# Patient Record
Sex: Female | Born: 1970 | Race: White | Hispanic: No | Marital: Married | Smoking: Never smoker
Health system: Southern US, Community
[De-identification: ages and names within clinical notes are randomized; demographics above are authoritative.]

## PROBLEM LIST (undated history)

## (undated) DIAGNOSIS — I340 Nonrheumatic mitral (valve) insufficiency: Secondary | ICD-10-CM

## (undated) DIAGNOSIS — K5900 Constipation, unspecified: Secondary | ICD-10-CM

## (undated) DIAGNOSIS — M7989 Other specified soft tissue disorders: Secondary | ICD-10-CM

## (undated) DIAGNOSIS — K219 Gastro-esophageal reflux disease without esophagitis: Secondary | ICD-10-CM

## (undated) DIAGNOSIS — R0602 Shortness of breath: Secondary | ICD-10-CM

## (undated) DIAGNOSIS — R0989 Other specified symptoms and signs involving the circulatory and respiratory systems: Secondary | ICD-10-CM

## (undated) DIAGNOSIS — C801 Malignant (primary) neoplasm, unspecified: Secondary | ICD-10-CM

## (undated) DIAGNOSIS — R0982 Postnasal drip: Secondary | ICD-10-CM

## (undated) DIAGNOSIS — I219 Acute myocardial infarction, unspecified: Secondary | ICD-10-CM

## (undated) DIAGNOSIS — G3781 Myelin oligodendrocyte glycoprotein antibody disease: Secondary | ICD-10-CM

## (undated) DIAGNOSIS — N979 Female infertility, unspecified: Secondary | ICD-10-CM

## (undated) DIAGNOSIS — H9190 Unspecified hearing loss, unspecified ear: Secondary | ICD-10-CM

## (undated) DIAGNOSIS — R609 Edema, unspecified: Secondary | ICD-10-CM

## (undated) DIAGNOSIS — I890 Lymphedema, not elsewhere classified: Secondary | ICD-10-CM

## (undated) DIAGNOSIS — H469 Unspecified optic neuritis: Secondary | ICD-10-CM

## (undated) DIAGNOSIS — M2550 Pain in unspecified joint: Secondary | ICD-10-CM

## (undated) DIAGNOSIS — E063 Autoimmune thyroiditis: Secondary | ICD-10-CM

## (undated) DIAGNOSIS — G378 Other specified demyelinating diseases of central nervous system: Secondary | ICD-10-CM

## (undated) DIAGNOSIS — Z8719 Personal history of other diseases of the digestive system: Secondary | ICD-10-CM

## (undated) DIAGNOSIS — F32A Depression, unspecified: Secondary | ICD-10-CM

## (undated) DIAGNOSIS — R12 Heartburn: Secondary | ICD-10-CM

## (undated) DIAGNOSIS — E039 Hypothyroidism, unspecified: Secondary | ICD-10-CM

## (undated) DIAGNOSIS — T7840XA Allergy, unspecified, initial encounter: Secondary | ICD-10-CM

## (undated) DIAGNOSIS — Z923 Personal history of irradiation: Secondary | ICD-10-CM

## (undated) DIAGNOSIS — Z973 Presence of spectacles and contact lenses: Secondary | ICD-10-CM

## (undated) DIAGNOSIS — Z9889 Other specified postprocedural states: Secondary | ICD-10-CM

## (undated) DIAGNOSIS — E538 Deficiency of other specified B group vitamins: Secondary | ICD-10-CM

## (undated) DIAGNOSIS — M549 Dorsalgia, unspecified: Secondary | ICD-10-CM

## (undated) DIAGNOSIS — R112 Nausea with vomiting, unspecified: Secondary | ICD-10-CM

## (undated) DIAGNOSIS — R1310 Dysphagia, unspecified: Secondary | ICD-10-CM

## (undated) DIAGNOSIS — C50919 Malignant neoplasm of unspecified site of unspecified female breast: Secondary | ICD-10-CM

## (undated) HISTORY — DX: Other specified postprocedural states: Z98.890

## (undated) HISTORY — PX: BREAST LUMPECTOMY: SHX2

## (undated) HISTORY — DX: Heartburn: R12

## (undated) HISTORY — PX: ABDOMINAL HYSTERECTOMY: SHX81

## (undated) HISTORY — DX: Deficiency of other specified B group vitamins: E53.8

## (undated) HISTORY — DX: Dysphagia, unspecified: R13.10

## (undated) HISTORY — DX: Autoimmune thyroiditis: E06.3

## (undated) HISTORY — DX: Depression, unspecified: F32.A

## (undated) HISTORY — DX: Nonrheumatic mitral (valve) insufficiency: I34.0

## (undated) HISTORY — DX: Shortness of breath: R06.02

## (undated) HISTORY — DX: Unspecified hearing loss, unspecified ear: H91.90

## (undated) HISTORY — DX: Other specified soft tissue disorders: M79.89

## (undated) HISTORY — DX: Pain in unspecified joint: M25.50

## (undated) HISTORY — PX: WISDOM TOOTH EXTRACTION: SHX21

## (undated) HISTORY — DX: Gastro-esophageal reflux disease without esophagitis: K21.9

## (undated) HISTORY — DX: Dorsalgia, unspecified: M54.9

## (undated) HISTORY — DX: Postnasal drip: R09.82

## (undated) HISTORY — DX: Other specified symptoms and signs involving the circulatory and respiratory systems: R09.89

## (undated) HISTORY — DX: Nausea with vomiting, unspecified: R11.2

## (undated) HISTORY — PX: OTHER SURGICAL HISTORY: SHX169

## (undated) HISTORY — DX: Constipation, unspecified: K59.00

## (undated) HISTORY — DX: Female infertility, unspecified: N97.9

## (undated) HISTORY — PX: BREAST SURGERY: SHX581

## (undated) HISTORY — DX: Allergy, unspecified, initial encounter: T78.40XA

## (undated) HISTORY — DX: Acute myocardial infarction, unspecified: I21.9

---

## 2012-02-13 HISTORY — PX: TUBAL LIGATION: SHX77

## 2017-03-17 ENCOUNTER — Other Ambulatory Visit: Payer: Self-pay | Admitting: Internal Medicine

## 2017-03-17 DIAGNOSIS — Z1231 Encounter for screening mammogram for malignant neoplasm of breast: Secondary | ICD-10-CM

## 2017-04-22 ENCOUNTER — Ambulatory Visit
Admission: RE | Admit: 2017-04-22 | Discharge: 2017-04-22 | Disposition: A | Discharge status: Home / Self Care | Payer: PRIVATE HEALTH INSURANCE | Source: Ambulatory Visit | Attending: Internal Medicine | Admitting: Internal Medicine

## 2017-04-22 DIAGNOSIS — Z1231 Encounter for screening mammogram for malignant neoplasm of breast: Secondary | ICD-10-CM

## 2017-04-23 ENCOUNTER — Other Ambulatory Visit: Payer: Self-pay | Admitting: Internal Medicine

## 2017-04-23 DIAGNOSIS — R928 Other abnormal and inconclusive findings on diagnostic imaging of breast: Secondary | ICD-10-CM

## 2017-04-28 ENCOUNTER — Ambulatory Visit
Admission: RE | Admit: 2017-04-28 | Discharge: 2017-04-28 | Disposition: A | Discharge status: Home / Self Care | Payer: PRIVATE HEALTH INSURANCE | Source: Ambulatory Visit | Attending: Internal Medicine | Admitting: Internal Medicine

## 2017-04-28 ENCOUNTER — Ambulatory Visit: Payer: PRIVATE HEALTH INSURANCE

## 2017-04-28 DIAGNOSIS — R928 Other abnormal and inconclusive findings on diagnostic imaging of breast: Secondary | ICD-10-CM

## 2017-06-02 ENCOUNTER — Other Ambulatory Visit: Payer: Self-pay | Admitting: Internal Medicine

## 2017-06-02 ENCOUNTER — Other Ambulatory Visit (HOSPITAL_COMMUNITY)
Admission: RE | Admit: 2017-06-02 | Discharge: 2017-06-02 | Disposition: A | Discharge status: Home / Self Care | Payer: PRIVATE HEALTH INSURANCE | Source: Ambulatory Visit | Attending: Internal Medicine | Admitting: Internal Medicine

## 2017-06-02 DIAGNOSIS — Z124 Encounter for screening for malignant neoplasm of cervix: Secondary | ICD-10-CM | POA: Diagnosis present

## 2017-06-04 LAB — CYTOLOGY - PAP
Chlamydia: NEGATIVE
DIAGNOSIS: NEGATIVE
Neisseria Gonorrhea: NEGATIVE

## 2018-04-15 ENCOUNTER — Other Ambulatory Visit: Payer: Self-pay | Admitting: Internal Medicine

## 2018-04-15 DIAGNOSIS — Z1231 Encounter for screening mammogram for malignant neoplasm of breast: Secondary | ICD-10-CM

## 2018-05-13 ENCOUNTER — Ambulatory Visit
Admission: RE | Admit: 2018-05-13 | Discharge: 2018-05-13 | Disposition: A | Discharge status: Home / Self Care | Payer: PRIVATE HEALTH INSURANCE | Source: Ambulatory Visit | Attending: Internal Medicine | Admitting: Internal Medicine

## 2018-05-13 DIAGNOSIS — Z1231 Encounter for screening mammogram for malignant neoplasm of breast: Secondary | ICD-10-CM

## 2018-05-13 IMAGING — MG DIGITAL SCREENING BILATERAL MAMMOGRAM WITH TOMO AND CAD
8 series · 9 of 24 positions shown · non-contrast
Comparison: Previous exam(s).

CLINICAL DATA: Screening.

EXAM:
DIGITAL SCREENING BILATERAL MAMMOGRAM WITH TOMO AND CAD

[R CC synth-2D]
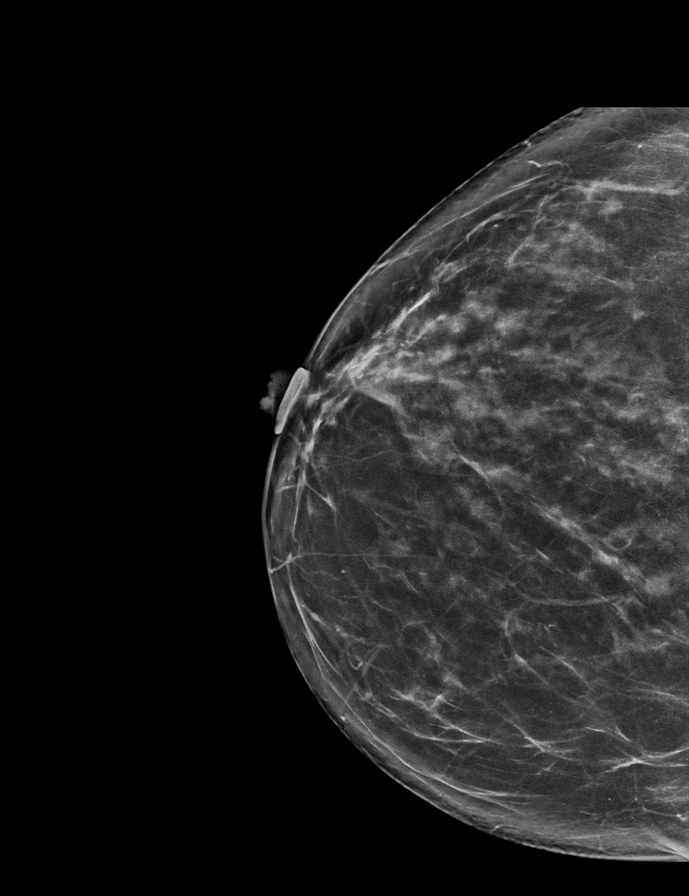

[R MLO synth-2D]
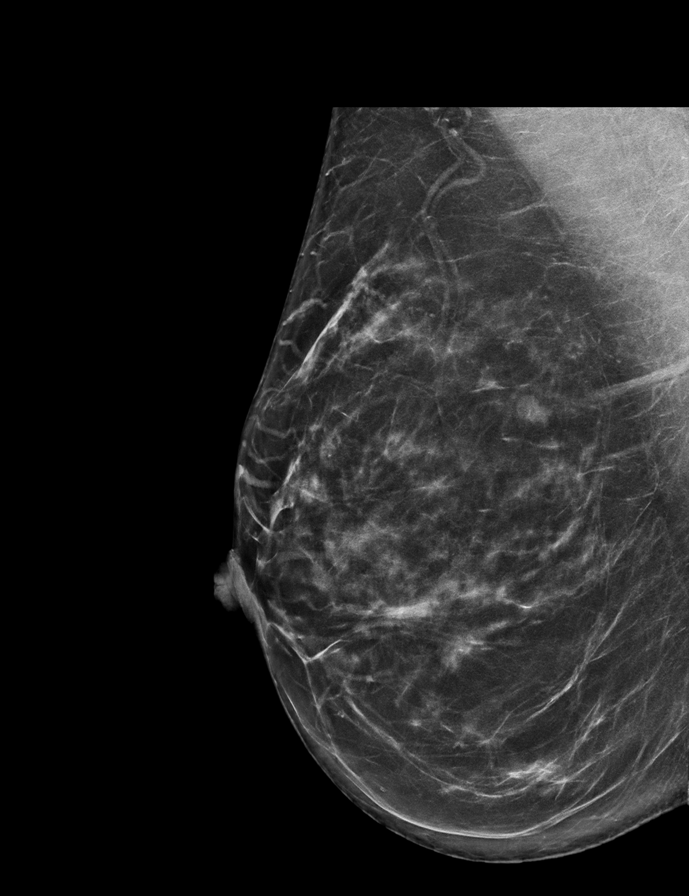

[L MLO synth-2D]
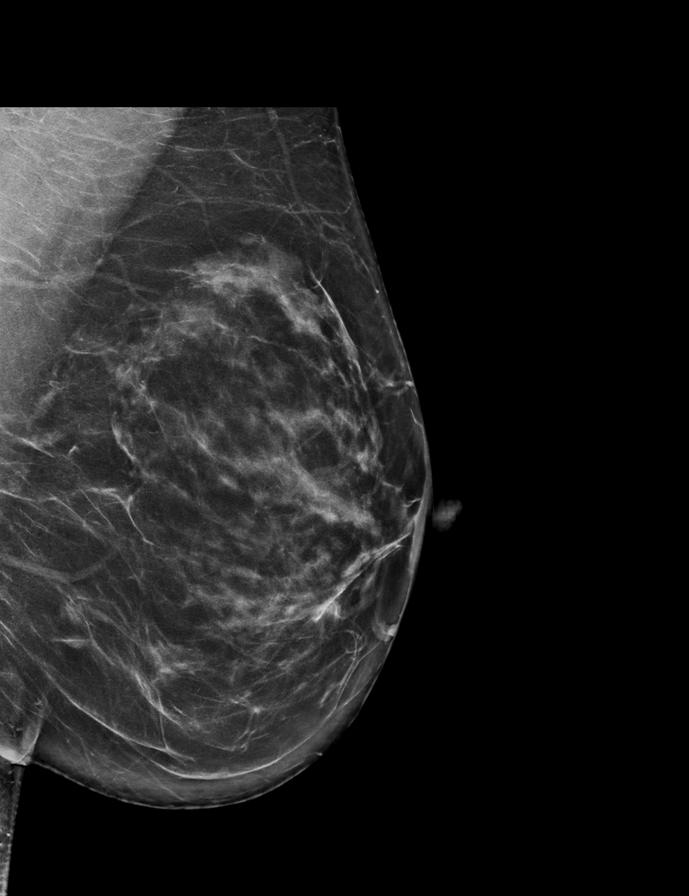

[L CC synth-2D]
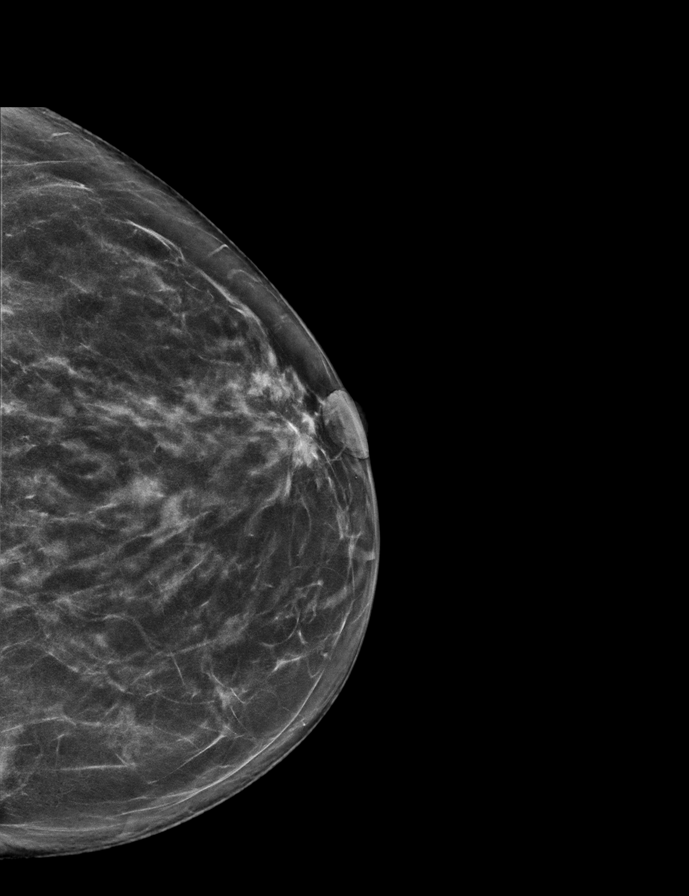

[L CC tomo · 2 of 66 frames shown]
[frame 22/66]
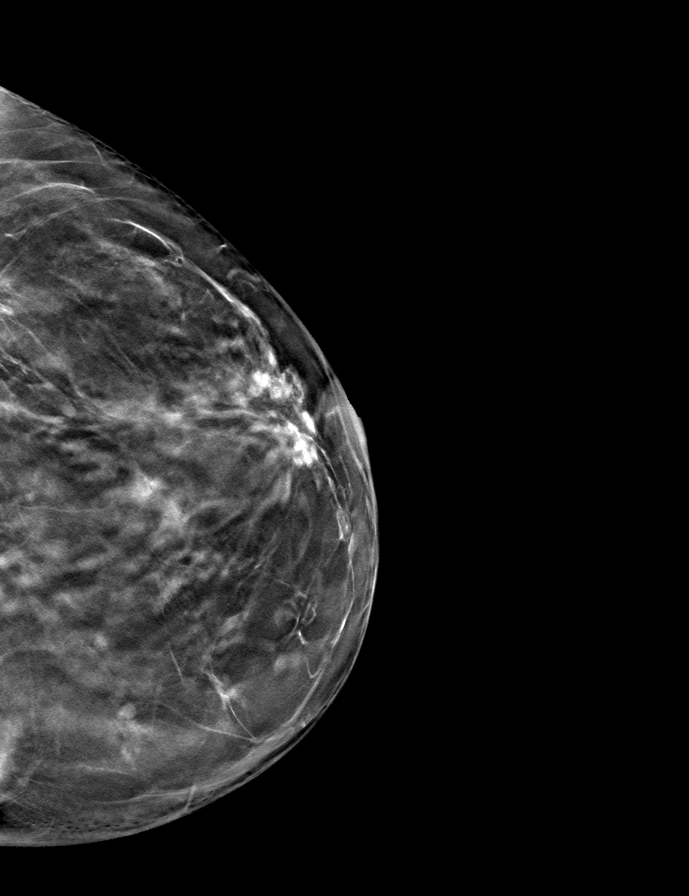
[frame 33/66]
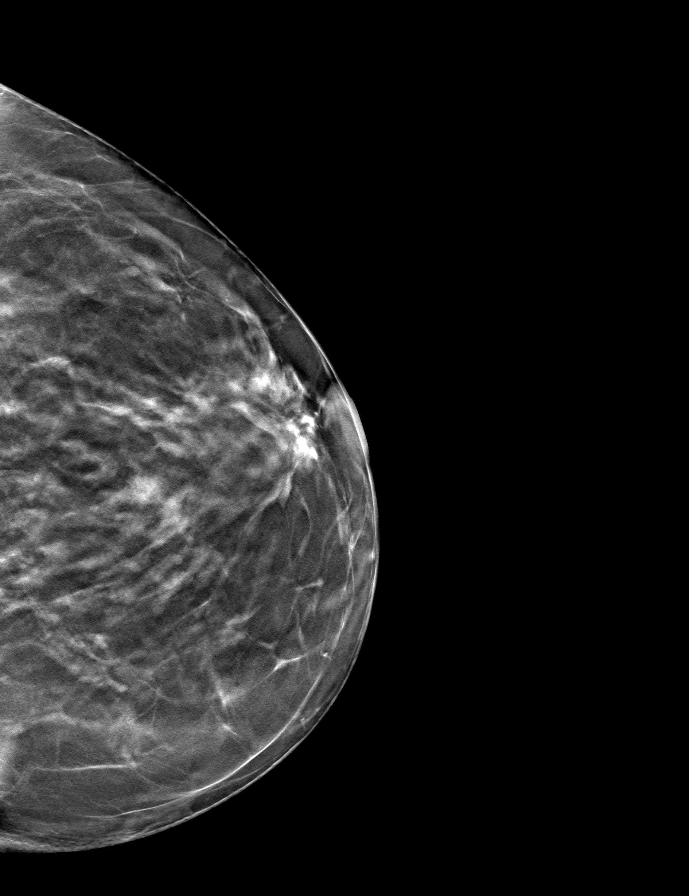

[L MLO tomo · tomo slice 39/76.0]
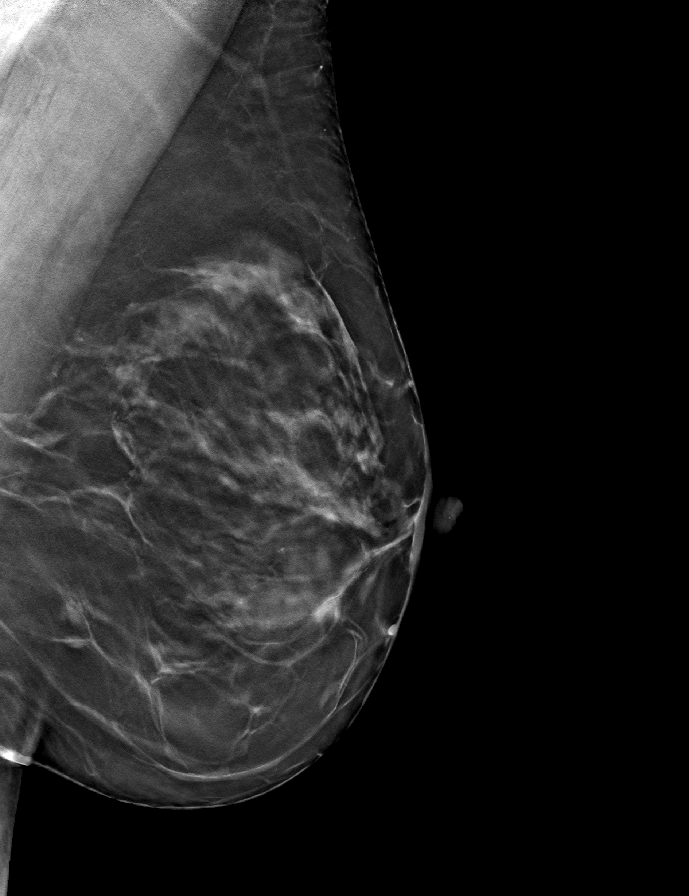

[R CC tomo · tomo slice 35/70.0]
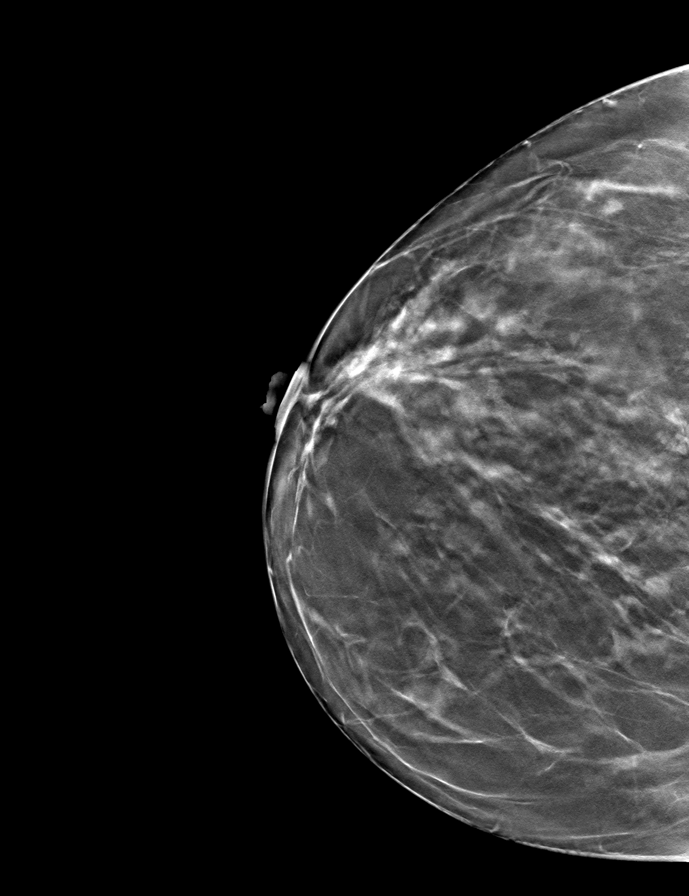

[R MLO tomo · tomo slice 39/78.0]
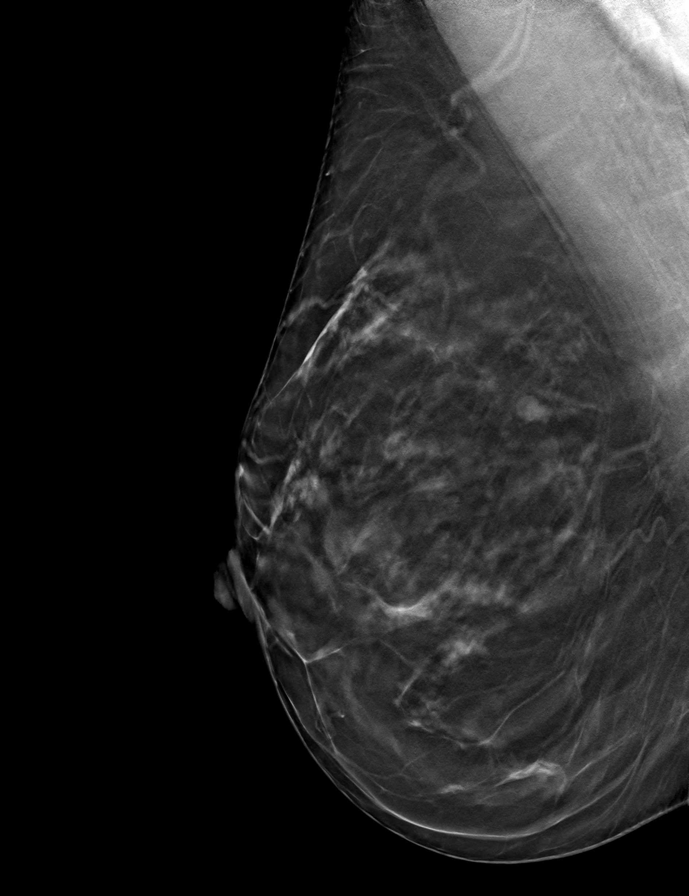

[9 of 24 positions shown; findings below may reference images not displayed]

ACR Breast Density Category b: There are scattered areas of
fibroglandular density.
FINDINGS: There are no findings suspicious for malignancy. Images were
processed with CAD.
IMPRESSION: No mammographic evidence of malignancy. A result letter of this
screening mammogram will be mailed directly to the patient.

RECOMMENDATION:
Screening mammogram in one year. (Code:[TQ])

BI-RADS CATEGORY  1: Negative.

## 2019-05-17 ENCOUNTER — Other Ambulatory Visit: Payer: Self-pay | Admitting: Internal Medicine

## 2019-05-17 DIAGNOSIS — Z1231 Encounter for screening mammogram for malignant neoplasm of breast: Secondary | ICD-10-CM

## 2019-06-13 ENCOUNTER — Other Ambulatory Visit: Payer: Self-pay | Admitting: Internal Medicine

## 2019-06-13 ENCOUNTER — Other Ambulatory Visit (HOSPITAL_COMMUNITY)
Admission: RE | Admit: 2019-06-13 | Discharge: 2019-06-13 | Disposition: A | Discharge status: Home / Self Care | Payer: PRIVATE HEALTH INSURANCE | Source: Ambulatory Visit | Attending: Internal Medicine | Admitting: Internal Medicine

## 2019-06-13 DIAGNOSIS — Z124 Encounter for screening for malignant neoplasm of cervix: Secondary | ICD-10-CM | POA: Diagnosis present

## 2019-06-13 DIAGNOSIS — Z113 Encounter for screening for infections with a predominantly sexual mode of transmission: Secondary | ICD-10-CM | POA: Diagnosis present

## 2019-06-15 LAB — MOLECULAR ANCILLARY ONLY
Chlamydia: NEGATIVE
Comment: NEGATIVE
Comment: NORMAL
Neisseria Gonorrhea: NEGATIVE

## 2019-06-16 LAB — CYTOLOGY - PAP
Adequacy: ABSENT
Comment: NEGATIVE
Diagnosis: NEGATIVE
High risk HPV: NEGATIVE

## 2019-06-17 ENCOUNTER — Ambulatory Visit
Admission: RE | Admit: 2019-06-17 | Discharge: 2019-06-17 | Disposition: A | Discharge status: Home / Self Care | Payer: PRIVATE HEALTH INSURANCE | Source: Ambulatory Visit | Attending: Internal Medicine | Admitting: Internal Medicine

## 2019-06-17 ENCOUNTER — Other Ambulatory Visit: Payer: Self-pay

## 2019-06-17 DIAGNOSIS — Z1231 Encounter for screening mammogram for malignant neoplasm of breast: Secondary | ICD-10-CM

## 2019-06-17 IMAGING — MG DIGITAL SCREENING BILAT W/ TOMO W/ CAD
8 series · 8 of 24 positions shown · non-contrast
Comparison: Previous exam(s).

CLINICAL DATA: Screening.

EXAM:
DIGITAL SCREENING BILATERAL MAMMOGRAM WITH TOMO AND CAD

[R CC synth-2D]
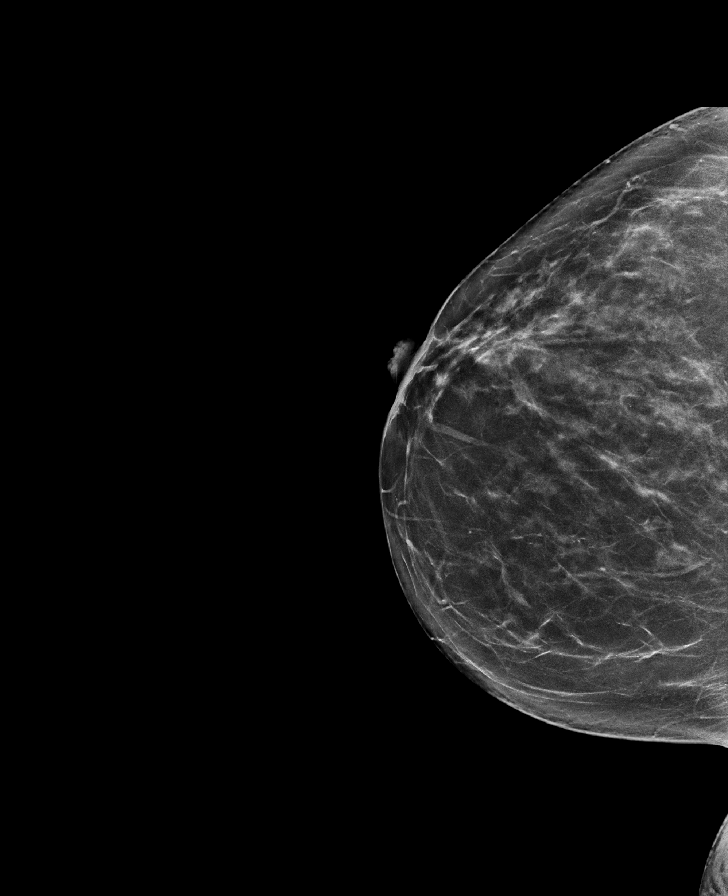

[L CC synth-2D]
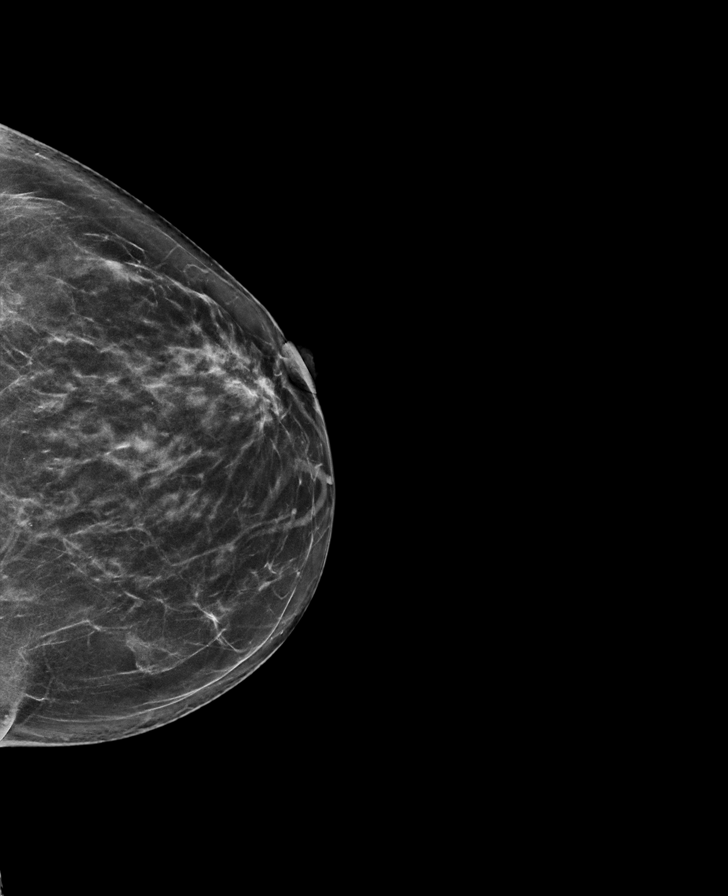

[L MLO synth-2D]
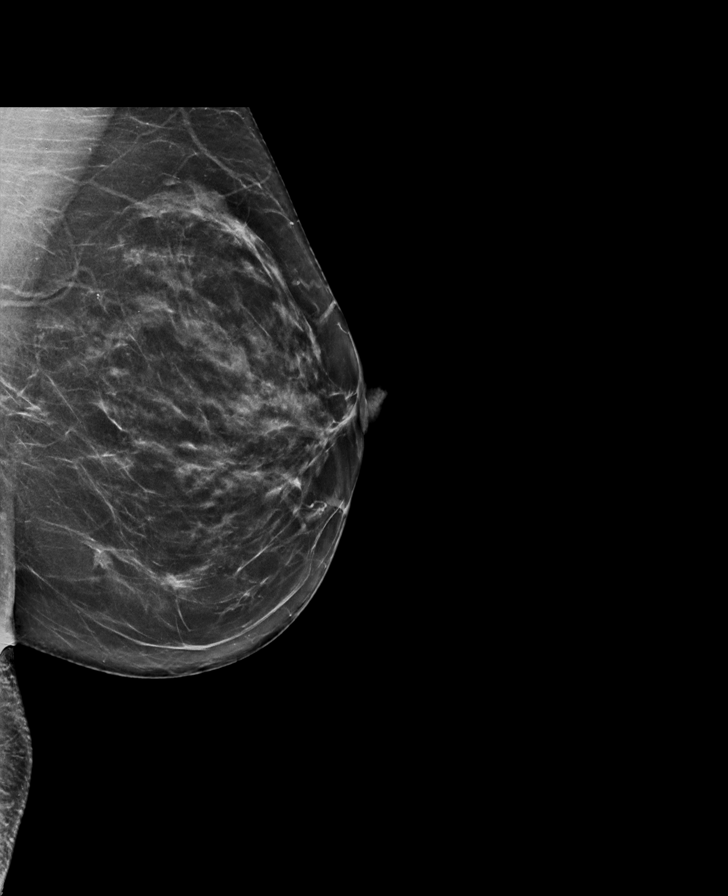

[R MLO synth-2D]
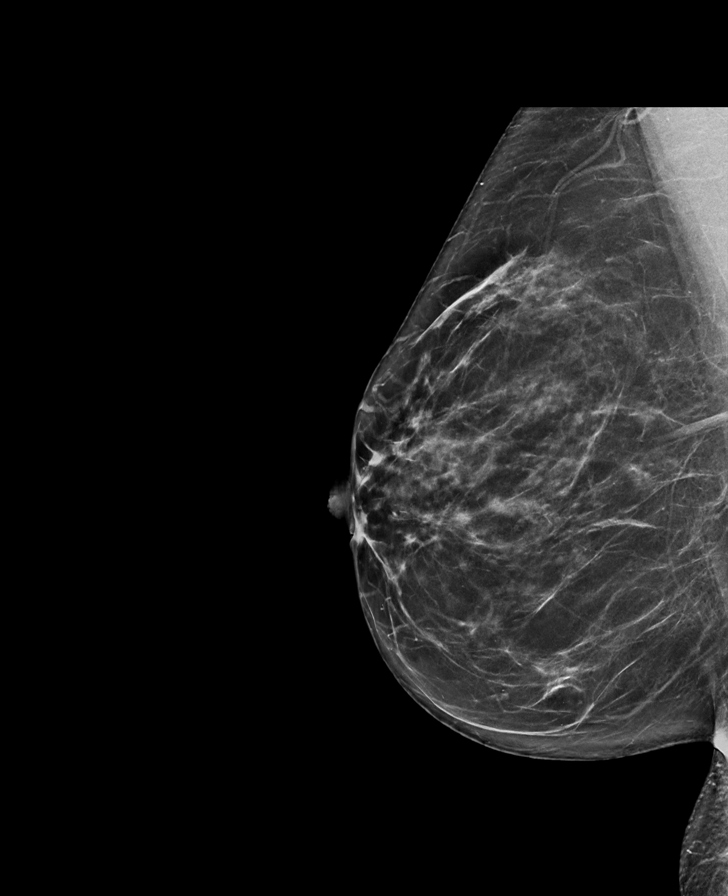

[L MLO tomo · tomo slice 36/71.0]
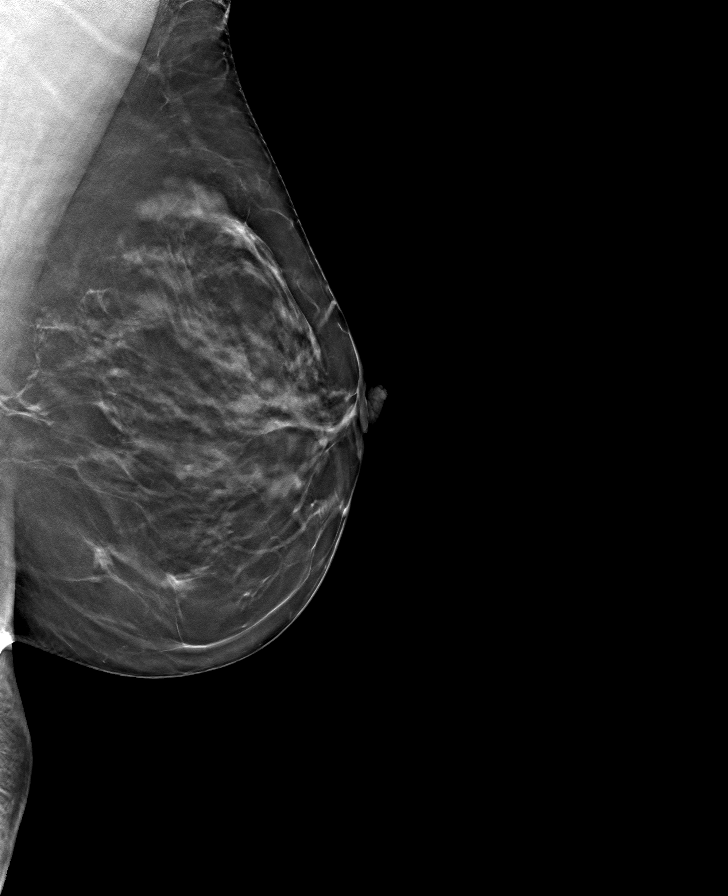

[R CC tomo · tomo slice 36/71.0]
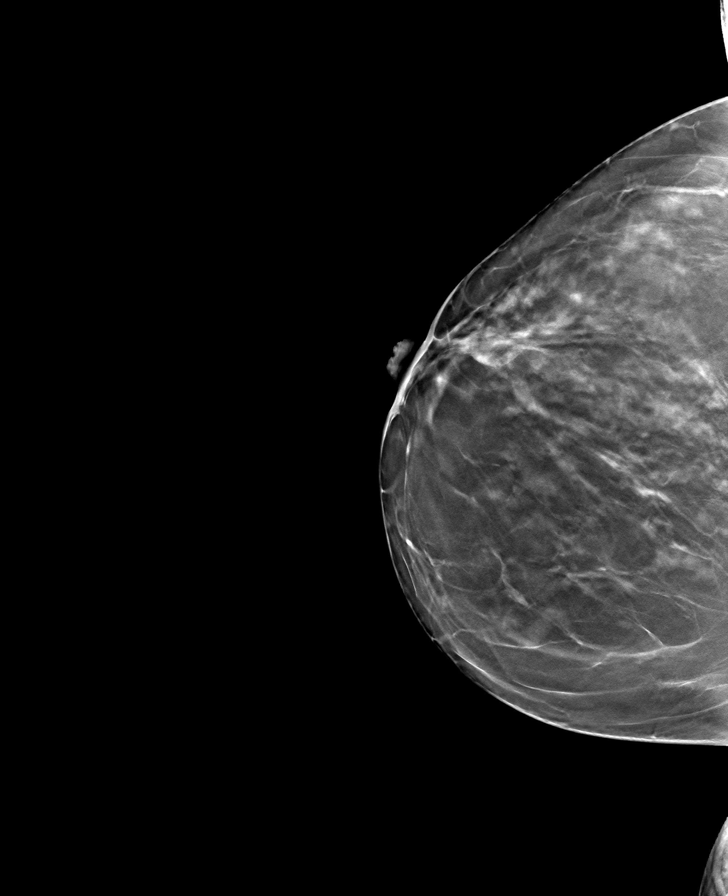

[R MLO tomo · tomo slice 39/76.0]
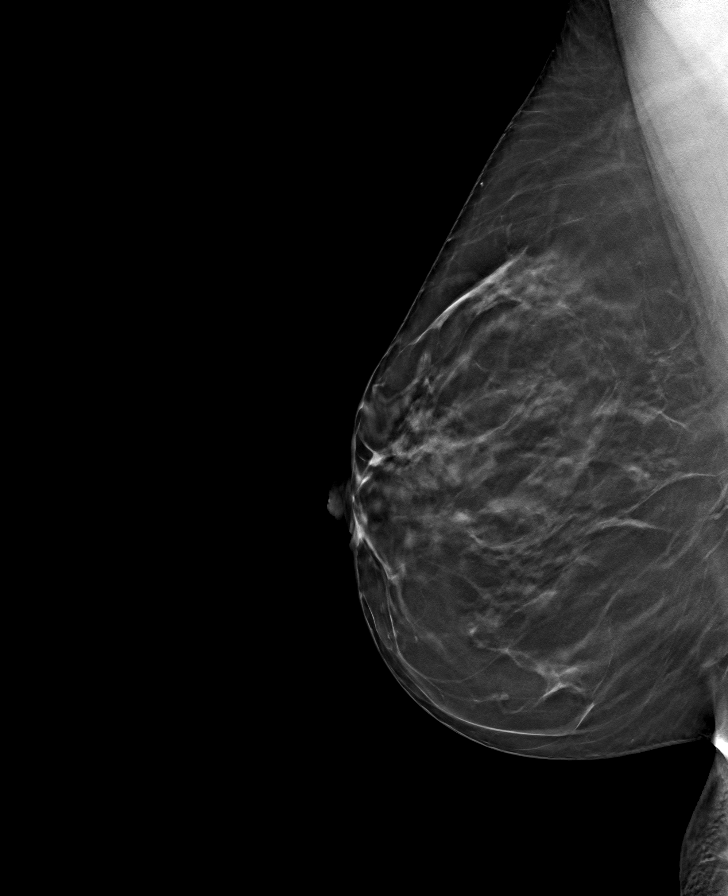

[L CC tomo · tomo slice 35/68.0]
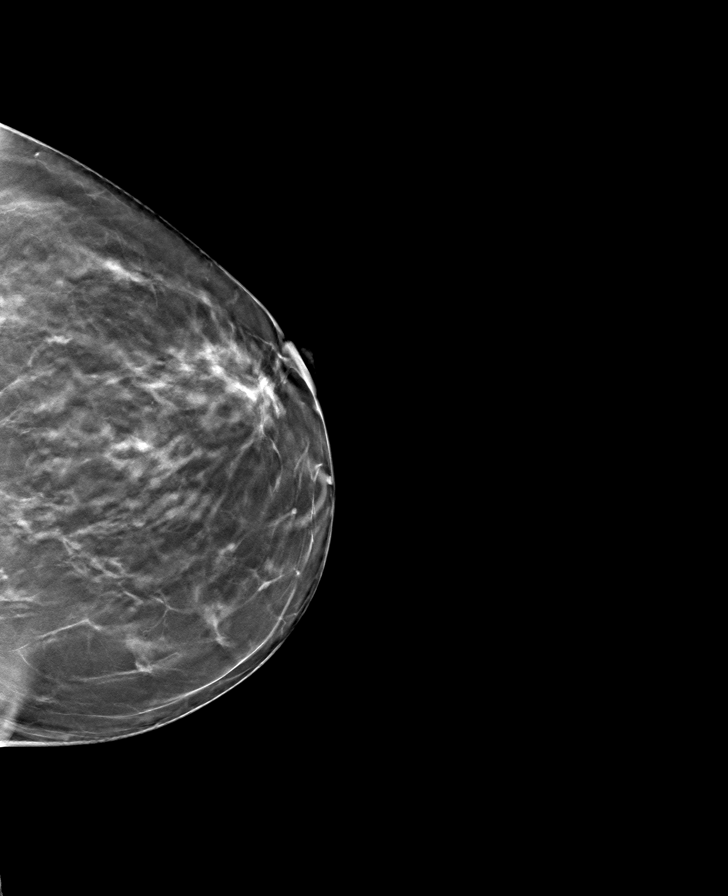

[8 of 24 positions shown; findings below may reference images not displayed]

ACR Breast Density Category b: There are scattered areas of
fibroglandular density.
FINDINGS: There are no findings suspicious for malignancy. Images were
processed with CAD.
IMPRESSION: No mammographic evidence of malignancy. A result letter of this
screening mammogram will be mailed directly to the patient.

RECOMMENDATION:
Screening mammogram in one year. (Code:[TQ])

BI-RADS CATEGORY  1: Negative.

## 2019-09-22 ENCOUNTER — Ambulatory Visit: Payer: PRIVATE HEALTH INSURANCE | Admitting: Registered"

## 2019-11-13 DIAGNOSIS — U071 COVID-19: Secondary | ICD-10-CM

## 2019-11-13 HISTORY — DX: COVID-19: U07.1

## 2020-04-14 DIAGNOSIS — Z923 Personal history of irradiation: Secondary | ICD-10-CM

## 2020-04-14 HISTORY — DX: Personal history of irradiation: Z92.3

## 2020-04-24 ENCOUNTER — Other Ambulatory Visit: Payer: Self-pay

## 2020-04-24 ENCOUNTER — Inpatient Hospital Stay (HOSPITAL_COMMUNITY): Payer: Commercial Managed Care - PPO

## 2020-04-24 ENCOUNTER — Inpatient Hospital Stay (HOSPITAL_COMMUNITY)
Admission: AD | Admit: 2020-04-24 | Discharge: 2020-04-29 | DRG: 123 | Disposition: A | Discharge status: Home / Self Care | Payer: Commercial Managed Care - PPO | Source: Ambulatory Visit | Attending: Internal Medicine | Admitting: Internal Medicine

## 2020-04-24 ENCOUNTER — Encounter (HOSPITAL_COMMUNITY): Payer: Self-pay | Admitting: Internal Medicine

## 2020-04-24 DIAGNOSIS — Z820 Family history of epilepsy and other diseases of the nervous system: Secondary | ICD-10-CM | POA: Diagnosis not present

## 2020-04-24 DIAGNOSIS — G35 Multiple sclerosis: Secondary | ICD-10-CM | POA: Diagnosis not present

## 2020-04-24 DIAGNOSIS — H469 Unspecified optic neuritis: Secondary | ICD-10-CM | POA: Diagnosis present

## 2020-04-24 DIAGNOSIS — Z8616 Personal history of COVID-19: Secondary | ICD-10-CM

## 2020-04-24 DIAGNOSIS — Z79899 Other long term (current) drug therapy: Secondary | ICD-10-CM

## 2020-04-24 DIAGNOSIS — Z79890 Hormone replacement therapy (postmenopausal): Secondary | ICD-10-CM | POA: Diagnosis not present

## 2020-04-24 DIAGNOSIS — R739 Hyperglycemia, unspecified: Secondary | ICD-10-CM | POA: Diagnosis present

## 2020-04-24 DIAGNOSIS — Z20822 Contact with and (suspected) exposure to covid-19: Secondary | ICD-10-CM | POA: Diagnosis present

## 2020-04-24 DIAGNOSIS — E039 Hypothyroidism, unspecified: Secondary | ICD-10-CM | POA: Diagnosis not present

## 2020-04-24 DIAGNOSIS — T380X5A Adverse effect of glucocorticoids and synthetic analogues, initial encounter: Secondary | ICD-10-CM | POA: Diagnosis present

## 2020-04-24 DIAGNOSIS — E063 Autoimmune thyroiditis: Secondary | ICD-10-CM | POA: Diagnosis present

## 2020-04-24 HISTORY — DX: Hypothyroidism, unspecified: E03.9

## 2020-04-24 LAB — HCG, SERUM, QUALITATIVE: Preg, Serum: NEGATIVE

## 2020-04-24 LAB — HIV ANTIBODY (ROUTINE TESTING W REFLEX): HIV Screen 4th Generation wRfx: NONREACTIVE

## 2020-04-24 LAB — SARS CORONAVIRUS 2 (TAT 6-24 HRS): SARS Coronavirus 2: NEGATIVE

## 2020-04-24 IMAGING — MR MR THORACIC SPINE WO/W CM
1 series · 6 of 11 positions shown · IV contrast (gadavist)
Comparison: None.

CLINICAL DATA: Right eye pain.  Concern for demyelinating disease.

EXAM:
MRI CERVICAL AND THORACIC SPINE WITHOUT AND WITH CONTRAST
TECHNIQUE: Multiplanar and multiecho pulse sequences of the cervical spine, to
include the craniocervical junction and cervicothoracic junction,
and the thoracic spine, were obtained without and with intravenous
contrast.
CONTRAST:  7.5mL GADAVIST GADOBUTROL 1 MMOL/ML IV SOLN

[Series 27: T1 · sagittal · 3.3mm · 0.56mm/px · 6 of 11 slices shown]
[im 1/11]
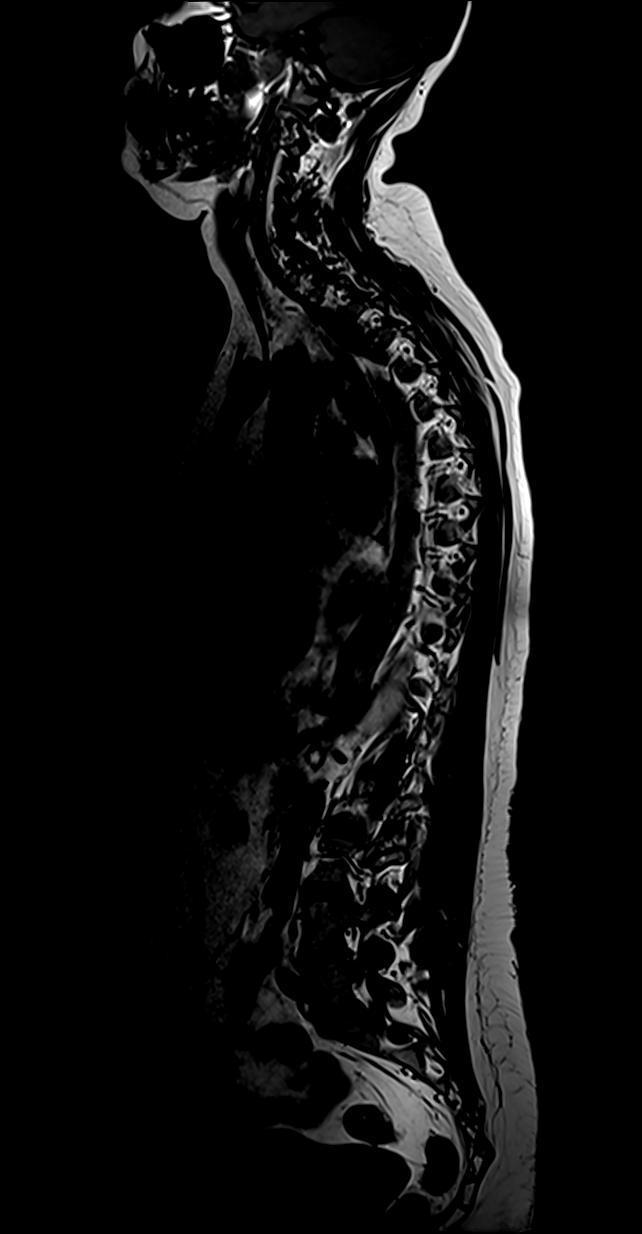
[im 2/11]
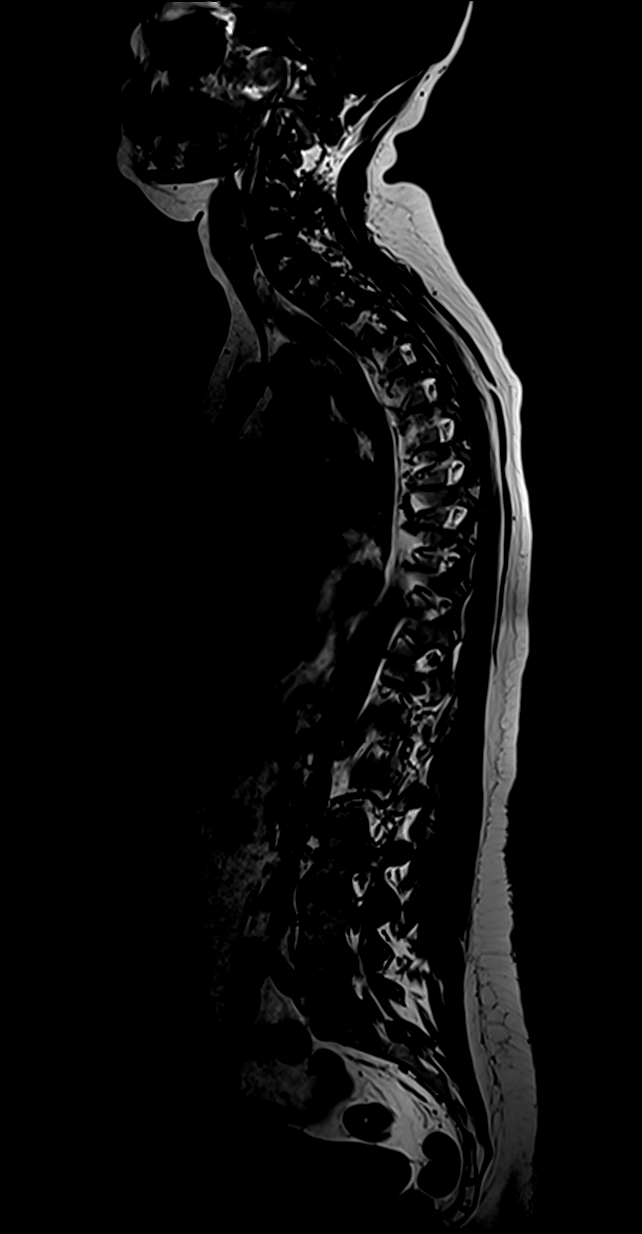
[im 3/11]
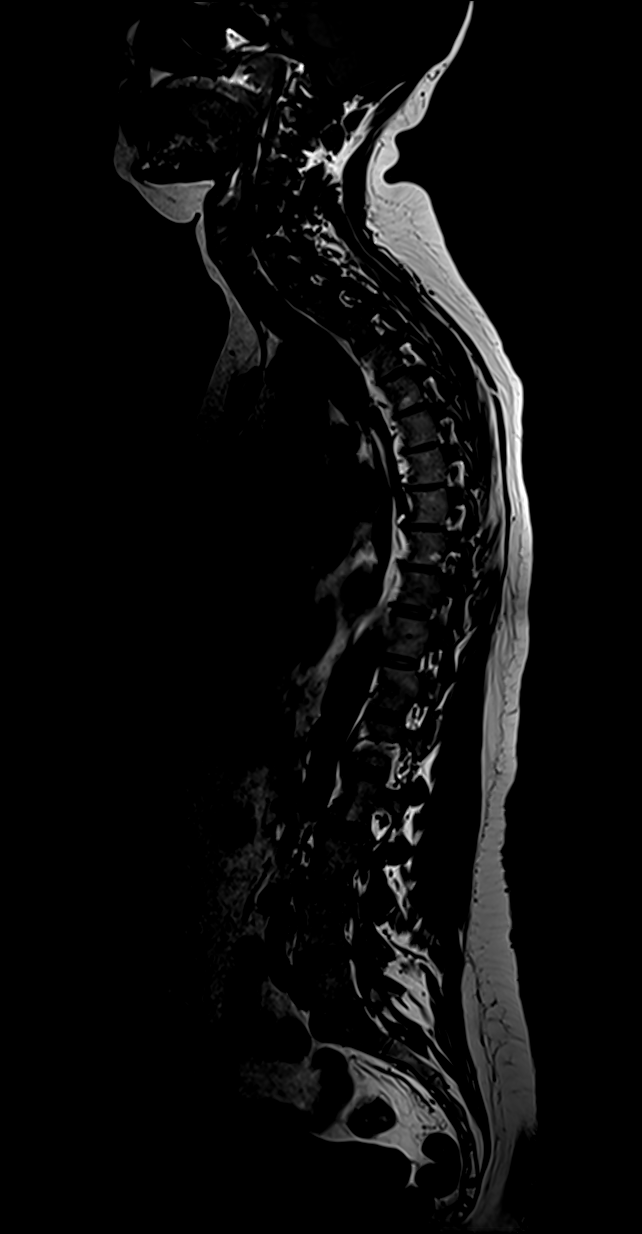
[im 4/11]
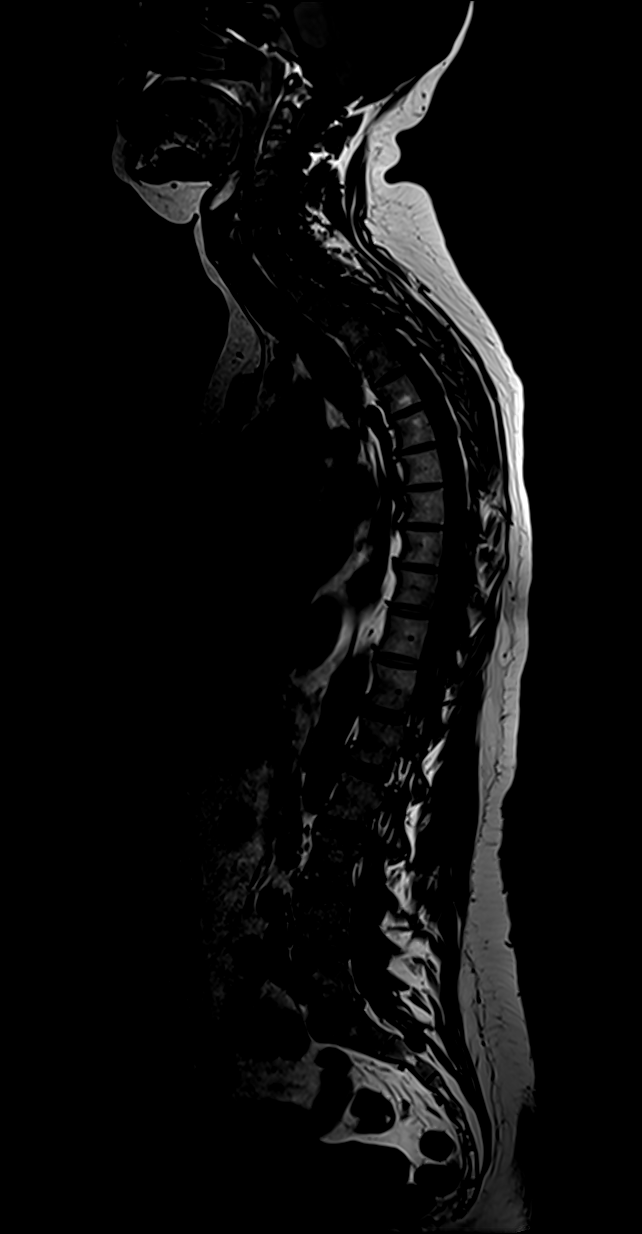
[im 6/11]
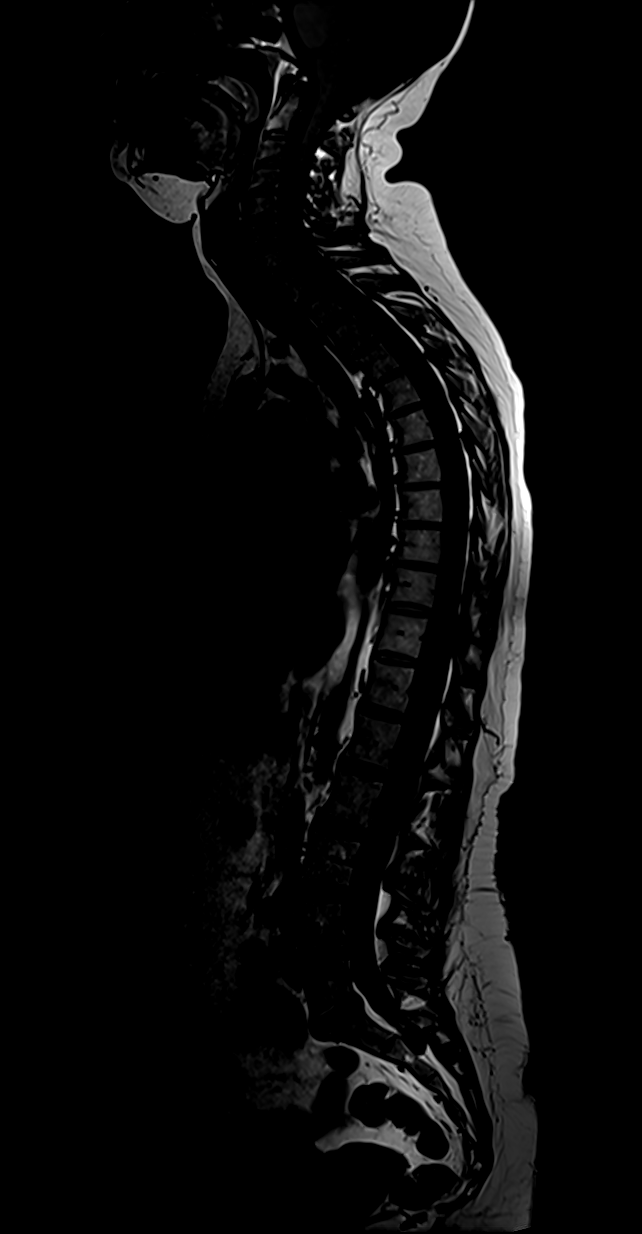
[im 10/11]
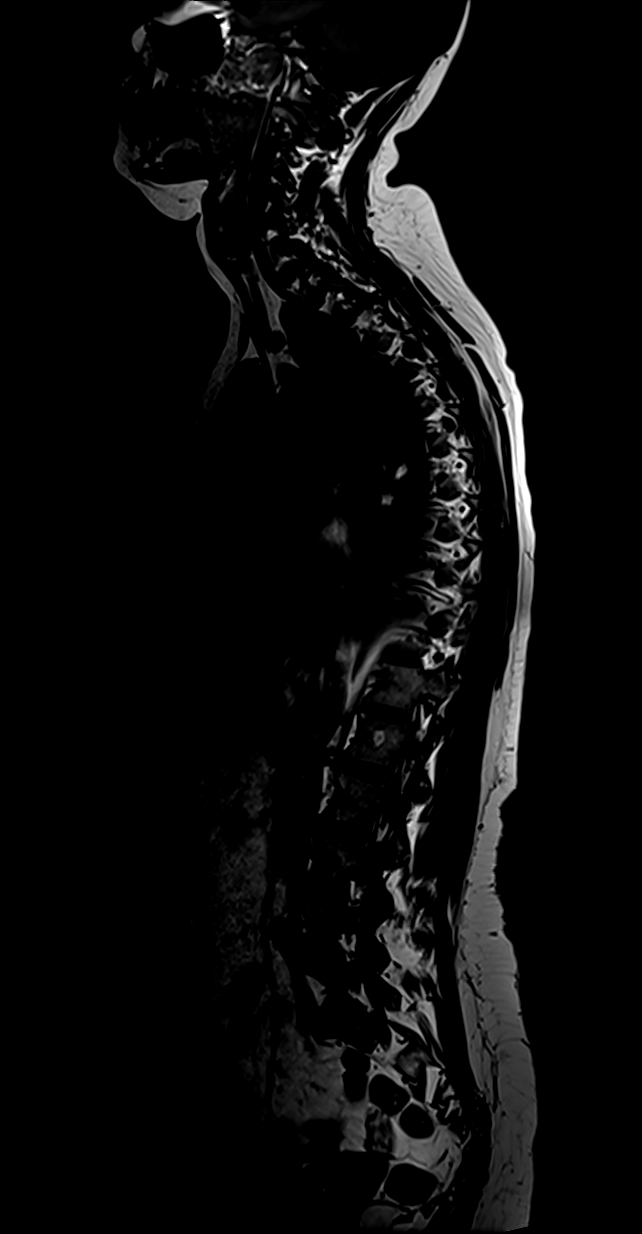

[6 of 11 positions shown; findings below may reference images not displayed]

FINDINGS: MRI CERVICAL SPINE FINDINGS

Alignment: Physiologic.

Vertebrae: No fracture, evidence of discitis, or bone lesion.

Cord: Normal signal and morphology. No abnormal contrast
enhancement.

Posterior Fossa, vertebral arteries, paraspinal tissues: Negative.

Disc levels:

C1-2: Unremarkable.

C2-3: Normal disc space and facet joints. There is no spinal canal
stenosis. No neural foraminal stenosis.

C3-4: Normal disc space and facet joints. There is no spinal canal
stenosis. No neural foraminal stenosis.

C4-5: Small disc bulge. There is no spinal canal stenosis. No neural
foraminal stenosis.

C5-6: Right asymmetric small disc bulge with right facet
hypertrophy. There is no spinal canal stenosis. Moderate right
neural foraminal stenosis.

C6-7: Small central disc protrusion. There is no spinal canal
stenosis. No neural foraminal stenosis.

C7-T1: Normal disc space and facet joints. There is no spinal canal
stenosis. No neural foraminal stenosis.

MRI THORACIC SPINE FINDINGS

Alignment:  Physiologic.

Vertebrae: No fracture, evidence of discitis, or bone lesion.

Cord: Normal signal and morphology. No abnormal contrast
enhancement.

Paraspinal and other soft tissues: Negative.

Disc levels:

No spinal canal stenosis.
IMPRESSION: 1. No evidence of demyelinating disease of the cervical or thoracic
spine.
2. Cervical degenerative disc disease without spinal canal stenosis.
3. Moderate right C5-6 neural foraminal stenosis.

## 2020-04-24 IMAGING — MR MR HEAD WO/W CM
18 of 22 series · 36 of 48 positions shown · IV contrast (gadavist)
Comparison: None.

CLINICAL DATA: Right eye pain and vision loss. Family history of
multiple sclerosis.

EXAM:
MRI HEAD AND ORBITS WITHOUT AND WITH CONTRAST
TECHNIQUE: Multiplanar, multiecho pulse sequences of the brain and surrounding
structures were obtained without and with intravenous contrast.
Multiplanar, multiecho pulse sequences of the orbits and surrounding
structures were obtained including fat saturation techniques, before
and after intravenous contrast administration.
CONTRAST:  7.5mL GADAVIST GADOBUTROL 1 MMOL/ML IV SOLN

[Series 5: DWI · axial · 3.0mm · 0.88mm/px · z∈[-23,+130]mm · 7 of 104 slices shown (1 of 4)]
[im 1/104]
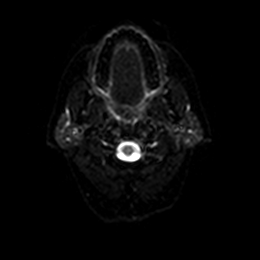
[im 18/104]
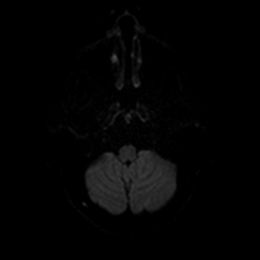
[im 35/104]
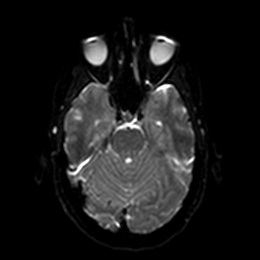
[im 52/104]
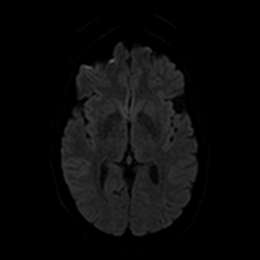
[im 69/104]
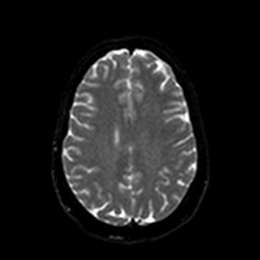
[im 86/104]
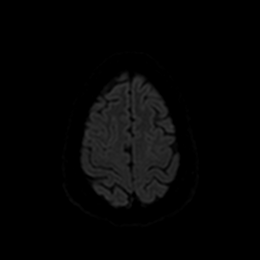
[im 104/104]
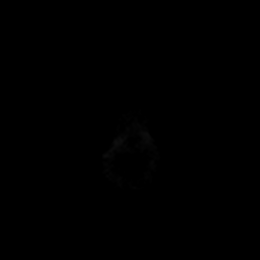

[Series 6: DWI · axial · 3.0mm · 0.88mm/px · z∈[-23,+130]mm · 3 of 51 slices shown (2 of 4)]
[im 1/51]
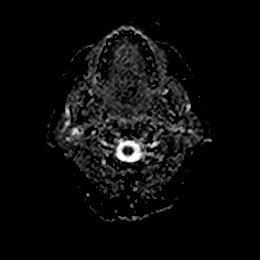
[im 26/51]
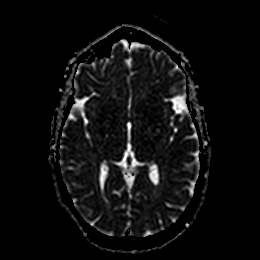
[im 51/51]
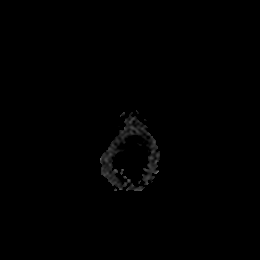

[Series 7: DWI · coronal · 4.0mm · 0.88mm/px · 4 of 72 slices shown (3 of 4)]
[im 1/72]
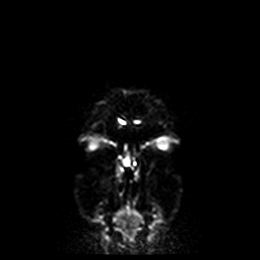
[im 24/72]
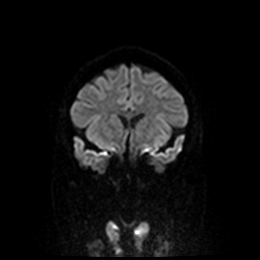
[im 48/72]
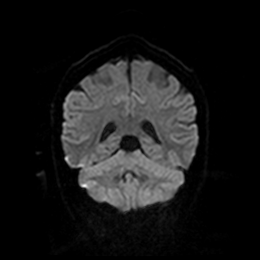
[im 72/72]
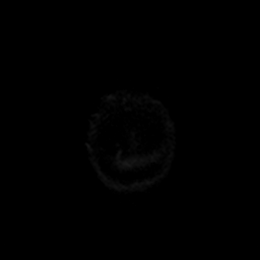

[Series 8: DWI · coronal · 4.0mm · 0.88mm/px · 2 of 36 slices shown (4 of 4)]
[im 1/36]
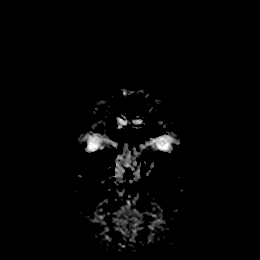
[im 36/36]
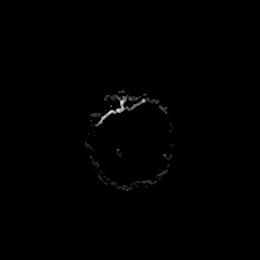

[Series 9: T1 · sagittal · 5.0mm · 0.75mm/px · 1 of 25 slices shown (1 of 3)]
[im 1/25]
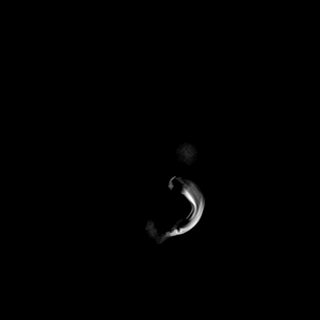

[Series 10: T2 · axial · 5.0mm · 0.72mm/px · 1 of 25 slices shown]
[im 1/25]
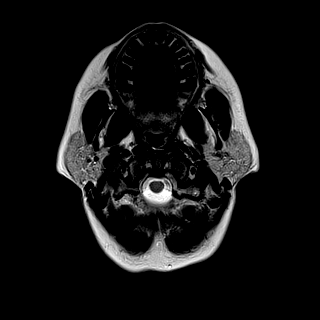

[Series 11: FLAIR · axial · 5.0mm · 0.45mm/px · 1 of 25 slices shown]
[im 1/25]
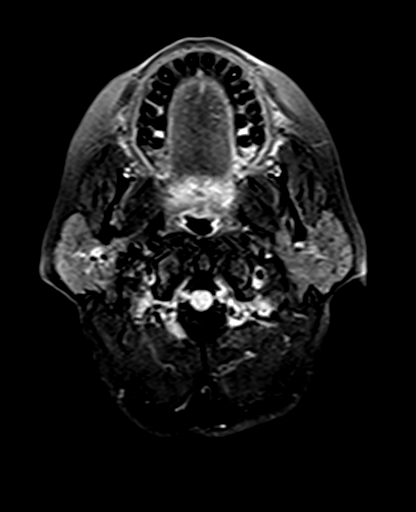

[Series 18: T1 · axial · non-contrast · 3.0mm · 0.37mm/px · 1 of 25 slices shown (2 of 3)]
[im 1/25]
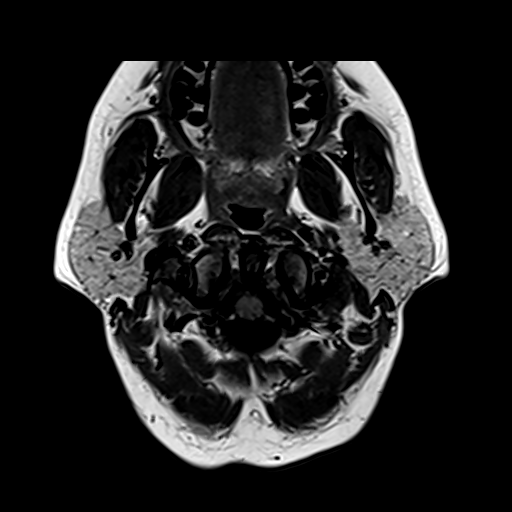

[Series 19: T2 fat-sat · axial · 3.0mm · 0.54mm/px · 1 of 25 slices shown (1 of 4)]
[im 1/25]
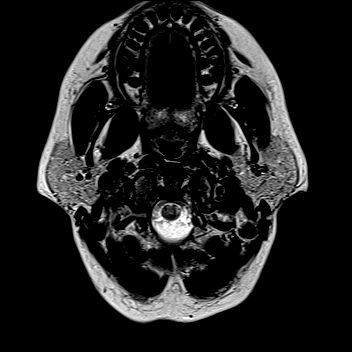

[Series 21: T2 fat-sat · axial · 3.0mm · 0.54mm/px · 1 of 25 slices shown (2 of 4)]
[im 1/25]
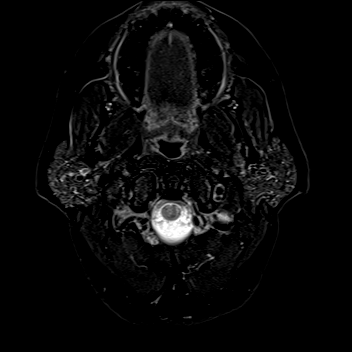

[Series 22: T2 fat-sat · coronal · 3.0mm · 0.54mm/px · 2 of 29 slices shown (3 of 4)]
[im 1/29]
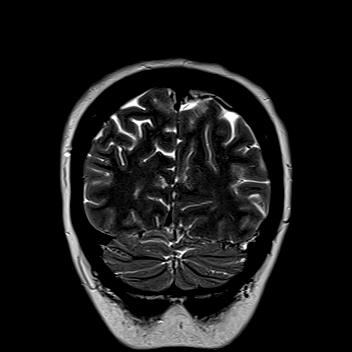
[im 29/29]
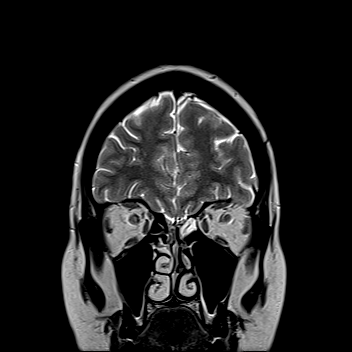

[Series 24: T2 fat-sat · coronal · 3.0mm · 0.54mm/px · 2 of 29 slices shown (4 of 4)]
[im 1/29]
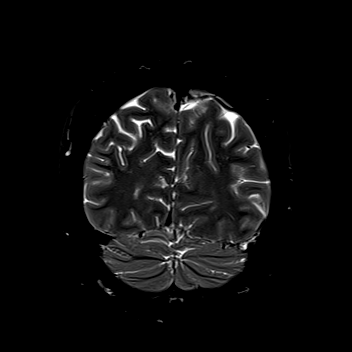
[im 29/29]
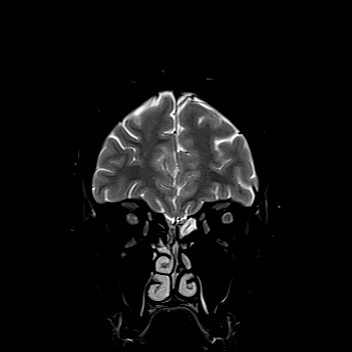

[Series 25: T1 · coronal · 3.0mm · 0.37mm/px · 2 of 29 slices shown (3 of 3)]
[im 1/29]
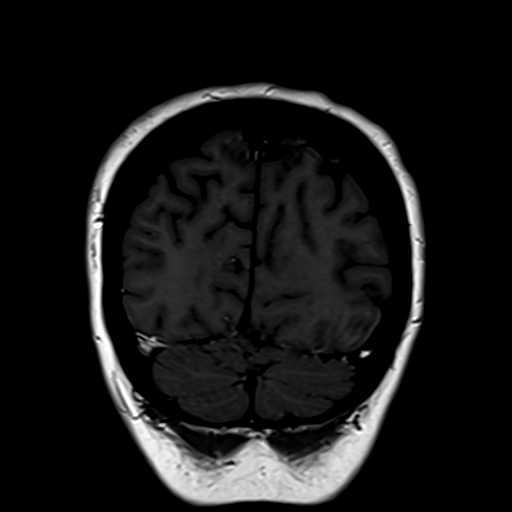
[im 29/29]
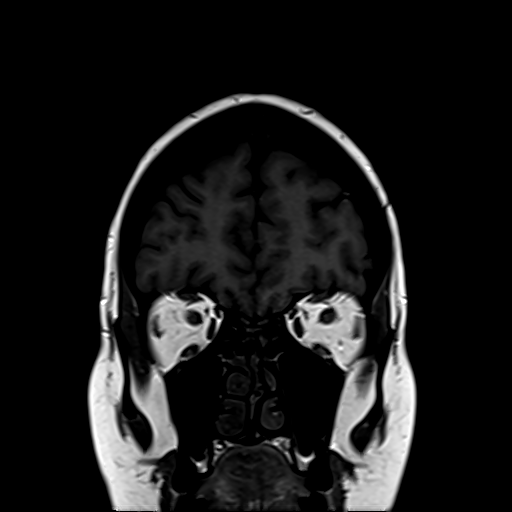

[Series 26: T1 fat-sat post-contrast · axial · 3.0mm · 0.37mm/px · 1 of 25 slices shown (1 of 2)]
[im 1/25]
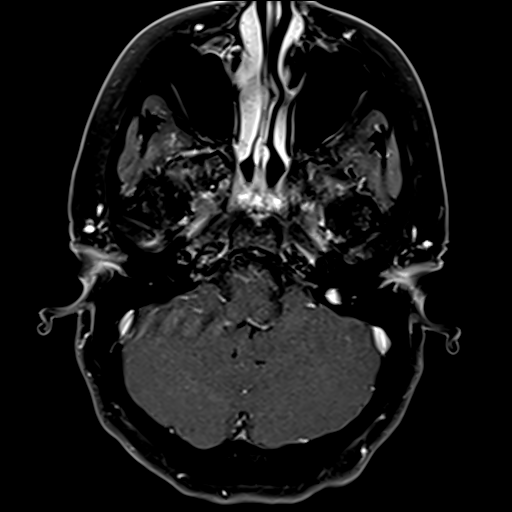

[Series 27: T1 fat-sat post-contrast · coronal · 3.0mm · 0.37mm/px · 2 of 29 slices shown (2 of 2)]
[im 1/29]
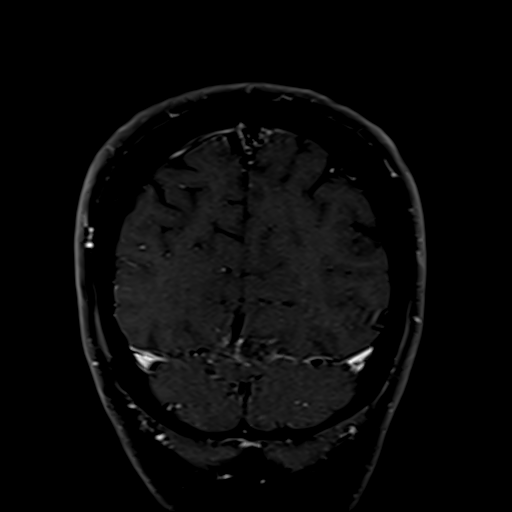
[im 29/29]
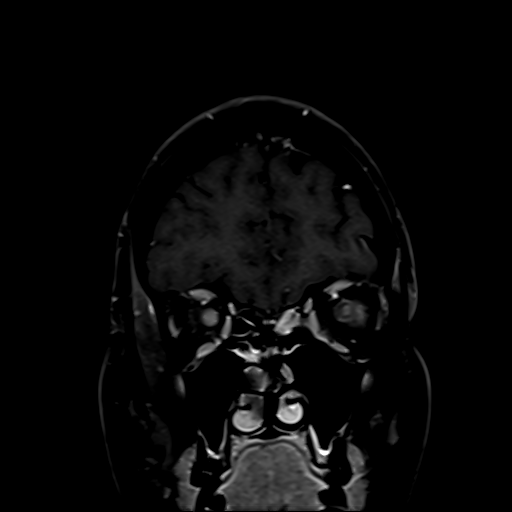

[Series 28: T2 post-contrast · coronal · 5.0mm · 0.72mm/px · 2 of 30 slices shown]
[im 1/30]
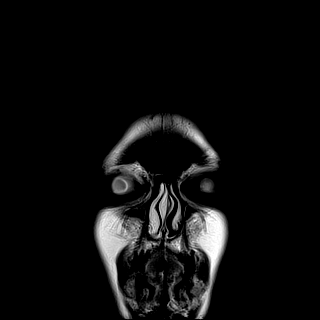
[im 30/30]
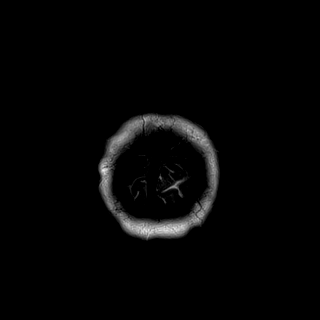

[Series 30: T1 post-contrast · coronal · 5.0mm · 0.34mm/px · 2 of 30 slices shown (1 of 2)]
[im 1/30]
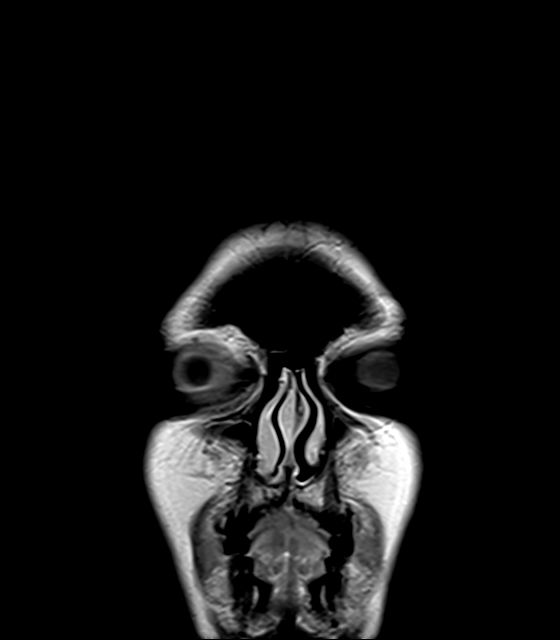
[im 30/30]
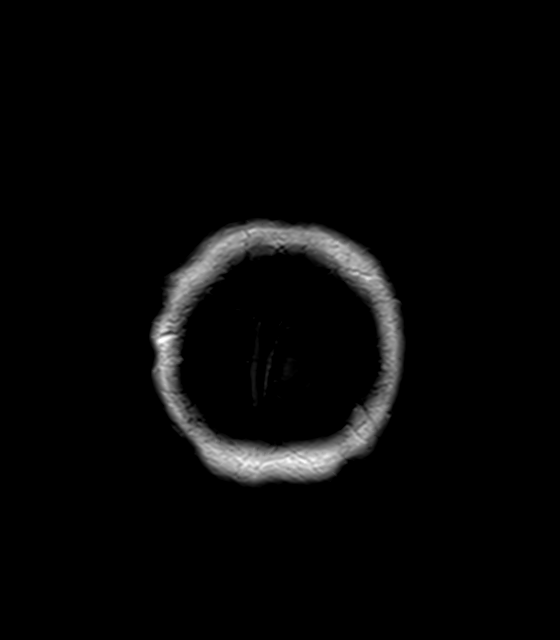

[Series 31: T1 post-contrast · sagittal · 5.0mm · 0.75mm/px · 1 of 25 slices shown (2 of 2)]
[im 1/25]
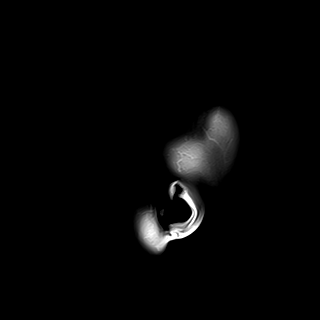

[36 of 48 positions shown; findings below may reference images not displayed]

FINDINGS: MRI HEAD FINDINGS

Brain: No acute infarct, mass effect or extra-axial collection. No
acute or chronic hemorrhage. Normal white matter signal, parenchymal
volume and CSF spaces. The midline structures are normal. Prominent
perivascular space of the left lentiform nucleus.

Vascular: Major flow voids are preserved.

Skull and upper cervical spine: Normal calvarium and skull base.
Visualized upper cervical spine and soft tissues are normal.

MRI ORBITS FINDINGS

Orbits: There is abnormal contrast enhancement of the right optic
nerve. No other orbital abnormality. The abnormal enhancement
involves only the intraorbital segment of the nerve.

Visualized sinuses: Minimal left ethmoid mucosal thickening.

Soft tissues: Negative
IMPRESSION: 1. Abnormal contrast enhancement of the intraorbital right optic
nerve, consistent with optic neuritis.
2. Normal MRI of the brain.  No evidence of demyelinating disease.

## 2020-04-24 IMAGING — MR MR THORACIC SPINE WO/W CM
6 of 10 series · 26 of 48 positions shown · IV contrast (gadavist)
Comparison: None.

CLINICAL DATA: Right eye pain.  Concern for demyelinating disease.

EXAM:
MRI CERVICAL AND THORACIC SPINE WITHOUT AND WITH CONTRAST
TECHNIQUE: Multiplanar and multiecho pulse sequences of the cervical spine, to
include the craniocervical junction and cervicothoracic junction,
and the thoracic spine, were obtained without and with intravenous
contrast.
CONTRAST:  7.5mL GADAVIST GADOBUTROL 1 MMOL/ML IV SOLN

[Series 9: T1 · sagittal · 5.0mm · 0.75mm/px · 6 of 25 slices shown (1 of 4)]
[im 1/25]
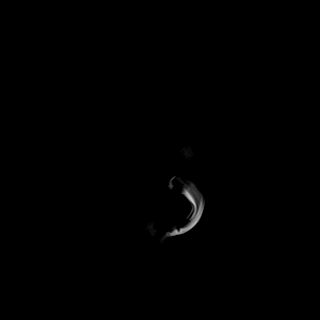
[im 5/25]
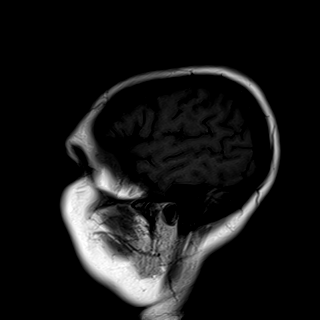
[im 10/25]
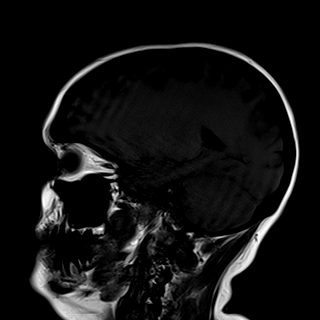
[im 15/25]
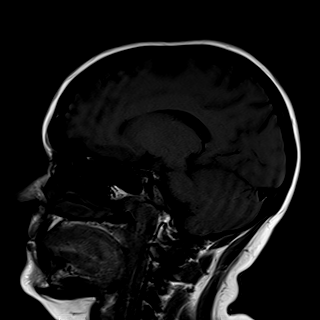
[im 20/25]
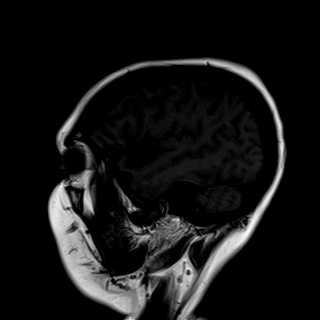
[im 25/25]
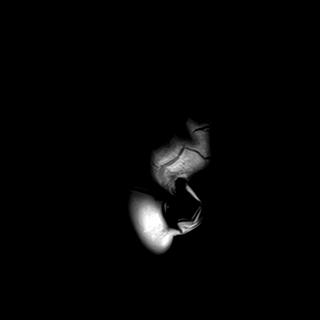

[Series 10: T1 · sagittal · 3.0mm · 0.56mm/px · 2 of 11 slices shown (2 of 4)]
[im 1/11]
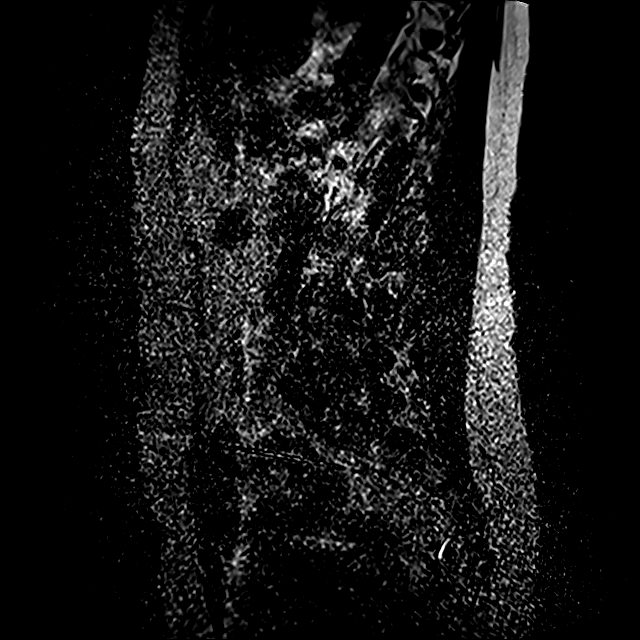
[im 11/11]
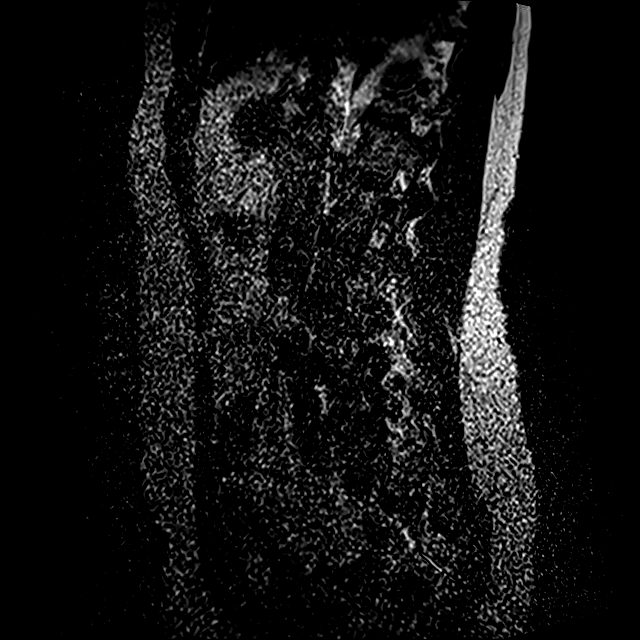

[Series 11: T2 · sagittal · 3.0mm · 0.75mm/px · 3 of 15 slices shown (1 of 2)]
[im 1/15]
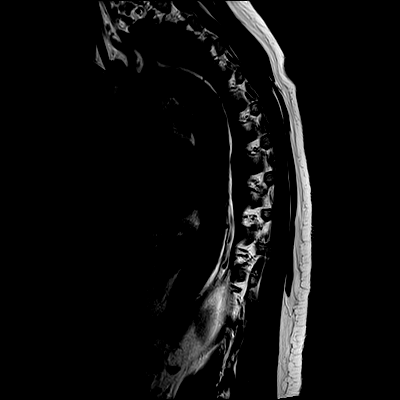
[im 8/15]
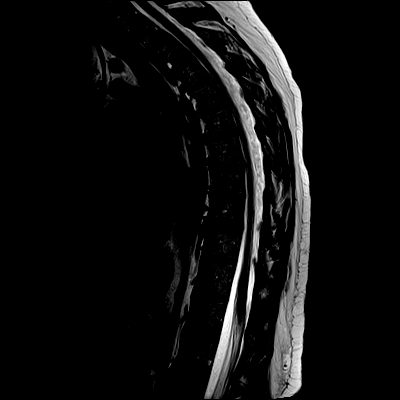
[im 15/15]
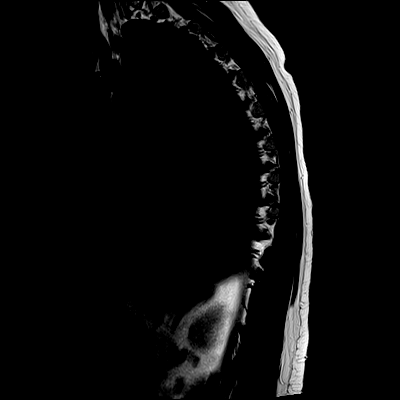

[Series 12: T1 · sagittal · 3.0mm · 0.75mm/px · 3 of 15 slices shown (3 of 4)]
[im 1/15]
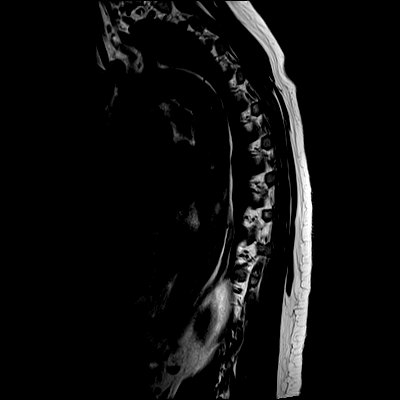
[im 8/15]
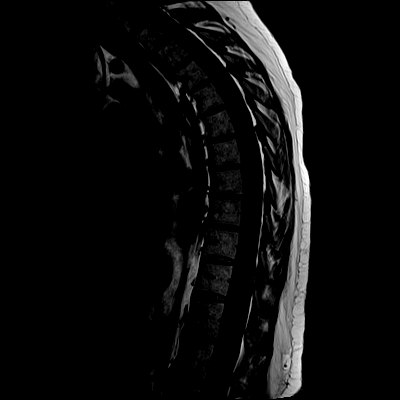
[im 15/15]
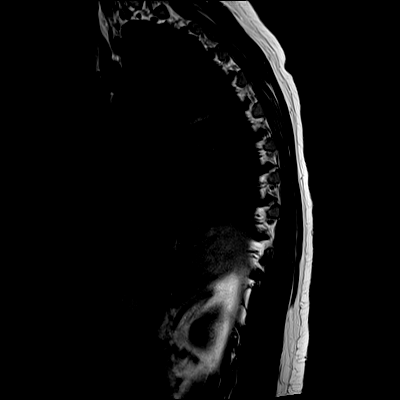

[Series 14: T2 · axial · 5.0mm · 0.59mm/px · z∈[-359,-192]mm · 7 of 36 slices shown (2 of 2)]
[im 1/36]
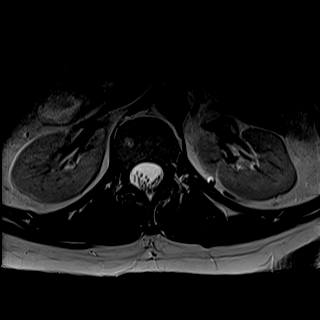
[im 6/36]
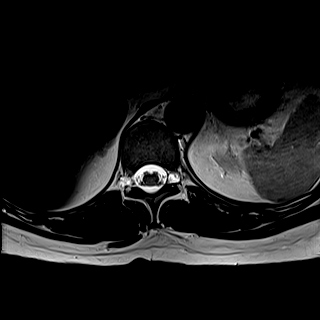
[im 12/36]
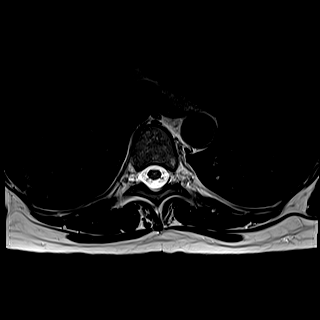
[im 18/36]
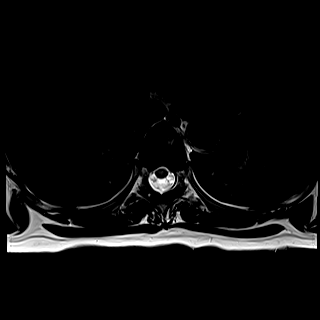
[im 24/36]
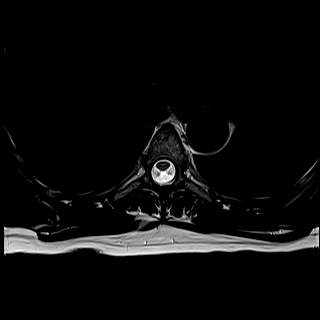
[im 30/36]
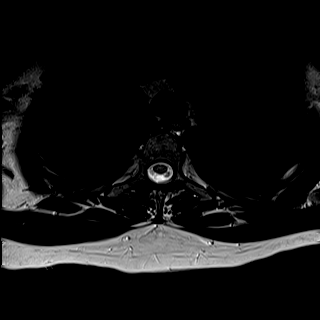
[im 36/36]
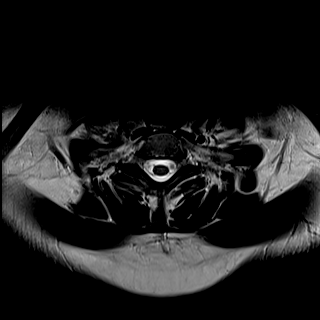

[Series 16: T1 · axial · non-contrast · 5.0mm · 0.31mm/px · z∈[-359,-230]mm · 5 of 36 slices shown (4 of 4)]
[im 1/36]
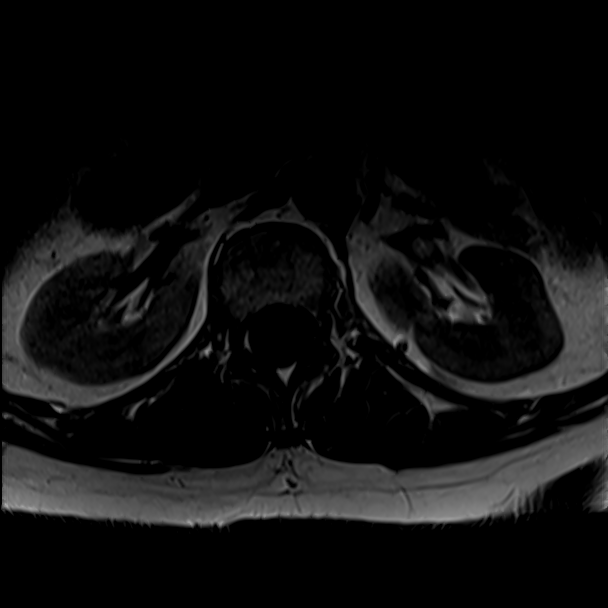
[im 6/36]
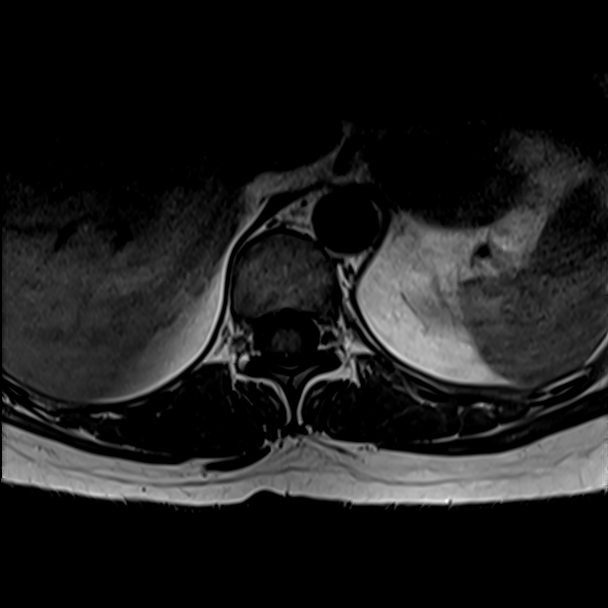
[im 12/36]
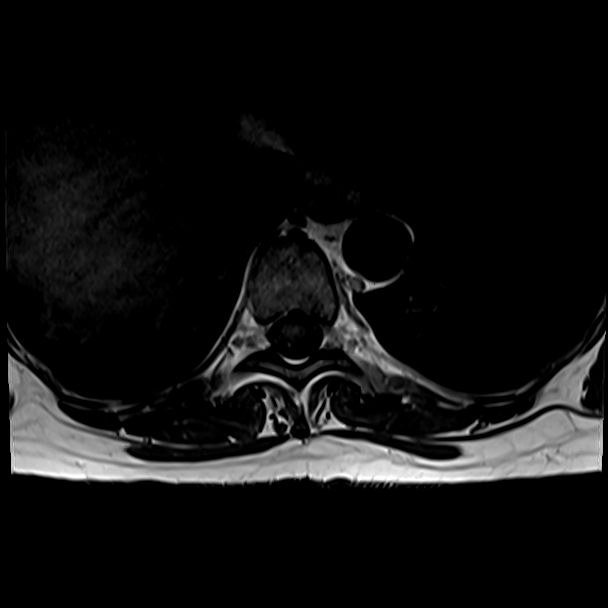
[im 18/36]
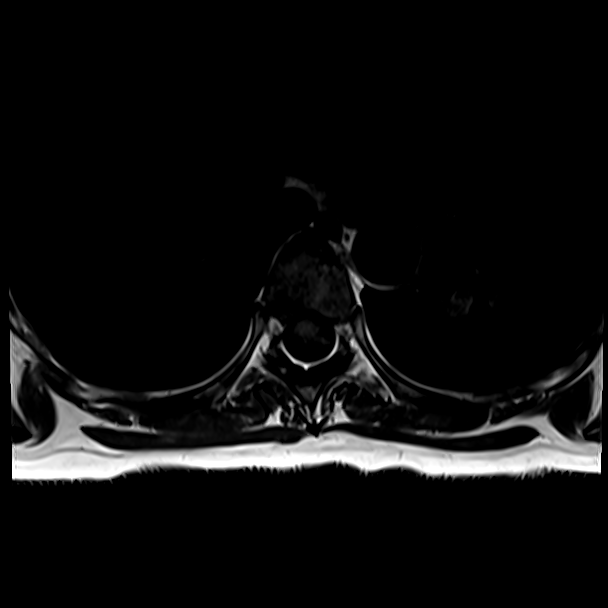
[im 24/36]
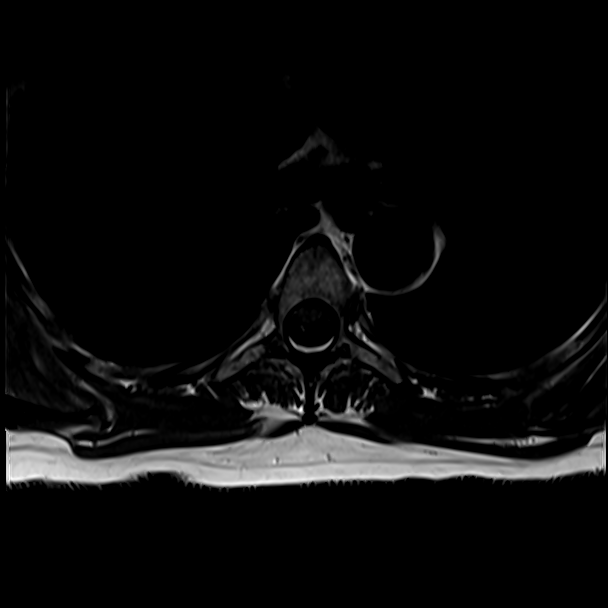

[26 of 48 positions shown; findings below may reference images not displayed]

FINDINGS: MRI CERVICAL SPINE FINDINGS

Alignment: Physiologic.

Vertebrae: No fracture, evidence of discitis, or bone lesion.

Cord: Normal signal and morphology. No abnormal contrast
enhancement.

Posterior Fossa, vertebral arteries, paraspinal tissues: Negative.

Disc levels:

C1-2: Unremarkable.

C2-3: Normal disc space and facet joints. There is no spinal canal
stenosis. No neural foraminal stenosis.

C3-4: Normal disc space and facet joints. There is no spinal canal
stenosis. No neural foraminal stenosis.

C4-5: Small disc bulge. There is no spinal canal stenosis. No neural
foraminal stenosis.

C5-6: Right asymmetric small disc bulge with right facet
hypertrophy. There is no spinal canal stenosis. Moderate right
neural foraminal stenosis.

C6-7: Small central disc protrusion. There is no spinal canal
stenosis. No neural foraminal stenosis.

C7-T1: Normal disc space and facet joints. There is no spinal canal
stenosis. No neural foraminal stenosis.

MRI THORACIC SPINE FINDINGS

Alignment:  Physiologic.

Vertebrae: No fracture, evidence of discitis, or bone lesion.

Cord: Normal signal and morphology. No abnormal contrast
enhancement.

Paraspinal and other soft tissues: Negative.

Disc levels:

No spinal canal stenosis.
IMPRESSION: 1. No evidence of demyelinating disease of the cervical or thoracic
spine.
2. Cervical degenerative disc disease without spinal canal stenosis.
3. Moderate right C5-6 neural foraminal stenosis.

## 2020-04-24 IMAGING — MR MR CERVICAL SPINE WO/W CM
6 of 9 series · 25 of 48 positions shown · IV contrast (gadavist)
Comparison: None.

CLINICAL DATA: Right eye pain.  Concern for demyelinating disease.

EXAM:
MRI CERVICAL AND THORACIC SPINE WITHOUT AND WITH CONTRAST
TECHNIQUE: Multiplanar and multiecho pulse sequences of the cervical spine, to
include the craniocervical junction and cervicothoracic junction,
and the thoracic spine, were obtained without and with intravenous
contrast.
CONTRAST:  7.5mL GADAVIST GADOBUTROL 1 MMOL/ML IV SOLN

[Series 27: T1 · sagittal · 3.3mm · 0.56mm/px · 2 of 11 slices shown (1 of 3)]
[im 1/11]
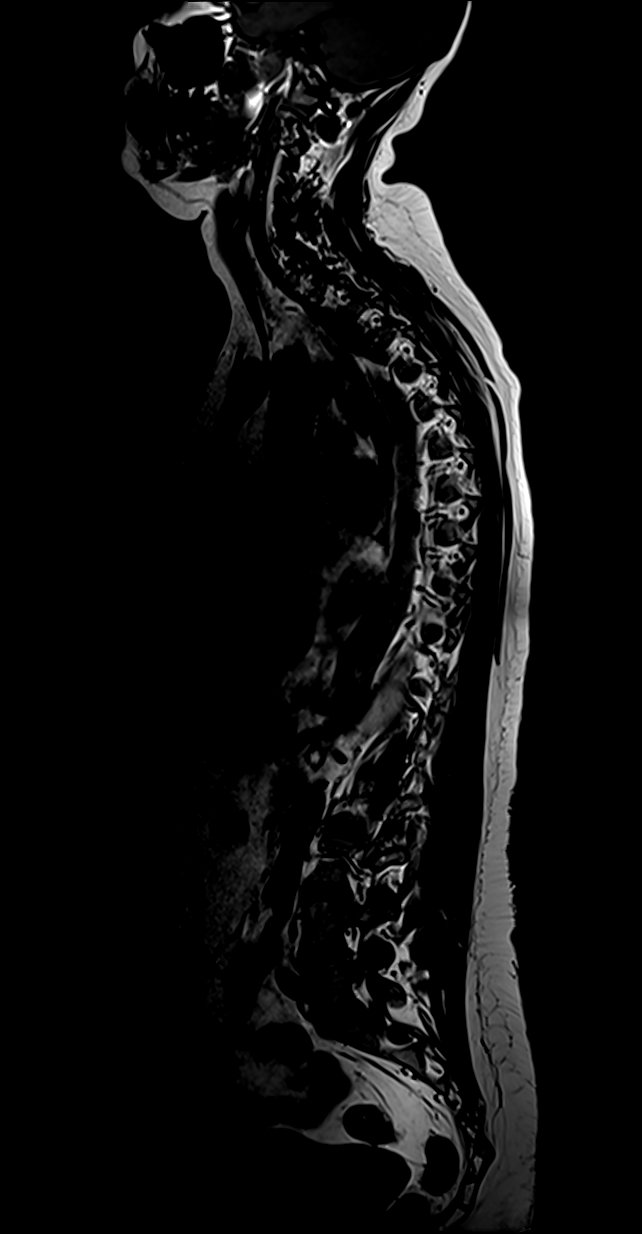
[im 11/11]
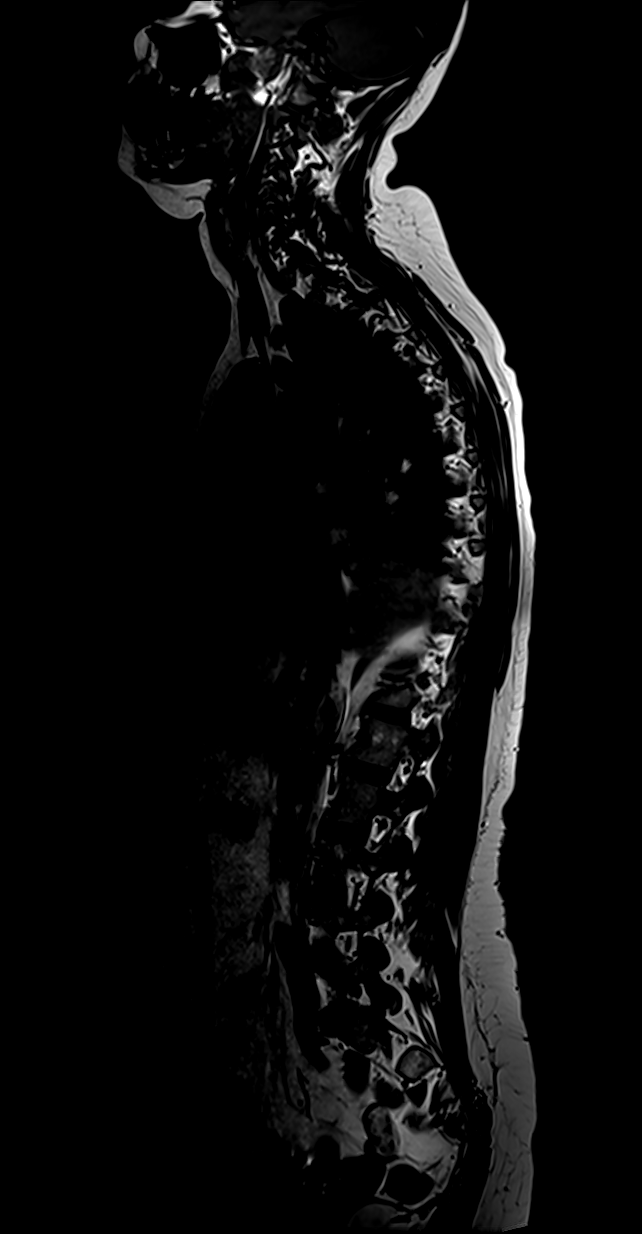

[Series 28: T2 · sagittal · 3.0mm · 0.62mm/px · 3 of 15 slices shown (1 of 2)]
[im 1/15]
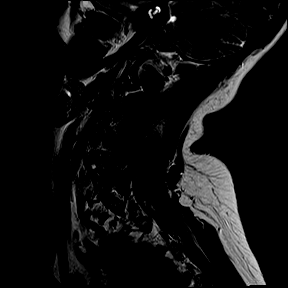
[im 8/15]
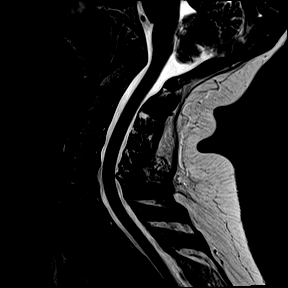
[im 15/15]
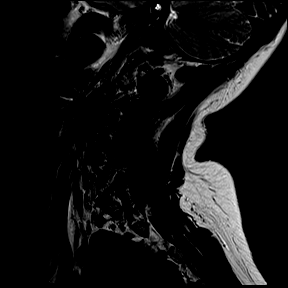

[Series 29: T1 · sagittal · 3.0mm · 0.62mm/px · 3 of 15 slices shown (2 of 3)]
[im 1/15]
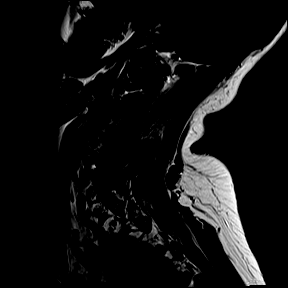
[im 8/15]
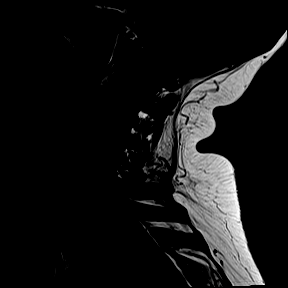
[im 15/15]
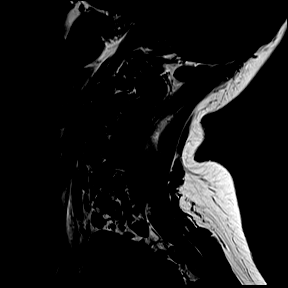

[Series 30: STIR · sagittal · 3.0mm · 0.70mm/px · 1 of 15 slices shown]
[im 1/15]
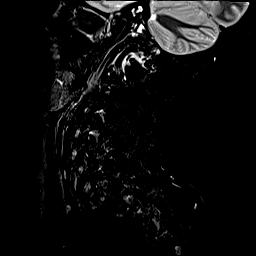

[Series 31: T2 · axial · 3.0mm · 0.66mm/px · z∈[-144,-49]mm · 8 of 30 slices shown (2 of 2)]
[im 1/30]
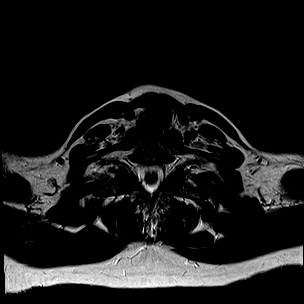
[im 5/30]
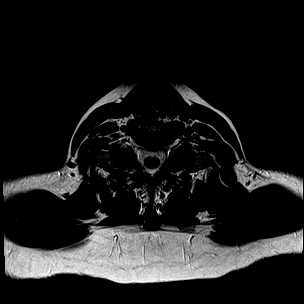
[im 9/30]
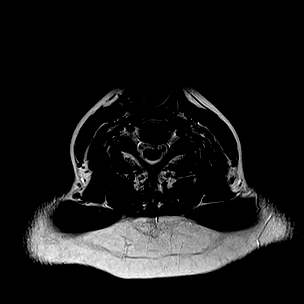
[im 13/30]
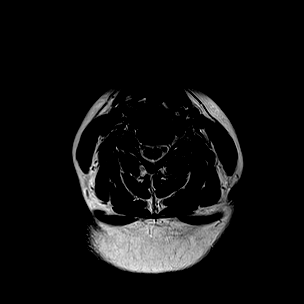
[im 17/30]
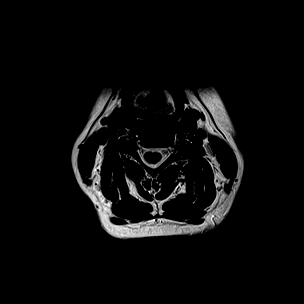
[im 21/30]
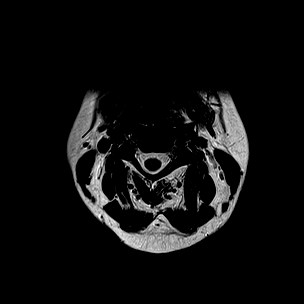
[im 25/30]
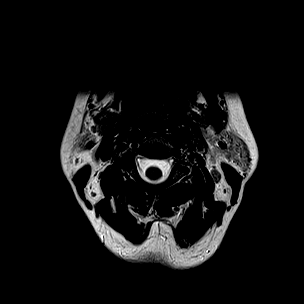
[im 30/30]
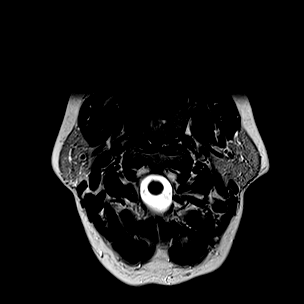

[Series 33: T1 · axial · 3.0mm · 0.35mm/px · z∈[-143,-48]mm · 8 of 30 slices shown (3 of 3)]
[im 1/30]
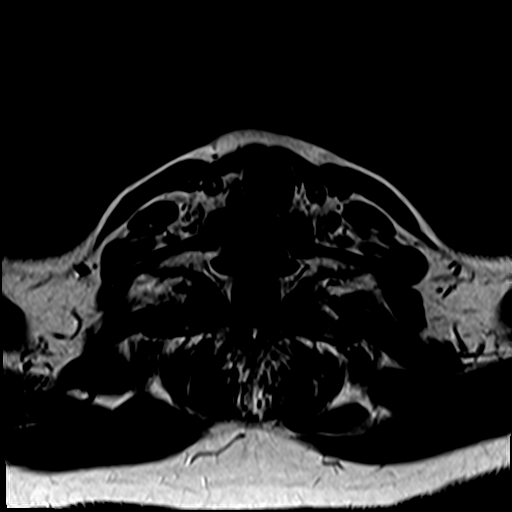
[im 5/30]
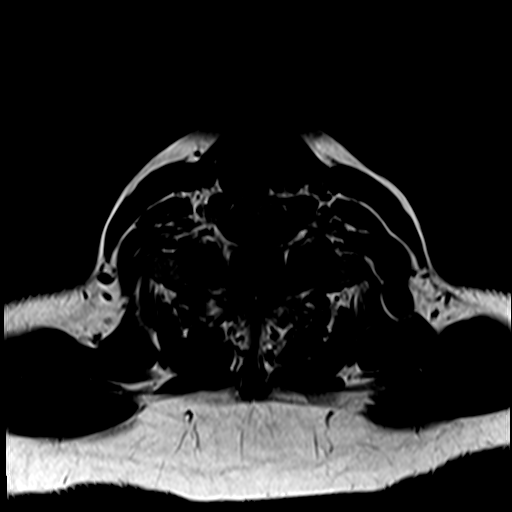
[im 9/30]
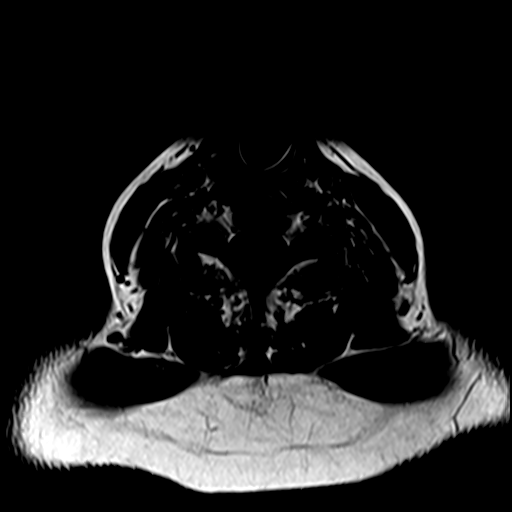
[im 13/30]
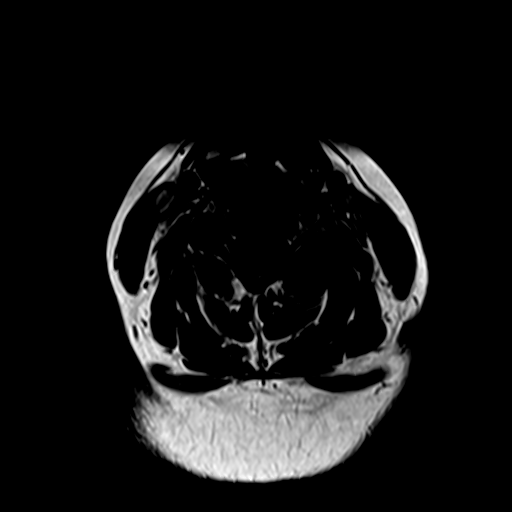
[im 17/30]
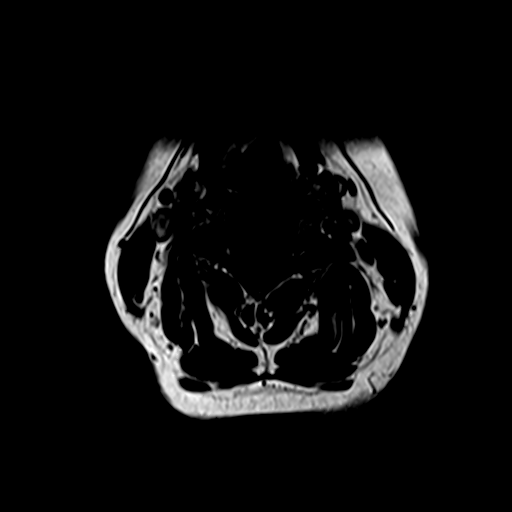
[im 21/30]
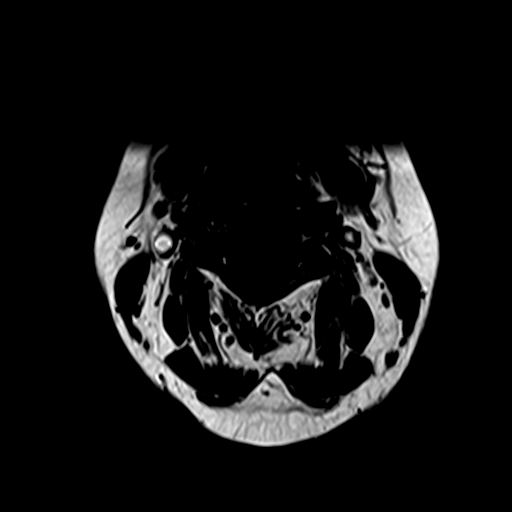
[im 25/30]
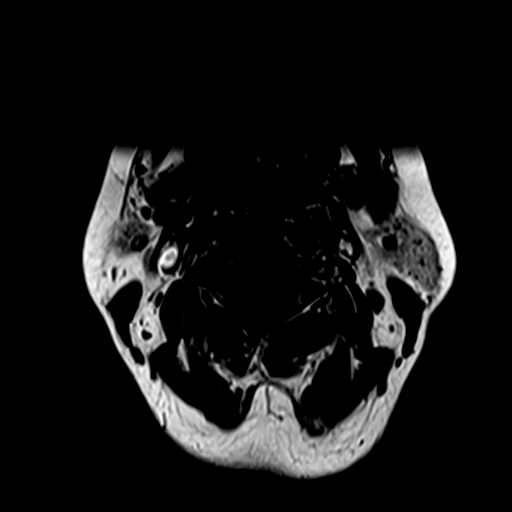
[im 30/30]
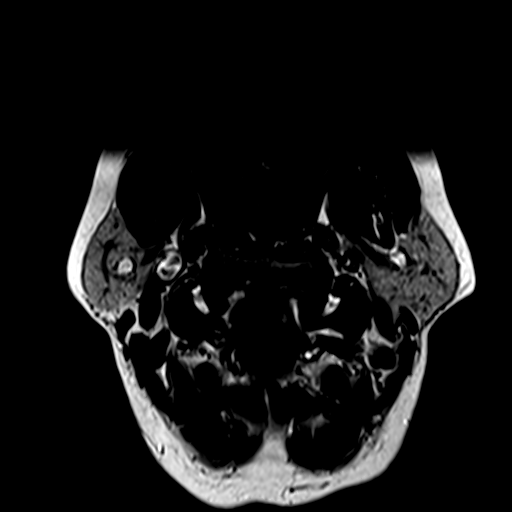

[25 of 48 positions shown; findings below may reference images not displayed]

FINDINGS: MRI CERVICAL SPINE FINDINGS

Alignment: Physiologic.

Vertebrae: No fracture, evidence of discitis, or bone lesion.

Cord: Normal signal and morphology. No abnormal contrast
enhancement.

Posterior Fossa, vertebral arteries, paraspinal tissues: Negative.

Disc levels:

C1-2: Unremarkable.

C2-3: Normal disc space and facet joints. There is no spinal canal
stenosis. No neural foraminal stenosis.

C3-4: Normal disc space and facet joints. There is no spinal canal
stenosis. No neural foraminal stenosis.

C4-5: Small disc bulge. There is no spinal canal stenosis. No neural
foraminal stenosis.

C5-6: Right asymmetric small disc bulge with right facet
hypertrophy. There is no spinal canal stenosis. Moderate right
neural foraminal stenosis.

C6-7: Small central disc protrusion. There is no spinal canal
stenosis. No neural foraminal stenosis.

C7-T1: Normal disc space and facet joints. There is no spinal canal
stenosis. No neural foraminal stenosis.

MRI THORACIC SPINE FINDINGS

Alignment:  Physiologic.

Vertebrae: No fracture, evidence of discitis, or bone lesion.

Cord: Normal signal and morphology. No abnormal contrast
enhancement.

Paraspinal and other soft tissues: Negative.

Disc levels:

No spinal canal stenosis.
IMPRESSION: 1. No evidence of demyelinating disease of the cervical or thoracic
spine.
2. Cervical degenerative disc disease without spinal canal stenosis.
3. Moderate right C5-6 neural foraminal stenosis.

## 2020-04-24 IMAGING — MR MR ORBITS WO/W CM
18 of 22 series · 36 of 48 positions shown · IV contrast (gadavist)
Comparison: None.

CLINICAL DATA: Right eye pain and vision loss. Family history of
multiple sclerosis.

EXAM:
MRI HEAD AND ORBITS WITHOUT AND WITH CONTRAST
TECHNIQUE: Multiplanar, multiecho pulse sequences of the brain and surrounding
structures were obtained without and with intravenous contrast.
Multiplanar, multiecho pulse sequences of the orbits and surrounding
structures were obtained including fat saturation techniques, before
and after intravenous contrast administration.
CONTRAST:  7.5mL GADAVIST GADOBUTROL 1 MMOL/ML IV SOLN

[Series 5: DWI · axial · 3.0mm · 0.88mm/px · z∈[-23,+130]mm · 7 of 104 slices shown (1 of 4)]
[im 1/104]
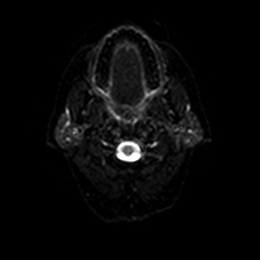
[im 18/104]
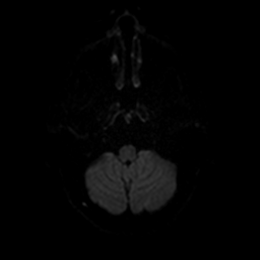
[im 35/104]
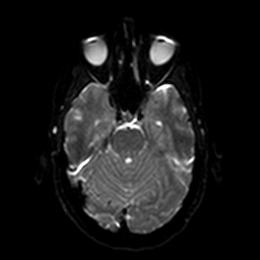
[im 52/104]
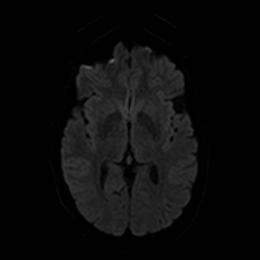
[im 69/104]
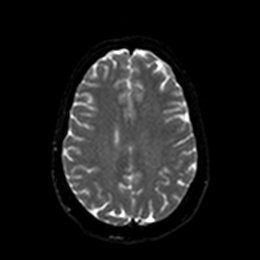
[im 86/104]
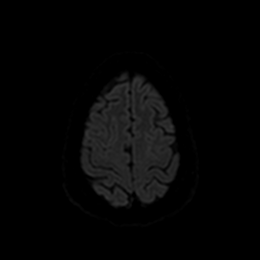
[im 104/104]
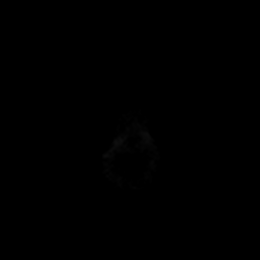

[Series 6: DWI · axial · 3.0mm · 0.88mm/px · z∈[-23,+130]mm · 3 of 51 slices shown (2 of 4)]
[im 1/51]
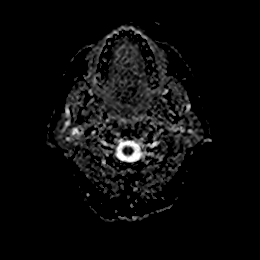
[im 26/51]
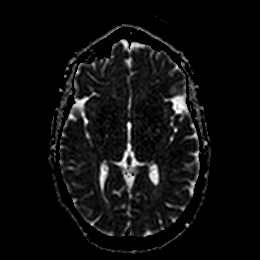
[im 51/51]
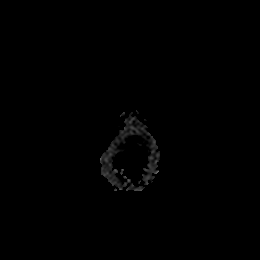

[Series 7: DWI · coronal · 4.0mm · 0.88mm/px · 4 of 72 slices shown (3 of 4)]
[im 1/72]
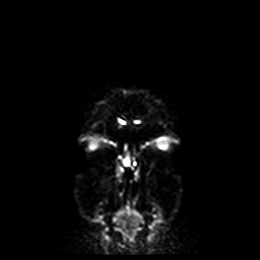
[im 24/72]
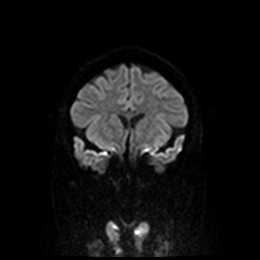
[im 48/72]
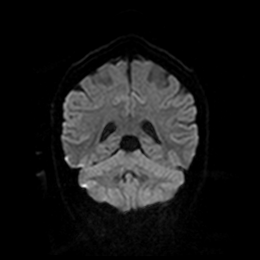
[im 72/72]
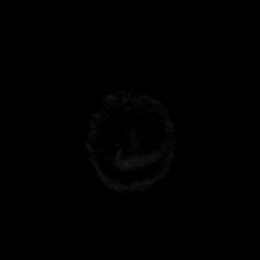

[Series 8: DWI · coronal · 4.0mm · 0.88mm/px · 2 of 36 slices shown (4 of 4)]
[im 1/36]
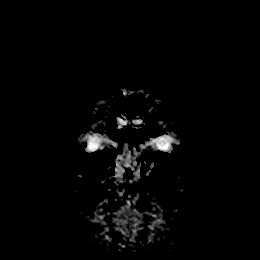
[im 36/36]
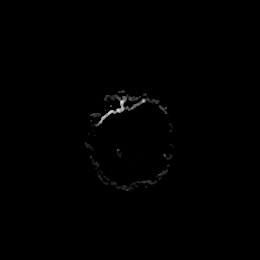

[Series 9: T1 · sagittal · 5.0mm · 0.75mm/px · 1 of 25 slices shown (1 of 3)]
[im 1/25]
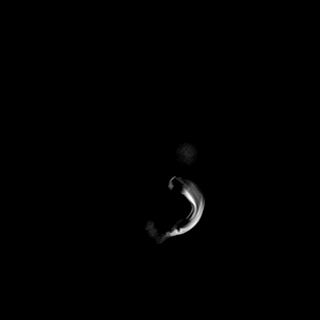

[Series 10: T2 · axial · 5.0mm · 0.72mm/px · 1 of 25 slices shown]
[im 1/25]
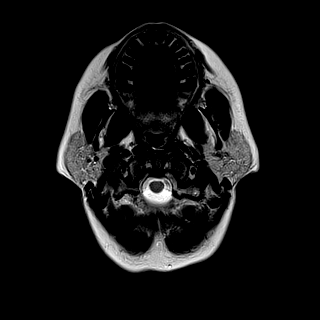

[Series 11: FLAIR · axial · 5.0mm · 0.45mm/px · 1 of 25 slices shown]
[im 1/25]
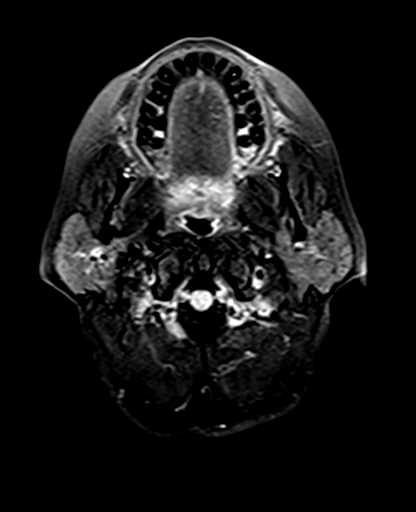

[Series 18: T1 · axial · non-contrast · 3.0mm · 0.37mm/px · 1 of 25 slices shown (2 of 3)]
[im 1/25]
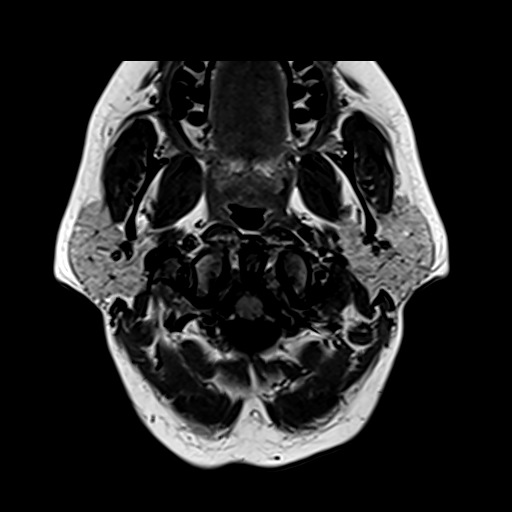

[Series 19: T2 fat-sat · axial · 3.0mm · 0.54mm/px · 1 of 25 slices shown (1 of 4)]
[im 1/25]
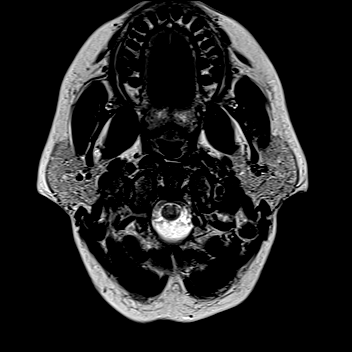

[Series 21: T2 fat-sat · axial · 3.0mm · 0.54mm/px · 1 of 25 slices shown (2 of 4)]
[im 1/25]
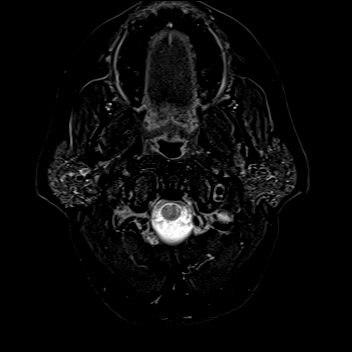

[Series 22: T2 fat-sat · coronal · 3.0mm · 0.54mm/px · 2 of 29 slices shown (3 of 4)]
[im 1/29]
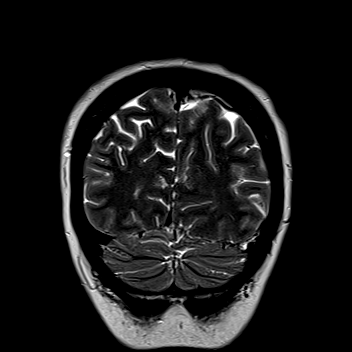
[im 29/29]
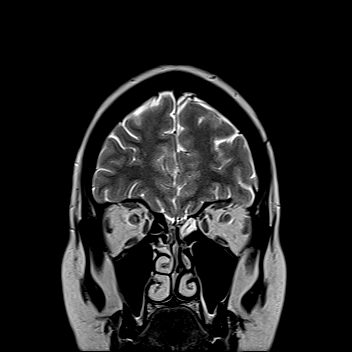

[Series 24: T2 fat-sat · coronal · 3.0mm · 0.54mm/px · 2 of 29 slices shown (4 of 4)]
[im 1/29]
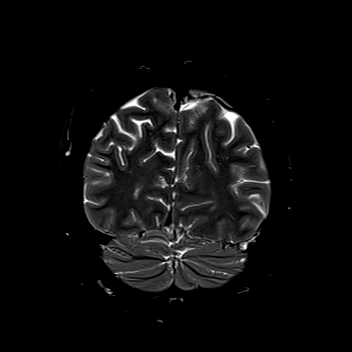
[im 29/29]
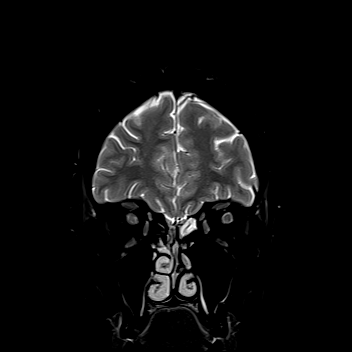

[Series 25: T1 · coronal · 3.0mm · 0.37mm/px · 2 of 29 slices shown (3 of 3)]
[im 1/29]
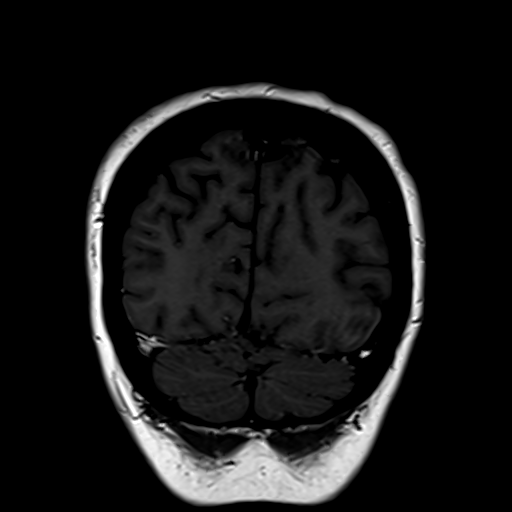
[im 29/29]
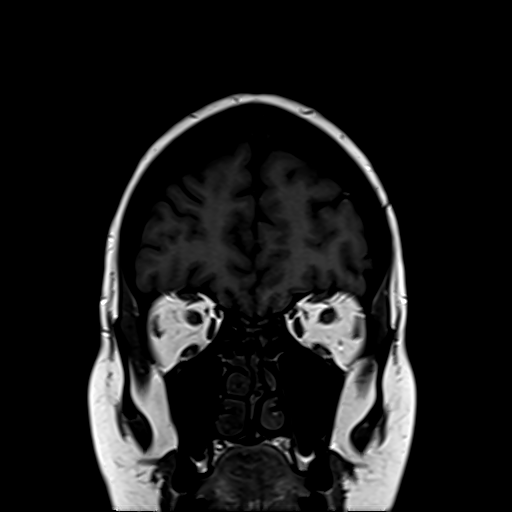

[Series 26: T1 fat-sat post-contrast · axial · 3.0mm · 0.37mm/px · 1 of 25 slices shown (1 of 2)]
[im 1/25]
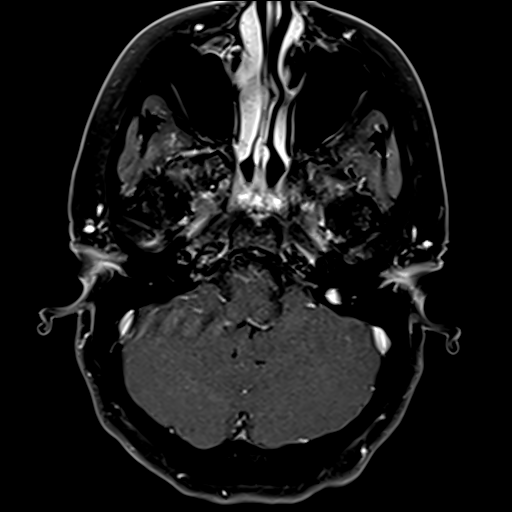

[Series 27: T1 fat-sat post-contrast · coronal · 3.0mm · 0.37mm/px · 2 of 29 slices shown (2 of 2)]
[im 1/29]
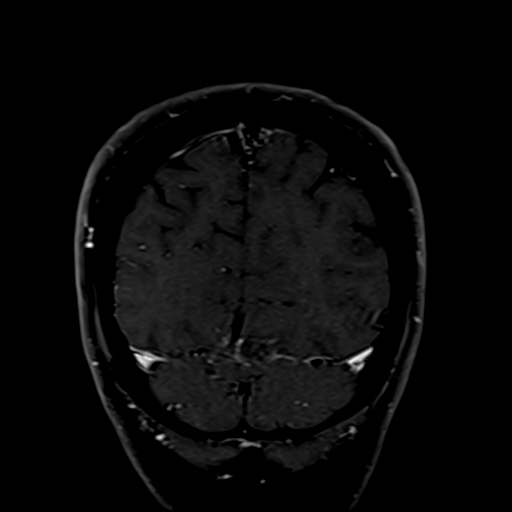
[im 29/29]
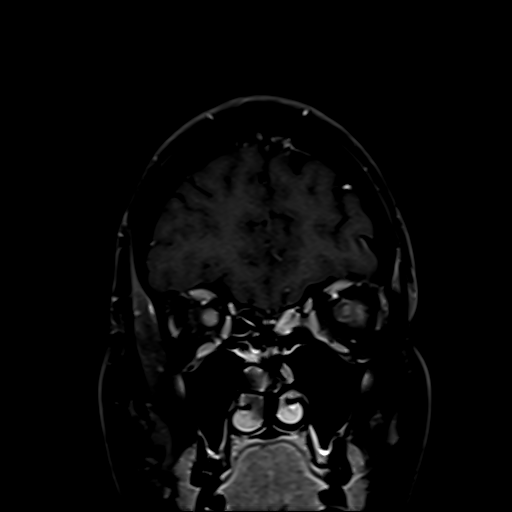

[Series 28: T2 post-contrast · coronal · 5.0mm · 0.72mm/px · 2 of 30 slices shown]
[im 1/30]
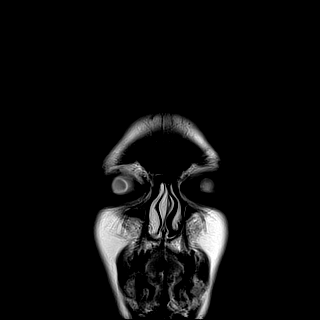
[im 30/30]
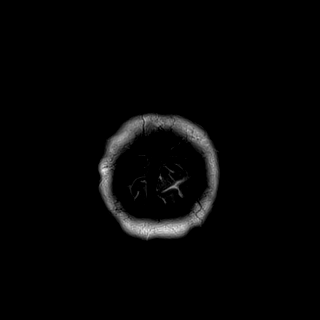

[Series 30: T1 post-contrast · coronal · 5.0mm · 0.34mm/px · 2 of 30 slices shown (1 of 2)]
[im 1/30]
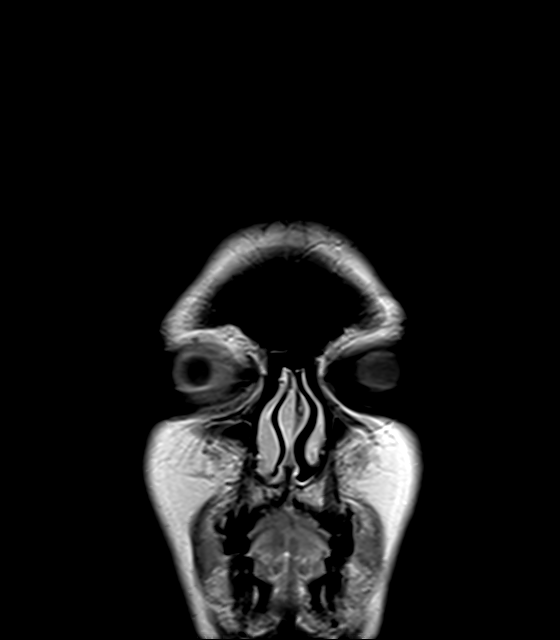
[im 30/30]
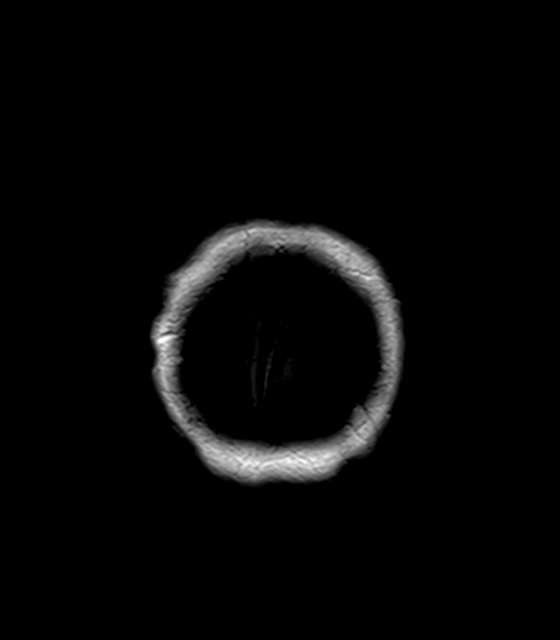

[Series 31: T1 post-contrast · sagittal · 5.0mm · 0.75mm/px · 1 of 25 slices shown (2 of 2)]
[im 1/25]
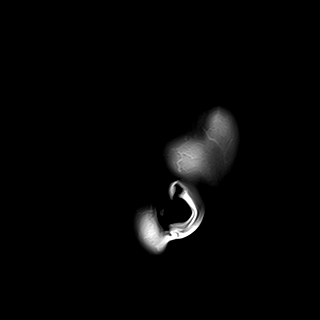

[36 of 48 positions shown; findings below may reference images not displayed]

FINDINGS: MRI HEAD FINDINGS

Brain: No acute infarct, mass effect or extra-axial collection. No
acute or chronic hemorrhage. Normal white matter signal, parenchymal
volume and CSF spaces. The midline structures are normal. Prominent
perivascular space of the left lentiform nucleus.

Vascular: Major flow voids are preserved.

Skull and upper cervical spine: Normal calvarium and skull base.
Visualized upper cervical spine and soft tissues are normal.

MRI ORBITS FINDINGS

Orbits: There is abnormal contrast enhancement of the right optic
nerve. No other orbital abnormality. The abnormal enhancement
involves only the intraorbital segment of the nerve.

Visualized sinuses: Minimal left ethmoid mucosal thickening.

Soft tissues: Negative
IMPRESSION: 1. Abnormal contrast enhancement of the intraorbital right optic
nerve, consistent with optic neuritis.
2. Normal MRI of the brain.  No evidence of demyelinating disease.

## 2020-04-24 IMAGING — CR DG CHEST 2V
2 series · 2 of 2 positions shown · non-contrast
Comparison: None.

CLINICAL DATA: Chest pain multiple sclerosis

EXAM:
CHEST - 2 VIEW

[chest pa]
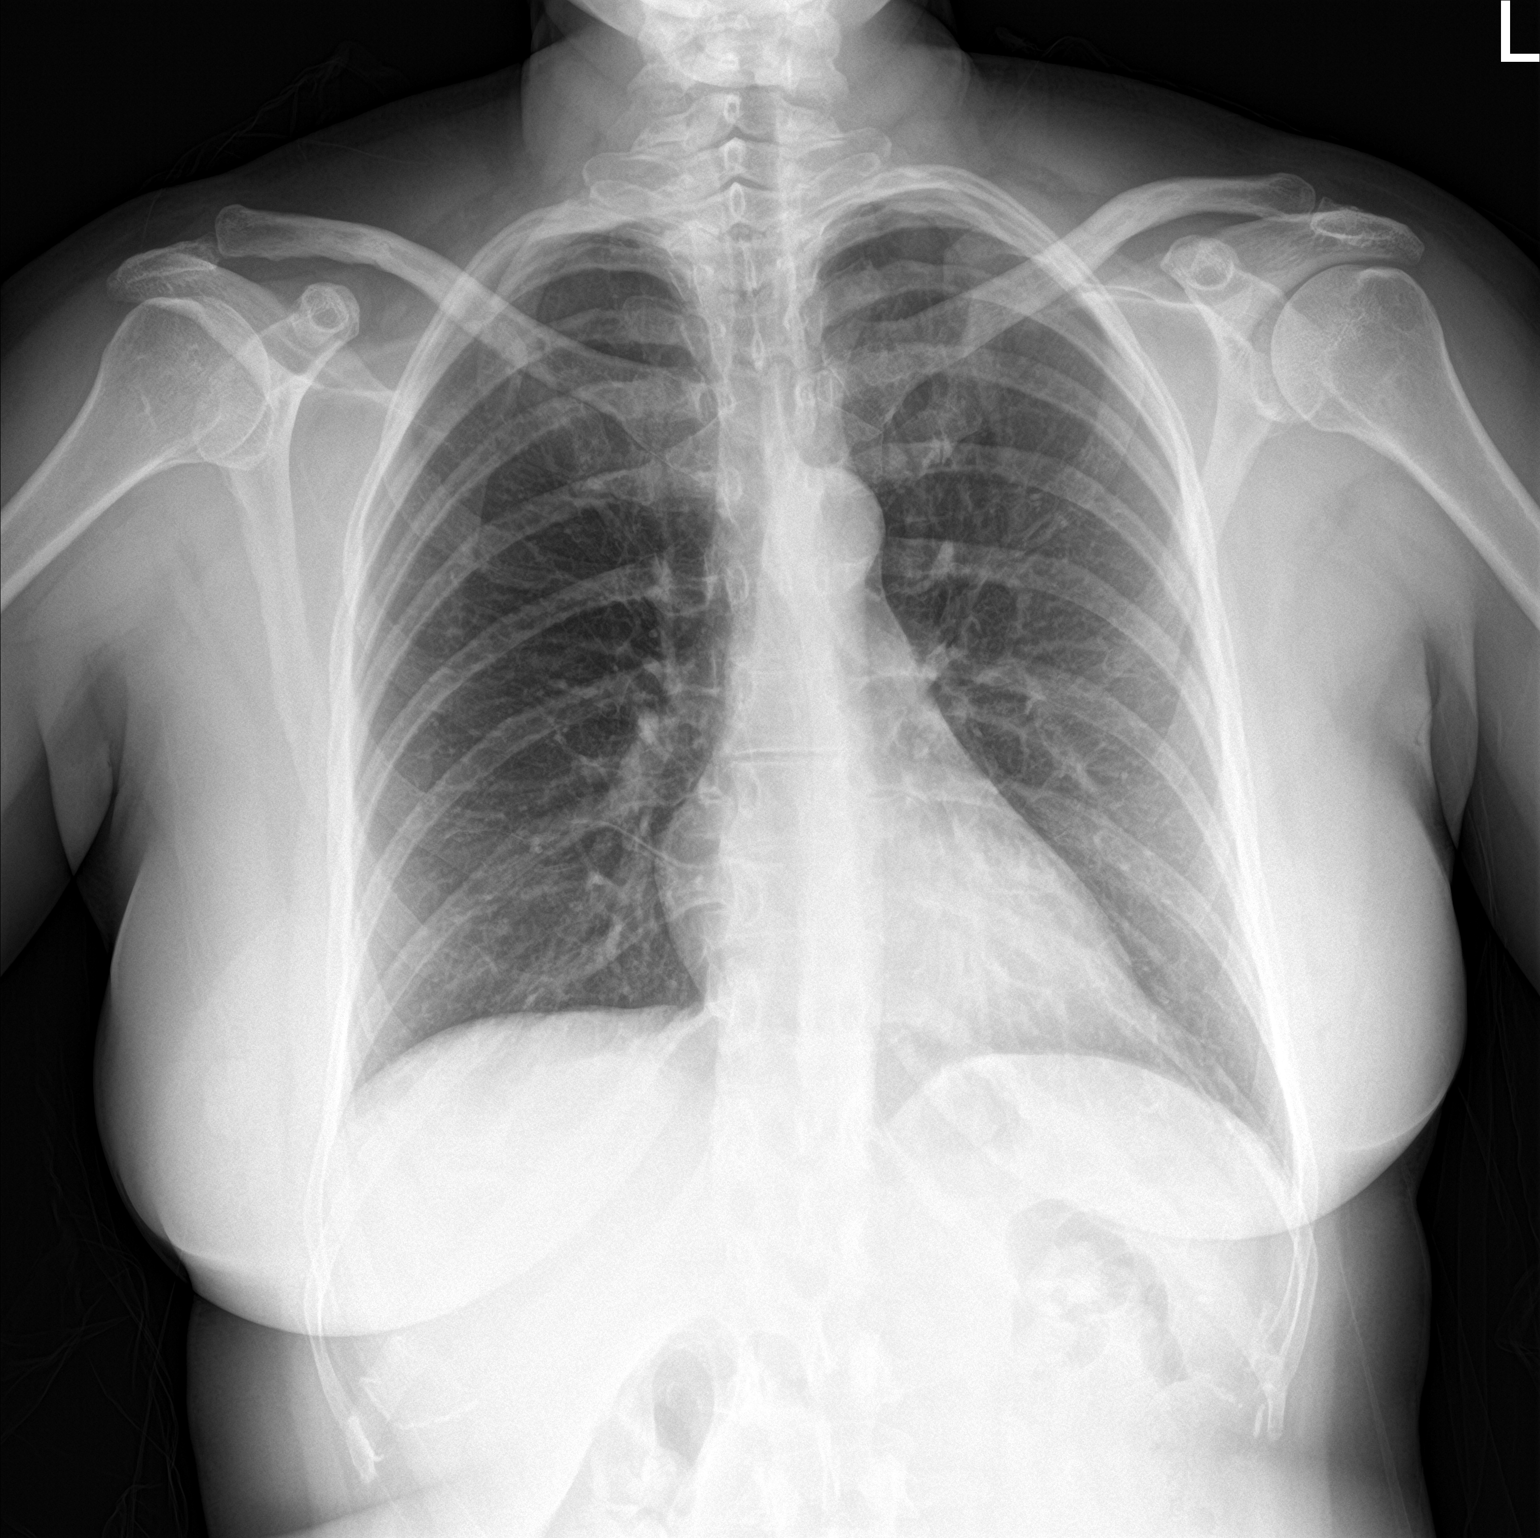

[chest lat]
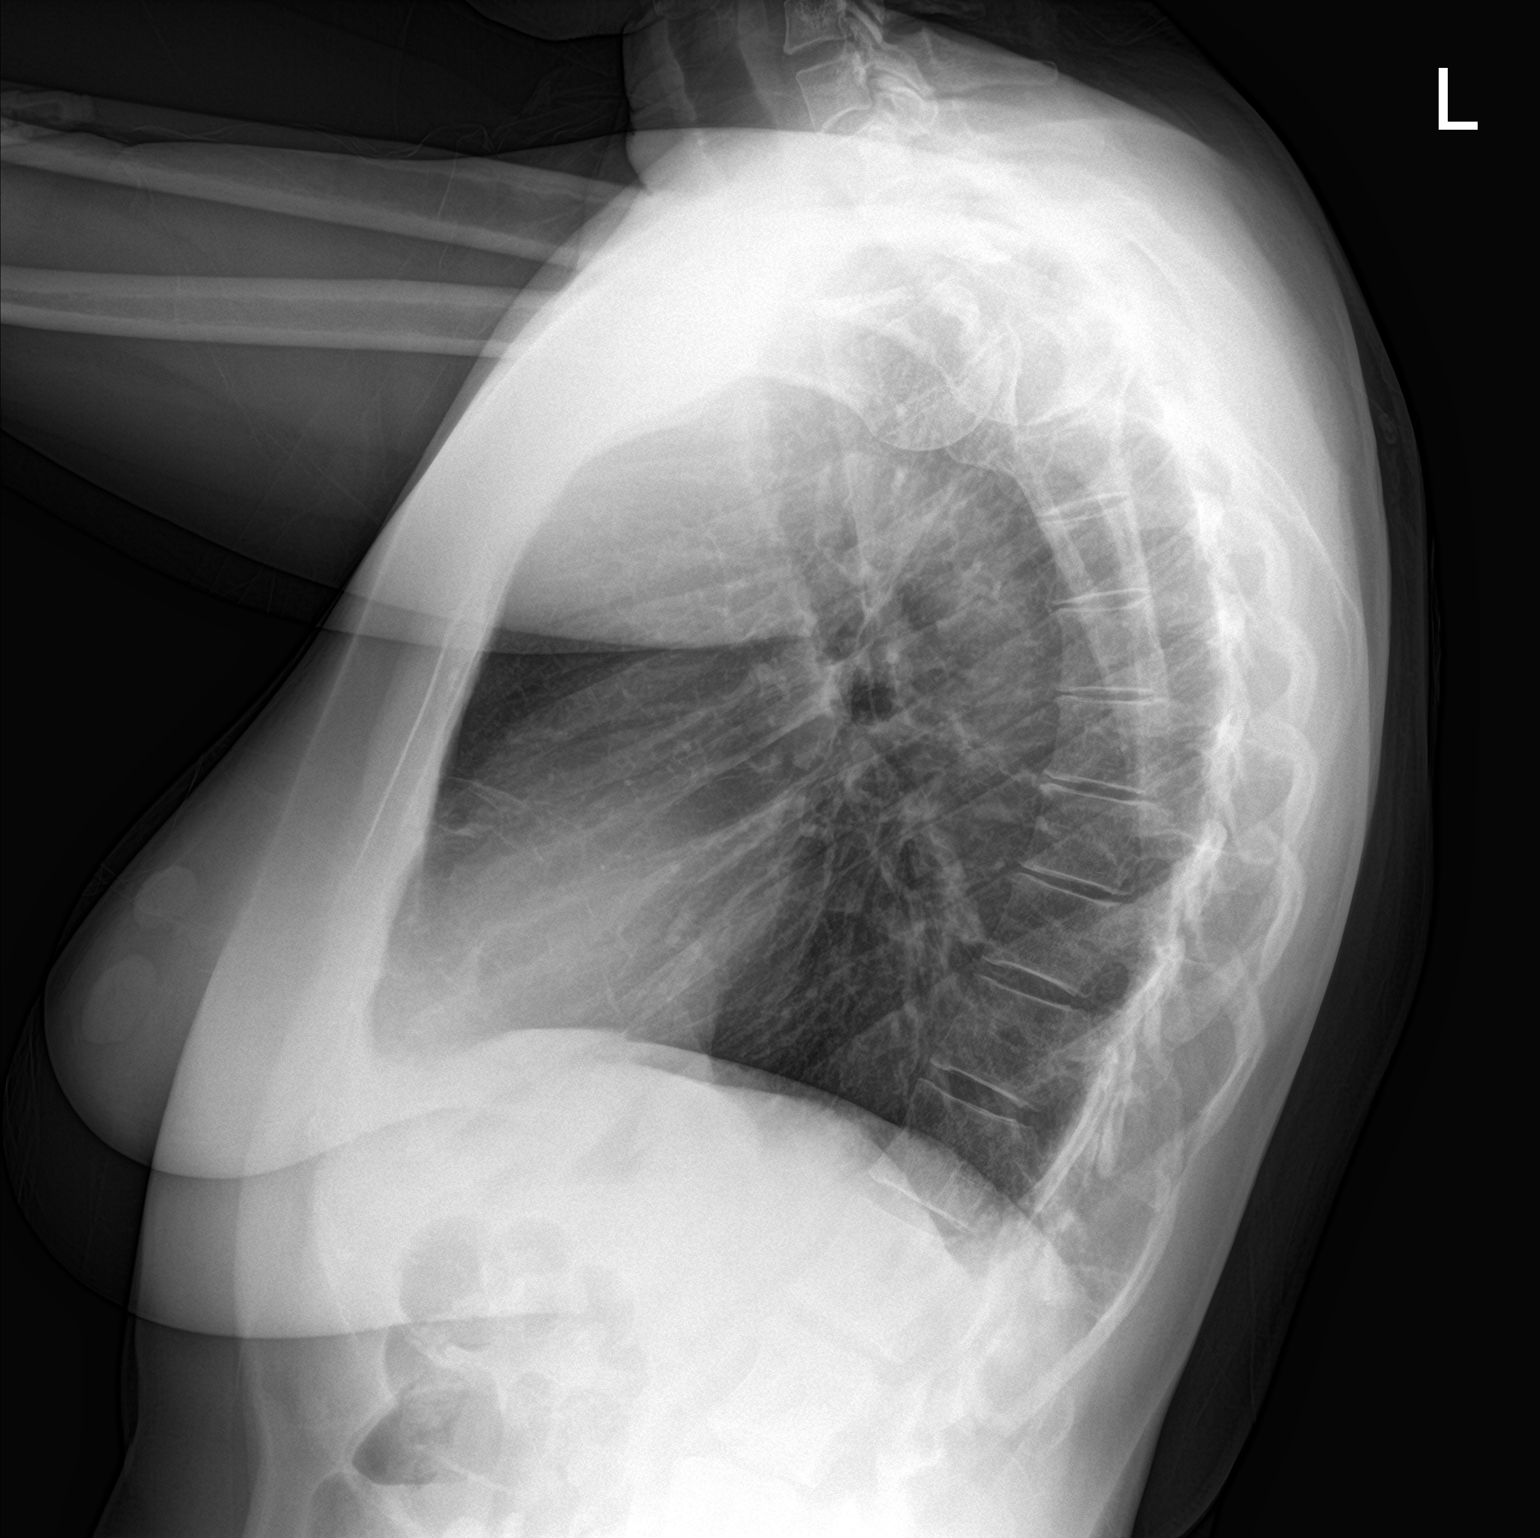

[2 of 2 positions shown; findings below may reference images not displayed]

FINDINGS: The heart size and mediastinal contours are within normal limits.
Both lungs are clear. The visualized skeletal structures are
unremarkable.
IMPRESSION: No active cardiopulmonary disease.

## 2020-04-24 MED ORDER — ASCORBIC ACID 500 MG PO TABS
250.0000 mg | ORAL_TABLET | Freq: Every day | ORAL | Status: DC
  Administered 2020-04-25 – 2020-04-29 (×5): 250 mg via ORAL
  Filled 2020-04-24 (×5): qty 1

## 2020-04-24 MED ORDER — VITAMIN D 25 MCG (1000 UNIT) PO TABS
1000.0000 [IU] | ORAL_TABLET | Freq: Every day | ORAL | Status: DC
  Administered 2020-04-25 – 2020-04-29 (×5): 1000 [IU] via ORAL
  Filled 2020-04-24 (×5): qty 1

## 2020-04-24 MED ORDER — INSULIN ASPART 100 UNIT/ML ~~LOC~~ SOLN
0.0000 [IU] | Freq: Three times a day (TID) | SUBCUTANEOUS | Status: DC
  Administered 2020-04-25 – 2020-04-26 (×2): 3 [IU] via SUBCUTANEOUS
  Administered 2020-04-26 (×2): 2 [IU] via SUBCUTANEOUS
  Administered 2020-04-27: 3 [IU] via SUBCUTANEOUS
  Administered 2020-04-27: 2 [IU] via SUBCUTANEOUS

## 2020-04-24 MED ORDER — GADOBUTROL 1 MMOL/ML IV SOLN
7.5000 mL | Freq: Once | INTRAVENOUS | Status: AC | PRN
  Administered 2020-04-24: 7.5 mL via INTRAVENOUS

## 2020-04-24 MED ORDER — ACETAMINOPHEN 650 MG RE SUPP: 650.0000 mg | Freq: Four times a day (QID) | RECTAL | Status: DC | PRN

## 2020-04-24 MED ORDER — LEVOTHYROXINE SODIUM 75 MCG PO TABS
75.0000 ug | ORAL_TABLET | Freq: Every day | ORAL | Status: DC
  Administered 2020-04-26 – 2020-04-29 (×4): 75 ug via ORAL
  Filled 2020-04-24 (×5): qty 1

## 2020-04-24 MED ORDER — TURMERIC 500 MG PO CAPS: ORAL_CAPSULE | Freq: Every day | ORAL | Status: DC

## 2020-04-24 MED ORDER — SODIUM CHLORIDE 0.9 % IV SOLN
1000.0000 mg | Freq: Every day | INTRAVENOUS | Status: AC
  Administered 2020-04-25 – 2020-04-29 (×5): 1000 mg via INTRAVENOUS
  Filled 2020-04-24 (×5): qty 8

## 2020-04-24 MED ORDER — ZINC 40 MG PO TABS: ORAL_TABLET | Freq: Every day | ORAL | Status: DC

## 2020-04-24 MED ORDER — CULTRELLE KIDS IMMUNE DEFENSE PO CHEW: CHEWABLE_TABLET | Freq: Every day | ORAL | Status: DC

## 2020-04-24 MED ORDER — SACCHAROMYCES BOULARDII 250 MG PO CAPS
250.0000 mg | ORAL_CAPSULE | Freq: Two times a day (BID) | ORAL | Status: DC
  Administered 2020-04-25 – 2020-04-29 (×9): 250 mg via ORAL
  Filled 2020-04-24 (×9): qty 1

## 2020-04-24 MED ORDER — PANTOPRAZOLE SODIUM 40 MG PO TBEC
40.0000 mg | DELAYED_RELEASE_TABLET | Freq: Two times a day (BID) | ORAL | Status: AC
  Administered 2020-04-24 – 2020-04-29 (×10): 40 mg via ORAL
  Filled 2020-04-24 (×10): qty 1

## 2020-04-24 MED ORDER — ENOXAPARIN SODIUM 40 MG/0.4ML ~~LOC~~ SOLN
40.0000 mg | SUBCUTANEOUS | Status: DC
  Filled 2020-04-24 (×2): qty 0.4

## 2020-04-24 MED ORDER — ACETAMINOPHEN 325 MG PO TABS: 650.0000 mg | ORAL_TABLET | Freq: Four times a day (QID) | ORAL | Status: DC | PRN

## 2020-04-24 MED ORDER — ZINC SULFATE 220 (50 ZN) MG PO CAPS
220.0000 mg | ORAL_CAPSULE | Freq: Every day | ORAL | Status: DC
  Administered 2020-04-25 – 2020-04-29 (×5): 220 mg via ORAL
  Filled 2020-04-24 (×5): qty 1

## 2020-04-24 NOTE — Consult Note (Signed)
Neurology Consultation Reason for Consult: c/f optic neuritis  Requesting Physician: Wynetta Fines  CC: Right eye painful vision loss   History is obtained from: Patient and chart review   HPI: Valerie [name] is a 50 y.o. female with a past medical history significant for Hashimoto's thyroiditis presenting with right eye painful vision loss.  She reports that approximately December 18/19 she began to develop an upper respiratory infection with cough and stuffy nose that lasted for approximately 2 weeks.  As this resolved, just before [place] Year[place] she noted some pain in her right eye as well as blurry vision in that eye and some sensitivity to light.  She describes the vision as being poorly/incompletely erased.  Initially the pain was manageable with Motrin.  On initial evaluation she was told she just had some dry eye.  Due to the severity of her difficulty seeing she sought a second opinion and was told to present to the hospital for further evaluation for concern for optic neuritis by ophthalmology.  Her review of systems is notably negative including Uhthoff's or Lhermitte's phenomena, no prior episodes of significant numbness/weakness, speech or vision issues.  She additionally denies any signs or symptoms of infection on an exhaustive questioning, including any potential exposure to tuberculosis.  Regarding cancer screening, she reports she is due for her colonoscopy this year and prior mammograms have been negative to date although she has a routine mammogram due soon as well.  She had a COVID-19 infection in August 2021, is not vaccinated and is not interested in getting vaccinated  ROS: A 14 point ROS was performed and is negative except as noted in the HPI.   Past Medical History:  Diagnosis Date  . Hypothyroidism    Past Surgical History:  Procedure Laterality Date  . TUBAL LIGATION     Current Outpatient Medications  Medication Instructions  . cholecalciferol (VITAMIN D3) 1,000  Units, Oral, Daily  . levothyroxine (SYNTHROID) 75 mcg, Oral, Daily  . Multiple Vitamins-Minerals (ZINC PO) 1 tablet, Oral, Daily  . Probiotic Product (CULTRELLE KIDS IMMUNE DEFENSE PO) 1 tablet, Oral, Daily  . TURMERIC PO 1 tablet, Oral, Daily  . vitamin C (ASCORBIC ACID) 250 mg, Oral, Daily   Family History  Problem Relation Age of Onset  . Breast cancer Neg Hx    -Paternal grandmother had multiple sclerosis and presented with optic neuritis but was also having frequent falls at that time Maternal grandmother with glaucoma and macular degeneration Mother with glaucoma, optical migraines Aunt with retinal detachment, type 2 diabetes, obesity Father with colon adenoma  Social History:  reports that she has never smoked. She has never used smokeless tobacco. She reports that she does not drink alcohol and does not use drugs.   Exam: Current vital signs: BP 118/82 (BP Location: Left Arm)   Pulse 93   Temp 98.3 F (36.8 C) (Oral)   Resp 17   Ht 5\' 6"  (1.676 m)   Wt 76.2 kg   SpO2 95%   BMI 27.12 kg/m  Vital signs in last 24 hours: Temp:  [98.3 F (36.8 C)] 98.3 F (36.8 C) (01/11 1744) Pulse Rate:  [93] 93 (01/11 1744) Resp:  [17] 17 (01/11 1744) BP: (118)/(82) 118/82 (01/11 1744) SpO2:  [95 %] 95 % (01/11 1744) Weight:  [76.2 kg] 76.2 kg (01/11 1744)   Physical Exam  Constitutional: Appears well-developed and well-nourished.  Psych: Affect appropriate to situation Eyes: No scleral injection HENT: No OP obstrucion MSK: no joint deformities.  Cardiovascular: Normal rate and regular rhythm.  Respiratory: Effort normal, non-labored breathing GI: Soft.  No distension. There is no tenderness.  Skin: WDI  Neuro: Mental Status: Patient is awake, alert, oriented to person, place, month, year, and situation. Patient is able to give a clear and coherent history. No signs of aphasia or neglect, she struggled slightly with serial sevens but self-corrects Cranial  Nerves: II: She has significantly decreased visual acuity in the right eye.  She has moderate right eye afferent pupillary defect III,IV, VI: EOMI without ptosis or diploplia, though there is pain with right eye movement.  V: Facial sensation is symmetric to temperature VII: Facial movement is symmetric.  VIII: hearing is intact to voice X: Uvula elevates symmetrically XI: Shoulder shrug is symmetric. XII: tongue is midline without atrophy or fasciculations.  Motor: Tone is normal. Bulk is normal. 5/5 strength was present in all four extremities.  Sensory: Sensation is symmetric to light touch and temperature in the arms and legs.  No sensory level on her back Deep Tendon Reflexes: 2+ and symmetric in the biceps, brachioradialis, patellae, Achilles.  No clonus.  No Hoffman's. Plantars: Toes are downgoing bilaterally.  Cerebellar: FNF and HKS are intact bilaterally    I have reviewed labs in epic and the results pertinent to this consultation are: Beta-HCG neg HIV neg SARSCOV2 neg  I have personally reviewed the images obtained: MRI brain w/ small white matter T2 hyperintensities mostly in the right frontal lobe but also one in the left frontal lobe, no pathological enhancment MRI orbits w/ 9 mm enhancement of the right optic nerve MRI cervical and thoracic spine w/o cord signal changes or pathological enhancement  CXR clear   Impression: This is a 50 year old woman with a past medical history significant for Hashimoto's thyroiditis presenting with right optic neuritis.  She does not meet McDonald criteria for a diagnosis of multiple sclerosis at this time given this is her first focal neurological symptom and her MRI does not show clear evidence of dissemination in space.  Also in the differential is NMO.  Infection seems relatively unlikely given lack of any infectious symptoms.  Lumbar puncture for oligoclonal bands could help clarify the diagnosis, the patient is fairly reluctant  to pursue LP at this time and would prefer to wait for serum test results for aquaporin-4 and Mog antibodies.  Additionally given her reports of dry mouth/dry and possible photosensitivity, will complete a basic rheumatological screening as well (Sjogren antibodies and ANA for SLE).  Side effects of steroids were discussed with the patient.  MRI brain and orbits were personally reviewed with the patient.  Patient was counseled on importance of COVID-19 vaccination and reports she remains firmly against vaccination at this time  Recommendations: - HIV, RPR - NMO and MOG serum testing - [place]  - ANA - Pulse dose steroids (1000 mg Solu-Medrol IV daily x5 days) -- note that if the patient tolerates the first few days well, option for discharge with completion of course via oral dosing is reasonable - While on pulse dose steroids, sliding scale insulin and Protonix 40 mg twice daily  Lesleigh Noe MD-PhD [facility] [phone]

## 2020-04-24 NOTE — H&P (Addendum)
History and Physical    Drinda Belgard KVQ:[id number] DOB: 1970-11-22 DOA: 04/24/2020  PCP: [place], [place] (Confirm with patient/family/NH records and if not entered, this has to be entered at [facility] point of entry) Patient coming from: [facility]  I have personally briefly reviewed patient's old medical records in [place]  Chief Complaint: Right eye can't see  HPI: Valerie [name] is a 50 y.o. female with medical history significant of hypothyroidism, family history of multiple sclerosis on maternal mother side, presented with new onset of right eye pain and vision loss.  Symptoms started 7 to 10 days ago, initially pt was having some URI symtpoms such as dry cough, stuffy nose, then she developed episodes of throbbing-like right eye pain associated with blurry vision episodic, resolved spontaneously.  The last 2 days, she has been experiencing more frequent such episodes, and new onset of oversensitive to light.  And the right-sided vision change has become permanent this morning, which she is described as " Images erased here and there and now there is nothing".  Headache however subsided.  She denied any feeling of nauseous vomiting numbness weakness any complaints or hearing changes.  No fever chills or neck pain.  Patient went to see ophthalmologist this morning on emergency basis and was recommended direct admission for MS work-up with MRI and IV steroid.  Review of Systems: As per HPI otherwise 14 point review of systems negative.    Past Medical History:  Diagnosis Date  . Hypothyroidism     Past Surgical History:  Procedure Laterality Date  . TUBAL LIGATION       reports that she has never smoked. She has never used smokeless tobacco. She reports that she does not drink alcohol and does not use drugs.  No Known Allergies  Family History  Problem Relation Age of Onset  . Breast cancer Neg Hx     Prior to Admission medications   Medication Sig Start  Date End Date Taking? Authorizing Provider  cholecalciferol (VITAMIN D3) 25 MCG (1000 UNIT) tablet Take 1,000 Units by mouth daily.   Yes [provider]  levothyroxine (SYNTHROID) 75 MCG tablet Take 75 mcg by mouth daily. 03/16/20  Yes [provider]  Multiple Vitamins-Minerals (ZINC PO) Take 1 tablet by mouth daily.   Yes [provider]  Probiotic Product (CULTRELLE KIDS IMMUNE DEFENSE PO) Take 1 tablet by mouth daily.   Yes [provider]  TURMERIC PO Take 1 tablet by mouth daily.   Yes [provider]  vitamin C (ASCORBIC ACID) 250 MG tablet Take 250 mg by mouth daily.   Yes [provider]    Physical Exam: Vitals:   04/24/20 1744  BP: 118/82  Pulse: 93  Resp: 17  Temp: 98.3 F (36.8 C)  TempSrc: Oral  SpO2: 95%  Weight: 76.2 kg  Height: 5\' 6"  (1.676 m)    Constitutional: NAD, calm, comfortable Vitals:   04/24/20 1744  BP: 118/82  Pulse: 93  Resp: 17  Temp: 98.3 F (36.8 C)  TempSrc: Oral  SpO2: 95%  Weight: 76.2 kg  Height: 5\' 6"  (1.676 m)   Eyes: Right pupil dilated and very sluggish to light ENMT: Mucous membranes are moist. Posterior pharynx clear of any exudate or lesions.Normal dentition.  Neck: normal, supple, no masses, no thyromegaly Respiratory: clear to auscultation bilaterally, no wheezing, no crackles. Normal respiratory effort. No accessory muscle use.  Cardiovascular: Regular rate and rhythm, no murmurs / rubs / gallops.  No extremity edema. 2+ pedal pulses. No carotid bruits.  Abdomen: no tenderness, no masses palpated. No hepatosplenomegaly. Bowel sounds positive.  Musculoskeletal: no clubbing / cyanosis. No joint deformity upper and lower extremities. Good ROM, no contractures. Normal muscle tone.  Skin: no rashes, lesions, ulcers. No induration Neurologic: CN 2-12 grossly intact. Sensation intact, DTR normal. Strength 5/5 in all 4.  Psychiatric: Normal judgment and insight. Alert and oriented x  3. Normal mood.     Labs on Admission: I have personally reviewed following labs and imaging studies  CBC: No results for input(s): WBC, NEUTROABS, HGB, HCT, MCV, PLT in the last 168 hours. Basic Metabolic Panel: No results for input(s): NA, K, CL, CO2, GLUCOSE, BUN, CREATININE, CALCIUM, MG, PHOS in the last 168 hours. GFR: CrCl cannot be calculated (No successful lab value found.). Liver Function Tests: No results for input(s): AST, ALT, ALKPHOS, BILITOT, PROT, ALBUMIN in the last 168 hours. No results for input(s): LIPASE, AMYLASE in the last 168 hours. No results for input(s): AMMONIA in the last 168 hours. Coagulation Profile: No results for input(s): INR, PROTIME in the last 168 hours. Cardiac Enzymes: No results for input(s): CKTOTAL, CKMB, CKMBINDEX, TROPONINI in the last 168 hours. BNP (last 3 results) No results for input(s): PROBNP in the last 8760 hours. HbA1C: No results for input(s): HGBA1C in the last 72 hours. CBG: No results for input(s): GLUCAP in the last 168 hours. Lipid Profile: No results for input(s): CHOL, HDL, LDLCALC, TRIG, CHOLHDL, LDLDIRECT in the last 72 hours. Thyroid Function Tests: No results for input(s): TSH, T4TOTAL, FREET4, T3FREE, THYROIDAB in the last 72 hours. Anemia Panel: No results for input(s): VITAMINB12, FOLATE, FERRITIN, TIBC, IRON, RETICCTPCT in the last 72 hours. Urine analysis: No results found for: COLORURINE, APPEARANCEUR, LABSPEC, PHURINE, GLUCOSEU, HGBUR, BILIRUBINUR, KETONESUR, PROTEINUR, UROBILINOGEN, NITRITE, LEUKOCYTESUR  Radiological Exams on Admission: No results found.  EKG: Ordered  Assessment/Plan Active Problems:   Multiple sclerosis (HCC)  (please populate well all problems here in Problem List. (For example, if patient is on BP meds at home and you resume or decide to hold them, it is a problem that needs to be her. Same for CAD, COPD, HLD and so on)  Right eye optic neuritis most likely secondary to new  onset of multiple sclerosis -Discussed with on-call neurologist, recommend MRI brain, orbital, cervical and thoracic spine with and without contrast further define the extensiveness. -Neurology to decide when to start steroids after MRI. Send UA, Chest xray and CBC, clinically low suspicion for active infection -Pregnancy test sent.  Hypothyroidism -Continue Synthroid  DVT prophylaxis: Lovenox Code Status: Full Family Communication: Husband at bedside Disposition Plan: 3 days inpatient IV steroid Consults called: Neurology Admission status: MedSurgery   Lequita Halt MD [facility] Pager [phone]  04/24/2020, 7:17 PM

## 2020-04-24 NOTE — Progress Notes (Addendum)
Patient arrived to unit. Vitals will be taken by NT and patient will be made comfortable until MD comes to her room.  Paging out to Dr. Roosevelt Locks for patient admission.

## 2020-04-25 DIAGNOSIS — H469 Unspecified optic neuritis: Principal | ICD-10-CM

## 2020-04-25 DIAGNOSIS — E039 Hypothyroidism, unspecified: Secondary | ICD-10-CM

## 2020-04-25 LAB — COMPREHENSIVE METABOLIC PANEL
ALT: 22 U/L (ref 0–44)
AST: 20 U/L (ref 15–41)
Albumin: 3.4 g/dL — ABNORMAL LOW (ref 3.5–5.0)
Alkaline Phosphatase: 36 U/L — ABNORMAL LOW (ref 38–126)
Anion gap: 8 (ref 5–15)
BUN: 14 mg/dL (ref 6–20)
CO2: 27 mmol/L (ref 22–32)
Calcium: 8.5 mg/dL — ABNORMAL LOW (ref 8.9–10.3)
Chloride: 102 mmol/L (ref 98–111)
Creatinine, Ser: 0.92 mg/dL (ref 0.44–1.00)
GFR, Estimated: >60 mL/min (ref >60)
Glucose, Bld: 110 mg/dL — ABNORMAL HIGH (ref 70–99)
Potassium: 3.7 mmol/L (ref 3.5–5.1)
Sodium: 137 mmol/L (ref 135–145)
Total Bilirubin: 0.5 mg/dL (ref 0.3–1.2)
Total Protein: 6.6 g/dL (ref 6.5–8.1)

## 2020-04-25 LAB — CBC
HCT: 41.2 % (ref 36.0–46.0)
Hemoglobin: 14 g/dL (ref 12.0–15.0)
MCH: 32.3 pg (ref 26.0–34.0)
MCHC: 34 g/dL (ref 30.0–36.0)
MCV: 94.9 fL (ref 80.0–100.0)
Platelets: 257 10*3/uL (ref 150–400)
RBC: 4.34 MIL/uL (ref 3.87–5.11)
RDW: 12.1 % (ref 11.5–15.5)
WBC: 7.3 10*3/uL (ref 4.0–10.5)
nRBC: 0 % (ref 0.0–0.2)

## 2020-04-25 LAB — URINALYSIS, ROUTINE W REFLEX MICROSCOPIC
Bilirubin Urine: NEGATIVE
Glucose, UA: NEGATIVE mg/dL
Hgb urine dipstick: NEGATIVE
Ketones, ur: 20 mg/dL — AB
Leukocytes,Ua: NEGATIVE
Nitrite: NEGATIVE
Protein, ur: NEGATIVE mg/dL
Specific Gravity, Urine: 1.029 (ref 1.005–1.030)
pH: 5 (ref 5.0–8.0)

## 2020-04-25 LAB — RPR: RPR Ser Ql: NONREACTIVE

## 2020-04-25 LAB — GLUCOSE, CAPILLARY
Glucose-Capillary: 116 mg/dL — ABNORMAL HIGH (ref 70–99)
Glucose-Capillary: 118 mg/dL — ABNORMAL HIGH (ref 70–99)
Glucose-Capillary: 166 mg/dL — ABNORMAL HIGH (ref 70–99)
Glucose-Capillary: 160 mg/dL — ABNORMAL HIGH (ref 70–99)

## 2020-04-25 LAB — HEMOGLOBIN A1C
Hgb A1c MFr Bld: 5.4 % (ref 4.8–5.6)
Mean Plasma Glucose: 108.28 mg/dL

## 2020-04-25 NOTE — Plan of Care (Signed)

## 2020-04-25 NOTE — Progress Notes (Signed)
   04/25/20 1345  Clinical Encounter Type  Visited With Patient  Visit Type Initial  Referral From Nurse  Consult/Referral To Chaplain   Chaplain responded to consult request. Chaplain provided AD education. Pt said she would look it over and fill it out. Chaplain explained that volunteers and notaries and limited in the hospital right now, so she might have to wait until she goes home. Pt was agreeable stating she had a friend who's a Patent examiner. Chaplain remains available.  This note was prepared by Chaplain Resident, Dante Gang, MDiv. Chaplain remains available as needed through the on-call pager: [phone].

## 2020-04-25 NOTE — Progress Notes (Signed)
PROGRESS NOTE    Valerie [name]  JOI:[id number] DOB: Jan 22, 1971 DOA: 04/24/2020 PCP: [place], [place]    Brief Narrative:  Valerie [name] was admitted to the hospital with a working diagnosis of acute right optic neuritis.  50 year old female with past medical history for hypothyroidism, who presents with 7 to 10 days of dry cough, nasal congestion and right eye pain.  Visual changes more predominantly for the last 2 days with light sensitivity, decreased vision. As an outpatient she was evaluated by ophthalmologist, she was referred emergently to the hospital, for further work-up for multiple sclerosis, intravenous steroids and brain MRI. On her initial physical examination blood pressure 118/82, heart rate 93, respiratory rate 17, temperature 98.3, oxygen saturation 95%, she had clear lungs auscultation, heart S1-S2 were present rhythmic, soft abdomen, no lower extremity edema.  Her right pupil was dilated and poorly responsive to light.  Orbit MRI with abnormal contrast enhancement of the intraorbital right optic nerve consistent with optic neuritis. Normal brain and spine (cervical and thoracic ) MRI.  Neurology was consulted with recommendations for pulse dose steroids 1000 mg daily for 5 days.  She preferred to wait on lumbar puncture until serum test resulted (aquaporin 4 and Mog antibodies).   Assessment & Plan:   Principal Problem:   Optic neuritis, right Active Problems:   Hypothyroid  1. Acute right optic neuritis. She reports improvement in eye pain, but continue to have decreased vision.   Continue with high dose steroids 1000 mg methylprednisolone IV daily for 5 days. Continue close neurologic monitoring. Patient does not have criteria for multiple sclerosis per Neurology evaluation.   Gi prophylaxis with pantoprazole.   2. Hypothyroid. Continue with levothyroxine.   3. Steroid induced hyperglycemia. ,mild elevation of serum glucose, fasting 110, and  capillary  116 and 118. Continue with sliding scale insulin therapy for glucose cover and monitoring, hold on basal or pre-meal insulin for now.    Patient continue to be at high risk for worsening neuritis   Status is: Inpatient  Remains inpatient appropriate because:IV treatments appropriate due to intensity of illness or inability to take PO   Dispo: The patient is from: Home              Anticipated d/c is to: Home              Anticipated d/c date is: > 3 days              Patient currently is not medically stable to d/c.   DVT prophylaxis: Enoxaparin   Code Status:   full  Family Communication:  No family at the bedside      Consultants:   Neurology    Subjective: Patient reports improvement in right pain, but continue to have decreased vision, no headache, no nausea or vomiting. No tremors.   Objective: Vitals:   04/24/20 2325 04/24/20 2327 04/25/20 0234 04/25/20 0605  BP: 119/75 114/76 110/71 110/71  Pulse: 94 87 75 72  Resp: 18 18 16 17   Temp: 98.2 F (36.8 C) 98.9 F (37.2 C) 97.6 F (36.4 C) 98.7 F (37.1 C)  TempSrc:  Oral Oral Oral  SpO2: 97% 97% 98% 100%  Weight:      Height:        Intake/Output Summary (Last 24 hours) at 04/25/2020 1409 Last data filed at 04/25/2020 0900 Gross per 24 hour  Intake 360 ml  Output 200 ml  Net 160 ml   [employer]  04/24/20 1744  Weight: 76.2 kg    Examination:   General: Not in pain or dyspnea,.  Neurology: Awake and alert, non focal  E ENT: no pallor, no icterus, oral mucosa moist Cardiovascular: No JVD. S1-S2 present, rhythmic, no gallops, rubs, or murmurs. No lower extremity edema. Pulmonary: vesicular breath sounds bilaterally Gastrointestinal. Abdomen soft and non tender Skin. No rashes Musculoskeletal: no joint deformities     Data Reviewed: I have personally reviewed following labs and imaging studies  CBC: Recent Labs  Lab 04/25/20 0009  WBC 7.3  HGB 14.0  HCT 41.2  MCV 94.9   PLT 628   Basic Metabolic Panel: Recent Labs  Lab 04/25/20 0009  NA 137  K 3.7  CL 102  CO2 27  GLUCOSE 110*  BUN 14  CREATININE 0.92  CALCIUM 8.5*   GFR: Estimated Creatinine Clearance: 76.3 mL/min (by C-G formula based on SCr of 0.92 mg/dL). Liver Function Tests: Recent Labs  Lab 04/25/20 0009  AST 20  ALT 22  ALKPHOS 36*  BILITOT 0.5  PROT 6.6  ALBUMIN 3.4*   No results for input(s): LIPASE, AMYLASE in the last 168 hours. No results for input(s): AMMONIA in the last 168 hours. Coagulation Profile: No results for input(s): INR, PROTIME in the last 168 hours. Cardiac Enzymes: No results for input(s): CKTOTAL, CKMB, CKMBINDEX, TROPONINI in the last 168 hours. BNP (last 3 results) No results for input(s): PROBNP in the last 8760 hours. HbA1C: Recent Labs    04/25/20 0009  HGBA1C 5.4   CBG: Recent Labs  Lab 04/25/20 0839 04/25/20 1238  GLUCAP 116* 118*   Lipid Profile: No results for input(s): CHOL, HDL, LDLCALC, TRIG, CHOLHDL, LDLDIRECT in the last 72 hours. Thyroid Function Tests: No results for input(s): TSH, T4TOTAL, FREET4, T3FREE, THYROIDAB in the last 72 hours. Anemia Panel: No results for input(s): VITAMINB12, FOLATE, FERRITIN, TIBC, IRON, RETICCTPCT in the last 72 hours.    Radiology Studies: I have reviewed all of the imaging during this hospital visit personally     Scheduled Meds: . vitamin C  250 mg Oral Daily  . cholecalciferol  1,000 Units Oral Daily  . enoxaparin (LOVENOX) injection  40 mg Subcutaneous Q24H  . insulin aspart  0-15 Units Subcutaneous TID WC  . levothyroxine  75 mcg Oral QAC breakfast  . pantoprazole  40 mg Oral BID  . saccharomyces boulardii  250 mg Oral BID  . zinc sulfate  220 mg Oral Daily   Continuous Infusions: . methylPREDNISolone (SOLU-MEDROL) injection 1,000 mg (04/25/20 1108)     LOS: 1 day        Katelyn Kohlmeyer Gerome Apley, MD

## 2020-04-25 NOTE — Progress Notes (Signed)
Brief HPI: Valerie [name] is a 50 year old female with a history of Hashimoto's thyroiditis (last TSH 2.59 in July 2021) presented to the ER 04/24/20 for complaints of right eye pain for approximately two weeks with blurred vision and photophobia she noticed first on Saturday 1/8. She describes her vision as looking at a paper with pencil writing that has been incompletely erased in her right eye. She endorses minimal improvement of eye pain with Motrin at home. She was seen by ophthalmology outpatient who advised her to come to the ER with concerns of optic neuritis.  MRI 1/12 consistent with optic neuritis without evidence of demyelinating disease or lesions consistent with MS.   Subjective: Valerie [name] is awake, sitting in bed this morning without acute distress. She endorses continued eye pain, photophobia, and blurry vision in the right eye without improvement or progression. She claims her eye feels sore this morning and endorses more pain with looking laterally at examiner.  Lab results without evidence of infectious process, pending her first dose of SoluMedrol this morning. Spoke with Ms. Vandergriff and her mother about side effects of steroids and pending serum lab tests.   She is not vaccinated against COVID-19.  Exam: Vitals:   04/25/20 0234 04/25/20 0605  BP: 110/71 110/71  Pulse: 75 72  Resp: 16 17  Temp: 97.6 F (36.4 C) 98.7 F (37.1 C)  SpO2: 98% 100%   Gen: Awake, sitting up in bed, no acute distress. Slightly anxious about hospitalization and diagnosis but appropriate to situation.  Resp: non-labored breathing, symmetric chest rise with inspiration Abd: soft, non-distended, non-tender  Neuro: MS: Awake, alert, and oriented to person, place, time, and situation. Comprehension intact. Patient is a good historian and is able to give a clear and coherent history. Speech is clear without aphasia or dysarthria. CN:  Visual acuity in the right eye with significant impairment; she  describes her vision as seeing everything as "incompletely erased" and is only able to see large print. Pupils are 60mm and briskly reactive bilaterally with right eye afferent pupillary defect.  EOMI with pain in the right eye increased with lateral eye movement. Denies diplopia. No evidence of ptosis. Face symmetric resting and smiling with sensation intact bilaterally. Hearing intact to voice. Tongue protrudes midline. Motor: Moves all extremities spontaneously. Antigravity movement intact without pronator drift. Bulk and tone are normal. Sensory: Sensation present and equal upper and lower extremities.  Cerebellar: FNF and HKS intact bilaterally Gait: normal, steady without use of assistive device  Pertinent Labs: CBC without leukocytosis  Blood glucose 116 after breakfast this morning; before first dose steroids UA without evidence of UTI HIV negative SARS COVID negative  Assessment: 50 year old female presenting with right eye pain for 2 weeks and right eye blurred vision for approximately 4 days with photophobia and increased pain with eye movement.  MRI with evidence of optic neuritis and without evidence to support MS.  Lab results without evidence of infection. Mainstay of treatment is steroids to be initiated inpatient with evaluation of patient tolerance and may be able to be completed in the outpatient setting if outpatient infusion is possible.  Impression:  Optic neuritis with right eye pain and visual deficits Family history of Multiple Sclerosis (paternal grandmother)  Personal history of autoimmune disease: Hashimoto's  Recommendations: 1. 1,000 mg Solu-Medrol IV daily x 5 days; consider outpatient infusion if possible 2. Continue SSI and Protonix during steroid treatment 3. NMO/MOG, Ro/La, ANA labs pending 4. Outpatient f/u with Dr. Felecia Shelling. 5. family had  questions about COVID vaccination.  Given the current acute demyelinating event, I would hold off on this and have  continuing following discussions on outpatient neurology follow-up.  Anibal Henderson, AGACNP-BC [facility] [phone]  Attending Neurohospitalist Addendum Patient seen and examined with APP/Resident. Agree with the history and physical as documented above. Agree with the plan as documented, which I helped formulate. I have independently reviewed the chart, obtained history, review of systems and examined the patient.I have personally reviewed pertinent head/neck/spine imaging (CT/MRI). Answered all questions that the patient had as well as her mother's questions over FaceTime call. Please feel free to call with any questions. --- Amie Portland, MD [facility] Pager: [phone]  If 7pm to 7am, please call on call as listed on AMION.

## 2020-04-26 DIAGNOSIS — E039 Hypothyroidism, unspecified: Secondary | ICD-10-CM | POA: Diagnosis not present

## 2020-04-26 LAB — GLUCOSE, CAPILLARY
Glucose-Capillary: 134 mg/dL — ABNORMAL HIGH (ref 70–99)
Glucose-Capillary: 127 mg/dL — ABNORMAL HIGH (ref 70–99)
Glucose-Capillary: 151 mg/dL — ABNORMAL HIGH (ref 70–99)
Glucose-Capillary: 177 mg/dL — ABNORMAL HIGH (ref 70–99)

## 2020-04-26 LAB — ANA W/REFLEX IF POSITIVE: Anti Nuclear Antibody (ANA): NEGATIVE

## 2020-04-26 LAB — SJOGRENS SYNDROME-A EXTRACTABLE NUCLEAR ANTIBODY: SSA (Ro) (ENA) Antibody, IgG: <0.2 AI (ref 0.0–0.9)

## 2020-04-26 LAB — SJOGRENS SYNDROME-B EXTRACTABLE NUCLEAR ANTIBODY: SSB (La) (ENA) Antibody, IgG: <0.2 AI (ref 0.0–0.9)

## 2020-04-26 NOTE — Progress Notes (Signed)
Subjective: Valerie [name] was awake, sitting up in bed in good spirits this morning during examination. She states that her eye pain has improved after her first dose of steroids yesterday from an 8/10 severity to 4/10. She also states a subjective improvement in her vision from yesterday with mild improvement in vision clarity. Denies agitation, insomnia, or irritability from steroid infusion at this time. Answered all questions/concerns from patient and mother (virtually present). Patient with concerns of outpatient infusions due to hyperglycemia treatment available inpatient but with concerns of transportation at treatment completion due to predicted weather on Sunday.   Exam: Vitals:   04/25/20 2101 04/26/20 0434  BP: 123/79 116/69  Pulse: 97 84  Resp: 17 20  Temp: 98 F (36.7 C) 98.2 F (36.8 C)  SpO2: 93% 93%   Gen: In bed, awake, alert, no acute distress Resp: non-labored breathing with symmetric chest rise during inspiration Abd: soft, non-distended, non-tender  Neuro: MS: awake, alert and oriented to time, place, situation, age. Comprehension and cognition intact. Speech is clear without aphasia or dysarthria CN: Visual acuity in the right eye with significant impairment but with minimal subjective improvement today compared to yesterday; she describes her vision as seeing everything as "incompletely erased" and is only able to see large print. Pupils are 39mm and briskly reactive bilaterally with right eye afferent pupillary defect.  EOMI with pain in the right eye increased with lateral eye movement, worse with leftward gaze; subjective improvement in pain with lateral gaze today compared to yesterday. Denies diplopia. No evidence of ptosis. Face symmetric resting and smiling with sensation intact bilaterally. Hearing intact to voice. Tongue protrudes midline. Motor: Moves all extremities spontaneously. Antigravity movement intact without pronator drift. Bulk and tone are  normal. Sensory: Sensation present and equal upper and lower extremities.  Cerebellar: FNF and HKS intact bilaterally Gait: deferred  Pertinent Labs: CBC without leukocytosis  Blood glucose 116 after breakfast this morning; before first dose steroids UA without evidence of UTI HIV negative SARS COVID negative  Assessment: 50 year old female presenting with right eye pain for 2 weeks and right eye blurred vision for approximately 4 days with photophobia and increased pain with eye movement.  MRI with evidence of optic neuritis and without evidence to support MS.  Lab results without evidence of infection. Mainstay of treatment is steroids to be initiated inpatient with evaluation of patient tolerance and may be able to be completed in the outpatient setting if outpatient infusion is possible.  Impression:  Optic neuritis with right eye pain and visual deficits Family history of Multiple Sclerosis (paternal grandmother)  Personal history of autoimmune disease: Hashimoto's  Recommendations: 1. 1,000 mg Solu-Medrol IV daily x 5 days; consider outpatient infusion, if possible.  2. Continue SSI and Protonix during steroid treatment 3. NMO/MOG, Ro/La, ANA labs pending 4. Outpatient f/u with Dr. Felecia Shelling. 5. Family had questions about COVID vaccination.  Given the current acute demyelinating event, I would hold off on this and have continuing following discussions on outpatient neurology follow-up.  Anibal Henderson, AGACNP-BC [facility] [phone]

## 2020-04-26 NOTE — Progress Notes (Signed)
PROGRESS NOTE    Dannette Kinkaid  EPP:[id number] DOB: 04-03-1971 DOA: 04/24/2020 PCP: [place], [place]    Brief Narrative:  Mrs. Blackham was admitted to the hospital with a working diagnosis of acute right optic neuritis.  50 year old female with past medical history for hypothyroidism, who presents with 7 to 10 days of dry cough, nasal congestion and right eye pain.  Visual changes more predominantly for the last 2 days with light sensitivity, decreased vision. As an outpatient she was evaluated by ophthalmologist, she was referred emergently to the hospital, for further work-up for multiple sclerosis, intravenous steroids and brain MRI. On her initial physical examination blood pressure 118/82, heart rate 93, respiratory rate 17, temperature 98.3, oxygen saturation 95%, she had clear lungs auscultation, heart S1-S2 were present rhythmic, soft abdomen, no lower extremity edema.  Her right pupil was dilated and poorly responsive to light.  Orbit MRI with abnormal contrast enhancement of the intraorbital right optic nerve consistent with optic neuritis. Normal brain and spine (cervical and thoracic ) MRI.  Neurology was consulted with recommendations for pulse dose steroids 1000 mg daily for 5 days.  She preferred to wait on lumbar puncture until serum test resulted (aquaporin 4 and Mog antibodies).     Assessment & Plan:   Principal Problem:   Optic neuritis, right Active Problems:   Hypothyroid   1. Acute right optic neuritis. Her right eye vision has been improving, pain more controlled,   Tolerating well high dose steroids 1000 mg methylprednisolone IV daily #2/5 Close neurologic monitoring. Patient does not have criteria for multiple sclerosis per Neurology evaluation.   Continue with Gi prophylaxis with pantoprazole.   2. Hypothyroid. On levothyroxine.   3. Steroid induced hyperglycemia. Continue with sliding scale insulin therapy for glucose cover  and monitoring,   Fasting glucose is 134 this am, peak at 166 yesterday evening.      Status is: Inpatient  Remains inpatient appropriate because:IV treatments appropriate due to intensity of illness or inability to take PO   Dispo: The patient is from: Home              Anticipated d/c is to: Home              Anticipated d/c date is: 3 days              Patient currently is not medically stable to d/c.   DVT prophylaxis: Enoxaparin  Code Status:   full  Family Communication:  No family at the bedside       Consultants:   Neurology    Subjective: Patient is feeling better, right eye vision is improving but not yet back to baseline, no nausea or vomiting,   Objective: Vitals:   04/25/20 0605 04/25/20 1609 04/25/20 2101 04/26/20 0434  BP: 110/71 122/81 123/79 116/69  Pulse: 72 89 97 84  Resp: 17 18 17 20   Temp: 98.7 F (37.1 C) 98.3 F (36.8 C) 98 F (36.7 C) 98.2 F (36.8 C)  TempSrc: Oral   Oral  SpO2: 100% 95% 93% 93%  Weight:      Height:        Intake/Output Summary (Last 24 hours) at 04/26/2020 1536 Last data filed at 04/26/2020 0800 Gross per 24 hour  Intake 298 ml  Output -  Net 298 ml   Filed Weights   04/24/20 1744  Weight: 76.2 kg    Examination:   General: Not in pain or dyspnea.  Neurology: Awake  and alert, non focal  E ENT: no pallor, no icterus, oral mucosa moist Cardiovascular: No JVD. S1-S2 present, rhythmic, no gallops, rubs, or murmurs. No lower extremity edema. Pulmonary: positive  breath sounds bilaterally, Gastrointestinal. Abdomen soft and non tender Skin. No rashes Musculoskeletal: no joint deformities     Data Reviewed: I have personally reviewed following labs and imaging studies  CBC: Recent Labs  Lab 04/25/20 0009  WBC 7.3  HGB 14.0  HCT 41.2  MCV 94.9  PLT 431   Basic Metabolic Panel: Recent Labs  Lab 04/25/20 0009  NA 137  K 3.7  CL 102  CO2 27  GLUCOSE 110*  BUN 14  CREATININE 0.92  CALCIUM  8.5*   GFR: Estimated Creatinine Clearance: 76.3 mL/min (by C-G formula based on SCr of 0.92 mg/dL). Liver Function Tests: Recent Labs  Lab 04/25/20 0009  AST 20  ALT 22  ALKPHOS 36*  BILITOT 0.5  PROT 6.6  ALBUMIN 3.4*   No results for input(s): LIPASE, AMYLASE in the last 168 hours. No results for input(s): AMMONIA in the last 168 hours. Coagulation Profile: No results for input(s): INR, PROTIME in the last 168 hours. Cardiac Enzymes: No results for input(s): CKTOTAL, CKMB, CKMBINDEX, TROPONINI in the last 168 hours. BNP (last 3 results) No results for input(s): PROBNP in the last 8760 hours. HbA1C: Recent Labs    04/25/20 0009  HGBA1C 5.4   CBG: Recent Labs  Lab 04/25/20 1238 04/25/20 1718 04/25/20 2059 04/26/20 0732 04/26/20 1215  GLUCAP 118* 166* 160* 134* 127*   Lipid Profile: No results for input(s): CHOL, HDL, LDLCALC, TRIG, CHOLHDL, LDLDIRECT in the last 72 hours. Thyroid Function Tests: No results for input(s): TSH, T4TOTAL, FREET4, T3FREE, THYROIDAB in the last 72 hours. Anemia Panel: No results for input(s): VITAMINB12, FOLATE, FERRITIN, TIBC, IRON, RETICCTPCT in the last 72 hours.    Radiology Studies: I have reviewed all of the imaging during this hospital visit personally     Scheduled Meds: . vitamin C  250 mg Oral Daily  . cholecalciferol  1,000 Units Oral Daily  . enoxaparin (LOVENOX) injection  40 mg Subcutaneous Q24H  . insulin aspart  0-15 Units Subcutaneous TID WC  . levothyroxine  75 mcg Oral QAC breakfast  . pantoprazole  40 mg Oral BID  . saccharomyces boulardii  250 mg Oral BID  . zinc sulfate  220 mg Oral Daily   Continuous Infusions: . methylPREDNISolone (SOLU-MEDROL) injection 1,000 mg (04/26/20 0848)     LOS: 2 days        Mauricio Gerome Apley, MD

## 2020-04-27 DIAGNOSIS — G35 Multiple sclerosis: Secondary | ICD-10-CM | POA: Diagnosis not present

## 2020-04-27 DIAGNOSIS — E039 Hypothyroidism, unspecified: Secondary | ICD-10-CM | POA: Diagnosis not present

## 2020-04-27 DIAGNOSIS — H469 Unspecified optic neuritis: Secondary | ICD-10-CM | POA: Diagnosis not present

## 2020-04-27 LAB — MISC LABCORP TEST (SEND OUT): Labcorp test code: 505310

## 2020-04-27 LAB — GLUCOSE, CAPILLARY
Glucose-Capillary: 110 mg/dL — ABNORMAL HIGH (ref 70–99)
Glucose-Capillary: 123 mg/dL — ABNORMAL HIGH (ref 70–99)
Glucose-Capillary: 183 mg/dL — ABNORMAL HIGH (ref 70–99)
Glucose-Capillary: 160 mg/dL — ABNORMAL HIGH (ref 70–99)

## 2020-04-27 LAB — NEUROMYELITIS OPTICA AUTOAB, IGG: NMO-IgG: <1.5 U/mL (ref 0.0–3.0)

## 2020-04-27 NOTE — Progress Notes (Signed)
Neurology progress note  Subjective: Seen and examined Improvement in both pain and visual acuity  Exam: Vitals:   04/26/20 2059 04/27/20 0638  BP: 123/74 115/74  Pulse: 85 70  Resp: 20 20  Temp: 98.6 F (37 C) 98.3 F (36.8 C)  SpO2: 95% 96%   Gen: In bed, awake, alert, no acute distress Resp: non-labored breathing with symmetric chest rise during inspiration Abd: soft, non-distended, non-tender  Neuro: MS: awake, alert and oriented to time, place, situation, age. Comprehension and cognition intact. Speech is clear without aphasia or dysarthria CN: Visual acuity in the right eye with significant impairment but with minimal subjective improvement today compared to yesterday; much more clear today with her vision and is able to read a chart placed at least 15 feet away. Pupils are 5mm and briskly reactive bilaterally with right eye afferent pupillary defect.  No EOM restriction. Face symmetric resting and smiling with sensation intact bilaterally. Hearing intact to voice. Tongue protrudes midline. Motor: Moves all extremities spontaneously. Antigravity movement intact without pronator drift. Bulk and tone are normal. Sensory: Sensation present and equal upper and lower extremities.  Cerebellar: FNF and HKS intact bilaterally Gait: deferred  Pertinent Labs: CBC    Component Value Date/Time   WBC 7.3 04/25/2020 0009   RBC 4.34 04/25/2020 0009   HGB 14.0 04/25/2020 0009   HCT 41.2 04/25/2020 0009   PLT 257 04/25/2020 0009   MCV 94.9 04/25/2020 0009   MCH 32.3 04/25/2020 0009   MCHC 34.0 04/25/2020 0009   RDW 12.1 04/25/2020 0009   CMP Latest Ref Rng & Units 04/25/2020  Glucose 70 - 99 mg/dL 110(H)  BUN 6 - 20 mg/dL 14  Creatinine 0.44 - 1.00 mg/dL 0.92  Sodium 135 - 145 mmol/L 137  Potassium 3.5 - 5.1 mmol/L 3.7  Chloride 98 - 111 mmol/L 102  CO2 22 - 32 mmol/L 27  Calcium 8.9 - 10.3 mg/dL 8.5(L)  Total Protein 6.5 - 8.1 g/dL 6.6  Total Bilirubin 0.3 - 1.2 mg/dL 0.5   Alkaline Phos 38 - 126 U/L 36(L)  AST 15 - 41 U/L 20  ALT 0 - 44 U/L 22   ANA negative SSA SSB negative NMO antibodies and MoG antibodies pending.  Assessment: 50 year old female presenting with right eye pain for 2 weeks and right eye blurred vision for approximately 4 days with photophobia and increased pain with eye movement.  MRI with evidence of optic neuritis-not meeting McDonald criteria yet to be diagnosed as MS.  Lab results without evidence of infection.   Impression:  Optic neuritis with right eye pain and visual deficits Family history of Multiple Sclerosis (paternal grandmother)  Personal history of autoimmune disease: Hashimoto's  Recommendations: 1,000 mg Solu-Medrol IV daily x 5 days (today is D3)-given restrictions from the pandemic as well as weekend, unable to coordinate outpatient infusions. Continue SSI and Protonix during steroid treatment Outpatient f/u with Dr. Felecia Shelling in 2 to 4 weeks after discharge. At that time, I am hoping all the labs will be back and a discussion about disease modifying therapy could be made. Plan relayed to Dr. Cathlean Sauer via secure chat  -- Amie Portland, MD Neurologist [facility] Pager: [phone]

## 2020-04-27 NOTE — Progress Notes (Signed)
Attempted IV stick 2x. Did not work. IV team consulted.

## 2020-04-27 NOTE — Progress Notes (Signed)
PROGRESS NOTE    Valerie [name]  VZD:[id number] DOB: [birth year] DOA: 04/24/2020 PCP: Valerie [name], [place]    Brief Narrative:  Mrs. Valerie [name] was admitted to the hospital with a working diagnosis of acute right optic neuritis.  50 year old female with past medical history for hypothyroidism, who presents with 7 to 10 days of dry cough, nasal congestion and right eye pain. Visual changes more predominantly for the last 2 days with light sensitivity, decreased vision. As an outpatient she was evaluated by ophthalmologist, she was referred emergently to the hospital, for further work-up for multiple sclerosis, intravenous steroids and brain MRI. On her initial physical examination blood pressure 118/82, heart rate 93, respiratory rate 17, temperature 98.3, oxygen saturation 95%, she had clear lungs auscultation, heart S1-S2 were present rhythmic, soft abdomen, no lower extremity edema. Her right pupil was dilated and poorly responsive to light.  Orbit MRI with abnormal contrast enhancement of the intraorbital right optic nerve consistent with optic neuritis. Normal brain and spine(cervical and thoracic)MRI.  Neurology was consultedwith recommendations for pulse dose steroids 1000 mg daily for 5 days. She preferred to wait on lumbar puncture until serum test resulted (aquaporin 4 and Mog antibodies).   Assessment & Plan:   Principal Problem:   Optic neuritis, right Active Problems:   Hypothyroid   1. Acute right optic neuritis. Continue to improve vision and pain is almost resolved.   Continue with high dose steroids 1000 mg methylprednisolone IV daily #3/5 Neurologic monitoring per unit protocol.  Patient does not have criteria for multiple sclerosis per Neurology evaluation.   On pantoprazole.   2. Hypothyroid. Continue with levothyroxine.   3. Steroid induced hyperglycemia. Capillary glucose continue to be stable 177, 110, 123. Patient is tolerating po  well.     Status is: Inpatient  Remains inpatient appropriate because:IV treatments appropriate due to intensity of illness or inability to take PO   Dispo: The patient is from: Home              Anticipated d/c is to: Home              Anticipated d/c date is: 2 days              Patient currently is not medically stable to d/c.   DVT prophylaxis: Enoxaparin   Code Status:   full  Family Communication:  No family at the bedside     Consultants:   Neurology    Subjective: Patient reports improvement in pain and vision on the right eye, no nausea or vomiting, no chest pain or dyspnea.   Objective: Vitals:   04/26/20 0434 04/26/20 1604 04/26/20 2059 04/27/20 0638  BP: 116/69 125/83 123/74 115/74  Pulse: 84 92 85 70  Resp: 20 18 20 20   Temp: 98.2 F (36.8 C) 98.6 F (37 C) 98.6 F (37 C) 98.3 F (36.8 C)  TempSrc: Oral Oral    SpO2: 93% 94% 95% 96%  Weight:      Height:        Intake/Output Summary (Last 24 hours) at 04/27/2020 1525 Last data filed at 04/27/2020 1009 Gross per 24 hour  Intake 780 ml  Output -  Net 780 ml   Filed Weights   04/24/20 1744  Weight: 76.2 kg    Examination:   General: Not in pain or dyspnea,  Neurology: Awake and alert, non focal  E ENT: mild pallor, no icterus, oral mucosa moist Cardiovascular: No JVD. S1-S2 present, rhythmic, no  gallops, rubs, or murmurs. No lower extremity edema. Pulmonary: positive breath sounds bilaterally, adequate air movement, no wheezing, rhonchi or rales. Gastrointestinal. Abdomen soft and non tender Skin. No rashes Musculoskeletal: no joint deformities     Data Reviewed: I have personally reviewed following labs and imaging studies  CBC: Recent Labs  Lab 04/25/20 0009  WBC 7.3  HGB 14.0  HCT 41.2  MCV 94.9  PLT 240   Basic Metabolic Panel: Recent Labs  Lab 04/25/20 0009  NA 137  K 3.7  CL 102  CO2 27  GLUCOSE 110*  BUN 14  CREATININE 0.92  CALCIUM 8.5*   GFR: Estimated  Creatinine Clearance: 76.3 mL/min (by C-G formula based on SCr of 0.92 mg/dL). Liver Function Tests: Recent Labs  Lab 04/25/20 0009  AST 20  ALT 22  ALKPHOS 36*  BILITOT 0.5  PROT 6.6  ALBUMIN 3.4*   No results for input(s): LIPASE, AMYLASE in the last 168 hours. No results for input(s): AMMONIA in the last 168 hours. Coagulation Profile: No results for input(s): INR, PROTIME in the last 168 hours. Cardiac Enzymes: No results for input(s): CKTOTAL, CKMB, CKMBINDEX, TROPONINI in the last 168 hours. BNP (last 3 results) No results for input(s): PROBNP in the last 8760 hours. HbA1C: Recent Labs    04/25/20 0009  HGBA1C 5.4   CBG: Recent Labs  Lab 04/26/20 1215 04/26/20 1711 04/26/20 2054 04/27/20 0740 04/27/20 1218  GLUCAP 127* 151* 177* 110* 123*   Lipid Profile: No results for input(s): CHOL, HDL, LDLCALC, TRIG, CHOLHDL, LDLDIRECT in the last 72 hours. Thyroid Function Tests: No results for input(s): TSH, T4TOTAL, FREET4, T3FREE, THYROIDAB in the last 72 hours. Anemia Panel: No results for input(s): VITAMINB12, FOLATE, FERRITIN, TIBC, IRON, RETICCTPCT in the last 72 hours.    Radiology Studies: I have reviewed all of the imaging during this hospital visit personally     Scheduled Meds: . vitamin C  250 mg Oral Daily  . cholecalciferol  1,000 Units Oral Daily  . enoxaparin (LOVENOX) injection  40 mg Subcutaneous Q24H  . insulin aspart  0-15 Units Subcutaneous TID WC  . levothyroxine  75 mcg Oral QAC breakfast  . pantoprazole  40 mg Oral BID  . saccharomyces boulardii  250 mg Oral BID  . zinc sulfate  220 mg Oral Daily   Continuous Infusions: . methylPREDNISolone (SOLU-MEDROL) injection 1,000 mg (04/27/20 0855)     LOS: 3 days        Valerie [name] Apley, MD

## 2020-04-28 DIAGNOSIS — E039 Hypothyroidism, unspecified: Secondary | ICD-10-CM | POA: Diagnosis not present

## 2020-04-28 DIAGNOSIS — H469 Unspecified optic neuritis: Secondary | ICD-10-CM | POA: Diagnosis not present

## 2020-04-28 LAB — GLUCOSE, CAPILLARY
Glucose-Capillary: 94 mg/dL (ref 70–99)
Glucose-Capillary: 120 mg/dL — ABNORMAL HIGH (ref 70–99)
Glucose-Capillary: 133 mg/dL — ABNORMAL HIGH (ref 70–99)
Glucose-Capillary: 151 mg/dL — ABNORMAL HIGH (ref 70–99)

## 2020-04-28 NOTE — Progress Notes (Addendum)
Neurology progress note  Subjective: Patient on day 4/5 of pulse-dose steroids with subjective improvement in right eye vision and eye pain. States her vision has improved from hazy and blurry to now minimally blurry. Updated patient on MOG antibody positive result and plan of care moving forward with outpatient follow-up with [facility]. Patient's mother also updated via Facetime, all questions answered.   Exam: Vitals:   04/28/20 0532 04/28/20 1420  BP: 104/78 109/77  Pulse: 80 89  Resp: 16 18  Temp: 98.9 F (37.2 C) 98.8 F (37.1 C)  SpO2: 97% 94%   Gen: In bed, awake, alert, in no acute distress Resp: non-labored breathing, on room air Abd: soft, non-distended   Neuro: MS: alert and oriented to person, place, time, and situation. Comprehension and cognition intact. Speech is clear without aphasia.  CN: Visual acuity in the right eye with significant impairment but with  subjective improvement since initial admition; much more clear today with her vision and is able to read a chart placed at least 15 feet away. Pupils are 23mm and briskly reactive bilaterally with resolution of right eye afferent pupillary defect.  No EOM restriction. Face symmetric resting and smiling with sensation intact bilaterally. Hearing intact to voice. Tongue protrudes midline. Motor:Moves all extremities spontaneously. Antigravity movement intact without pronator drift. Bulk and tone are normal. Sensory:Sensation present and equal upper and lower extremities.  Cerebellar: FNF and HKS intact bilaterally Gait: deferred  Pertinent Labs: CBC    Component Value Date/Time   WBC 7.3 04/25/2020 0009   RBC 4.34 04/25/2020 0009   HGB 14.0 04/25/2020 0009   HCT 41.2 04/25/2020 0009   PLT 257 04/25/2020 0009   MCV 94.9 04/25/2020 0009   MCH 32.3 04/25/2020 0009   MCHC 34.0 04/25/2020 0009   RDW 12.1 04/25/2020 0009   CMP     Component Value Date/Time   NA 137 04/25/2020 0009   K 3.7 04/25/2020 0009   CL  102 04/25/2020 0009   CO2 27 04/25/2020 0009   GLUCOSE 110 (H) 04/25/2020 0009   BUN 14 04/25/2020 0009   CREATININE 0.92 04/25/2020 0009   CALCIUM 8.5 (L) 04/25/2020 0009   PROT 6.6 04/25/2020 0009   ALBUMIN 3.4 (L) 04/25/2020 0009   AST 20 04/25/2020 0009   ALT 22 04/25/2020 0009   ALKPHOS 36 (L) 04/25/2020 0009   BILITOT 0.5 04/25/2020 0009   GFRNONAA >60 04/25/2020 0009   ANA negative SSA SSB negative NMO antibodies pending MoG antibodies positive-see below  Misc [facility] result Miscellaneous [facility] test (send-out) Collected: 04/25/20 0009  Result status: Final  Resulting lab: [facility]  Value: COMMENT  Comment: (NOTE)  Test Ordered: 202542 Anti-MOG, Serum  MOG Antibody, Cell-based IFA  Positive  Linda.Low ]     BN    Reference Range: Negative                This test was developed and its performance characteristics  determined by [facility]. It has not been cleared or approved  by the Food and Drug Administration.  Anti-MOG Antibody Titer, Serum 1:320           BN    Reference Range: Neg: <1:10               This test was developed and its performance characteristics  determined by [facility]. It has not been cleared or approved  by the Food and Drug Administration.  Performed At: [facility]  [place],  [place] [id number]  Rush Farmer MD WG:[id number]      Assessment: 50 year old female presenting with right eye pain for 2 weeks and right eye blurred vision for approximately 4 days with photophobia and increased pain with eye movement.  MRI with evidence of optic neuritis-not meeting McDonald criteria yet to be diagnosed as MS.  Lab results without evidence of infection.  MoG antibodies positive- MoG optic neuritis.   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 -Continue SSI and Protonix during steroid treatment -Outpatientf/u with Dr. Felecia Shelling with [facility] in 2 to 4 weeks after discharge. -At that time, I am hoping the remaining labs will be resulted and a discussion about disease modifying therapy could be made.  -- Anibal Henderson, AGACNP-BC [facility] [phone]  Attending Neurohospitalist Addendum Patient seen and examined with APP/Resident. Agree with the history and physical as documented above. Agree with the plan as documented, which I helped formulate. I have independently reviewed the chart, obtained history, review of systems and examined the patient.I have personally reviewed pertinent head/neck/spine imaging (CT/MRI).  MoG +ve Good response to steroid treatment.  Do not see the need for IVIG or plasma exchange. Will have close follow-up arranged with [facility] clinic at [facility] neurolog[facility] with Dr. Felecia Shelling  Please feel free to call with any questions.  -- Amie Portland, MD Neurologist [facility] Pager: [phone]

## 2020-04-28 NOTE — Progress Notes (Signed)
PROGRESS NOTE    Valerie [name]  KGM:[id number] DOB: [birth year] DOA: 04/24/2020 PCP: Wellman, [place]    Brief Narrative:  Mrs. Hackley was admitted to the hospital with a working diagnosis of acute right optic neuritis.  50 year old female with past medical history for hypothyroidism, who presents with 7 to 10 days of dry cough, nasal congestion and right eye pain. Visual changes more predominantly for the last 2 days with light sensitivity, decreased vision. As an outpatient she was evaluated by ophthalmologist, she was referred emergently to the hospital, for further work-up for multiple sclerosis, intravenous steroids and brain MRI. On her initial physical examination blood pressure 118/82, heart rate 93, respiratory rate 17, temperature 98.3, oxygen saturation 95%, she had clear lungs auscultation, heart S1-S2 were present rhythmic, soft abdomen, no lower extremity edema. Her right pupil was dilated and poorly responsive to light.  Orbit MRI with abnormal contrast enhancement of the intraorbital right optic nerve consistent with optic neuritis. Normal brain and spine(cervical and thoracic)MRI.  Neurology was consultedwith recommendations for pulse dose steroids 1000 mg daily for 5 days. She preferred to wait on lumbar puncture until serum test resulted (aquaporin 4 and Mog antibodies).   Assessment & Plan:   Principal Problem:   Optic neuritis, right Active Problems:   Hypothyroid   1. Acute right optic neuritis.her right eye symptoms are improving.  ] Tolerating weill high dose steroids 1000 mg methylprednisolone IV daily#4/5  Continue Neurologic monitoring per unit protocol.  Patient does not have criteria for multiple sclerosis per Neurology evaluation.   Continue with pantoprazole.   Her anti-MOG antibody was positive 1:320. Follow up with neurology recommendations.   2. Hypothyroid.On levothyroxine.   3. Steroid induced  hyperglycemia. Glucose continue to be stable. Patient is tolerating po well.  Capillary glucose 183-160-94-120   Status is: Inpatient  Remains inpatient appropriate because:IV treatments appropriate due to intensity of illness or inability to take PO   Dispo: The patient is from: Home              Anticipated d/c is to: Home              Anticipated d/c date is: 1 day              Patient currently is not medically stable to d/c. Plan for dc home in am after her last dose of steroids.    DVT prophylaxis: Patient out of bed, she had declined enoxaparin   Code Status:   full  Family Communication:  No family at the bedside       Consultants:   Neurology       Subjective: Patient reports improvement in eye vision on the right, but not yet back to baseline, pain is controlled.   Objective: Vitals:   04/27/20 0638 04/27/20 1531 04/27/20 2056 04/28/20 0532  BP: 115/74 107/76 126/86 104/78  Pulse: 70 93 71 80  Resp: 20 18 20 16   Temp: 98.3 F (36.8 C) 98.8 F (37.1 C) 98.8 F (37.1 C) 98.9 F (37.2 C)  TempSrc:  Oral Oral Oral  SpO2: 96% 95% 93% 97%  Weight:      Height:        Intake/Output Summary (Last 24 hours) at 04/28/2020 1352 Last data filed at 04/27/2020 1532 Gross per 24 hour  Intake 237 ml  Output -  Net 237 ml   Filed Weights   04/24/20 1744  Weight: 76.2 kg    Examination:  General: Not in pain or dyspnea,  Neurology: Awake and alert, non focal  E ENT: no pallor, no icterus, oral mucosa moist Cardiovascular: No JVD. S1-S2 present, rhythmic,No lower extremity edema. Pulmonary: positive breath sounds bilaterally,  Gastrointestinal. Abdomen soft  Skin. No rashes Musculoskeletal: no joint deformities     Data Reviewed: I have personally reviewed following labs and imaging studies  CBC: Recent Labs  Lab 04/25/20 0009  WBC 7.3  HGB 14.0  HCT 41.2  MCV 94.9  PLT 599   Basic Metabolic Panel: Recent Labs  Lab 04/25/20 0009  NA  137  K 3.7  CL 102  CO2 27  GLUCOSE 110*  BUN 14  CREATININE 0.92  CALCIUM 8.5*   GFR: Estimated Creatinine Clearance: 76.3 mL/min (by C-G formula based on SCr of 0.92 mg/dL). Liver Function Tests: Recent Labs  Lab 04/25/20 0009  AST 20  ALT 22  ALKPHOS 36*  BILITOT 0.5  PROT 6.6  ALBUMIN 3.4*   No results for input(s): LIPASE, AMYLASE in the last 168 hours. No results for input(s): AMMONIA in the last 168 hours. Coagulation Profile: No results for input(s): INR, PROTIME in the last 168 hours. Cardiac Enzymes: No results for input(s): CKTOTAL, CKMB, CKMBINDEX, TROPONINI in the last 168 hours. BNP (last 3 results) No results for input(s): PROBNP in the last 8760 hours. HbA1C: No results for input(s): HGBA1C in the last 72 hours. CBG: Recent Labs  Lab 04/27/20 1218 04/27/20 1652 04/27/20 2030 04/28/20 0743 04/28/20 1139  GLUCAP 123* 183* 160* 94 120*   Lipid Profile: No results for input(s): CHOL, HDL, LDLCALC, TRIG, CHOLHDL, LDLDIRECT in the last 72 hours. Thyroid Function Tests: No results for input(s): TSH, T4TOTAL, FREET4, T3FREE, THYROIDAB in the last 72 hours. Anemia Panel: No results for input(s): VITAMINB12, FOLATE, FERRITIN, TIBC, IRON, RETICCTPCT in the last 72 hours.    Radiology Studies: I have reviewed all of the imaging during this hospital visit personally     Scheduled Meds: . vitamin C  250 mg Oral Daily  . cholecalciferol  1,000 Units Oral Daily  . insulin aspart  0-15 Units Subcutaneous TID WC  . levothyroxine  75 mcg Oral QAC breakfast  . pantoprazole  40 mg Oral BID  . saccharomyces boulardii  250 mg Oral BID  . zinc sulfate  220 mg Oral Daily   Continuous Infusions: . methylPREDNISolone (SOLU-MEDROL) injection 1,000 mg (04/28/20 0851)     LOS: 4 days        Mauricio Gerome Apley, MD

## 2020-04-29 DIAGNOSIS — E039 Hypothyroidism, unspecified: Secondary | ICD-10-CM | POA: Diagnosis not present

## 2020-04-29 DIAGNOSIS — H469 Unspecified optic neuritis: Secondary | ICD-10-CM | POA: Diagnosis not present

## 2020-04-29 LAB — GLUCOSE, CAPILLARY: Glucose-Capillary: 89 mg/dL (ref 70–99)

## 2020-04-29 NOTE — Discharge Summary (Signed)
Physician Discharge Summary  Valerie [name] [id number] DOB: [birth year] DOA: 04/24/2020  PCP: [place], [place] date: 04/24/2020 Discharge date: 04/29/2020  Admitted From: Home  Disposition:   Home   Recommendations for Outpatient Follow-up and new medication changes:  1. Follow up with [place] at [facility] in 3 weeks.  2. Follow up with Dr Felecia Shelling at [facility] [place] [facility].    Home Health: no   Equipment/Devices: no    Discharge Condition: stable  CODE STATUS: full  Diet recommendation: regular.   Brief/Interim Summary: Valerie [name] was admitted to the hospital with a working diagnosis of acute right optic neuritis.  50 year old female with past medical history for hypothyroidism, who presents with 7 to 10 days of dry cough, nasal congestion and right eye pain. Visual changes more predominantly for the last 2 days with light sensitivity, decreased vision. As an outpatient she was evaluated by ophthalmologist, she was referred emergently to the hospital, for further work-up for multiple sclerosis, and possible intravenous steroids and brain MRI. On her initial physical examination blood pressure 118/82, heart rate 93, respiratory rate 17, temperature 98.3, oxygen saturation 95%, she had clear lungs to auscultation, heart S1-S2 were present rhythmic, soft abdomen, no lower extremity edema. Her right pupil was dilated and poorly responsive to light. Decreased right vision.   Sodium 137, potassium 3.7, chloride 102, bicarb 27, glucose 110, BUN 14, creatinine 0.92, white count 7.3, hemoglobin 14.0, hematocrit 41.2, platelets 257.  Orbit MRI with abnormal contrast enhancement of the intraorbital right optic nerve consistent with optic neuritis. Normal brain and spine(cervical and thoracic)MRI.  Neurology was consultedwith recommendations for pulse dose steroids 1000 mg daily for 5 days. She preferred to wait on lumbar puncture until  serum test resulted (aquaporin 4 and Mog antibodies).  She tolerated well systemic corticosteroids, with improvement in her right eye pain and vision.   1. Acute right optic neuritis.  Patient was admitted to the medical ward, she had frequent neurochecks and received 1000 mg of methylprednisolone intravenously daily for 5 days. Her symptoms including right eye pain and visual acuity improved.  Further work-up resulted in MOG antibody positive 1:320, antinuclear antibody negative, NMO IgG <1.5, SSA and SSB <0.2.   Patient will follow-up as an outpatient with Dr. Felecia Shelling at [facility] neurology, multiple sclerosis clinic.  2. Hypothyroid.  Continue levothyroxine.  3. Steroid-induced hyperglycemia.  Patient received insulin therapy, sliding scale for glucose coverage and monitoring. At her discharge her capillary glucose peak at 151.  Systemic corticosteroids will be discontinued at discharge.  Discharge Diagnoses:  Principal Problem:   Optic neuritis, right Active Problems:   Hypothyroid    Discharge Instructions   Allergies as of 04/29/2020   No Known Allergies     Medication List    TAKE these medications   cholecalciferol 25 MCG (1000 UNIT) tablet Commonly known as: VITAMIN D3 Take 1,000 Units by mouth daily.   CULTRELLE KIDS IMMUNE DEFENSE PO Take 1 tablet by mouth daily.   levothyroxine 75 MCG tablet Commonly known as: SYNTHROID Take 75 mcg by mouth daily.   TURMERIC PO Take 1 tablet by mouth daily.   vitamin C 250 MG tablet Commonly known as: ASCORBIC ACID Take 250 mg by mouth daily.   ZINC PO Take 1 tablet by mouth daily.       [place], [place] Follow up in 3 day(s).   Contact information: [address] [place] [address] [phone]              No Known Allergies  Consultations:  Neurology    Procedures/Studies: DG Chest 2 View  Result Date: 04/24/2020 CLINICAL DATA:  Chest pain  multiple sclerosis EXAM: CHEST - 2 VIEW COMPARISON:  None. FINDINGS: The heart size and mediastinal contours are within normal limits. Both lungs are clear. The visualized skeletal structures are unremarkable. IMPRESSION: No active cardiopulmonary disease. Electronically Signed   By: Donavan Foil M.D.   On: 04/24/2020 21:44   MR BRAIN W WO CONTRAST  Result Date: 04/24/2020 CLINICAL DATA:  Right eye pain and vision loss. Family history of multiple sclerosis. EXAM: MRI HEAD AND ORBITS WITHOUT AND WITH CONTRAST TECHNIQUE: Multiplanar, multiecho pulse sequences of the brain and surrounding structures were obtained without and with intravenous contrast. Multiplanar, multiecho pulse sequences of the orbits and surrounding structures were obtained including fat saturation techniques, before and after intravenous contrast administration. CONTRAST:  7.46mL GADAVIST GADOBUTROL 1 MMOL/ML IV SOLN COMPARISON:  None. FINDINGS: MRI HEAD FINDINGS Brain: No acute infarct, mass effect or extra-axial collection. No acute or chronic hemorrhage. Normal white matter signal, parenchymal volume and CSF spaces. The midline structures are normal. Prominent perivascular space of the left lentiform nucleus. Vascular: Major flow voids are preserved. Skull and upper cervical spine: Normal calvarium and skull base. Visualized upper cervical spine and soft tissues are normal. MRI ORBITS FINDINGS Orbits: There is abnormal contrast enhancement of the right optic nerve. No other orbital abnormality. The abnormal enhancement involves only the intraorbital segment of the nerve. Visualized sinuses: Minimal left ethmoid mucosal thickening. Soft tissues: Negative IMPRESSION: 1. Abnormal contrast enhancement of the intraorbital right optic nerve, consistent with optic neuritis. 2. Normal MRI of the brain.  No evidence of demyelinating disease. Electronically Signed   By: Ulyses Jarred M.D.   On: 04/24/2020 21:45   MR CERVICAL SPINE W WO  CONTRAST  Result Date: 04/24/2020 CLINICAL DATA:  Right eye pain.  Concern for demyelinating disease. EXAM: MRI CERVICAL AND THORACIC SPINE WITHOUT AND WITH CONTRAST TECHNIQUE: Multiplanar and multiecho pulse sequences of the cervical spine, to include the craniocervical junction and cervicothoracic junction, and the thoracic spine, were obtained without and with intravenous contrast. CONTRAST:  7.10mL GADAVIST GADOBUTROL 1 MMOL/ML IV SOLN COMPARISON:  None. FINDINGS: MRI CERVICAL SPINE FINDINGS Alignment: Physiologic. Vertebrae: No fracture, evidence of discitis, or bone lesion. Cord: Normal signal and morphology. No abnormal contrast enhancement. Posterior Fossa, vertebral arteries, paraspinal tissues: Negative. Disc levels: C1-2: Unremarkable. C2-3: Normal disc space and facet joints. There is no spinal canal stenosis. No neural foraminal stenosis. C3-4: Normal disc space and facet joints. There is no spinal canal stenosis. No neural foraminal stenosis. C4-5: Small disc bulge. There is no spinal canal stenosis. No neural foraminal stenosis. C5-6: Right asymmetric small disc bulge with right facet hypertrophy. There is no spinal canal stenosis. Moderate right neural foraminal stenosis. C6-7: Small central disc protrusion. There is no spinal canal stenosis. No neural foraminal stenosis. C7-T1: Normal disc space and facet joints. There is no spinal canal stenosis. No neural foraminal stenosis. MRI THORACIC SPINE FINDINGS Alignment:  Physiologic. Vertebrae: No fracture, evidence of discitis, or bone lesion. Cord: Normal signal and morphology. No abnormal contrast enhancement. Paraspinal and other soft tissues: Negative. Disc levels: No spinal canal stenosis. IMPRESSION: 1. No evidence of demyelinating disease of the cervical or thoracic spine. 2. Cervical degenerative disc disease without spinal canal stenosis. 3. Moderate right  C5-6 neural foraminal stenosis. Electronically Signed   By: Ulyses Jarred M.D.   On:  04/24/2020 21:50   MR THORACIC SPINE W WO CONTRAST  Result Date: 04/24/2020 CLINICAL DATA:  Right eye pain.  Concern for demyelinating disease. EXAM: MRI CERVICAL AND THORACIC SPINE WITHOUT AND WITH CONTRAST TECHNIQUE: Multiplanar and multiecho pulse sequences of the cervical spine, to include the craniocervical junction and cervicothoracic junction, and the thoracic spine, were obtained without and with intravenous contrast. CONTRAST:  7.58mL GADAVIST GADOBUTROL 1 MMOL/ML IV SOLN COMPARISON:  None. FINDINGS: MRI CERVICAL SPINE FINDINGS Alignment: Physiologic. Vertebrae: No fracture, evidence of discitis, or bone lesion. Cord: Normal signal and morphology. No abnormal contrast enhancement. Posterior Fossa, vertebral arteries, paraspinal tissues: Negative. Disc levels: C1-2: Unremarkable. C2-3: Normal disc space and facet joints. There is no spinal canal stenosis. No neural foraminal stenosis. C3-4: Normal disc space and facet joints. There is no spinal canal stenosis. No neural foraminal stenosis. C4-5: Small disc bulge. There is no spinal canal stenosis. No neural foraminal stenosis. C5-6: Right asymmetric small disc bulge with right facet hypertrophy. There is no spinal canal stenosis. Moderate right neural foraminal stenosis. C6-7: Small central disc protrusion. There is no spinal canal stenosis. No neural foraminal stenosis. C7-T1: Normal disc space and facet joints. There is no spinal canal stenosis. No neural foraminal stenosis. MRI THORACIC SPINE FINDINGS Alignment:  Physiologic. Vertebrae: No fracture, evidence of discitis, or bone lesion. Cord: Normal signal and morphology. No abnormal contrast enhancement. Paraspinal and other soft tissues: Negative. Disc levels: No spinal canal stenosis. IMPRESSION: 1. No evidence of demyelinating disease of the cervical or thoracic spine. 2. Cervical degenerative disc disease without spinal canal stenosis. 3. Moderate right C5-6 neural foraminal stenosis.  Electronically Signed   By: Ulyses Jarred M.D.   On: 04/24/2020 21:50   MR ORBITS W WO CONTRAST  Result Date: 04/24/2020 CLINICAL DATA:  Right eye pain and vision loss. Family history of multiple sclerosis. EXAM: MRI HEAD AND ORBITS WITHOUT AND WITH CONTRAST TECHNIQUE: Multiplanar, multiecho pulse sequences of the brain and surrounding structures were obtained without and with intravenous contrast. Multiplanar, multiecho pulse sequences of the orbits and surrounding structures were obtained including fat saturation techniques, before and after intravenous contrast administration. CONTRAST:  7.58mL GADAVIST GADOBUTROL 1 MMOL/ML IV SOLN COMPARISON:  None. FINDINGS: MRI HEAD FINDINGS Brain: No acute infarct, mass effect or extra-axial collection. No acute or chronic hemorrhage. Normal white matter signal, parenchymal volume and CSF spaces. The midline structures are normal. Prominent perivascular space of the left lentiform nucleus. Vascular: Major flow voids are preserved. Skull and upper cervical spine: Normal calvarium and skull base. Visualized upper cervical spine and soft tissues are normal. MRI ORBITS FINDINGS Orbits: There is abnormal contrast enhancement of the right optic nerve. No other orbital abnormality. The abnormal enhancement involves only the intraorbital segment of the nerve. Visualized sinuses: Minimal left ethmoid mucosal thickening. Soft tissues: Negative IMPRESSION: 1. Abnormal contrast enhancement of the intraorbital right optic nerve, consistent with optic neuritis. 2. Normal MRI of the brain.  No evidence of demyelinating disease. Electronically Signed   By: Ulyses Jarred M.D.   On: 04/24/2020 21:45       Subjective: Patient is feeling better, right pain has improved along with vision, no nausea or vomiting, no headache, no dyspnea or chest pain.   Discharge Exam: Vitals:   04/28/20 1420 04/28/20 2058  BP: 109/77 127/83  Pulse: 89 74  Resp: 18   Temp: 98.8 F (37.1 C)  97.7  F (36.5 C)  SpO2: 94% 96%   Vitals:   04/27/20 2056 04/28/20 0532 04/28/20 1420 04/28/20 2058  BP: 126/86 104/78 109/77 127/83  Pulse: 71 80 89 74  Resp: 20 16 18    Temp: 98.8 F (37.1 C) 98.9 F (37.2 C) 98.8 F (37.1 C) 97.7 F (36.5 C)  TempSrc: Oral Oral Oral Axillary  SpO2: 93% 97% 94% 96%  Weight:      Height:        General: Not in pain or dyspnea.  Neurology: Awake and alert, non focal  E ENT: no pallor, no icterus, oral mucosa moist Cardiovascular: No JVD. S1-S2 present, rhythmic, no gallops, rubs, or murmurs. No lower extremity edema. Pulmonary: vesicular breath sounds bilaterally,  Gastrointestinal. Abdomen soft and non tender Skin. No rashes Musculoskeletal: no joint deformities   The results of significant diagnostics from this hospitalization (including imaging, microbiology, ancillary and laboratory) are listed below for reference.     Microbiology: Recent Results (from the past 240 hour(s))  SARS CORONAVIRUS 2 (TAT 6-24 HRS) Nasopharyngeal Nasopharyngeal Swab     Status: None   Collection Time: 04/24/20  6:42 PM   Specimen: Nasopharyngeal Swab  Result Value Ref Range Status   SARS Coronavirus 2 NEGATIVE NEGATIVE Final    Comment: (NOTE) SARS-CoV-2 target nucleic acids are NOT DETECTED.  The SARS-CoV-2 RNA is generally detectable in upper and lower respiratory specimens during the acute phase of infection. Negative results do not preclude SARS-CoV-2 infection, do not rule out co-infections with other pathogens, and should not be used as the sole basis for treatment or other patient management decisions. Negative results must be combined with clinical observations, patient history, and epidemiological information. The expected result is Negative.  Fact Sheet for Patients: SugarRoll.be  Fact Sheet for Healthcare Providers: https://www.woods-mathews.com/  This test is not yet approved or cleared by the  [place] FDA and  has been authorized for detection and/or diagnosis of SARS-CoV-2 by FDA under an Emergency Use Authorization (EUA). This EUA will remain  in effect (meaning this test can be used) for the duration of the COVID-19 declaration under Se ction 564(b)(1) of the Act, 21 U.S.C. section 360bbb-3(b)(1), unless the authorization is terminated or revoked sooner.  Performed at [facility], [place] [address] [place], [place] [address]      Labs: BNP (last 3 results) No results for input(s): BNP in the last 8760 hours. Basic Metabolic Panel: Recent Labs  Lab 04/25/20 0009  NA 137  K 3.7  CL 102  CO2 27  GLUCOSE 110*  BUN 14  CREATININE 0.92  CALCIUM 8.5*   Liver Function Tests: Recent Labs  Lab 04/25/20 0009  AST 20  ALT 22  ALKPHOS 36*  BILITOT 0.5  PROT 6.6  ALBUMIN 3.4*   No results for input(s): LIPASE, AMYLASE in the last 168 hours. No results for input(s): AMMONIA in the last 168 hours. CBC: Recent Labs  Lab 04/25/20 0009  WBC 7.3  HGB 14.0  HCT 41.2  MCV 94.9  PLT 257   Cardiac Enzymes: No results for input(s): CKTOTAL, CKMB, CKMBINDEX, TROPONINI in the last 168 hours. BNP: Invalid input(s): POCBNP CBG: Recent Labs  Lab 04/27/20 2030 04/28/20 0743 04/28/20 1139 04/28/20 1639 04/28/20 2059  GLUCAP 160* 94 120* 133* 151*   D-Dimer No results for input(s): DDIMER in the last 72 hours. Hgb A1c No results for input(s): HGBA1C in the last 72 hours. Lipid Profile No results for input(s): CHOL,  HDL, LDLCALC, TRIG, CHOLHDL, LDLDIRECT in the last 72 hours. Thyroid function studies No results for input(s): TSH, T4TOTAL, T3FREE, THYROIDAB in the last 72 hours.  Invalid input(s): FREET3 Anemia work up No results for input(s): VITAMINB12, FOLATE, FERRITIN, TIBC, IRON, RETICCTPCT in the last 72 hours. Urinalysis    Component Value Date/Time   COLORURINE YELLOW 04/25/2020 0002   APPEARANCEUR HAZY (A) 04/25/2020 0002   LABSPEC  1.029 04/25/2020 0002   PHURINE 5.0 04/25/2020 0002   GLUCOSEU NEGATIVE 04/25/2020 0002   HGBUR NEGATIVE 04/25/2020 0002   BILIRUBINUR NEGATIVE 04/25/2020 0002   KETONESUR 20 (A) 04/25/2020 0002   PROTEINUR NEGATIVE 04/25/2020 0002   NITRITE NEGATIVE 04/25/2020 0002   LEUKOCYTESUR NEGATIVE 04/25/2020 0002   Sepsis Labs Invalid input(s): PROCALCITONIN,  WBC,  LACTICIDVEN Microbiology Recent Results (from the past 240 hour(s))  SARS CORONAVIRUS 2 (TAT 6-24 HRS) Nasopharyngeal Nasopharyngeal Swab     Status: None   Collection Time: 04/24/20  6:42 PM   Specimen: Nasopharyngeal Swab  Result Value Ref Range Status   SARS Coronavirus 2 NEGATIVE NEGATIVE Final    Comment: (NOTE) SARS-CoV-2 target nucleic acids are NOT DETECTED.  The SARS-CoV-2 RNA is generally detectable in upper and lower respiratory specimens during the acute phase of infection. Negative results do not preclude SARS-CoV-2 infection, do not rule out co-infections with other pathogens, and should not be used as the sole basis for treatment or other patient management decisions. Negative results must be combined with clinical observations, patient history, and epidemiological information. The expected result is Negative.  Fact Sheet for Patients: SugarRoll.be  Fact Sheet for Healthcare Providers: https://www.woods-mathews.com/  This test is not yet approved or cleared by the [place] FDA and  has been authorized for detection and/or diagnosis of SARS-CoV-2 by FDA under an Emergency Use Authorization (EUA). This EUA will remain  in effect (meaning this test can be used) for the duration of the COVID-19 declaration under Se ction 564(b)(1) of the Act, 21 U.S.C. section 360bbb-3(b)(1), unless the authorization is terminated or revoked sooner.  Performed at [facility], [place] [address], [place], [place] [address]      Time coordinating discharge: 45  minutes  SIGNED:   Tawni Millers, MD  [facility] 04/29/2020, 6:13 AM

## 2020-04-29 NOTE — Progress Notes (Signed)
Neurology progress note  Subjective: Patient receiving dose 5/5 of steroids this morning on examination. Patient with overall improvement in right eye pain and vision since admission; no improvement or decline from examination yesterday. Patient verbalizes readiness to go home and understands outpatient follow up instructions with Dr. Felecia Shelling at [facility]. States her right eye vision remains blurry but her eye pain is now only minimal with lateral gaze.   Exam: Vitals:   04/28/20 2058 04/29/20 0616  BP: 127/83 128/88  Pulse: 74 62  Resp:  17  Temp: 97.7 F (36.5 C) 98.3 F (36.8 C)  SpO2: 96% 95%   Gen: awake, sitting up in bed, speaking on the phone with her mother, no acute distress Resp: non-labored breathing, no acute distress Abd: soft, non-distended, non-tender  Neuro: MS: awake, alert and oriented to time, place, person, and situation. Naming, cognition, comprehension intact. Speech is clear without aphasia.  CN: Visual acuity in the right eye with significant impairment but with subjective improvement since initial admition;much more clear today with her vision and is able to read a chart placed at least 15 feet away. Pupils are 29mm and briskly reactive bilaterally with resolution of right eye afferent pupillary defect.No EOM restriction. Face symmetric resting and smiling with sensation intact bilaterally. Hearing intact to voice. Tongue protrudes midline. 20/20 OD, 20/20OS acuity  Motor:Moves all extremities spontaneously. Antigravity movement intact without pronator drift. Bulk and tone are normal. Sensory:Sensation present and equal upper and lower extremities.  Cerebellar: FNF and HKS intact bilaterally Gait: deferred  Pertinent Labs: CBC    Component Value Date/Time   WBC 7.3 04/25/2020 0009   RBC 4.34 04/25/2020 0009   HGB 14.0 04/25/2020 0009   HCT 41.2 04/25/2020 0009   PLT 257 04/25/2020 0009   MCV 94.9 04/25/2020 0009   MCH 32.3 04/25/2020 0009   MCHC 34.0  04/25/2020 0009   RDW 12.1 04/25/2020 0009   ANA negative SSA SSB negative NMO antibodies pending MoGantibodies positive-see below  Misc [facility] result Miscellaneous [facility] test (send-out) Collected: 04/25/20 0009  Result status: Final  Resulting lab: [facility]  Value: COMMENT  Comment: (NOTE)  Test Ordered: [id number] Anti-MOG, Serum  MOG Antibody, Cell-based IFA  Positive  Linda.Low ]     BN    Reference Range: Negative                This test was developed and its performance characteristics  determined by [facility]. It has not been cleared or approved  by the Food and Drug Administration.  Anti-MOG Antibody Titer, Serum 1:320           BN    Reference Range: Neg: <1:10               This test was developed and its performance characteristics  determined by [facility]. It has not been cleared or approved  by the Food and Drug Administration.  Performed At: [facility]  [place], [place] [id number]  Rush Farmer MD WU:[id number]     Assessment: 50 year old female presenting with right eye pain for 2 weeks and right eye blurred vision for approximately 4 days with photophobia and increased pain with eye movement.  MRI with evidence of optic neuritis-not meeting McDonald criteria yet to be diagnosed asMS.  Lab results without evidence of infection.  MoG antibodies positive- MoG optic neuritis.   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 No further tests or interventions planned from the neurology team in the inpatient setting at this time.  - Patient verbalized understanding for returning to hospital  including acute vision changes, increasing or new eye pain, numbness or tingling of the extremities, weakness of extremities, altered mental status.  -Outpatientf/u with Dr. Ernesto Rutherford [facility] in 2 to 4 weeks after discharge. Dr. Felecia Shelling contacted with patient information for continuity of care and transition to outpatient care.  -At that time, I am hoping the remaining labs will be resulted and a discussion about disease modifying therapy could be made. -Counseled on importance of vaccinations in conjunction with discussion with neurologist outpatient. Hold off on any vaccine for now until you see Dr Felecia Shelling but please do discuss when appropriate to get vaccines (not just COVID, but seasonal influenza as well as any other age appropriate vaccines).  Anibal Henderson, AGACNP-BC [facility] [phone]   Attending Neurohospitalist Addendum Patient seen and examined with APP/Resident. Agree with the history and physical as documented above. Agree with the plan as documented, which I helped formulate. I have independently reviewed the chart, obtained history, review of systems and examined the patient.I have personally reviewed pertinent head/neck/spine imaging (CT/MRI). Stressed importance of f/u with Dr Felecia Shelling. Have reached out to him already. Instructed to call [facility] if not contacted by mid week. Discussed vaccination importance but asked to discuss with Dr. Felecia Shelling prior to getting vaccinated due to acute demyelinating event and possibly starting immunomodulators and/or suppressants. Vaccine related discussion was continued as patient's mother, who lives in [place] was concerned and asked direct questions. Patient is very well informed and had good questions that I and the team answered to the best of our abilities. She will continue the discussion with outpatient PCP and neurologist. Please feel free to call with any questions. --- Amie Portland, MD [facility] Pager: [phone]  If  7pm to 7am, please call on call as listed on AMION.

## 2020-05-06 ENCOUNTER — Emergency Department (HOSPITAL_COMMUNITY)
Admission: EM | Admit: 2020-05-06 | Discharge: 2020-05-06 | Disposition: A | Discharge status: Home / Self Care | Payer: Commercial Managed Care - PPO | Attending: Emergency Medicine | Admitting: Emergency Medicine

## 2020-05-06 ENCOUNTER — Other Ambulatory Visit: Payer: Self-pay

## 2020-05-06 DIAGNOSIS — Z5321 Procedure and treatment not carried out due to patient leaving prior to being seen by health care provider: Secondary | ICD-10-CM | POA: Diagnosis not present

## 2020-05-06 DIAGNOSIS — H538 Other visual disturbances: Secondary | ICD-10-CM | POA: Diagnosis not present

## 2020-05-06 DIAGNOSIS — H5712 Ocular pain, left eye: Secondary | ICD-10-CM | POA: Insufficient documentation

## 2020-05-06 LAB — COMPREHENSIVE METABOLIC PANEL
ALT: 27 U/L (ref 0–44)
AST: 15 U/L (ref 15–41)
Albumin: 3.4 g/dL — ABNORMAL LOW (ref 3.5–5.0)
Alkaline Phosphatase: 41 U/L (ref 38–126)
Anion gap: 9 (ref 5–15)
BUN: 13 mg/dL (ref 6–20)
CO2: 24 mmol/L (ref 22–32)
Calcium: 8.6 mg/dL — ABNORMAL LOW (ref 8.9–10.3)
Chloride: 105 mmol/L (ref 98–111)
Creatinine, Ser: 1.05 mg/dL — ABNORMAL HIGH (ref 0.44–1.00)
GFR, Estimated: >60 mL/min (ref >60)
Glucose, Bld: 97 mg/dL (ref 70–99)
Potassium: 4.5 mmol/L (ref 3.5–5.1)
Sodium: 138 mmol/L (ref 135–145)
Total Bilirubin: 0.4 mg/dL (ref 0.3–1.2)
Total Protein: 6.7 g/dL (ref 6.5–8.1)

## 2020-05-06 LAB — CBC WITH DIFFERENTIAL/PLATELET
Abs Immature Granulocytes: 0.03 10*3/uL (ref 0.00–0.07)
Basophils Absolute: 0 10*3/uL (ref 0.0–0.1)
Basophils Relative: 0 %
Eosinophils Absolute: 0.2 10*3/uL (ref 0.0–0.5)
Eosinophils Relative: 3 %
HCT: 47.7 % — ABNORMAL HIGH (ref 36.0–46.0)
Hemoglobin: 15.4 g/dL — ABNORMAL HIGH (ref 12.0–15.0)
Immature Granulocytes: 0 %
Lymphocytes Relative: 18 %
Lymphs Abs: 1.6 10*3/uL (ref 0.7–4.0)
MCH: 31.1 pg (ref 26.0–34.0)
MCHC: 32.3 g/dL (ref 30.0–36.0)
MCV: 96.4 fL (ref 80.0–100.0)
Monocytes Absolute: 1 10*3/uL (ref 0.1–1.0)
Monocytes Relative: 12 %
Neutro Abs: 5.7 10*3/uL (ref 1.7–7.7)
Neutrophils Relative %: 67 %
Platelets: 248 10*3/uL (ref 150–400)
RBC: 4.95 MIL/uL (ref 3.87–5.11)
RDW: 12.6 % (ref 11.5–15.5)
WBC: 8.5 10*3/uL (ref 4.0–10.5)
nRBC: 0 % (ref 0.0–0.2)

## 2020-05-06 NOTE — ED Triage Notes (Signed)
Patient stated she was recently admitted and discharge last Sunday for optic neuritis on right side and concern that it is now on left eye d/t hazy vision and pain

## 2020-05-06 NOTE — ED Notes (Addendum)
Pt notified staff that she is leaving due to wait time and will follow up with her doctor

## 2020-05-07 ENCOUNTER — Encounter: Payer: Self-pay | Admitting: Neurology

## 2020-05-07 ENCOUNTER — Ambulatory Visit (INDEPENDENT_AMBULATORY_CARE_PROVIDER_SITE_OTHER): Payer: Commercial Managed Care - PPO | Admitting: Neurology

## 2020-05-07 ENCOUNTER — Telehealth: Payer: Self-pay | Admitting: *Deleted

## 2020-05-07 ENCOUNTER — Telehealth: Payer: Self-pay | Admitting: Neurology

## 2020-05-07 VITALS — BP 121/69 | HR 89 | Ht 66.0 in | Wt 172.5 lb

## 2020-05-07 DIAGNOSIS — Z79899 Other long term (current) drug therapy: Secondary | ICD-10-CM | POA: Insufficient documentation

## 2020-05-07 DIAGNOSIS — H469 Unspecified optic neuritis: Secondary | ICD-10-CM | POA: Diagnosis not present

## 2020-05-07 DIAGNOSIS — G360 Neuromyelitis optica [Devic]: Secondary | ICD-10-CM | POA: Diagnosis not present

## 2020-05-07 MED ORDER — METHYLPREDNISOLONE 4 MG PO TABS: ORAL_TABLET | ORAL | 0 refills | Status: DC

## 2020-05-07 NOTE — Telephone Encounter (Signed)
Valerie [name] spoke with pt around 1030am this morning. We fit pt in for sooner appt today at 2:30pm with Dr. Felecia Shelling

## 2020-05-07 NOTE — Progress Notes (Signed)
[facility]  PATIENT: Valerie [name] DOB: [birth year]  REFERRING DOCTOR OR PCP:  Dr. Redgie Grayer SOURCE: Patient, notes from recent hospitalization, laboratory results, MRI images personally reviewed.  _________________________________   HISTORICAL  CHIEF COMPLAINT:  Chief Complaint  Patient presents with  . New Patient (Initial Visit)    RM 40 with friend, [name]. Referral from Arrien, York Ram, MD for right optic neuritis. Sx worsened over the weekend, now having left eye issues. Went to ER, waited 6 hr, then left. Original appt with MD this Wed but Dr. Epimenio Foot worked her in sooner for today.    HISTORY OF PRESENT ILLNESS:  I had the pleasure of seeing your patient, Valerie [name], at [facility] for neurologic consultation regarding her optic neuritis and positive anti-MOG antibodies  She is a 50 year old woman who had right >> left eye pain at the end of December 2021.  On 04/20/2020 she began tp have reduced vision OD and photophobia.    She saw an ophthalmologist who felt she had dry eyes.   She saw Dr. Briscoe Burns the next day who diagnosed her with right optic neuritis.  She was referred to Dr. Allyne Gee who did OCT c/with optic neuritis.   She was admittted 04/24/2020 to 04/29/2020 and received 5 days IV Solu-Medrol.   Vision improved with the steroids.   Upon discharge, she still had reduced visual acuity but good color vision.   She had only mild eye aching and mild photophobia.    She continued to be stable for the next few days.   On 05/05/2020, 2 days ago, she had aching in the left eye.    She went to the ED 05/06/20 after speaking with Dr. Vickey Huger,  Unfortunately, after 6 hours, she left without being seen.     Besides the optic neuritis, she has had no other neurologic symptoms.    Gait, strength, sensation and coordination are normal.  She denies any difficulties with bladder function.  Cognition is fine.  She has Hashimoto's  thyroiditis.  She had Covid 11/2019   Data review: Imaging: MRI of the brain 04/24/2020 was normal.  MRI of the orbits 04/24/2020 showed abnormal signal with enhancement in the right optic nerve consistent with optic neuritis.  MRI of the cervical spine and MRI of the thoracic spine 04/24/2020 showed normal spinal cord and some degenerative changes with moderate right foraminal narrowing at C5-C6.  Laboratory data: Labs 04/25/2020:   Anti-MOG antibodies were high titer positive (1: 320).  HIV was negative.   ANA/Sjogrn's antibodies were negative.   RPR was negative,  Anti-NMO Ab was negative,   HgbA1c was normal 5.4.  Sugars ranged 110-183.      REVIEW OF SYSTEMS: Constitutional: No fevers, chills, sweats, or change in appetite Eyes: No visual changes, double vision, eye pain Ear, nose and throat: No hearing loss, ear pain, nasal congestion, sore throat Cardiovascular: No chest pain, palpitations Respiratory: No shortness of breath at rest or with exertion.   No wheezes GastrointestinaI: No nausea, vomiting, diarrhea, abdominal pain, fecal incontinence Genitourinary: No dysuria, urinary retention or frequency.  No nocturia. Musculoskeletal: No neck pain, back pain Integumentary: No rash, pruritus, skin lesions Neurological: as above Psychiatric: No depression at this time.  No anxiety Endocrine: No palpitations, diaphoresis, change in appetite, change in weigh or increased thirst Hematologic/Lymphatic: No anemia, purpura, petechiae. Allergic/Immunologic: No itchy/runny eyes, nasal congestion, recent allergic reactions, rashes  ALLERGIES: No Known Allergies  HOME MEDICATIONS:  Current Outpatient  Medications:  .  Ascorbic Acid (VITA-C PO), Take 1,000 mg by mouth daily., Disp: , Rfl:  .  cholecalciferol (VITAMIN D3) 25 MCG (1000 UNIT) tablet, Take 5,000 Units by mouth daily., Disp: , Rfl:  .  levothyroxine (SYNTHROID) 75 MCG tablet, Take 75 mcg by mouth daily., Disp: , Rfl:  .   methylPREDNISolone (MEDROL) 4 MG tablet, Taper from 6 pills po for one day to 1 pill po the last day over 6 days, Disp: 21 tablet, Rfl: 0 .  Multiple Vitamins-Minerals (ZINC PO), Take 30 mg by mouth daily. Zinc, Disp: , Rfl:  .  Probiotic Product (CULTRELLE KIDS IMMUNE DEFENSE PO), Take 1 tablet by mouth daily., Disp: , Rfl:  .  TURMERIC PO, Take 1,500 mg by mouth daily., Disp: , Rfl:   PAST MEDICAL HISTORY: Past Medical History:  Diagnosis Date  . Hashimoto's disease   . Hypothyroidism     PAST SURGICAL HISTORY: Past Surgical History:  Procedure Laterality Date  . TUBAL LIGATION  02/2012    FAMILY HISTORY: Family History  Problem Relation Age of Onset  . GER disease Mother   . Other Mother        uterine fibroids, optic disc abnormality   . Eczema Mother   . Colon polyps Mother   . Hypothyroidism Mother   . Osteoarthritis Mother   . Hypertension Mother   . Migraines Mother   . Other Father        Colon adenoma  . Hyperlipidemia Father   . GER disease Father   . Heart murmur Father   . Amblyopia Father        Right eye  . Neuropathy Father   . Glaucoma Maternal Grandmother   . Macular degeneration Maternal Grandmother   . Multiple sclerosis Paternal Grandmother   . Breast cancer Neg Hx     SOCIAL HISTORY:  Social History   Socioeconomic History  . Marital status: Married    Spouse name: Not on file  . Number of children: 2  . Years of education: BS  . Highest education level: Not on file  Occupational History  . Occupation: Agricultural consultant  Tobacco Use  . Smoking status: Never Smoker  . Smokeless tobacco: Never Used  Vaping Use  . Vaping Use: Never used  Substance and Sexual Activity  . Alcohol use: Never  . Drug use: Never  . Sexual activity: Yes    Birth control/protection: None  Other Topics Concern  . Not on file  Social History Narrative   Right handed    Caffeine use: rare   Social Determinants of Health   Financial Resource Strain: Not  on file  Food Insecurity: Not on file  Transportation Needs: Not on file  Physical Activity: Not on file  Stress: Not on file  Social Connections: Not on file  Intimate Partner Violence: Not on file     PHYSICAL EXAM  Vitals:   05/07/20 1426  BP: 121/69  Pulse: 89  Weight: 172 lb 8 oz (78.2 kg)  Height: 5\' 6"  (1.676 m)    Body mass index is 27.84 kg/m.   Hearing Screening   125Hz  250Hz  500Hz  1000Hz  2000Hz  3000Hz  4000Hz  6000Hz  8000Hz   Right ear:           Left ear:             Visual Acuity Screening   Right eye Left eye Both eyes  Without correction:     With correction: 20/20 unable 20/20  General: The patient is well-developed and well-nourished and in no acute distress  HEENT:  Head is [place]/AT.  Sclera are anicteric.  Funduscopic exam shows normal optic discs and retinal vessels.  Neck: No carotid bruits are noted.  The neck is nontender.  Cardiovascular: The heart has a regular rate and rhythm with a normal S1 and S2. There were no murmurs, gallops or rubs.    Skin: Extremities are without rash or  edema.  Musculoskeletal:  Back is nontender  Neurologic Exam  Mental status: The patient is alert and oriented x 3 at the time of the examination. The patient has apparent normal recent and remote memory, with an apparently normal attention span and concentration ability.   Speech is normal.  Cranial nerves: Extraocular movements are full.  Moving the eyes laterally causes pain in the left eye.  She has a 2+ left APD.  Marland Kitchen Visual acuity was 20/20 OD and hand waving only OS.  No color vision OS.  Facial symmetry is present. There is good facial sensation to soft touch bilaterally.Facial strength is normal.  Trapezius and sternocleidomastoid strength is normal. No dysarthria is noted.  The tongue is midline, and the patient has symmetric elevation of the soft palate. No obvious hearing deficits are noted.  Motor:  Muscle bulk is normal.   Tone is normal. Strength is  5  / 5 in all 4 extremities.   Sensory: Sensory testing is intact to pinprick, soft touch and vibration sensation in all 4 extremities.  Coordination: Cerebellar testing reveals good finger-nose-finger and heel-to-shin bilaterally.  Gait and station: Station is normal.   Gait is normal. Tandem gait is mildly wide. Romberg is negative.   Reflexes: Deep tendon reflexes are symmetric and normal bilaterally.   Plantar responses are flexor.    DIAGNOSTIC DATA (LABS, IMAGING, TESTING) - I reviewed patient records, labs, notes, testing and imaging myself where available.  Lab Results  Component Value Date   WBC 8.5 05/06/2020   HGB 15.4 (H) 05/06/2020   HCT 47.7 (H) 05/06/2020   MCV 96.4 05/06/2020   PLT 248 05/06/2020      Component Value Date/Time   NA 138 05/06/2020 1429   K 4.5 05/06/2020 1429   CL 105 05/06/2020 1429   CO2 24 05/06/2020 1429   GLUCOSE 97 05/06/2020 1429   BUN 13 05/06/2020 1429   CREATININE 1.05 (H) 05/06/2020 1429   CALCIUM 8.6 (L) 05/06/2020 1429   PROT 6.7 05/06/2020 1429   ALBUMIN 3.4 (L) 05/06/2020 1429   AST 15 05/06/2020 1429   ALT 27 05/06/2020 1429   ALKPHOS 41 05/06/2020 1429   BILITOT 0.4 05/06/2020 1429   GFRNONAA >60 05/06/2020 1429   No results found for: CHOL, HDL, LDLCALC, LDLDIRECT, TRIG, CHOLHDL Lab Results  Component Value Date   HGBA1C 5.4 04/25/2020       ASSESSMENT AND PLAN  Neuromyelitis optica (devic) (HCC) - Plan: VITAMIN D 25 Hydroxy (Vit-D Deficiency, Fractures), Vitamin B12, QuantiFERON-TB Gold Plus, Hepatitis B surface antibody,qualitative, Hepatitis C antibody, Hepatitis B core antibody, total, Hepatitis B surface antigen, Varicella zoster antibody, IgG, CBC with Differential/Platelet, ANCA TITERS  High risk medication use - Plan: VITAMIN D 25 Hydroxy (Vit-D Deficiency, Fractures), Vitamin B12, QuantiFERON-TB Gold Plus, Hepatitis B surface antibody,qualitative, Hepatitis C antibody, Hepatitis B core antibody, total,  Hepatitis B surface antigen, Varicella zoster antibody, IgG, CBC with Differential/Platelet, ANCA TITERS  Optic neuritis, right - Plan: VITAMIN D 25 Hydroxy (Vit-D Deficiency, Fractures), Vitamin B12, QuantiFERON-TB Gold  Plus, Hepatitis B surface antibody,qualitative, Hepatitis C antibody, Hepatitis B core antibody, total, Hepatitis B surface antigen, Varicella zoster antibody, IgG, CBC with Differential/Platelet, ANCA TITERS   In summary, Ms. Quiggle is a 50 year old woman who had the onset of right optic neuritis 2 weeks ago that improved with IV steroids close to baseline.  2 days ago she had the onset of visual loss in the left eye consistent with left optic neuritis.  Of note, the anti-MOG antibody test was positive.  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  With 2 episodes of optic neuritis a few weeks apart, it is essential that we get her started on immunologic therapy.  I discussed Rituxan with her.  We would need to check some blood work to rule out chronic infections.  We will also check for vitamin D and B12 deficiency and ANCA.  I will also have her get 4 days of IV steroids this week.  I will send in a steroid pack since she has had some symptoms coming off of the steroids earlier this month.  I will see her back in 2 months for regular visit but she should call sooner if new or worsening symptoms.  Thank you for asking me to see Ms. Cole that.  Please let me know if I can be of further assistance with her or other patients in the future.  Rachard Isidro A. Felecia Shelling, MD, [facility] 8/76/8115, 7:26 PM Certified in Neurology, Clinical Neurophysiology, Sleep Medicine and Neuroimaging  [facility] [address], [place], [place] [address] [phone]

## 2020-05-07 NOTE — Telephone Encounter (Signed)
This patient was supposed to be a new referral to Dr Felecia Shelling on Wednesday, 1-26 -2022. She was admitted under Dr Rory Percy to [facility] and received 5 days of IV steroids for optic neuritis, discharged lat Sunday , 04-29-2020.  She called on 05-06-2020:  called with recurrent symptoms, similar, but in the other eye. Was asked to return to [facility] for iv treatment and may need to postpone her outpatient visit with Dr. Felecia Shelling. CD

## 2020-05-07 NOTE — Telephone Encounter (Signed)
Gave completed/signed orders to [facility] for 1G IV solumedrol x4days.

## 2020-05-08 ENCOUNTER — Telehealth: Payer: Self-pay | Admitting: *Deleted

## 2020-05-08 ENCOUNTER — Other Ambulatory Visit: Payer: Self-pay | Admitting: Neurology

## 2020-05-08 DIAGNOSIS — Z79899 Other long term (current) drug therapy: Secondary | ICD-10-CM

## 2020-05-08 DIAGNOSIS — G360 Neuromyelitis optica [Devic]: Secondary | ICD-10-CM

## 2020-05-08 NOTE — Telephone Encounter (Signed)
Gave completed/signed Truxima order to [facility] to start processing. (ok to do product of choice if needed per MD)  Pre-infusion: Acetaminophen (Tylenol) 650mg  po 20-61min prior to the infusion, Diphenhydramine (Benadryl) 25mg  po 15-37min prior to the infusion, methylprednisolone (solu-medrol) 125 IVP 30 min prior to the infusion.  Initial treatment: 1000mg  IV Day 1 and Day 15, mix in 220ml NS for final concentration of 4mg /ml Ongoing treatment: 1000mg  IV day 1 and day 15 every 24 weeks (no sooner than every 16 weeks) x92yr, mix in 293ml NS for final concentration of 61ml/ml  Dx: Neuromyelitis Optica ICD-10: G36.0

## 2020-05-09 ENCOUNTER — Ambulatory Visit: Payer: Commercial Managed Care - PPO | Admitting: Neurology

## 2020-05-09 ENCOUNTER — Telehealth: Payer: Self-pay | Admitting: *Deleted

## 2020-05-09 LAB — CBC WITH DIFFERENTIAL/PLATELET
Basophils Absolute: 0 10*3/uL (ref 0.0–0.2)
Basos: 0 %
EOS (ABSOLUTE): 0.2 10*3/uL (ref 0.0–0.4)
Eos: 3 %
Hematocrit: 44.6 % (ref 34.0–46.6)
Hemoglobin: 15 g/dL (ref 11.1–15.9)
Immature Grans (Abs): 0 10*3/uL (ref 0.0–0.1)
Immature Granulocytes: 1 %
Lymphocytes Absolute: 1.3 10*3/uL (ref 0.7–3.1)
Lymphs: 16 %
MCH: 31.4 pg (ref 26.6–33.0)
MCHC: 33.6 g/dL (ref 31.5–35.7)
MCV: 94 fL (ref 79–97)
Monocytes Absolute: 0.8 10*3/uL (ref 0.1–0.9)
Monocytes: 9 %
Neutrophils Absolute: 6.1 10*3/uL (ref 1.4–7.0)
Neutrophils: 71 %
Platelets: 220 10*3/uL (ref 150–450)
RBC: 4.77 x10E6/uL (ref 3.77–5.28)
RDW: 12.3 % (ref 11.7–15.4)
WBC: 8.5 10*3/uL (ref 3.4–10.8)

## 2020-05-09 LAB — QUANTIFERON-TB GOLD PLUS
QuantiFERON Mitogen Value: >10 IU/mL
QuantiFERON Nil Value: 0 IU/mL
QuantiFERON TB1 Ag Value: 0 IU/mL
QuantiFERON TB2 Ag Value: 0 IU/mL
QuantiFERON-TB Gold Plus: NEGATIVE

## 2020-05-09 LAB — HEPATITIS B SURFACE ANTIBODY,QUALITATIVE: Hep B Surface Ab, Qual: REACTIVE

## 2020-05-09 LAB — HEPATITIS C ANTIBODY: Hep C Virus Ab: <0.1 s/co ratio (ref 0.0–0.9)

## 2020-05-09 LAB — HEPATITIS B SURFACE ANTIGEN: Hepatitis B Surface Ag: NEGATIVE

## 2020-05-09 LAB — HEPATITIS B CORE ANTIBODY, TOTAL: Hep B Core Total Ab: NEGATIVE

## 2020-05-09 LAB — VITAMIN D 25 HYDROXY (VIT D DEFICIENCY, FRACTURES): Vit D, 25-Hydroxy: 34.5 ng/mL (ref 30.0–100.0)

## 2020-05-09 LAB — VARICELLA ZOSTER ANTIBODY, IGG: Varicella zoster IgG: 694 index (ref >165)

## 2020-05-09 LAB — VITAMIN B12: Vitamin B-12: 1454 pg/mL — ABNORMAL HIGH (ref 232–1245)

## 2020-05-09 NOTE — Telephone Encounter (Signed)
-----   Message from Britt Bottom, MD sent at 05/09/2020 12:31 PM EST ----- Labs are fine so she can start Rituxan/Truxima.  I filled out the sheet yesterday and looks like the form was sent in to confusion

## 2020-05-09 NOTE — Telephone Encounter (Signed)
Called and spoke with pt. Relayed results per Dr. Felecia Shelling note. She verbalized understanding. She wanted to know from Dr. Felecia Shelling is there is anything new she should be watching out for with this disease/treatment? If she is more prone to something, ect.. She will be here in about 7min for steroid IV treatment. Advised we will try and get her an answer while she is here for infusion, if not, I will call her back with Dr. Garth Bigness response. She verbalized understanding and appreciation.

## 2020-05-11 LAB — SARS-COV-2 ANTIBODY PROFILE
SARS-CoV-2 Antibodies: POSITIVE
SARS-CoV-2 Semi-Quant Total Ab: 1228 U/mL (ref <0.8)
SARS-CoV-2 Spike Ab Interp: POSITIVE

## 2020-05-11 LAB — SPECIMEN STATUS REPORT

## 2020-05-15 ENCOUNTER — Telehealth: Payer: Self-pay | Admitting: *Deleted

## 2020-05-15 NOTE — Telephone Encounter (Signed)
Left voice mail not able to reach the patient.

## 2020-05-17 NOTE — Telephone Encounter (Signed)
Gave completed/signed FMLA back to medical records to process for pt. 

## 2020-05-21 ENCOUNTER — Telehealth: Payer: Self-pay | Admitting: Neurology

## 2020-05-21 NOTE — Telephone Encounter (Signed)
FMLA paperwork faxed 2.7.22 to HR dept

## 2020-05-30 ENCOUNTER — Telehealth: Payer: Self-pay | Admitting: Neurology

## 2020-05-30 NOTE — Telephone Encounter (Signed)
We received feedback from [facility] that the rituximab (Truxima) has been denied because the insurance company feels that it is not medically necessary for her neuromyelitis optica.  I called [phone] to do a peer to peer but was told that it could not be done at this time but that they will need to schedule it.  I let the scheduler Fara Olden B) know that this is very urgent as exacerbations from neuromyelitis optica can lead to permanent impairment and that I would like them to schedule as soon as possible so that she may be initiated.  Case [id number]

## 2020-05-30 NOTE — Telephone Encounter (Signed)
Pt is asking for a call from [place], RN to discuss the delay in her getting the medication Truxima.  Pt was told a peer to peer may be needed  Re: the medication, please call pt at [phone].

## 2020-05-30 NOTE — Telephone Encounter (Signed)
Per Graylon Gunning, RN w/ [facility]: "As of Monday, it is still in review with insurance company". I asked her to follow up with patient to provide her an update.

## 2020-05-31 ENCOUNTER — Encounter: Payer: Self-pay | Admitting: Neurology

## 2020-06-01 NOTE — Telephone Encounter (Signed)
Faxed appeal letter to [phone]. Waiting on determination.

## 2020-06-04 NOTE — Telephone Encounter (Signed)
Took call from phone room. Spoke with [facility] appeals department. She was providing update on appeal. Final determination has not been made yet. They have interim update from medical director: denial upheld. They are waiting for outside review to be done. Should know final determination by today. [id number].

## 2020-06-05 ENCOUNTER — Telehealth: Payer: Self-pay | Admitting: Neurology

## 2020-06-05 MED ORDER — AZATHIOPRINE 50 MG PO TABS: ORAL_TABLET | ORAL | 11 refills | Status: DC

## 2020-06-05 MED ORDER — PREDNISONE 20 MG PO TABS: ORAL_TABLET | ORAL | 3 refills | Status: DC

## 2020-06-05 NOTE — Telephone Encounter (Signed)
Gave completed/signed orders for IV solumedrol 1Gx2 days to [facility]. Asked them to call pt to schedule her on 06/06/20 and 06/07/20.

## 2020-06-05 NOTE — Telephone Encounter (Signed)
Called to check on status of appeal. Appeal denied as of 06/05/20. They will fax denial letter to our office.

## 2020-06-05 NOTE — Telephone Encounter (Signed)
Insurance denied rituximab for her anti-MOG NMOSD despite appeals.  I will place her on prednisone plus azathioprine.  The prednisone will start at 60 mg and in 3 weeks she can start back to 40 mg.  We will continue a slow taper after I see her in about 5 weeks.  I will have her titrate the azathioprine quickly up to 150 mg a day in divided dose.  Hopefully this is going to control her NMOSD.  If there is breakthrough activity we would try again to have her insurance company allow [place] to use a stronger medication such as rituximab or Actemra  Unfortunately she does note that the vision in the left has worsened again compared to few days ago though not as bad as it was a couple weeks ago.  I will have her come in for 1 g of Solu-Medrol for 2 more days and then she will start the oral prednisone

## 2020-06-06 ENCOUNTER — Telehealth: Payer: Self-pay | Admitting: Neurology

## 2020-06-06 ENCOUNTER — Other Ambulatory Visit: Payer: Self-pay | Admitting: *Deleted

## 2020-06-06 MED ORDER — PREDNISONE 20 MG PO TABS: ORAL_TABLET | ORAL | 3 refills | Status: DC

## 2020-06-06 NOTE — Telephone Encounter (Signed)
Pt needs you to call [place] to give them the dosage instructions for her Prednisone. She needs it filled by Thursday. Thank you

## 2020-06-06 NOTE — Telephone Encounter (Signed)
I re-sent a prescription clarifying the directions for prednisone.

## 2020-07-03 ENCOUNTER — Telehealth: Payer: Self-pay | Admitting: *Deleted

## 2020-07-03 NOTE — Telephone Encounter (Signed)
Per Dr. Felecia Shelling, pt would like [place] to do second level appeal for rituximab. I called insurance at [phone]. Spoke with Gerald Stabs. They will fax appeal denial letter to [place] at [phone]. [id number]

## 2020-07-04 NOTE — Telephone Encounter (Signed)
Received denial letter, gave to MD

## 2020-07-05 ENCOUNTER — Ambulatory Visit: Payer: Commercial Managed Care - PPO | Admitting: Internal Medicine

## 2020-07-05 ENCOUNTER — Ambulatory Visit (INDEPENDENT_AMBULATORY_CARE_PROVIDER_SITE_OTHER): Payer: Commercial Managed Care - PPO | Admitting: Cardiology

## 2020-07-05 ENCOUNTER — Encounter: Payer: Self-pay | Admitting: Cardiology

## 2020-07-05 ENCOUNTER — Ambulatory Visit (HOSPITAL_COMMUNITY): Payer: Commercial Managed Care - PPO | Attending: Cardiovascular Disease

## 2020-07-05 ENCOUNTER — Other Ambulatory Visit: Payer: Self-pay

## 2020-07-05 VITALS — BP 107/76 | HR 82 | Ht 65.5 in | Wt 174.4 lb

## 2020-07-05 DIAGNOSIS — R0609 Other forms of dyspnea: Secondary | ICD-10-CM

## 2020-07-05 DIAGNOSIS — R079 Chest pain, unspecified: Secondary | ICD-10-CM | POA: Insufficient documentation

## 2020-07-05 DIAGNOSIS — R0600 Dyspnea, unspecified: Secondary | ICD-10-CM | POA: Diagnosis not present

## 2020-07-05 DIAGNOSIS — R072 Precordial pain: Secondary | ICD-10-CM

## 2020-07-05 HISTORY — PX: TRANSTHORACIC ECHOCARDIOGRAM: SHX275

## 2020-07-05 LAB — ECHOCARDIOGRAM COMPLETE
Area-P 1/2: 5.54 cm2
Height: 65.5 in
S' Lateral: 3.1 cm
Weight: 2790.4 oz

## 2020-07-05 LAB — TROPONIN T: Troponin T (Highly Sensitive): 10 ng/L (ref 0–14)

## 2020-07-05 MED ORDER — METOPROLOL TARTRATE 100 MG PO TABS: ORAL_TABLET | ORAL | 0 refills | Status: DC

## 2020-07-05 NOTE — Patient Instructions (Addendum)
Medication Instructions:  Your physician recommends that you continue on your current medications as directed. Please refer to the Current Medication list given to you today.  *If you need a refill on your cardiac medications before your next appointment, please call your pharmacy*  Lab Work: TODAY: troponin, CRP, ESR, BMET  If you have labs (blood work) drawn today and your tests are completely normal, you will receive your results only by: Marland Kitchen MyChart Message (if you have MyChart) OR . A paper copy in the mail If you have any lab test that is abnormal or we need to change your treatment, we will call you to review the results.   Testing/Procedures: Your physician has requested that you have an echocardiogram. Echocardiography is a painless test that uses sound waves to create images of your heart. It provides your doctor with information about the size and shape of your heart and how well your heart's chambers and valves are working. This procedure takes approximately one hour. There are no restrictions for this procedure.  Your physician has requested that you have a coronary CTA scan. Please see below for further instructions.    Follow-Up: At [facility], you and your health needs are our priority.  As part of our continuing mission to provide you with exceptional heart care, we have created designated Provider Care Teams.  These Care Teams include your primary Cardiologist (physician) and Advanced Practice Providers (APPs -  Physician Assistants and Nurse Practitioners) who all work together to provide you with the care you need, when you need it.  Follow up with Dr. Radford Pax as needed based on results of testing.   Your cardiac CT will be scheduled at one of the below locations:   [facility] [address] [place], [place] [address] [phone]  If scheduled at [facility], please arrive at the [facility] main entrance (entrance A) of [facility]  30 minutes prior to test start time. Proceed to the [facility] Radiology Department (first floor) to check-in and test prep.  Please follow these instructions carefully (unless otherwise directed):   On the Night Before the Test: . Be sure to Drink plenty of water. . Do not consume any caffeinated/decaffeinated beverages or chocolate 12 hours prior to your test. . Do not take any antihistamines 12 hours prior to your test.  On the Day of the Test: . Drink plenty of water until 1 hour prior to the test. . Do not eat any food 4 hours prior to the test. . You may take your regular medications prior to the test.  . Take metoprolol (Lopressor) two hours prior to test. . FEMALES- please wear underwire-free bra if available      After the Test: . Drink plenty of water. . After receiving IV contrast, you may experience a mild flushed feeling. This is normal. . On occasion, you may experience a mild rash up to 24 hours after the test. This is not dangerous. If this occurs, you can take Benadryl 25 mg and increase your fluid intake. . If you experience trouble breathing, this can be serious. If it is severe call 911 IMMEDIATELY. If it is mild, please call our office. . If you take any of these medications: Glipizide/Metformin, Avandament, Glucavance, please do not take 48 hours after completing test unless otherwise instructed.   Once we have confirmed authorization from your insurance company, we will call you to set up a date and time for your test. Based  on how quickly your insurance processes prior authorizations requests, please allow up to 4 weeks to be contacted for scheduling your Cardiac CT appointment. Be advised that routine Cardiac CT appointments could be scheduled as many as 8 weeks after your provider has ordered it.  For non-scheduling related questions, please contact the cardiac imaging nurse navigator should you have any questions/concerns: Marchia Bond, Cardiac Imaging Nurse  Navigator Gordy Clement, Cardiac Imaging Nurse Navigator [facility] Direct Office Dial: [phone]   For scheduling needs, including cancellations and rescheduling, please call [place], [phone].

## 2020-07-05 NOTE — Addendum Note (Signed)
Addended by: Antonieta Iba on: 07/05/2020 11:43 AM   Modules accepted: Orders

## 2020-07-05 NOTE — Progress Notes (Addendum)
Cardiology Consult Note    Date:  07/05/2020   ID:  Valerie [name], DOB 09/18/1970, MRN [id number]  PCP:  Lyman Bishop, DO  Cardiologist:  Fransico Him, MD   Chief Complaint  Patient presents with  . New Patient (Initial Visit)    Chest pain and DOE    History of Present Illness:  Valerie [name] is a 50 y.o. female who is being seen today for the evaluation of Chest pain and SOB at the request of Carolee Rota, NP.  This is a 50yo female with a hx of Hashimoto's disease and hypothyroidism.  She was recently seen by her PCP for complaints of chest pain and SOB and is now referred for evaluation.  She tells me that last Friday night she awakened early in the am with chest discomfort that felt like an "elephant on my chest".  She also said it felt like a dull ache.  She took Motrin and it went away.  The next night the same sx occurred again around 2am and again on Sunday night.  She sat up but felt SOB.  The discomfort radiated into her shoulders and down her arms.  Motrin relieves the discomfort.  She says that she had a fever several days prior for 3 days and was COVID 19 neg.  She denies any diaphoresis or nausea.    Past Medical History:  Diagnosis Date  . Hashimoto's disease   . Hypothyroidism     Past Surgical History:  Procedure Laterality Date  . TUBAL LIGATION  02/2012    Current Medications: Current Meds  Medication Sig  . Ascorbic Acid (VITA-C PO) Take 1,000 mg by mouth daily.  Marland Kitchen azaTHIOprine (IMURAN) 50 MG tablet Take one po q Am and two po q evening  . cholecalciferol (VITAMIN D3) 25 MCG (1000 UNIT) tablet Take 5,000 Units by mouth daily.  Marland Kitchen levothyroxine (SYNTHROID) 75 MCG tablet Take 75 mcg by mouth daily.  . predniSONE (DELTASONE) 20 MG tablet Take a total of 31m daily (3 tablets per day) in the morning, for 3 weeks. Then go down to 40 mg daily (2 tablets per day) in the morning.  . Probiotic Product (CULTRELLE KIDS IMMUNE DEFENSE PO) Take 1 tablet by  mouth daily.    Allergies:   Patient has no known allergies.   Social History   Socioeconomic History  . Marital status: Married    Spouse name: Not on file  . Number of children: 2  . Years of education: BS  . Highest education level: Not on file  Occupational History  . Occupation: SAgricultural consultant Tobacco Use  . Smoking status: Never Smoker  . Smokeless tobacco: Never Used  Vaping Use  . Vaping Use: Never used  Substance and Sexual Activity  . Alcohol use: Never  . Drug use: Never  . Sexual activity: Yes    Birth control/protection: None  Other Topics Concern  . Not on file  Social History Narrative   Right handed    Caffeine use: rare   Social Determinants of Health   Financial Resource Strain: Not on file  Food Insecurity: Not on file  Transportation Needs: Not on file  Physical Activity: Not on file  Stress: Not on file  Social Connections: Not on file     Family History:  The patient's family history includes Amblyopia in her father; Colon polyps in her mother; Eczema in her mother; GER disease in her father and mother; Glaucoma in her maternal grandmother;  Heart murmur in her father; Hyperlipidemia in her father; Hypertension in her mother; Hypothyroidism in her mother; Macular degeneration in her maternal grandmother; Migraines in her mother; Multiple sclerosis in her paternal grandmother; Neuropathy in her father; Osteoarthritis in her mother; Other in her father and mother.   ROS:   Please see the history of present illness.    ROS All other systems reviewed and are negative.  No flowsheet data found.     PHYSICAL EXAM:   VS:  BP 107/76   Pulse 82   Ht 5' 5.5" (1.664 m)   Wt 174 lb 6.4 oz (79.1 kg)   SpO2 96%   BMI 28.58 kg/m    GEN: Well nourished, well developed, in no acute distress  HEENT: normal  Neck: no JVD, carotid bruits, or masses Cardiac: RRR; no murmurs, rubs, or gallops,no edema.  Intact distal pulses bilaterally.  Respiratory:   clear to auscultation bilaterally, normal work of breathing GI: soft, nontender, nondistended, + BS MS: no deformity or atrophy  Skin: warm and dry, no rash Neuro:  Alert and Oriented x 3, Strength and sensation are intact Psych: euthymic mood, full affect  Wt Readings from Last 3 Encounters:  07/05/20 174 lb 6.4 oz (79.1 kg)  05/07/20 172 lb 8 oz (78.2 kg)  04/24/20 168 lb (76.2 kg)      Studies/Labs Reviewed:   EKG:  EKG is ordered today.  The ekg ordered today demonstrates >  DOE  Recent Labs: 05/06/2020: ALT 27; BUN 13; Creatinine, Ser 1.05; Potassium 4.5; Sodium 138 05/07/2020: Hemoglobin 15.0; Platelets 220   Lipid Panel No results found for: CHOL, TRIG, HDL, CHOLHDL, VLDL, LDLCALC, LDLDIRECT    Additional studies/ records that were reviewed today include:  OV notes from PCP    ASSESSMENT:    1. Chest pain of uncertain etiology   2. DOE (dyspnea on exertion)      PLAN:  In order of problems listed above:  1. Chest pain -her pain is atypical in that it only occurs at night ? GERD with esophageal spasm -she also just had a viral syndrome with high fevers for 3 days??she is unvaccinated for COVID but did a COVID test that was negative >> need to consider acute pericarditis -check hsTrop, ESR and CRP -check 2D echo to rule out pericardial effusion especially since Cxray at PCP office showed CM and Cxray in January was normal - I will get a coronary CTA to define coronary anatomy if all other tests are normal  2.  DOE -unclear whether related to recent viral syndrome -repeat COVID 19 test -check 2D echo to assess LVF and diastolic function and rule out pericardial effusion    Medication Adjustments/Labs and Tests Ordered: Current medicines are reviewed at length with the patient today.  Concerns regarding medicines are outlined above.  Medication changes, Labs and Tests ordered today are listed in the Patient Instructions below.  There are no Patient  Instructions on file for this visit.   Signed, Fransico Him, MD  07/05/2020 11:24 AM    [facility], [place], [place]  [address] Phone: [phone]; Fax: [phone]

## 2020-07-06 ENCOUNTER — Other Ambulatory Visit: Payer: Self-pay | Admitting: *Deleted

## 2020-07-06 ENCOUNTER — Encounter: Payer: Self-pay | Admitting: Cardiology

## 2020-07-06 DIAGNOSIS — I340 Nonrheumatic mitral (valve) insufficiency: Secondary | ICD-10-CM | POA: Insufficient documentation

## 2020-07-06 DIAGNOSIS — R079 Chest pain, unspecified: Secondary | ICD-10-CM

## 2020-07-06 LAB — BASIC METABOLIC PANEL
BUN/Creatinine Ratio: 17 (ref 9–23)
BUN: 16 mg/dL (ref 6–24)
CO2: 25 mmol/L (ref 20–29)
Calcium: 9.2 mg/dL (ref 8.7–10.2)
Chloride: 98 mmol/L (ref 96–106)
Creatinine, Ser: 0.92 mg/dL (ref 0.57–1.00)
Glucose: 103 mg/dL — ABNORMAL HIGH (ref 65–99)
Potassium: 4.8 mmol/L (ref 3.5–5.2)
Sodium: 137 mmol/L (ref 134–144)
eGFR: 76 mL/min/{1.73_m2} (ref >59)

## 2020-07-06 LAB — SEDIMENTATION RATE: Sed Rate: 4 mm/hr (ref 0–40)

## 2020-07-06 LAB — HIGH SENSITIVITY CRP: CRP, High Sensitivity: 37.94 mg/L — ABNORMAL HIGH (ref 0.00–3.00)

## 2020-07-08 ENCOUNTER — Encounter: Payer: Self-pay | Admitting: Neurology

## 2020-07-08 NOTE — Progress Notes (Signed)
July 08, 2020   May 31, 2020   [place] [address] Fax [phone]  Valerie [name]   DOB [birth year] Case ref:  [phone] [phone]  To whom it may concern:  I am requesting an external review concerning your denial to authorize rituximab therapy for Valerie [name].   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  This is a rare neurologic disorder that can lead to repeated severe episodes of optic neuritis and/or transverse myelitis.    She presented with right optic neuritis in December 2021 and was hospitalized and treated with IV Solu-Medrol with some improvement.  During that hospital stay, she was found to have elevated levels of anti-MOG.  MRI of the orbits showed enhancement of the right optic nerve consistent with optic neuritis.  MRI of the brain and the spinal cord were normal.   On 05/05/2020 she had the onset of left optic neuritis.  I saw her 2 days later and she received an additional course of IV Solu-Medrol.  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  I originally requested rituximab and it was denied.  I then placed her on oral steroids and azathioprine therapy.  Unfortunately, she is unable to tolerate this therapy requiring an ED visit and referral to cardiology for chest pain and shortness of breath.   Although anti-NMO neuromyelitis optica has several FDA approved therapies, the MOGAD variant is more rare and has no FDA approved treatment - if there was I would be happy to prescribe it.    Recent studies show that monoclonal antibodies such as rituximab and tocilizumab have benefit in reducing future exacerbations.   Rituximab would be prescribed at a dose of 1000 mg x 2, 2 weeks apart and repeated every 6 months.  Alternatively, if you do not authorize rituximab, could you authorize tocilizumab (8.0  mg/kg IV monthly) which was found to be very effective in a recent study.    I have attached relevant recent abstracts.    Please allow her to be treated with one of these monoclonal antibodies to prevent additional exacerbations that can lead to permanent impairments including blindness and paralysis.  Sincerely,    Rondell Pardon A. Felecia Shelling, MD, PhD, FAAN Certified in Neurology, Clinical Neurophysiology, Sleep Medicine, Pain Medicine and Neuroimaging Director, [place] [facility]  ABSTRACT RITUXIMAB  [place] G, Kharel S, Idalou [place], [place], Ojha R. Safety and efficacy of rituximab for relapse prevention in myelin oligodendrocyte glycoprotein immunoglobulin G (MOG-IgG)-associated disorders (MOGAD): A systematic review and meta-analysis. J Neuroimmunol. 2022 Mar 02;542:706237. doi: 10.1016/j.jneuroim.6283.151761. Epub 2022 Jan 13. PMID: 60737106.  Abstract Introduction: Myelin oligodendrocyte glycoprotein immunoglobulin G (MOG-IgG)-associated disorders (MOGAD) is neuroimmunological disorder manifesting as episodes of ADEM, optic neuritis, transverse myelitis, brainstem encephalitis, and other CNS manifestations and notably, distinct from multiple sclerosis (MS) and neuromyelitis optica spectrum disorders (NMOSD). Current treatment strategy is high-dose intravenous methylprednisolone followed by maintenance immunotherapy for relapse prevention. The purpose of this study is to systematically create compelling evidence addressing the role of rituximab in relapse prevention among patient with MOGAD.  Methods: This study follows the PRISMA (Preferred Reporting Items for Systematic Reviews and Meta-Analyses) guideline. We searched PubMed, Embase, and [employer] for [place] language papers published between 2010 and 2021. Individual study proportions were meta-analyzed to yield the pooled relapse-free patient proportion. Individual studies' mean pre- and  post-treatment annualized relapse ratio (ARR) and Expanded Disability Status Scale (EDSS) were used to calculate the overall mean difference.  Results: Our meta-analysis includes 13 studies with 238 subjects. After rituximab treatment, 55% (95% CI: 0.49-0.61) of MOGAD patients remained relapse-free. Our study found that after rituximab therapy, ARR was lowered by 1.36 (95% CI 1.02-1.71, p < 0.001). Similarly, we detected a 0.52 (95% CI: 0.08 to 0.96, p = 0.02) difference in EDSS score after starting rituximab medication. Because only a handful of the included studies documented adverse events, the safety profile of rituximab for the treatment of MOGAD could not be effectively determined.  Conclusion: Our meta-analysis shows that rituximab effectively prevents relapses in MOGAD patients.  ABSTRACT TOCILIZUMAB  Ringelstein et al; Neuromyelitis Optica Study Group (NEMOS). Interleukin-6 Receptor Blockade in Treatment-Refractory MOG-IgG-Associated Disease and Neuromyelitis Optica Spectrum Disorders. Neurol Neuroimmunol Neuroinflamm. 2021 Nov 16;9(1):e1100. doi: 10.1212/NXI.0000000000001100. PMID: 03500938; PMCID: [id number].  Abstract Background and objectives: To evaluate the long-term safety and efficacy of tocilizumab (TCZ), a humanized anti-interleukin-6 receptor antibody in myelin oligodendrocyte glycoprotein-IgG-associated disease (MOGAD) and neuromyelitis optica spectrum disorders (NMOSD).  Methods: Annualized relapse rate (ARR), Expanded Disability Status Scale score, MRI, autoantibody titers, pain, and adverse events were retrospectively evaluated in 58 patients with MOGAD (n = 14), aquaporin-4 (AQP4)-IgG seropositive (n = 36), and seronegative NMOSD (n = 7; 12%), switched to TCZ from previous immunotherapies, particularly rituximab.  Results: Patients received TCZ for 23.8 months (median; interquartile range 13.0-51.1 months), with an IV dose of 8.0 mg/kg (median; range 6-12 mg/kg) every 31.6  days (mean; range 26-44 days). For MOGAD, the median ARR decreased from 1.75 (range 0.5-5) to 0 (range 0-0.9; p = 0.0011) under TCZ. A similar effect was seen for AQP4-IgG+ (ARR reduction from 1.5 [range 0-5] to 0 [range 0-4.2]; p < 0.001) and for seronegative NMOSD (from 3.0 [range 1.0-3.0] to 0.2 [range 0-2.0]; p = 0.031). During TCZ, 60% of all patients were relapse free (79% for MOGAD, 56% for AQP4-IgG+, and 43% for seronegative NMOSD). Disability follow-up indicated stabilization. MRI inflammatory activity decreased in MOGAD (p = 0.04; for the brain) and in AQP4-IgG+ NMOSD (p < 0.001; for the spinal cord). Chronic pain was unchanged. Regarding only patients treated with TCZ for at least 12 months (n = 44), ARR reductions were confirmed, including the subgroups of MOGAD (n = 11) and AQP4-IgG+ patients (n = 28). Similarly, in the group of patients treated with TCZ for at least 12 months, 59% of them were relapse free, with 73% for MOGAD, 57% for AQP4-IgG+, and 40% for patients with seronegative NMOSD. No severe or unexpected safety signals were observed. Add-on therapy showed no advantage compared with TCZ monotherapy.  Discussion: This study provides Class III evidence that long-term TCZ therapy is safe and reduces relapse probability in MOGAD and AQP4-IgG+ NMOSD.

## 2020-07-09 NOTE — Telephone Encounter (Signed)
Faxed appeal letter to [facility] appeals/[facility] at [phone]. Marked urgent. Received fax confirmation. Waiting on determination.

## 2020-07-10 ENCOUNTER — Ambulatory Visit (INDEPENDENT_AMBULATORY_CARE_PROVIDER_SITE_OTHER): Payer: Commercial Managed Care - PPO | Admitting: Neurology

## 2020-07-10 ENCOUNTER — Encounter: Payer: Self-pay | Admitting: Neurology

## 2020-07-10 ENCOUNTER — Other Ambulatory Visit: Payer: Commercial Managed Care - PPO

## 2020-07-10 ENCOUNTER — Other Ambulatory Visit: Payer: Self-pay

## 2020-07-10 VITALS — BP 126/82 | HR 84 | Ht 65.5 in | Wt 176.0 lb

## 2020-07-10 DIAGNOSIS — G378 Other specified demyelinating diseases of central nervous system: Secondary | ICD-10-CM | POA: Insufficient documentation

## 2020-07-10 DIAGNOSIS — Z79899 Other long term (current) drug therapy: Secondary | ICD-10-CM

## 2020-07-10 DIAGNOSIS — R079 Chest pain, unspecified: Secondary | ICD-10-CM

## 2020-07-10 DIAGNOSIS — H469 Unspecified optic neuritis: Secondary | ICD-10-CM | POA: Diagnosis not present

## 2020-07-10 NOTE — Progress Notes (Signed)
[facility]  PATIENT: Valerie [name] DOB: [birth year]  REFERRING DOCTOR OR PCP:  Dr. Curly Rim SOURCE: Patient, notes from recent hospitalization, laboratory results, MRI images personally reviewed.  _________________________________   HISTORICAL  CHIEF COMPLAINT:  Chief Complaint  Patient presents with  . Follow-up    RM 12. Last seen 05/07/20. Rituximib not approved yet. Vision seems stable.     HISTORY OF PRESENT ILLNESS:  Valerie [name], at [facility] for neurologic consultation regarding her optic neuritis and positive anti-MOG antibodies  Update 07/10/2020: She is currently on azathioprine 50 mg qAM and 100 mg qHS along with prednisone 40 mg daily.  She ws on lower dose azathioprine at first due to tolerability.   She is not tolerating these medications well.  So we will get authorization to place her on Rituxan or Actemra.  These medicines have been shown to show efficacy for MOGantibody disease.  Unfortunately, insurance would not authorize.  She also has hypothyroidism.  She had two definite episodes of ON, the right December 2021 and the left 05/05/2020.   Vision is worse on the left than the right.  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  Colors are still desaturated OS  She denies any change in her gait or balance.  She still feels that the gait is mildly off though it is about the same as last visit.  She notes no difficulty with strength or sensation.  Bladder function is fine.  Cognition is fine.   She had Covid 11/2019.   She did not do the vaccination.     HISTORY MOGAD / ON She  had right >> left eye pain at the end of December 2021.  On 04/20/2020 she began tp have reduced vision OD and photophobia.    She saw an ophthalmologist who felt she had dry eyes.   She saw Dr. Glenis Smoker the next day who diagnosed her with right optic neuritis.  She was referred to Dr.  Baird Cancer who did OCT c/with optic neuritis.   She was admittted 04/24/2020 to 04/29/2020 and received 5 days IV Solu-Medrol.   Vision improved with the steroids.   Upon discharge, she still had reduced visual acuity but good color vision.   She had only mild eye aching and mild photophobia.     She continued to be stable for the next few days.   On 05/05/2020, 2 days ago, she had aching in the left eye.    She went to the ED 05/06/20 after speaking with Dr. Brett Fairy,  Unfortunately, after 6 hours, she left without being seen.      She has Hashimoto's thyroiditis.   She has had synthroid dose adjusted and gets retested soon  Imaging studies: MRI of the orbits, and brain 04/24/2020 showed evidence of right optic neuritis.  The brain was otherwise normal.  MRI of the cervical and thoracic spine 04/24/2020 showed no demyelination.  She does have some mild degenerative changes in the cervical spine.  Data review: Imaging: MRI of the brain 04/24/2020 was normal.  MRI of the orbits 04/24/2020 showed abnormal signal with enhancement in the right optic nerve consistent with optic neuritis.  MRI of the cervical spine and MRI of the thoracic spine 04/24/2020 showed normal spinal cord and some degenerative changes with moderate right foraminal narrowing at C5-C6.  Laboratory data: Labs 04/25/2020:   Anti-MOG antibodies were high titer positive (1: 320).  HIV was negative.   ANA/Sjogrn's antibodies were negative.   RPR was negative,  Anti-NMO Ab was negative,   HgbA1c was normal 5.4.  Sugars ranged 110-183.      REVIEW OF SYSTEMS: Constitutional: No fevers, chills, sweats, or change in appetite Eyes: No visual changes, double vision, eye pain Ear, nose and throat: No hearing loss, ear pain, nasal congestion, sore throat Cardiovascular: No chest pain, palpitations Respiratory: No shortness of breath at rest or with exertion.   No wheezes GastrointestinaI: No nausea, vomiting, diarrhea, abdominal pain, fecal  incontinence Genitourinary: No dysuria, urinary retention or frequency.  No nocturia. Musculoskeletal: No neck pain, back pain Integumentary: No rash, pruritus, skin lesions Neurological: as above Psychiatric: No depression at this time.  No anxiety Endocrine: No palpitations, diaphoresis, change in appetite, change in weigh or increased thirst Hematologic/Lymphatic: No anemia, purpura, petechiae. Allergic/Immunologic: No itchy/runny eyes, nasal congestion, recent allergic reactions, rashes  ALLERGIES: No Known Allergies  HOME MEDICATIONS:  Current Outpatient Medications:  .  Ascorbic Acid (VITA-C PO), Take 1,000 mg by mouth daily., Disp: , Rfl:  .  azaTHIOprine (IMURAN) 50 MG tablet, Take one po q Am and two po q evening, Disp: 90 tablet, Rfl: 11 .  cholecalciferol (VITAMIN D3) 25 MCG (1000 UNIT) tablet, Take 5,000 Units by mouth daily., Disp: , Rfl:  .  levothyroxine (SYNTHROID) 88 MCG tablet, Take 88 mcg by mouth daily before breakfast., Disp: , Rfl:  .  metoprolol tartrate (LOPRESSOR) 100 MG tablet, Take 1 tablet (100 mg total) two hours prior to CT scan., Disp: 1 tablet, Rfl: 0 .  predniSONE (DELTASONE) 20 MG tablet, Take a total of 60mg  daily (3 tablets per day) in the morning, for 3 weeks. Then go down to 40 mg daily (2 tablets per day) in the morning., Disp: 90 tablet, Rfl: 3 .  Probiotic Product (CULTRELLE KIDS IMMUNE DEFENSE PO), Take 1 tablet by mouth daily., Disp: , Rfl:   PAST MEDICAL HISTORY: Past Medical History:  Diagnosis Date  . Hashimoto's disease   . Hypothyroidism   . Mitral regurgitation    mild by echo 06/2020    PAST SURGICAL HISTORY: Past Surgical History:  Procedure Laterality Date  . TUBAL LIGATION  02/2012    FAMILY HISTORY: Family History  Problem Relation Age of Onset  . GER disease Mother   . Other Mother        uterine fibroids, optic disc abnormality   . Eczema Mother   . Colon polyps Mother   . Hypothyroidism Mother   .  Osteoarthritis Mother   . Hypertension Mother   . Migraines Mother   . Other Father        Colon adenoma  . Hyperlipidemia Father   . GER disease Father   . Heart murmur Father   . Amblyopia Father        Right eye  . Neuropathy Father   . Glaucoma Maternal Grandmother   . Macular degeneration Maternal Grandmother   . Multiple sclerosis Paternal Grandmother   . Breast cancer Neg Hx     SOCIAL HISTORY:  Social History   Socioeconomic History  . Marital status: Married    Spouse name: Not on file  . Number of children: 2  . Years of education: BS  . Highest education level: Not on file  Occupational History  . Occupation: Agricultural consultant  Tobacco Use  . Smoking status: Never Smoker  . Smokeless tobacco: Never Used  Vaping Use  . Vaping  Use: Never used  Substance and Sexual Activity  . Alcohol use: Never  . Drug use: Never  . Sexual activity: Yes    Birth control/protection: None  Other Topics Concern  . Not on file  Social History Narrative   Right handed    Caffeine use: rare   Social Determinants of Health   Financial Resource Strain: Not on file  Food Insecurity: Not on file  Transportation Needs: Not on file  Physical Activity: Not on file  Stress: Not on file  Social Connections: Not on file  Intimate Partner Violence: Not on file     PHYSICAL EXAM  Vitals:   07/10/20 1040  BP: 126/82  Pulse: 84  Weight: 176 lb (79.8 kg)  Height: 5' 5.5" (1.664 m)    Body mass index is 28.84 kg/m.  No exam data present   General: The patient is well-developed and well-nourished and in no acute distress  HEENT:  Head is [place]/AT.  Sclera are anicteric.    Skin: Extremities are without rash or  edema.   Neurologic Exam  Mental status: The patient is alert and oriented x 3 at the time of the examination. The patient has apparent normal recent and remote memory, with an apparently normal attention span and concentration ability.   Speech is  normal.  Cranial nerves: Extraocular movements are full.  Moving the eyes laterally causes pain in the left eye.  She has a 2+ left APD.  Marland Kitchen Visual acuity was 20/20 OD 20/30 OS.  Color vision was reduced OS.  Facial strength and sensation was normal.. No obvious hearing deficits are noted.  Motor:  Muscle bulk is normal.   Tone is normal. Strength is  5 / 5 in all 4 extremities.   Sensory: Sensory testing is intact to pinprick, soft touch and vibration sensation in all 4 extremities.  Coordination: Cerebellar testing reveals good finger-nose-finger and heel-to-shin bilaterally.  Gait and station: Station is normal.   Gait is normal. Tandem gait is mildly wide. Romberg is negative.   Reflexes: Deep tendon reflexes are symmetric and normal bilaterally.     DIAGNOSTIC DATA (LABS, IMAGING, TESTING) - I reviewed patient records, labs, notes, testing and imaging myself where available.  Lab Results  Component Value Date   WBC 8.5 05/07/2020   HGB 15.0 05/07/2020   HCT 44.6 05/07/2020   MCV 94 05/07/2020   PLT 220 05/07/2020      Component Value Date/Time   NA 137 07/05/2020 1154   K 4.8 07/05/2020 1154   CL 98 07/05/2020 1154   CO2 25 07/05/2020 1154   GLUCOSE 103 (H) 07/05/2020 1154   GLUCOSE 97 05/06/2020 1429   BUN 16 07/05/2020 1154   CREATININE 0.92 07/05/2020 1154   CALCIUM 9.2 07/05/2020 1154   PROT 6.7 05/06/2020 1429   ALBUMIN 3.4 (L) 05/06/2020 1429   AST 15 05/06/2020 1429   ALT 27 05/06/2020 1429   ALKPHOS 41 05/06/2020 1429   BILITOT 0.4 05/06/2020 1429   GFRNONAA >60 05/06/2020 1429   No results found for: CHOL, HDL, LDLCALC, LDLDIRECT, TRIG, CHOLHDL Lab Results  Component Value Date   HGBA1C 5.4 04/25/2020       ASSESSMENT AND PLAN  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   Continue azathioprine 150 mg/day.   Taper steroid slowly to 20 mg po qod --- we will try to  taper further at nest visit.  2.   I still feel Rituxan or Actemra is a better option for her and we will try to get approval. 3.   rtc 3 months  45-minute office visit with the majority of the time spent face-to-face for history and physical, discussion/counseling and decision-making.  Additional time with record review and documentation and letter to insurance for an external review.   Czarina Gingras A. Felecia Shelling, MD, [facility] 2/48/1859, 09:31 AM Certified in Neurology, Clinical Neurophysiology, Sleep Medicine and Neuroimaging  [facility] [address] [place], [place] [address] [phone]

## 2020-07-11 LAB — SEDIMENTATION RATE: Sed Rate: 2 mm/hr (ref 0–40)

## 2020-07-11 LAB — HIGH SENSITIVITY CRP: CRP, High Sensitivity: 4.2 mg/L — ABNORMAL HIGH (ref 0.00–3.00)

## 2020-07-11 NOTE — Telephone Encounter (Signed)
Called [employer] at [phone], spoke with Butch Penny (intake coordinator). She advised I would need to speak w/ appeals team (their extension: [phone]). She transferred me but call ended, automated system stated it could not connect and advised me to call back.  I called back and put in extension. Had to LVM for someone to call back. Provided pt name/DOB and ref# for case. Advised we are open Mon-Thurs 8-5pm and closed on Friday's.

## 2020-07-12 ENCOUNTER — Telehealth (HOSPITAL_COMMUNITY): Payer: Self-pay | Admitting: *Deleted

## 2020-07-12 NOTE — Telephone Encounter (Signed)
Reaching out to patient to offer assistance regarding upcoming cardiac imaging study; pt verbalizes understanding of appt date/time, parking situation and where to check in, pre-test NPO status and medications ordered, and verified current allergies; name and call back number provided for further questions should they arise  Saad Buhl RN Navigator Cardiac Imaging [facility] [phone] office [phone] cell  

## 2020-07-13 ENCOUNTER — Ambulatory Visit (HOSPITAL_COMMUNITY)
Admission: RE | Admit: 2020-07-13 | Discharge: 2020-07-13 | Disposition: A | Discharge status: Home / Self Care | Payer: Commercial Managed Care - PPO | Source: Ambulatory Visit | Attending: Cardiology | Admitting: Cardiology

## 2020-07-13 ENCOUNTER — Other Ambulatory Visit: Payer: Self-pay

## 2020-07-13 DIAGNOSIS — R072 Precordial pain: Secondary | ICD-10-CM | POA: Insufficient documentation

## 2020-07-13 DIAGNOSIS — Z006 Encounter for examination for normal comparison and control in clinical research program: Secondary | ICD-10-CM

## 2020-07-13 HISTORY — PX: CT CORONARY CA SCORING: HXRAD806

## 2020-07-13 IMAGING — CT CT HEART MORP W/ CTA COR W/ SCORE W/ CA W/CM &/OR W/O CM
4 of 7 series · 8 of 20 positions shown, 9 images · IV contrast (APPLIED)
Comparison: None.

Addendum:
CLINICAL DATA: 49F with chest pain and shortness of breath.

EXAM:
Cardiac/Coronary  CT
TECHNIQUE: The patient was scanned on a [employer] scanner.

[Series 6: best diast 75 % · axial · 0.38mm/px · z∈[-177,-136]mm · 2 of 306 slices shown]
[im 102/306  vessel]
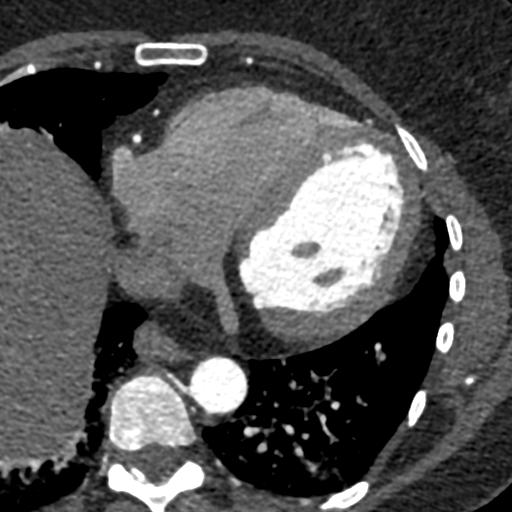
[im 204/306  vessel]
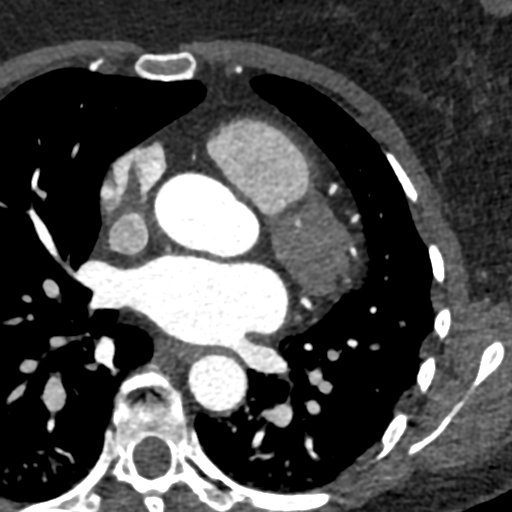

[Series 7: best syst · axial · 0.38mm/px · z∈[-177,-136]mm · 2 of 306 slices shown, 3 images]
[im 102/306  vessel]
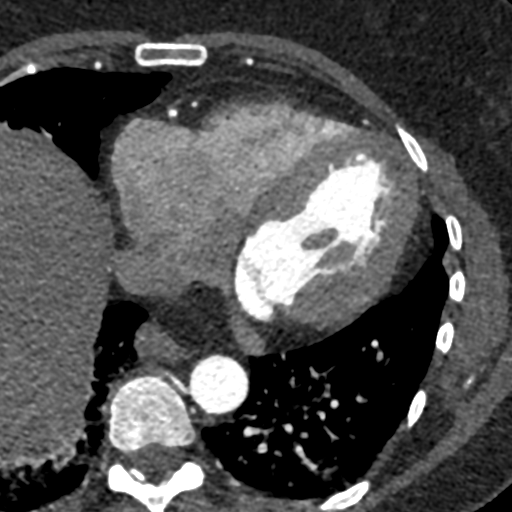
[im 102/306  lung]
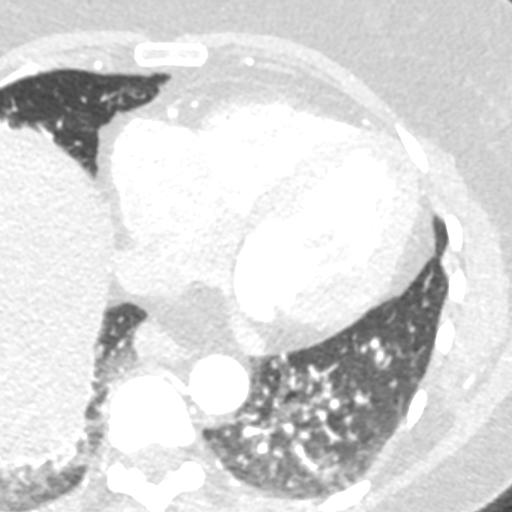
[im 204/306  vessel]
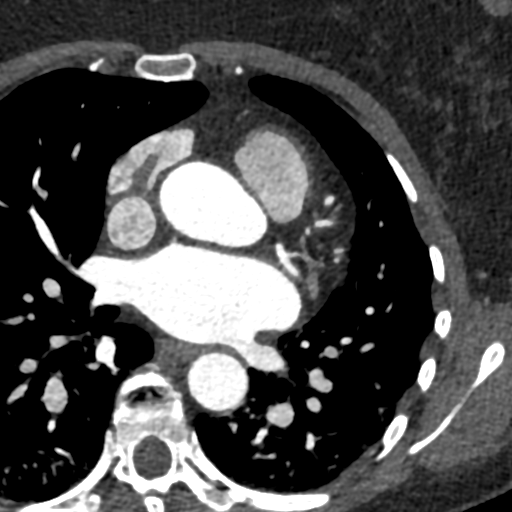

[Series 8: ts diast sharp 75 % · axial · 0.38mm/px · z∈[-177,-136]mm · 2 of 306 slices shown]
[im 102/306  lung]
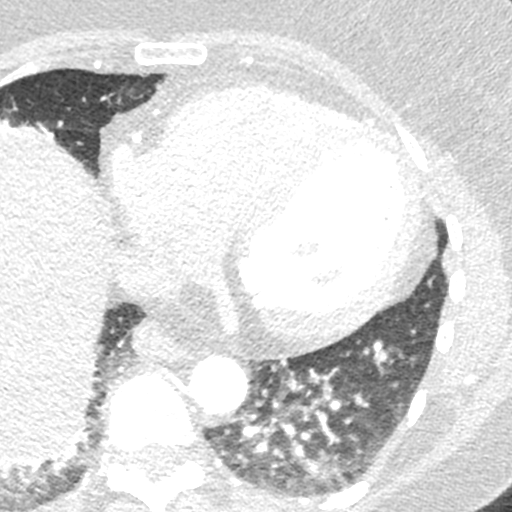
[im 204/306  lung]
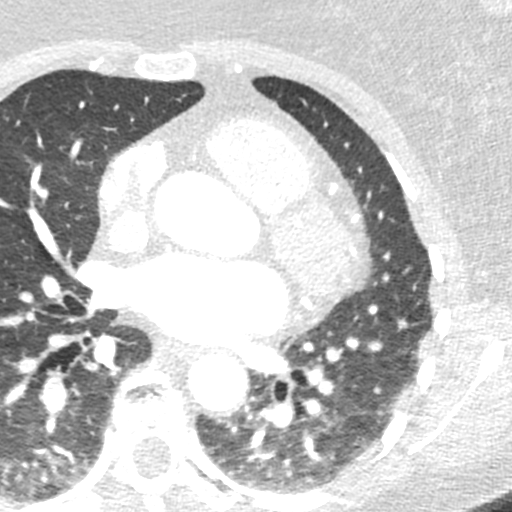

[Series 9: ts syst sharp 41 % · axial · 0.38mm/px · z∈[-177,-136]mm · 2 of 306 slices shown]
[im 102/306  lung]
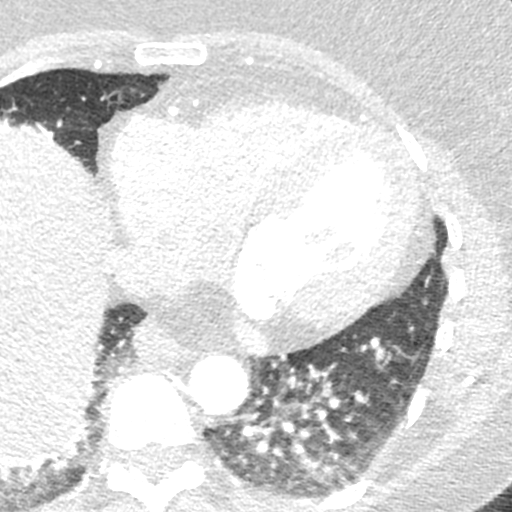
[im 204/306  lung]
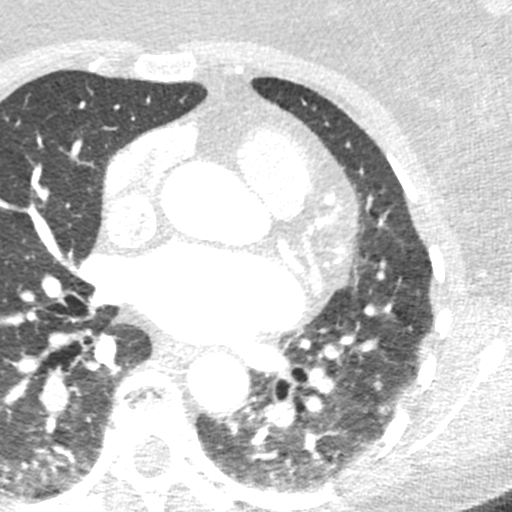

[8 of 20 positions shown; findings below may reference images not displayed]



Aorta: Normal size. Ascending aorta 2.6 cm. No calcifications. No
dissection.

Aortic Valve:  Trileaflet.  No calcifications.

Coronary Arteries:  Normal coronary origin.  Right dominance.

RCA is a large dominant artery that gives rise to PDA and PLVB.
There is no plaque.

Left main is a large artery that gives rise to LAD, RI, and LCX
arteries.

LAD is a large vessel that has no plaque.  D1 has no plaque.

RI has no plaque.

LCX is a non-dominant artery that gives rise to two OM branches
without plaque. There is no plaque.

Other findings:

Normal pulmonary vein drainage into the left atrium.

Normal let atrial appendage without a thrombus.

Normal size of the pulmonary artery.
IMPRESSION: 1. Coronary calcium score of 0. This was 0 percentile for age and
sex matched control.

2. Normal coronary origin with right dominance.

3. No evidence of CAD.  Consider non-cardiac causes of chest pain.

4. Over-read of the non cardiac structures by Radiology is pending.

EXAM:
OVER-READ INTERPRETATION  CT CHEST

The following report is an over-read performed by radiologist Dr.
does not include interpretation of cardiac or coronary anatomy or
pathology. The coronary calcium score/coronary CTA interpretation by
the cardiologist is attached.
FINDINGS: The visualized portions of the lower lung fields show no suspicious
nodules, masses, or infiltrates. No pleural fluid seen.

The visualized portions of the mediastinum and chest wall are
unremarkable. A small hiatal hernia is noted.
IMPRESSION: Small hiatal hernia. No other significant non-cardiovascular
abnormality in visualized portion of the thorax.

*** End of Addendum ***
FINDINGS: A 120 kV prospective scan was triggered in the descending thoracic
aorta at 111 HU's. Axial non-contrast 3 mm slices were carried out
through the heart. The data set was analyzed on a dedicated work
station and scored using the Agatson method. Gantry rotation speed
was 250 msecs and collimation was .6 mm. No beta blockade and 0.8 mg
of sl NTG was given. The 3D data set was reconstructed in 5%
intervals of the 67-82 % of the R-R cycle. Diastolic phases were
analyzed on a dedicated work station using MPR, MIP and VRT modes.
The patient received 80 cc of contrast.

Aorta: Normal size. Ascending aorta 2.6 cm. No calcifications. No
dissection.

Aortic Valve:  Trileaflet.  No calcifications.

Coronary Arteries:  Normal coronary origin.  Right dominance.

RCA is a large dominant artery that gives rise to PDA and PLVB.
There is no plaque.

Left main is a large artery that gives rise to LAD, RI, and LCX
arteries.

LAD is a large vessel that has no plaque.  D1 has no plaque.

RI has no plaque.

LCX is a non-dominant artery that gives rise to two OM branches
without plaque. There is no plaque.

Other findings:

Normal pulmonary vein drainage into the left atrium.

Normal let atrial appendage without a thrombus.

Normal size of the pulmonary artery.
IMPRESSION: 1. Coronary calcium score of 0. This was 0 percentile for age and
sex matched control.

2. Normal coronary origin with right dominance.

3. No evidence of CAD.  Consider non-cardiac causes of chest pain.

4. Over-read of the non cardiac structures by Radiology is pending.

## 2020-07-13 MED ORDER — METOPROLOL TARTRATE 5 MG/5ML IV SOLN
INTRAVENOUS | Status: AC
  Administered 2020-07-13: 5 mg via INTRAVENOUS
  Filled 2020-07-13: qty 15

## 2020-07-13 MED ORDER — IOHEXOL 350 MG/ML SOLN
80.0000 mL | Freq: Once | INTRAVENOUS | Status: AC | PRN
  Administered 2020-07-13: 80 mL via INTRAVENOUS

## 2020-07-13 MED ORDER — NITROGLYCERIN 0.4 MG SL SUBL
SUBLINGUAL_TABLET | SUBLINGUAL | Status: AC
  Filled 2020-07-13: qty 2

## 2020-07-13 MED ORDER — METOPROLOL TARTRATE 5 MG/5ML IV SOLN
5.0000 mg | INTRAVENOUS | Status: DC | PRN
  Administered 2020-07-13: 5 mg via INTRAVENOUS

## 2020-07-13 MED ORDER — NITROGLYCERIN 0.4 MG SL SUBL
0.8000 mg | SUBLINGUAL_TABLET | Freq: Once | SUBLINGUAL | Status: AC
  Administered 2020-07-13: 0.8 mg via SUBLINGUAL

## 2020-07-13 NOTE — Research (Signed)
IDENTIFY Informed Consent                  Subject Name: Valerie [name]   Subject met inclusion and exclusion criteria.  The informed consent form, study requirements and expectations were reviewed with the subject and questions and concerns were addressed prior to the signing of the consent form.  The subject verbalized understanding of the trial requirements.  The subject agreed to participate in the IDENTIFY trial and signed the informed consent at 14:19PM on 07/13/20.  The informed consent was obtained prior to performance of any protocol-specific procedures for the subject.  A copy of the signed informed consent was given to the subject and a copy was placed in the subject's medical record.   Meade Maw , Naval architect

## 2020-07-16 NOTE — Telephone Encounter (Signed)
Received fax from [facility] that the denial was overturned and approved for 1 yr. [place] case #[id number]. Gave approval information to infusion suite to process for pt/get pt scheduled.

## 2020-07-30 ENCOUNTER — Ambulatory Visit: Payer: Commercial Managed Care - PPO | Admitting: Neurology

## 2020-08-21 ENCOUNTER — Other Ambulatory Visit: Payer: Self-pay | Admitting: Nurse Practitioner

## 2020-08-21 DIAGNOSIS — Z1231 Encounter for screening mammogram for malignant neoplasm of breast: Secondary | ICD-10-CM

## 2020-08-29 ENCOUNTER — Other Ambulatory Visit: Payer: Self-pay | Admitting: Neurology

## 2020-08-29 MED ORDER — PREDNISONE 5 MG PO TABS
ORAL_TABLET | ORAL | 3 refills | Status: DC
Start: 2020-08-29 — End: 2021-10-08

## 2020-09-11 ENCOUNTER — Other Ambulatory Visit: Payer: Self-pay

## 2020-09-11 ENCOUNTER — Ambulatory Visit
Admission: RE | Admit: 2020-09-11 | Discharge: 2020-09-11 | Disposition: A | Discharge status: Home / Self Care | Payer: Commercial Managed Care - PPO | Source: Ambulatory Visit | Attending: Nurse Practitioner | Admitting: Nurse Practitioner

## 2020-09-11 DIAGNOSIS — Z1231 Encounter for screening mammogram for malignant neoplasm of breast: Secondary | ICD-10-CM

## 2020-09-11 IMAGING — MG MM DIGITAL SCREENING BILAT W/ TOMO AND CAD
6 of 10 series · 6 of 30 positions shown · non-contrast
Comparison: Previous exam(s).

CLINICAL DATA: Screening.

EXAM:
DIGITAL SCREENING BILATERAL MAMMOGRAM WITH TOMOSYNTHESIS AND CAD
TECHNIQUE: Bilateral screening digital craniocaudal and mediolateral oblique
mammograms were obtained. Bilateral screening digital breast
tomosynthesis was performed. The images were evaluated with
computer-aided detection.

[L MLO synth-2D]
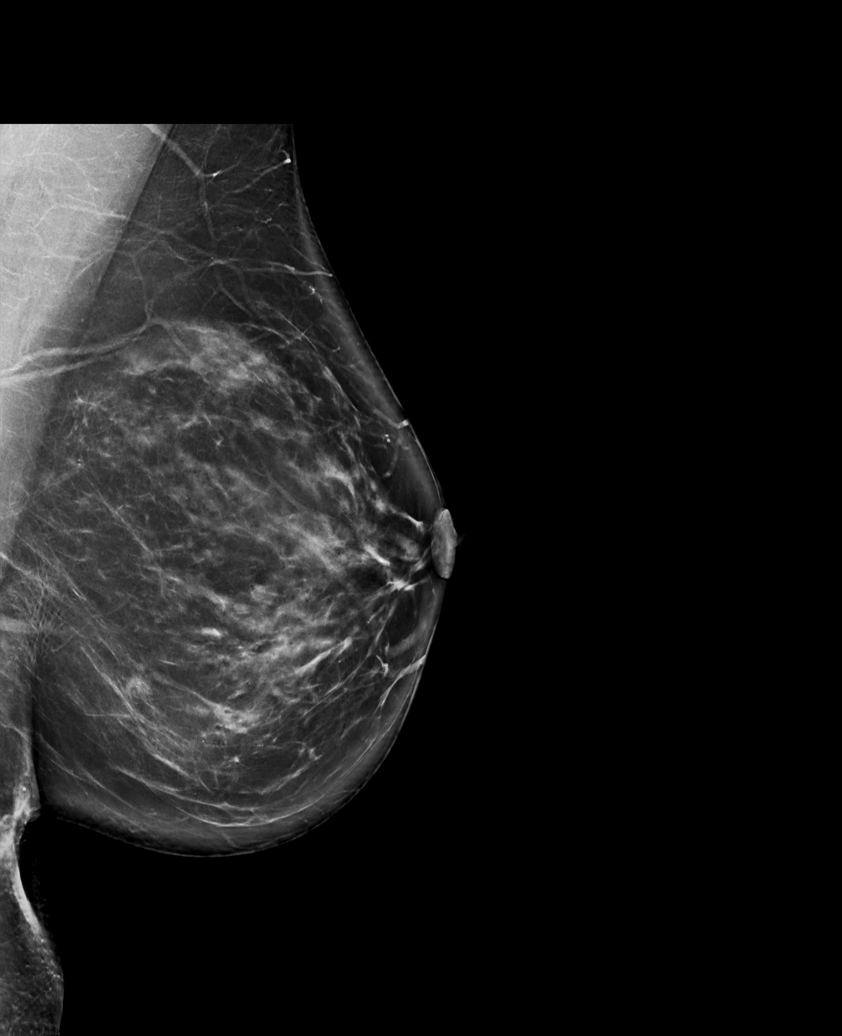

[R MLO synth-2D]
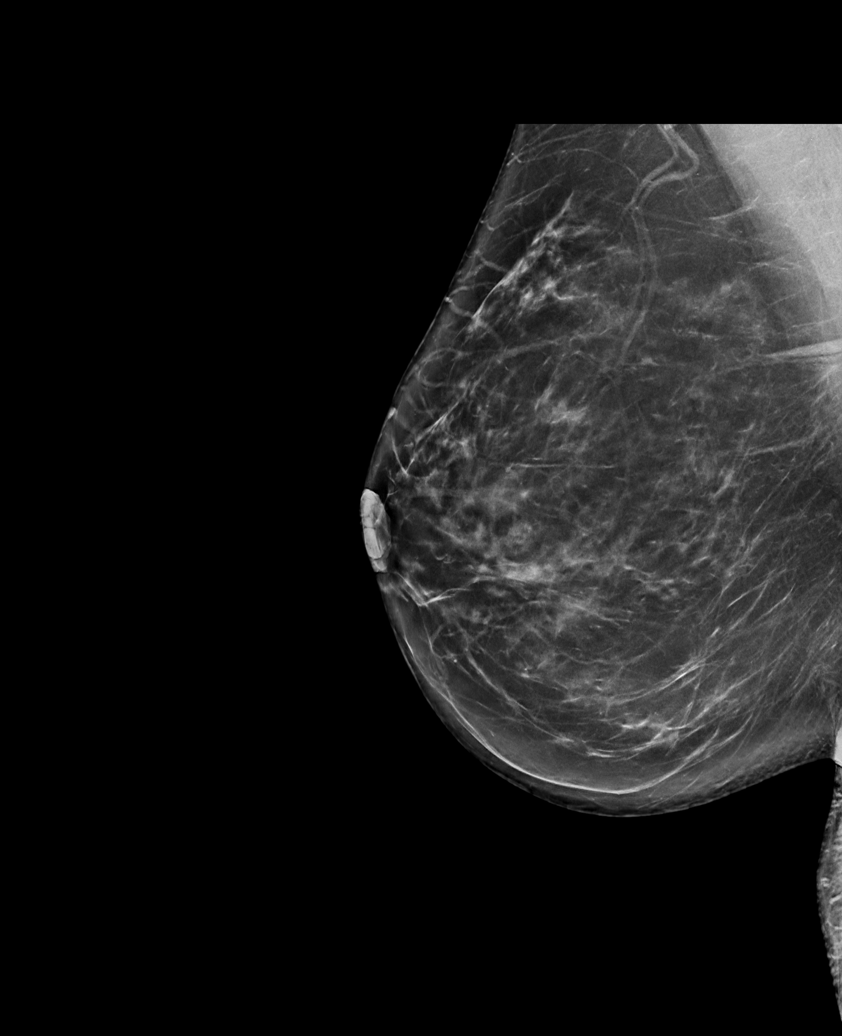

[R CC synth-2D]
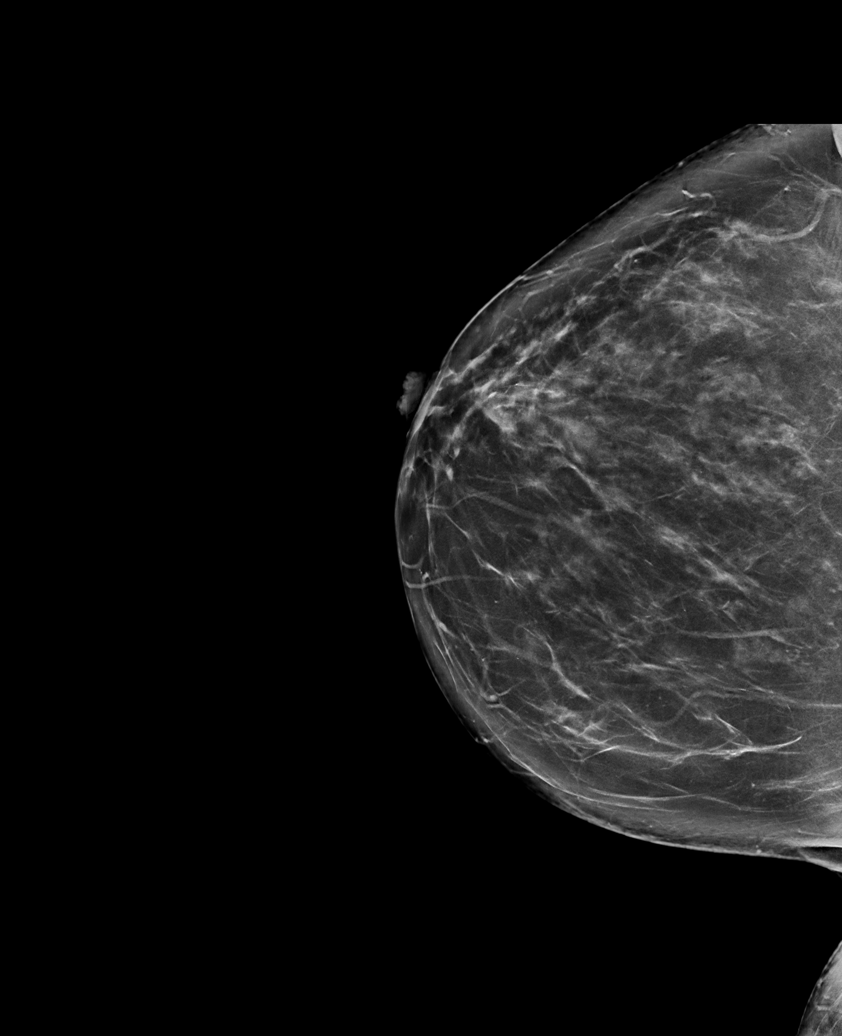

[L CC synth-2D (1 of 2)]
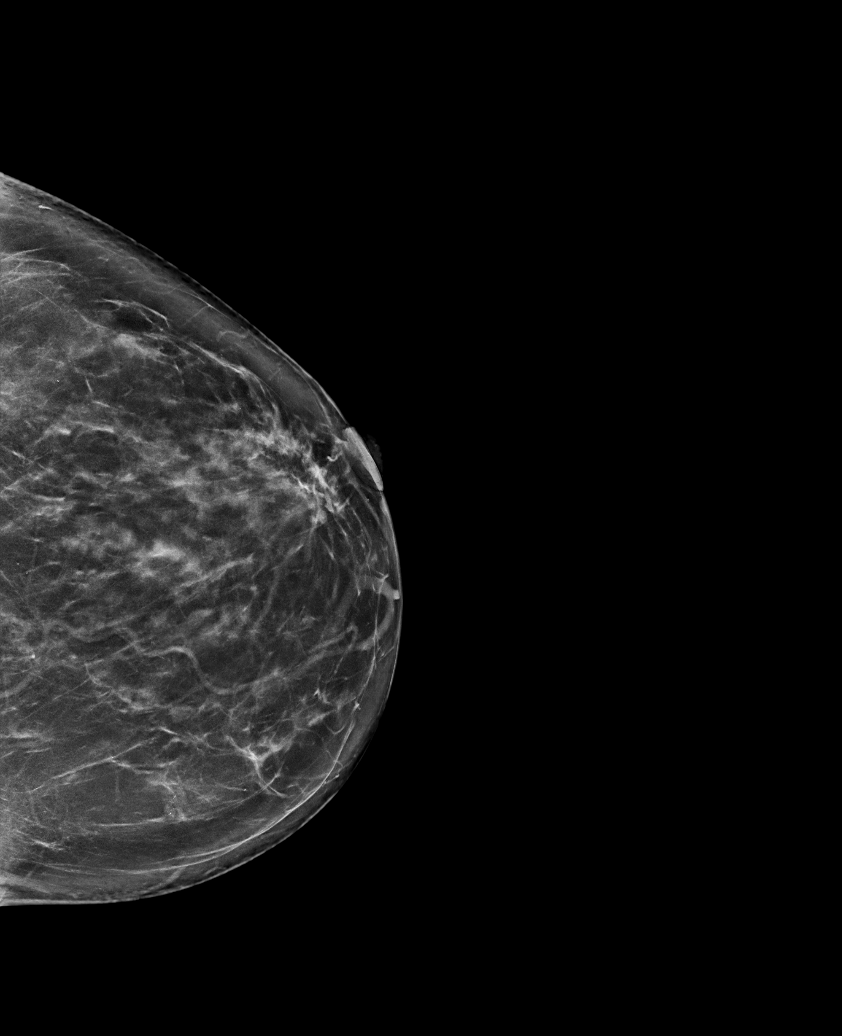

[L CC synth-2D (2 of 2)]
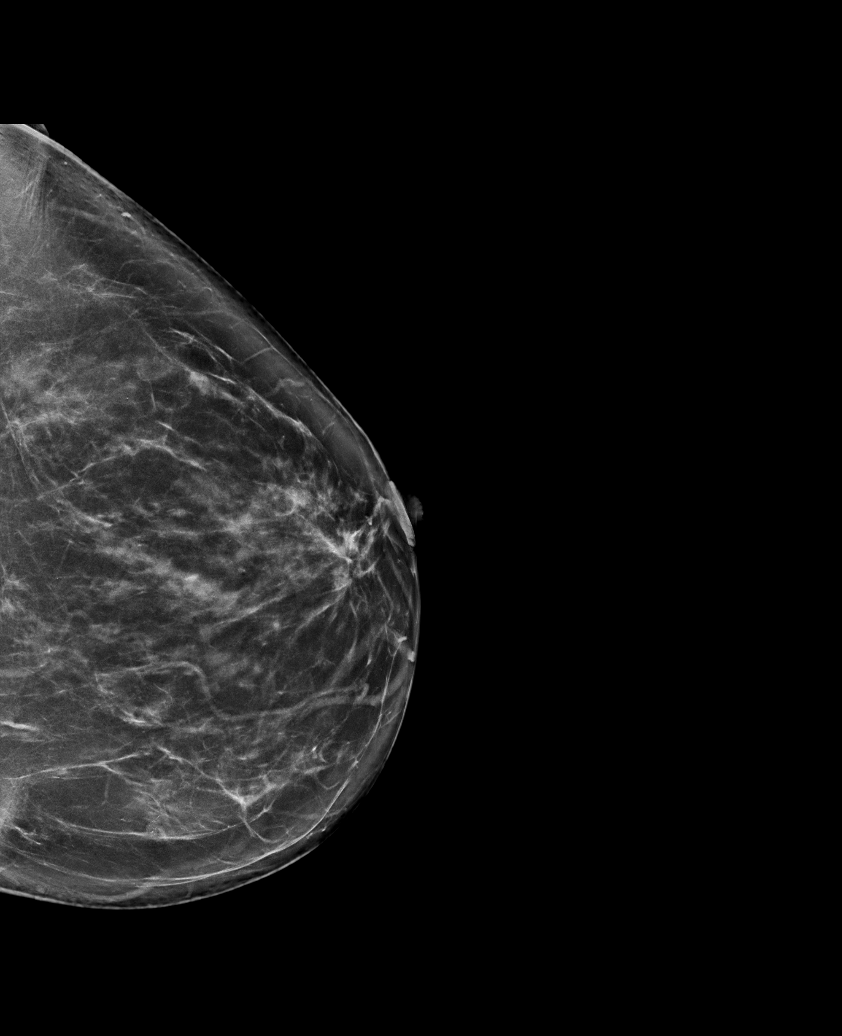

[L CC tomo · tomo slice 42/83.0]
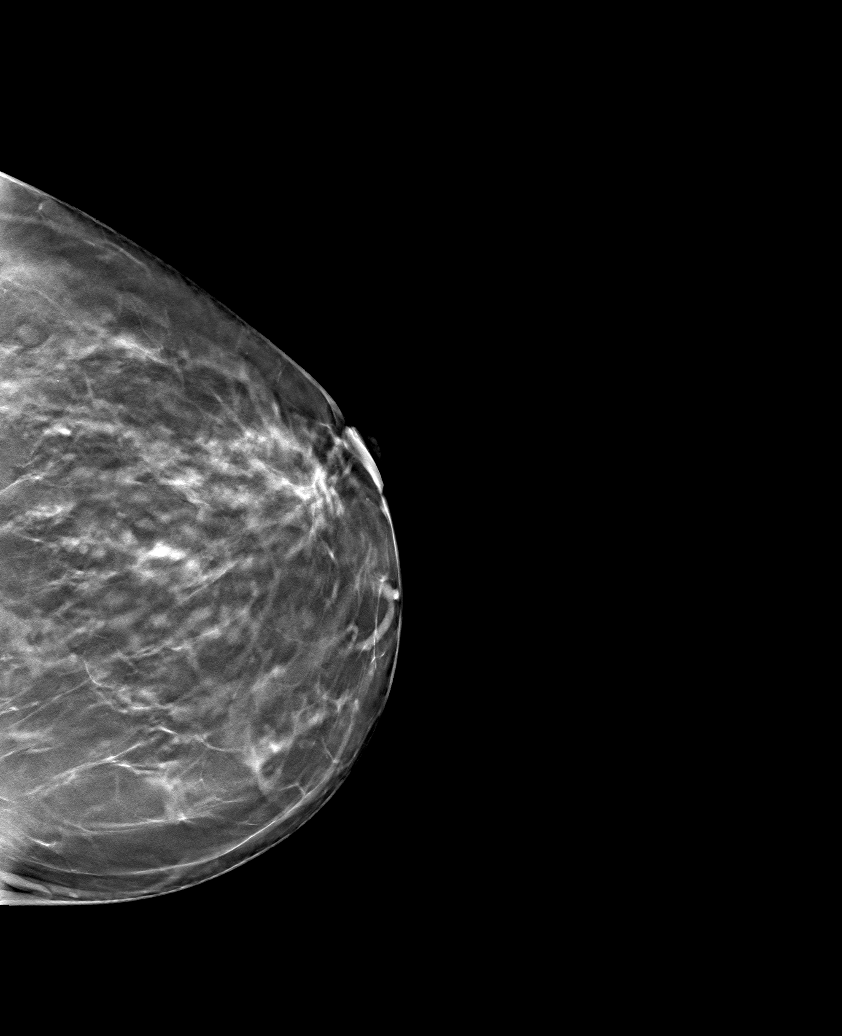

[6 of 30 positions shown; findings below may reference images not displayed]

ACR Breast Density Category c: The breast tissue is heterogeneously
dense, which may obscure small masses.
FINDINGS: In the left breast, possible distortion with calcifications warrants
further evaluation. This possible distortion with calcifications is
seen within the inner LEFT breast, cc [DATE] slice 52 and cc [DATE] slice
55, with probable correlate on the MLO view within the inferior LEFT
breast.

In the right breast, no findings suspicious for malignancy.
IMPRESSION: Further evaluation is suggested for possible distortion with
calcifications in the left breast.

RECOMMENDATION:
Diagnostic mammogram and possibly ultrasound of the left breast.
(Code:[[place]])

The patient will be contacted regarding the findings, and additional
imaging will be scheduled.

BI-RADS CATEGORY  0: Incomplete. Need additional imaging evaluation
and/or prior mammograms for comparison.

## 2020-09-12 DIAGNOSIS — C801 Malignant (primary) neoplasm, unspecified: Secondary | ICD-10-CM

## 2020-09-12 HISTORY — DX: Malignant (primary) neoplasm, unspecified: C80.1

## 2020-09-14 ENCOUNTER — Other Ambulatory Visit: Payer: Self-pay | Admitting: Nurse Practitioner

## 2020-09-14 DIAGNOSIS — R928 Other abnormal and inconclusive findings on diagnostic imaging of breast: Secondary | ICD-10-CM

## 2020-10-03 ENCOUNTER — Ambulatory Visit
Admission: RE | Admit: 2020-10-03 | Discharge: 2020-10-03 | Disposition: A | Discharge status: Home / Self Care | Payer: Commercial Managed Care - PPO | Source: Ambulatory Visit | Attending: Nurse Practitioner | Admitting: Nurse Practitioner

## 2020-10-03 ENCOUNTER — Other Ambulatory Visit: Payer: Self-pay

## 2020-10-03 ENCOUNTER — Other Ambulatory Visit: Payer: Self-pay | Admitting: Nurse Practitioner

## 2020-10-03 DIAGNOSIS — R928 Other abnormal and inconclusive findings on diagnostic imaging of breast: Secondary | ICD-10-CM

## 2020-10-03 IMAGING — US US BREAST*L* LIMITED INC AXILLA
1 series · 6 of 6 positions shown · non-contrast
Comparison: Previous exam(s).

CLINICAL DATA: 49-year-old female for further evaluation of
possible LEFT distortion with calcifications

EXAM:
DIGITAL DIAGNOSTIC UNILATERAL LEFT MAMMOGRAM WITH TOMOSYNTHESIS AND
CAD; ULTRASOUND LEFT BREAST LIMITED
TECHNIQUE: Left digital diagnostic mammography and breast tomosynthesis was
performed. The images were evaluated with computer-aided detection.;
Targeted ultrasound examination of the left breast was performed

[Series 1: us breast*left* limited inc axilla · 0.09mm/px · 6 of 6 slices shown]
[im 1/6]
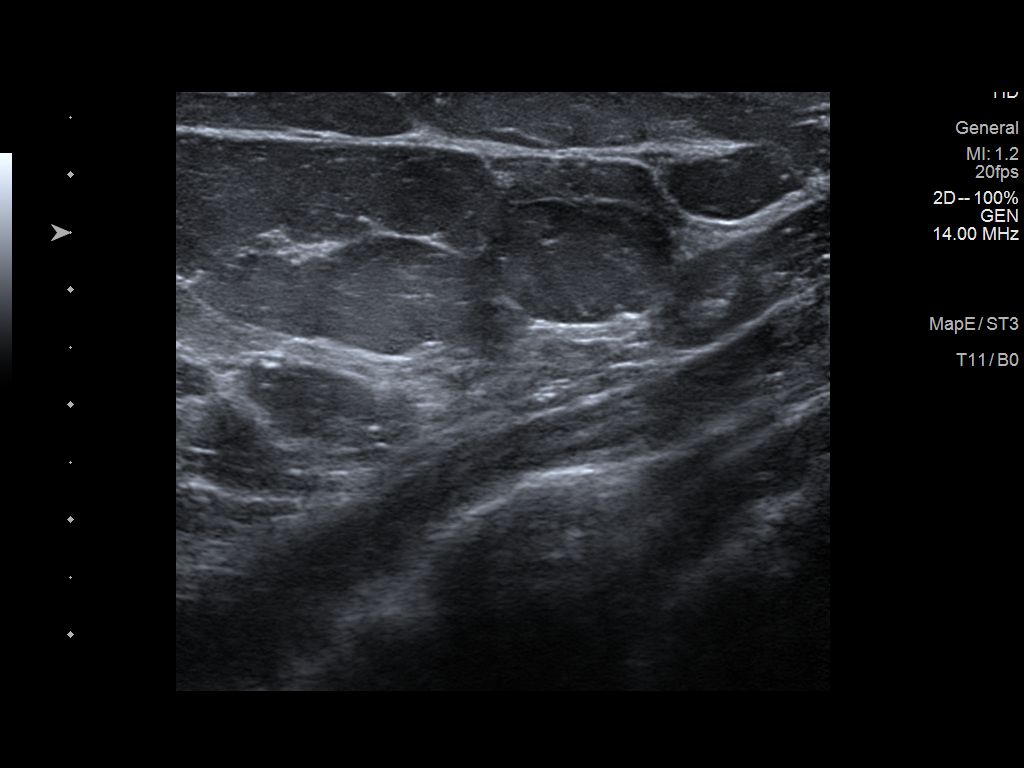
[im 2/6]
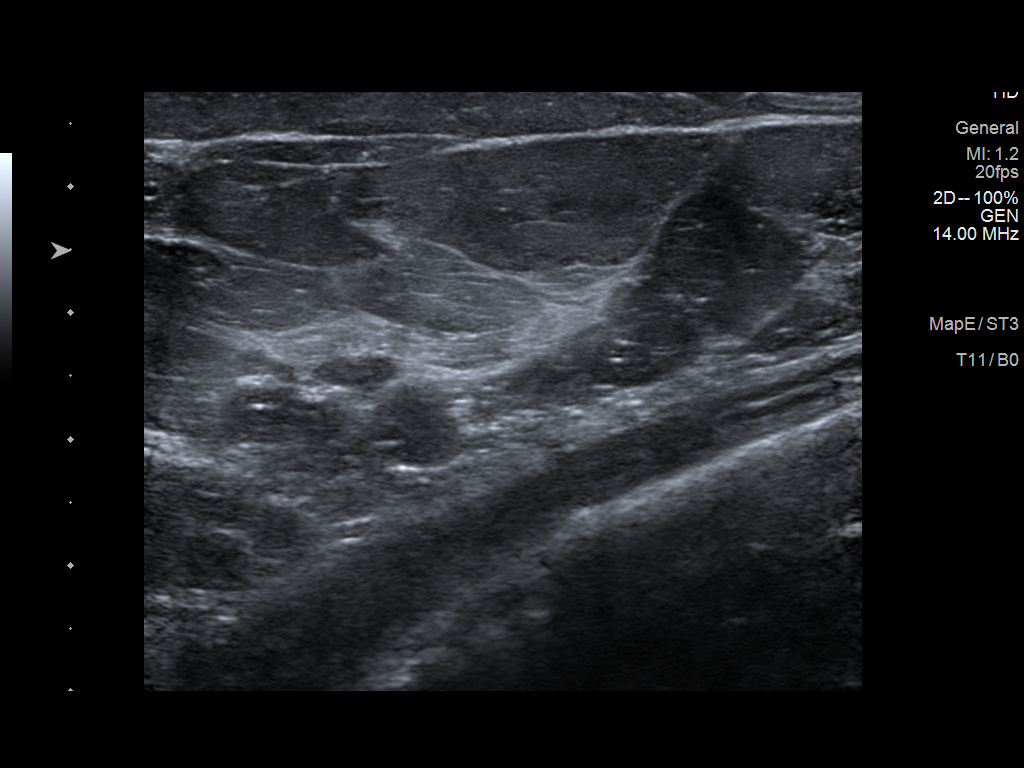
[im 3/6]
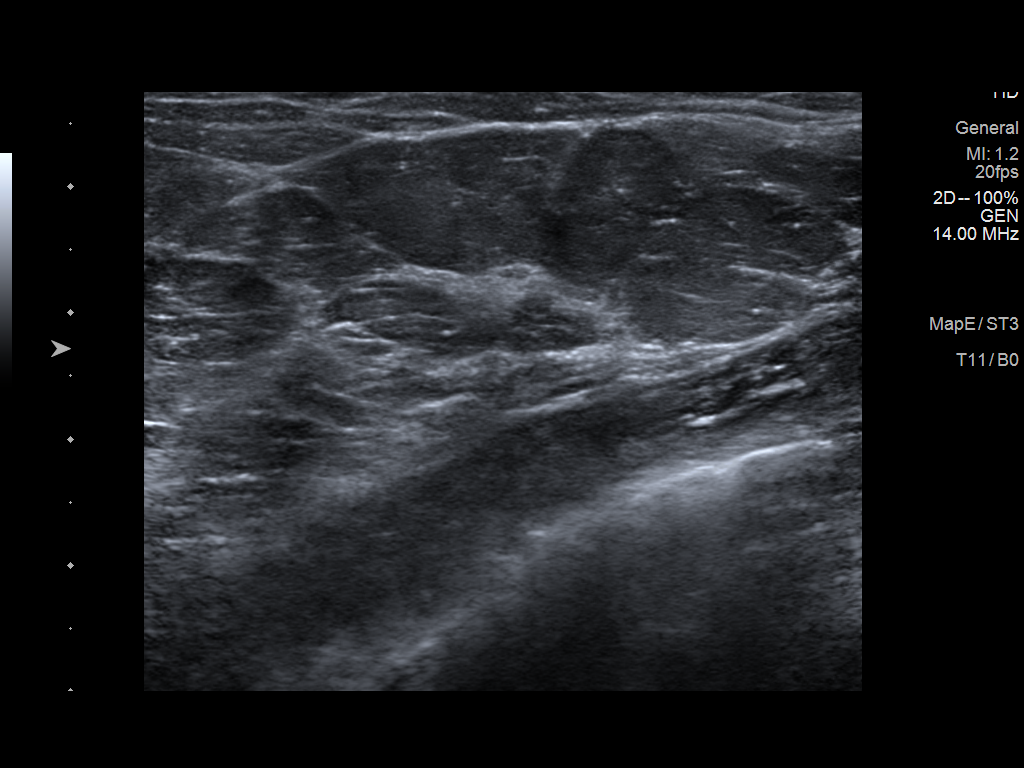
[im 4/6]
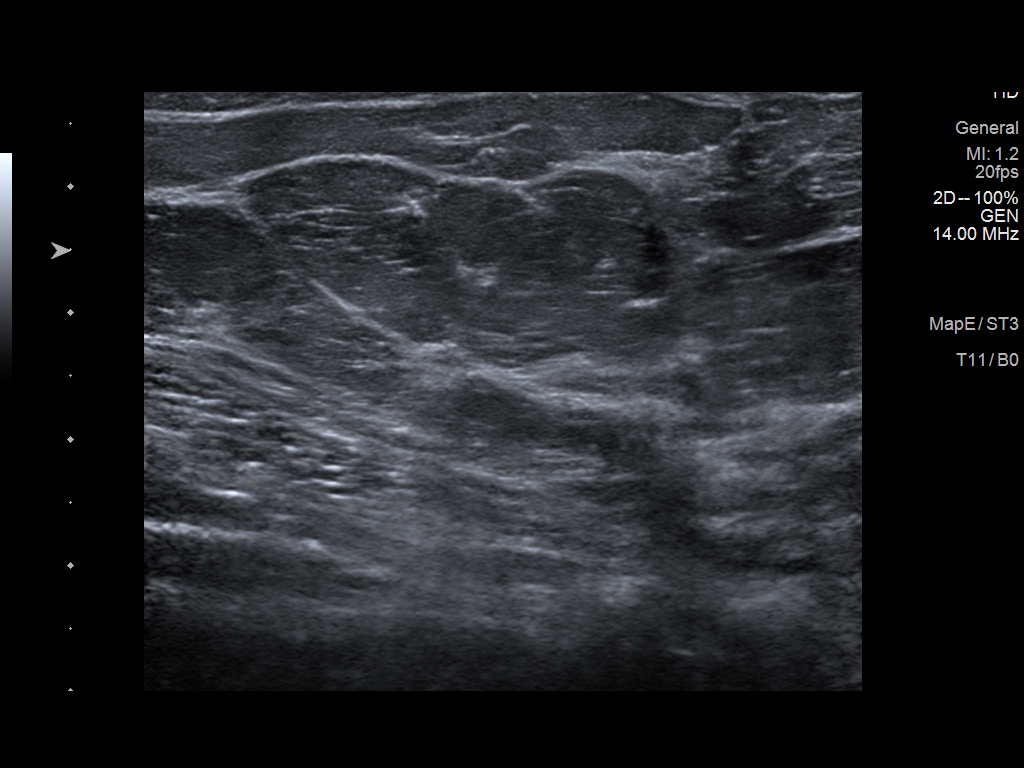
[im 5/6]
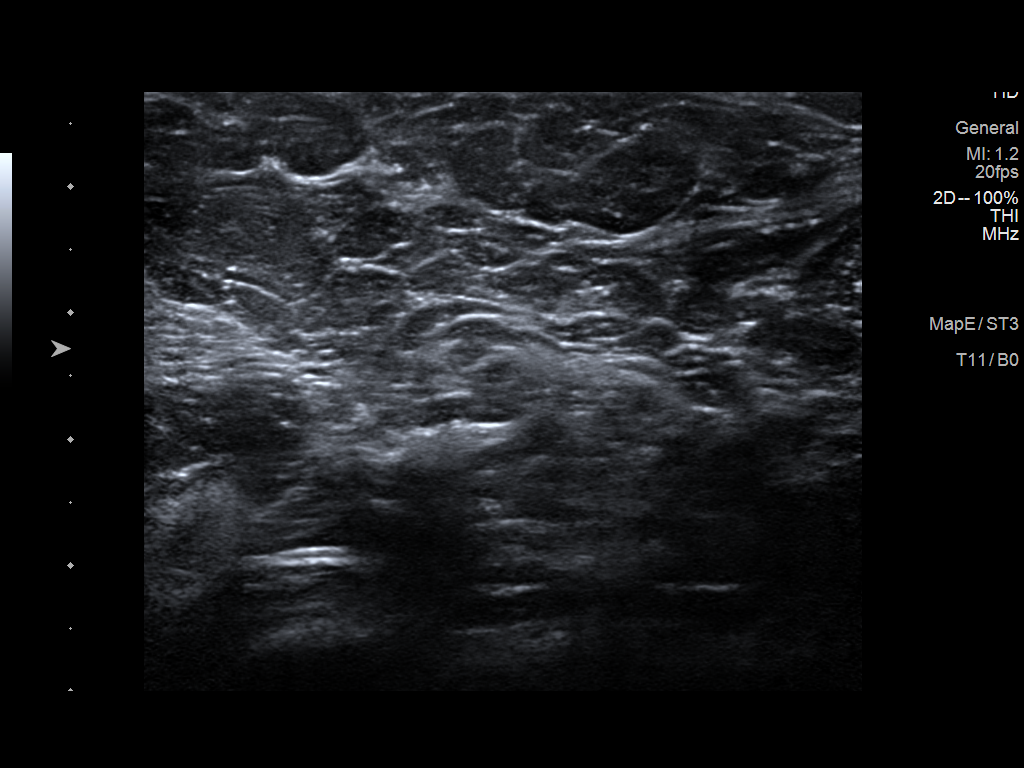
[im 6/6]
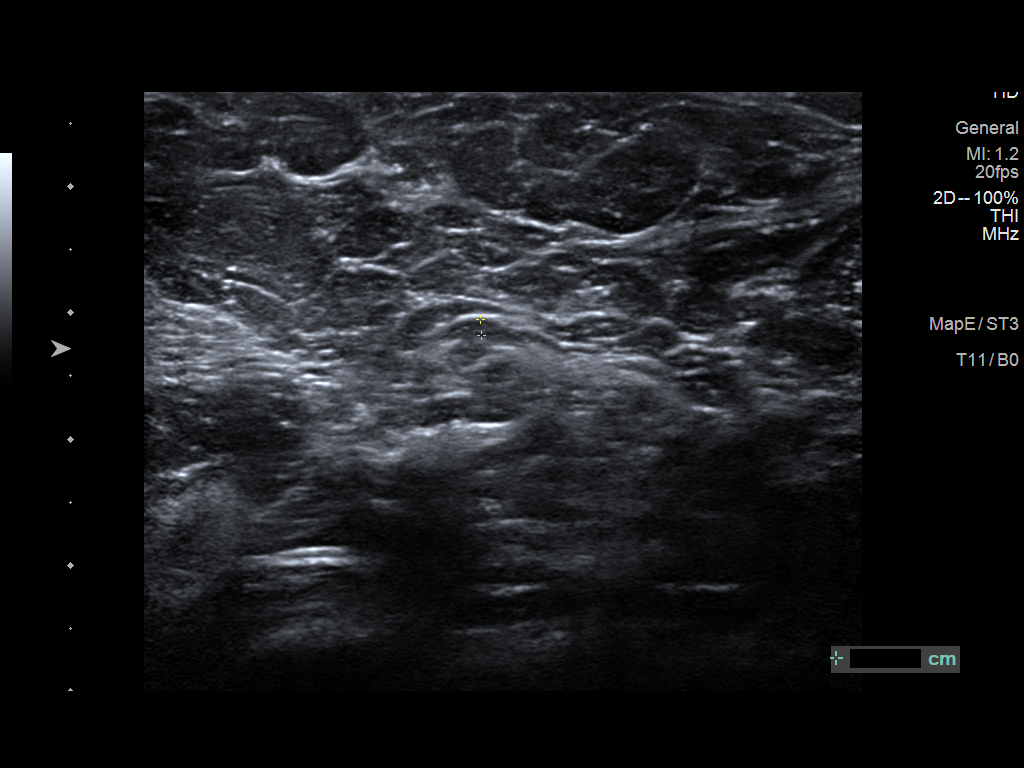

[6 of 6 positions shown; findings below may reference images not displayed]

ACR Breast Density Category c: The breast tissue is heterogeneously
dense, which may obscure small masses.
FINDINGS: Full field, magnification and spot compression views of the LEFT
breast demonstrate a 0.6 cm group of primarily round calcifications
with possible associated subtle distortion within the LOWER INNER
LEFT breast, middle depth. Some of these calcifications are arranged
in a linear orientation.

Targeted ultrasound is performed, showing no sonographic abnormality
within the LOWER INNER LEFT breast, in the area of
calcifications/possible subtle distortion.

No abnormal LEFT axillary lymph nodes are noted.
IMPRESSION: 1. 0.6 cm group of indeterminate LOWER INNER LEFT breast
calcifications with possible associated subtle distortion. Tissue
sampling is recommended.
2. No abnormal appearing LEFT axillary lymph nodes.

RECOMMENDATION:
3D/stereotactic guided LEFT breast biopsy, which will be scheduled.

I have discussed the findings and recommendations with the patient.
If applicable, a reminder letter will be sent to the patient
regarding the next appointment.

BI-RADS CATEGORY  4: Suspicious.

## 2020-10-03 IMAGING — MG MM DIGITAL DIAGNOSTIC UNILAT*L* W/ TOMO W/ CAD
8 of 12 series · 8 of 28 positions shown · non-contrast
Comparison: Previous exam(s).

CLINICAL DATA: 49-year-old female for further evaluation of
possible LEFT distortion with calcifications

EXAM:
DIGITAL DIAGNOSTIC UNILATERAL LEFT MAMMOGRAM WITH TOMOSYNTHESIS AND
CAD; ULTRASOUND LEFT BREAST LIMITED
TECHNIQUE: Left digital diagnostic mammography and breast tomosynthesis was
performed. The images were evaluated with computer-aided detection.;
Targeted ultrasound examination of the left breast was performed

[L ML (1 of 3)]
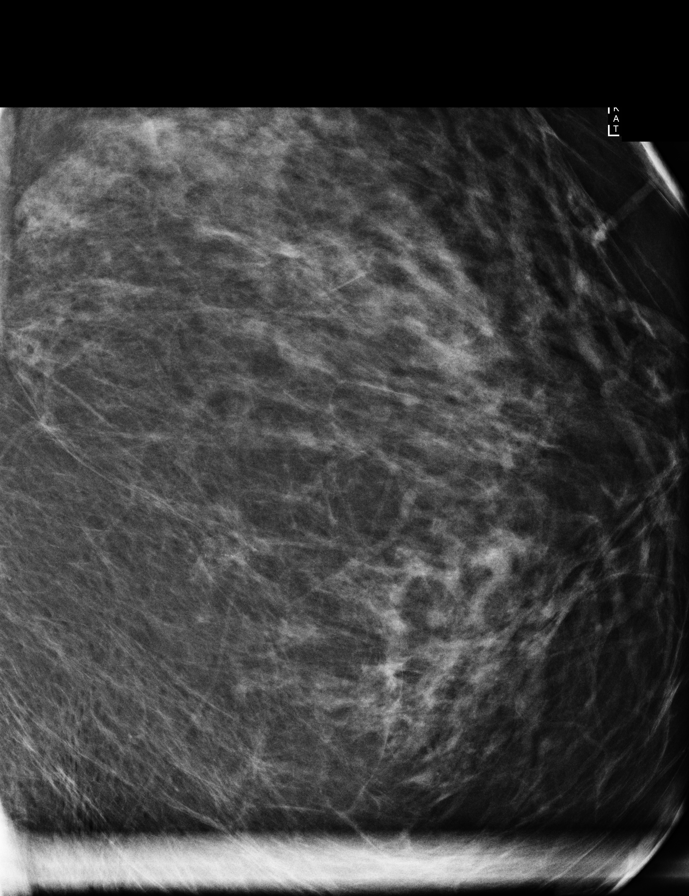

[L ML (2 of 3)]
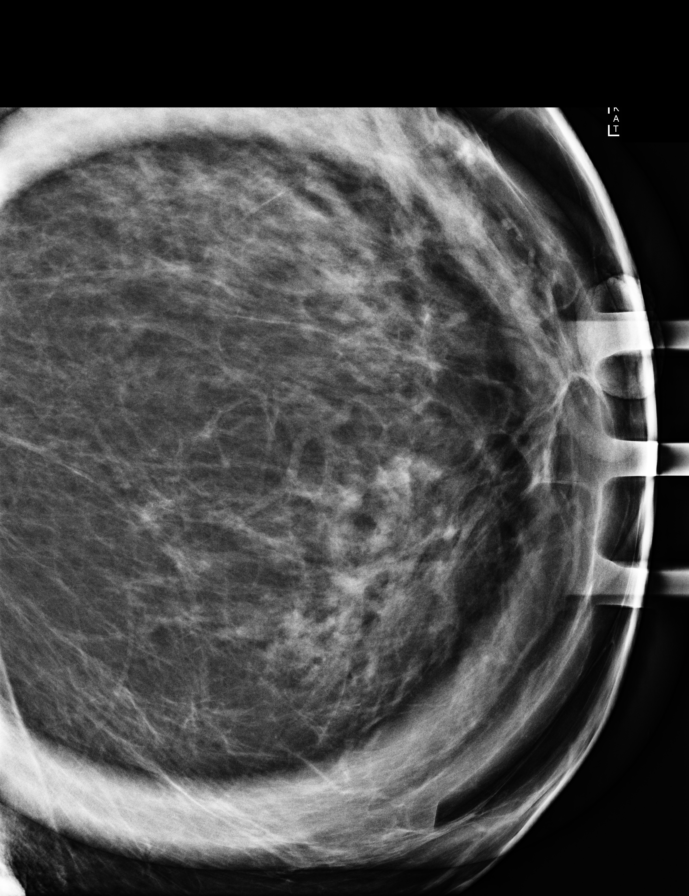

[L CC]
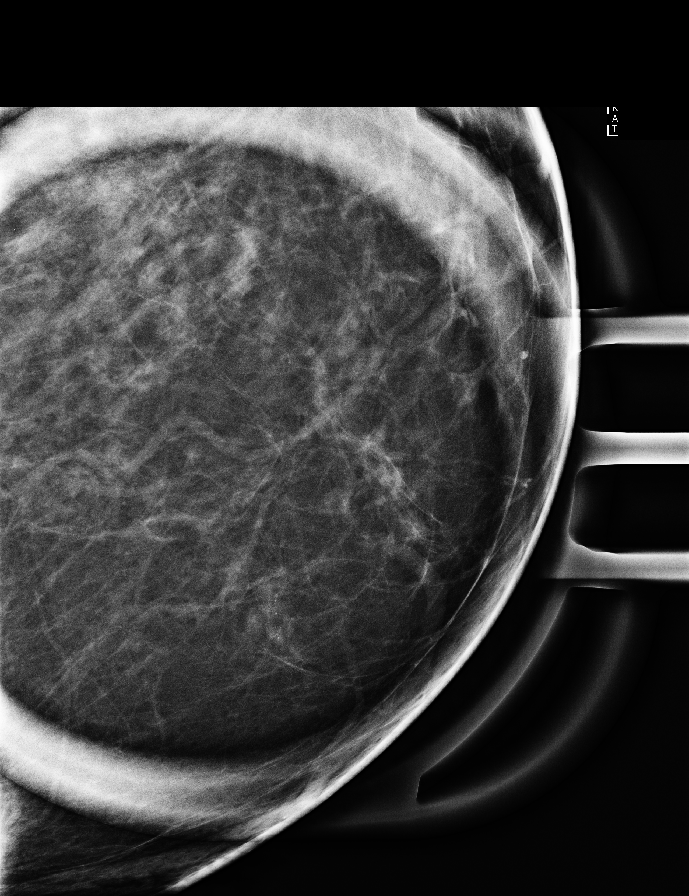

[L ML (3 of 3)]
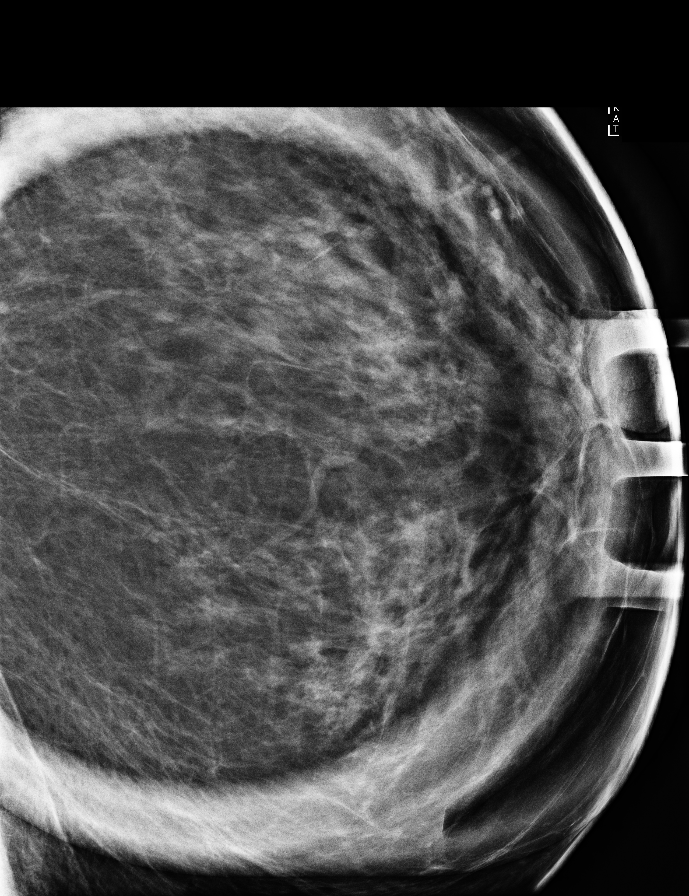

[L MLO synth-2D (1 of 2)]
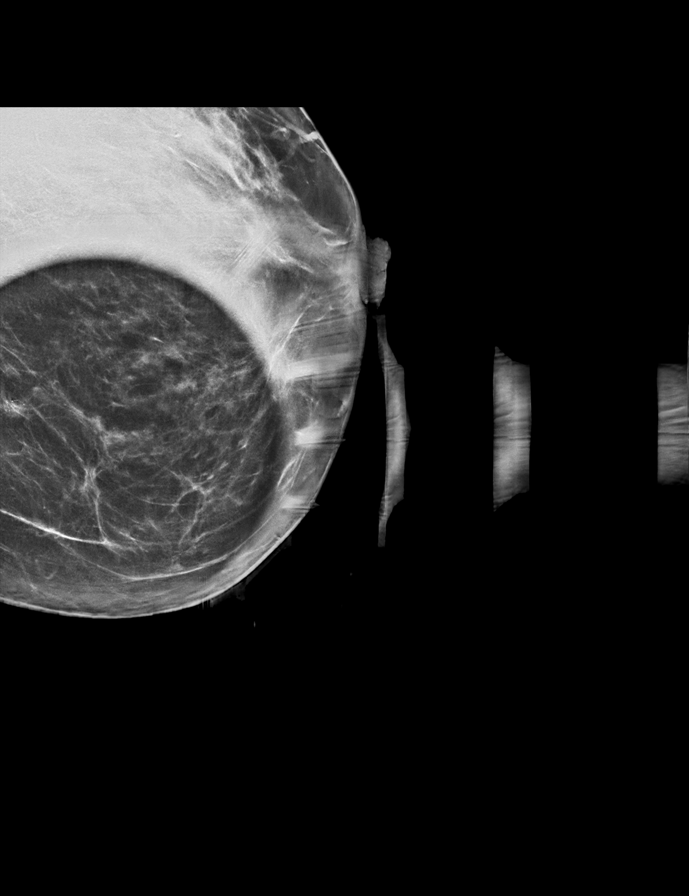

[L ML synth-2D]
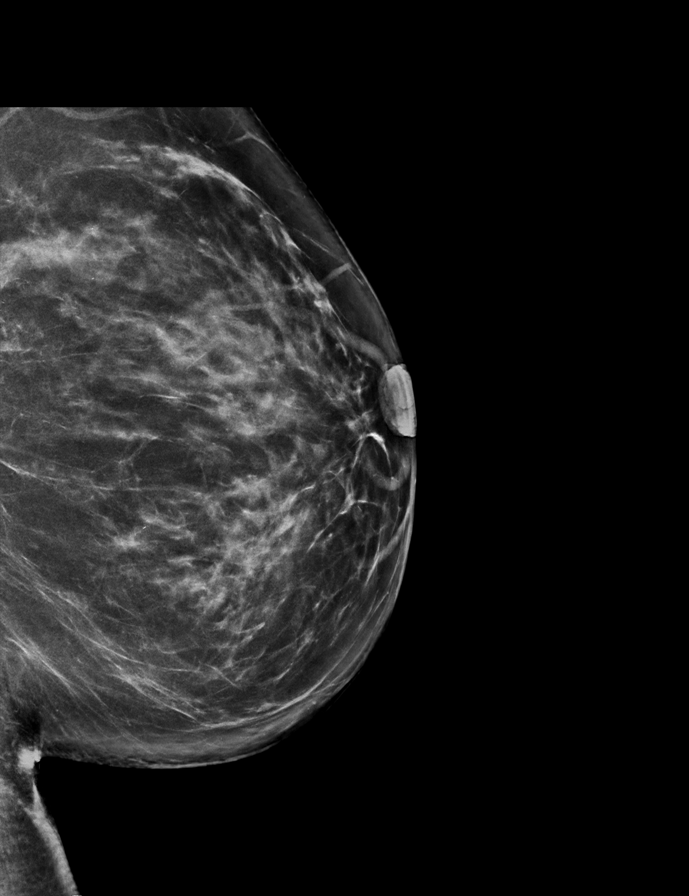

[L CC synth-2D]
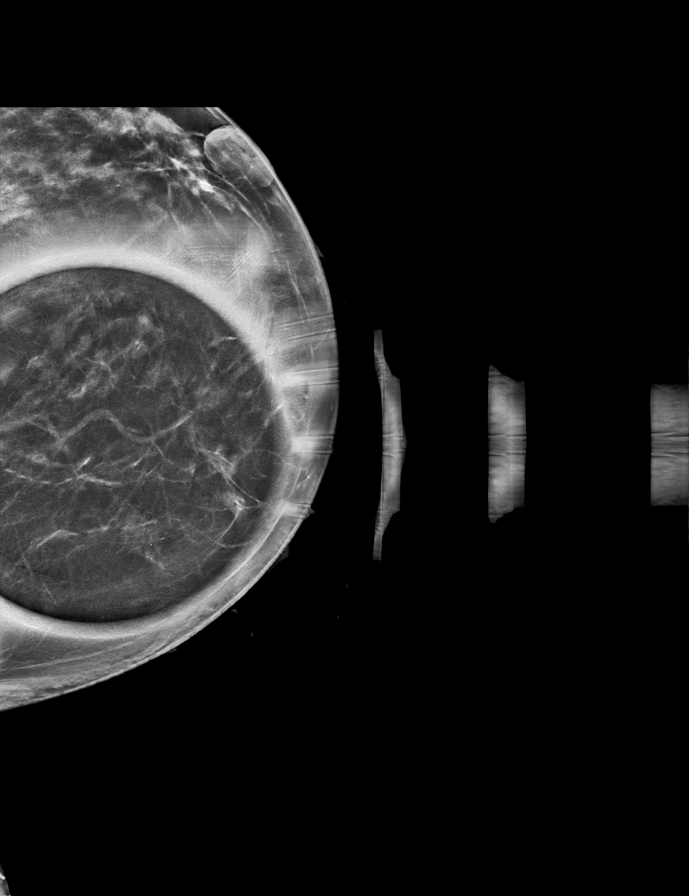

[L MLO synth-2D (2 of 2)]
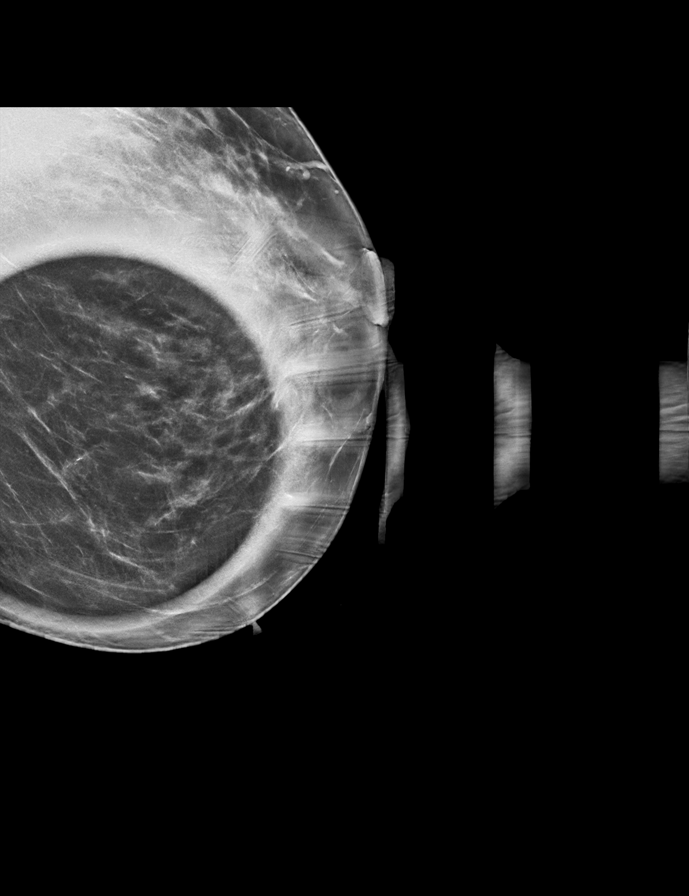

[8 of 28 positions shown; findings below may reference images not displayed]

ACR Breast Density Category c: The breast tissue is heterogeneously
dense, which may obscure small masses.
FINDINGS: Full field, magnification and spot compression views of the LEFT
breast demonstrate a 0.6 cm group of primarily round calcifications
with possible associated subtle distortion within the LOWER INNER
LEFT breast, middle depth. Some of these calcifications are arranged
in a linear orientation.

Targeted ultrasound is performed, showing no sonographic abnormality
within the LOWER INNER LEFT breast, in the area of
calcifications/possible subtle distortion.

No abnormal LEFT axillary lymph nodes are noted.
IMPRESSION: 1. 0.6 cm group of indeterminate LOWER INNER LEFT breast
calcifications with possible associated subtle distortion. Tissue
sampling is recommended.
2. No abnormal appearing LEFT axillary lymph nodes.

RECOMMENDATION:
3D/stereotactic guided LEFT breast biopsy, which will be scheduled.

I have discussed the findings and recommendations with the patient.
If applicable, a reminder letter will be sent to the patient
regarding the next appointment.

BI-RADS CATEGORY  4: Suspicious.

## 2020-10-12 HISTORY — PX: BREAST BIOPSY: SHX20

## 2020-10-16 DIAGNOSIS — Z1231 Encounter for screening mammogram for malignant neoplasm of breast: Secondary | ICD-10-CM

## 2020-10-17 ENCOUNTER — Ambulatory Visit
Admission: RE | Admit: 2020-10-17 | Discharge: 2020-10-17 | Disposition: A | Discharge status: Home / Self Care | Payer: Commercial Managed Care - PPO | Source: Ambulatory Visit | Attending: Nurse Practitioner | Admitting: Nurse Practitioner

## 2020-10-17 ENCOUNTER — Other Ambulatory Visit: Payer: Self-pay

## 2020-10-17 DIAGNOSIS — R928 Other abnormal and inconclusive findings on diagnostic imaging of breast: Secondary | ICD-10-CM

## 2020-10-17 HISTORY — PX: BIOPSY BREAST: PRO8

## 2020-10-17 IMAGING — MG MM BREAST BX W LOC DEV 1ST LESION IMAGE BX SPEC STEREO GUIDE*L*
7 of 13 series · 7 of 33 positions shown · non-contrast
Comparison: Previous exams.
COMPARISON: Previous exams.

Addendum:
CLINICAL DATA: 50-year-old female with indeterminate left breast
calcifications and questioned associated distortion.

EXAM:
LEFT BREAST STEREOTACTIC CORE NEEDLE BIOPSY

[L (1 of 7)]
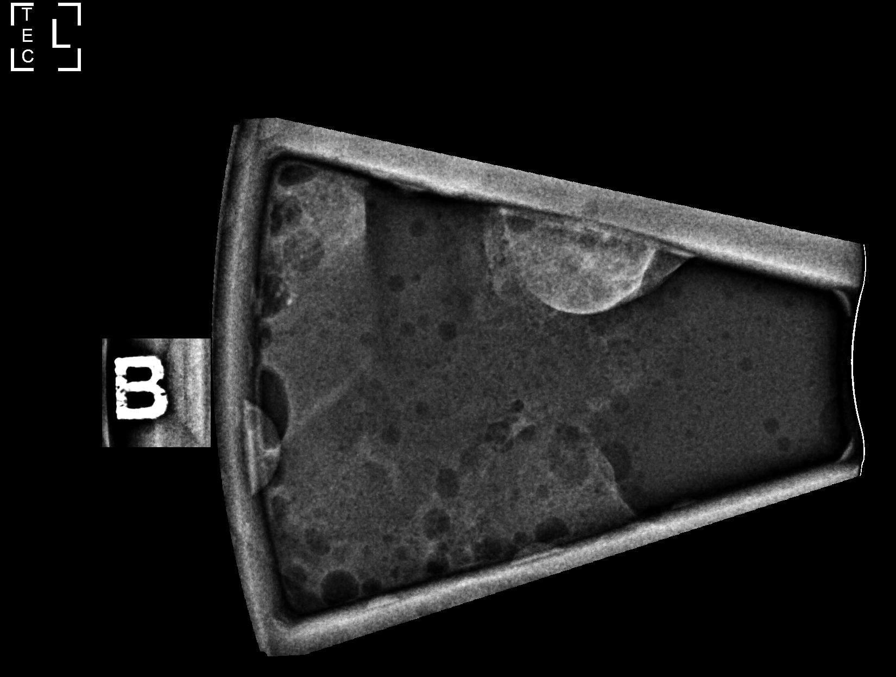

[L (2 of 7)]
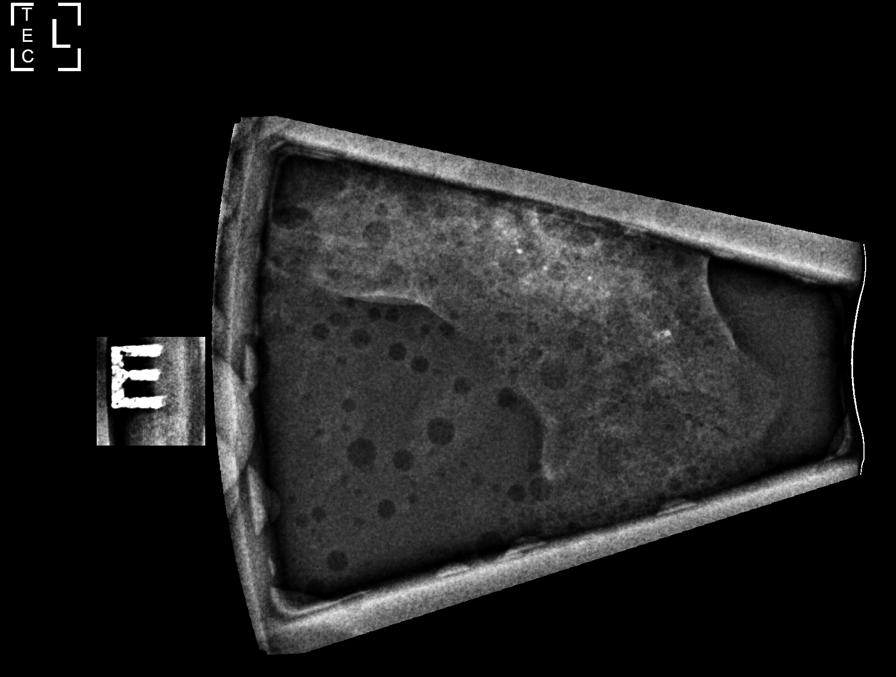

[L (3 of 7)]
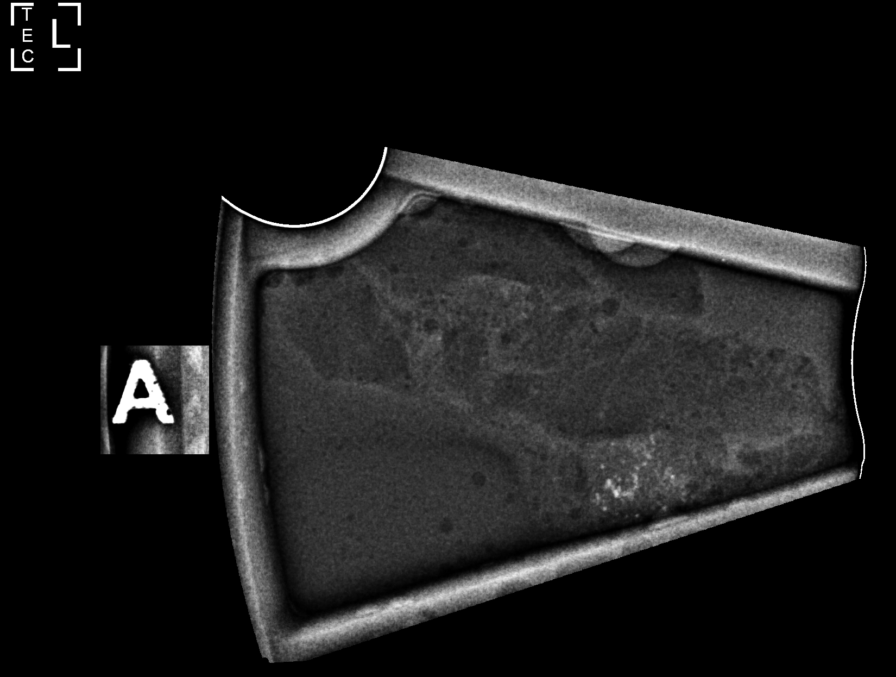

[L (4 of 7)]
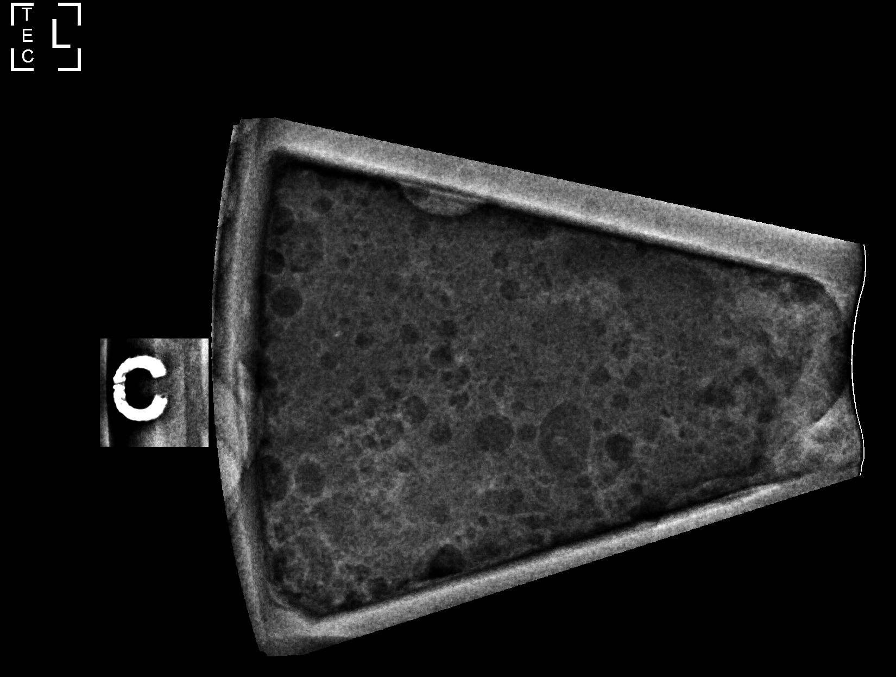

[L (5 of 7)]
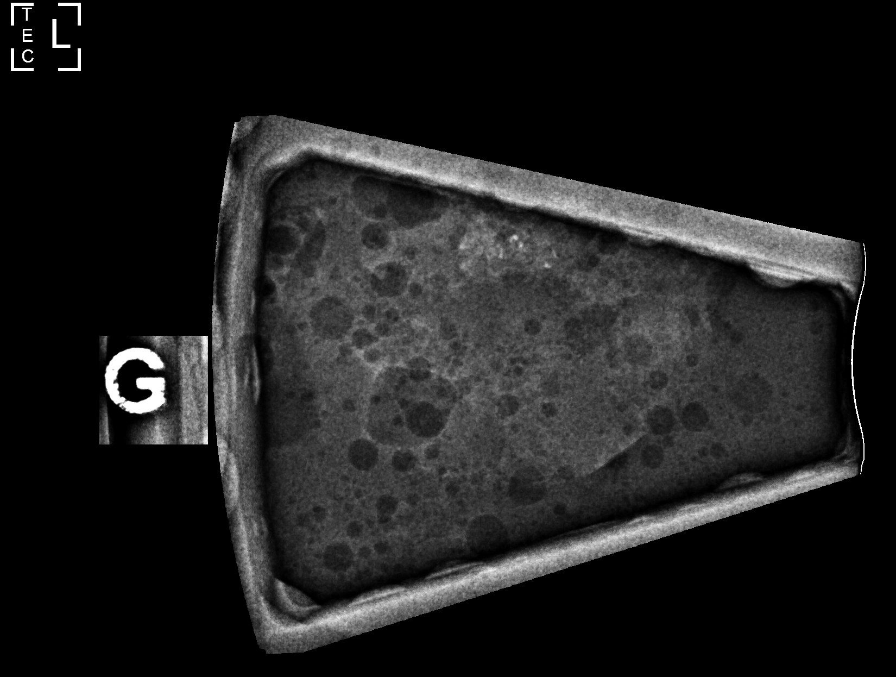

[L (6 of 7)]
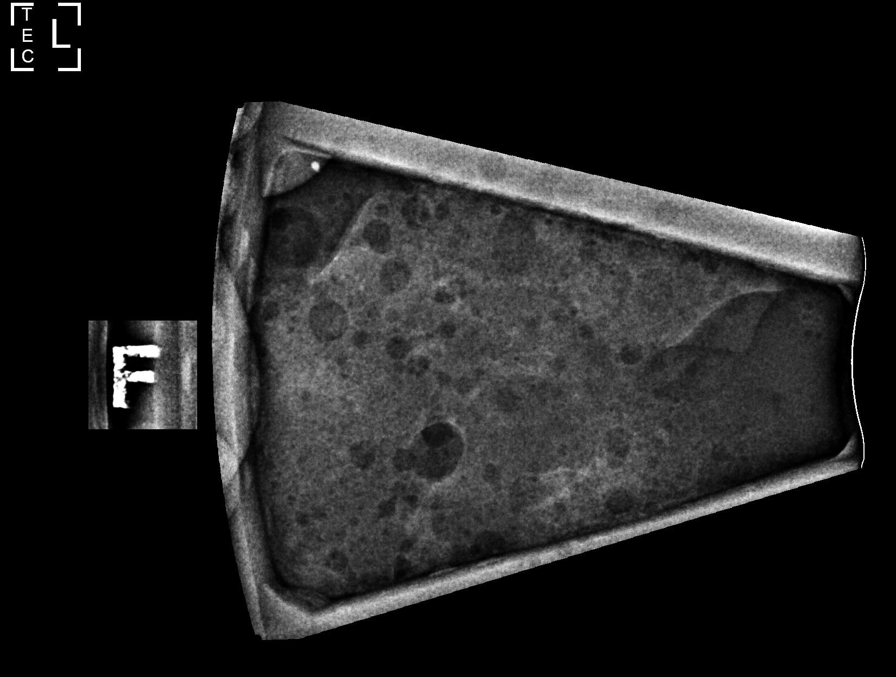

[L (7 of 7)]
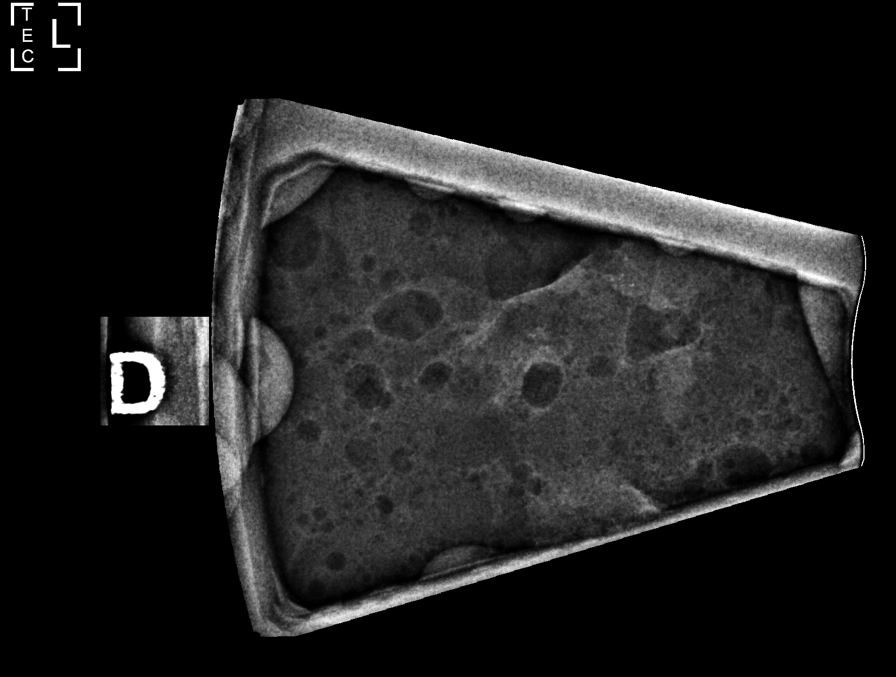

[7 of 33 positions shown; findings below may reference images not displayed]



Using sterile technique and 1% Lidocaine as local anesthetic, under
stereotactic guidance, a 9 gauge vacuum assisted device was used to
perform core needle biopsy of calcifications in the far medial left
breast using a medial approach. Specimen radiograph was performed
showing calcifications in multiple specimens. Specimens with
calcifications are identified for pathology.

Lesion quadrant: Lower inner quadrant

At the conclusion of the biopsy the patient had a vasovagal
reaction. She was taken out of compression and a post biopsy clip
could not be placed. All vital signs remained within normal range
during the recovery time. The patient recovered completely under
direct surveillance and was released from the department at her
baseline disposition.
IMPRESSION: Stereotactic-guided biopsy of the left breast, complicated by a
vasovagal reaction. The patient recovered fully under direct
surveillance and was released from the department at her baseline
disposition. A post biopsy clip was not placed at today's visit.

ADDENDUM:
Pathology revealed GRADE I INVASIVE DUCTAL CARCINOMA, DUCTAL
CARCINOMA IN SITU WITH CALCIFICATIONS of the LEFT breast, lower
inner quadrant, middle depth. This was found to be concordant by Dr.
MEIJIDE.

Pathology results were discussed with the patient by telephone. The
patient reported doing well after the biopsy with tenderness at the
site. Post biopsy instructions and care were reviewed and questions
were answered. The patient was encouraged to call The [REDACTED]

The patient was referred to [REDACTED]
[REDACTED] at [REDACTED] on
[DATE].

The procedure was complicated by the patient having a severe
vasovagal reaction. She had to be removed from compression and a
post biopsy clip could therefore not be placed. The patient was
asked to return for a LEFT diagnostic mammogram with magnification
views to evaluate for residual calcifications for localization
purposes. Further recommendations will be guided by these results.

Pathology results reported by MEIJIDE RN on [DATE].



Using sterile technique and 1% Lidocaine as local anesthetic, under
stereotactic guidance, a 9 gauge vacuum assisted device was used to
perform core needle biopsy of calcifications in the far medial left
breast using a medial approach. Specimen radiograph was performed
showing calcifications in multiple specimens. Specimens with
calcifications are identified for pathology.

Lesion quadrant: Lower inner quadrant

At the conclusion of the biopsy the patient had a vasovagal
reaction. She was taken out of compression and a post biopsy clip
could not be placed. All vital signs remained within normal range
during the recovery time. The patient recovered completely under
direct surveillance and was released from the department at her
baseline disposition.
IMPRESSION: Stereotactic-guided biopsy of the left breast, complicated by a
vasovagal reaction. The patient recovered fully under direct
surveillance and was released from the department at her baseline
disposition. A post biopsy clip was not placed at today's visit.

## 2020-10-18 ENCOUNTER — Encounter: Payer: Self-pay | Admitting: Neurology

## 2020-10-18 ENCOUNTER — Ambulatory Visit: Payer: Commercial Managed Care - PPO | Admitting: Neurology

## 2020-10-18 VITALS — BP 130/80 | HR 92 | Ht 66.0 in | Wt 181.0 lb

## 2020-10-18 DIAGNOSIS — Z79899 Other long term (current) drug therapy: Secondary | ICD-10-CM | POA: Diagnosis not present

## 2020-10-18 DIAGNOSIS — H469 Unspecified optic neuritis: Secondary | ICD-10-CM

## 2020-10-18 DIAGNOSIS — G378 Other specified demyelinating diseases of central nervous system: Secondary | ICD-10-CM

## 2020-10-18 NOTE — Progress Notes (Signed)
[facility]  PATIENT: Valerie [name] DOB: 1971-01-27  REFERRING DOCTOR OR PCP:  Dr. Curly Rim SOURCE: Patient, notes from recent hospitalization, laboratory results, MRI images personally reviewed.  _________________________________   HISTORICAL  CHIEF COMPLAINT:  Chief Complaint  Patient presents with   Follow-up    Rm 2, alone. Here for 3 month f/u, pt has just reduced her prednisone down to 15mg . Pt states she has been doing well. Did have a breast biopsy yesterday, still awaiting results.     HISTORY OF PRESENT ILLNESS:  Valerie [name], is a 50 y.o. woman with MOGAD and  optic neuritis   Update 10/18/2020: She is on prednisone 15 mg qod and is now on Rituxan.  She had the first Rituxan in April and next one will be in October.     She had trouble tolerating azathioprine.  She had some worsening in vision a few weeks ago and was temporarily on 20 mg prednisone.      She had two definite episodes of ON, the right December 2021 and the left 05/05/2020.     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   Colors are desaturated OS.   She notes she is dropping items more.   She denies any change in her gait or balance now but had some issues in January.   She notes no difficulty with strength or sensation.  Bladder function is fine.  Cognition is fine.  She had Covid 11/2019.   She did not do the vaccination.    She did a tox screen with her Wellness doctor .   The 2-hydroxyisobutyric acid was > 95%th percentile and monoethylphthalate and 2,4 dichlorophenoxyaceic acid were > 75th percentile.     She had calcifications on the left breast and had a biopsy.  HISTORY MOGAD / ON She  had right >> left eye pain at the end of December 2021.  On 04/20/2020 she began tp have reduced vision OD and photophobia.    She saw an ophthalmologist who felt she had dry eyes.   She saw Dr. Glenis Smoker the next day who diagnosed her  with right optic neuritis.  She was referred to Dr. Baird Cancer who did OCT c/with optic neuritis.   She was admittted 04/24/2020 to 04/29/2020 and received 5 days IV Solu-Medrol.   Vision improved with the steroids.   Upon discharge, she still had reduced visual acuity but good color vision.   She had only mild eye aching and mild photophobia.     She continued to be stable for the next few days.   On 05/05/2020, 2 days ago, she had aching in the left eye.      Imaging studies: MRI of the orbits, and brain 04/24/2020 showed evidence of right optic neuritis.  The brain was otherwise normal.  MRI of the cervical and thoracic spine 04/24/2020 showed no demyelination.  She does have some mild degenerative changes in the cervical spine.  Data review: Imaging: MRI of the brain 04/24/2020 was normal.  MRI of the orbits 04/24/2020 showed abnormal signal with enhancement in the right optic nerve consistent with optic neuritis.  MRI of the cervical spine and MRI of the thoracic spine 04/24/2020 showed normal spinal cord and some degenerative changes with moderate right foraminal narrowing at C5-C6.  Laboratory data: Labs 04/25/2020:   Anti-MOG antibodies were high titer positive (1: 320).  HIV was negative.   ANA/Sjogrn's  antibodies were negative.   RPR was negative,  Anti-NMO Ab was negative,   HgbA1c was normal 5.4.  Sugars ranged 110-183.      REVIEW OF SYSTEMS: Constitutional: No fevers, chills, sweats, or change in appetite Eyes: No visual changes, double vision, eye pain Ear, nose and throat: No hearing loss, ear pain, nasal congestion, sore throat Cardiovascular: No chest pain, palpitations Respiratory:  No shortness of breath at rest or with exertion.   No wheezes GastrointestinaI: No nausea, vomiting, diarrhea, abdominal pain, fecal incontinence Genitourinary:  No dysuria, urinary retention or frequency.  No nocturia. Musculoskeletal:  No neck pain, back pain Integumentary: No rash, pruritus, skin  lesions Neurological: as above Psychiatric: No depression at this time.  No anxiety Endocrine: No palpitations, diaphoresis, change in appetite, change in weigh or increased thirst Hematologic/Lymphatic:  No anemia, purpura, petechiae. Allergic/Immunologic: No itchy/runny eyes, nasal congestion, recent allergic reactions, rashes  ALLERGIES: No Known Allergies  HOME MEDICATIONS:  Current Outpatient Medications:    Ascorbic Acid (VITA-C PO), Take 1,000 mg by mouth daily., Disp: , Rfl:    cholecalciferol (VITAMIN D3) 25 MCG (1000 UNIT) tablet, Take 5,000 Units by mouth daily., Disp: , Rfl:    levothyroxine (SYNTHROID) 100 MCG tablet, Take 100 mcg by mouth daily., Disp: , Rfl:    predniSONE (DELTASONE) 5 MG tablet, Take up to 3 pills daily as directed, Disp: 100 tablet, Rfl: 3   Probiotic Product (CULTRELLE KIDS IMMUNE DEFENSE PO), Take 1 tablet by mouth daily., Disp: , Rfl:   PAST MEDICAL HISTORY: Past Medical History:  Diagnosis Date   Hashimoto's disease    Hypothyroidism    Mitral regurgitation    mild by echo 06/2020    PAST SURGICAL HISTORY: Past Surgical History:  Procedure Laterality Date   BIOPSY BREAST  10/17/2020   no results yet   TUBAL LIGATION  02/2012    FAMILY HISTORY: Family History  Problem Relation Age of Onset   GER disease Mother    Other Mother        uterine fibroids, optic disc abnormality    Eczema Mother    Colon polyps Mother    Hypothyroidism Mother    Osteoarthritis Mother    Hypertension Mother    Migraines Mother    Other Father        Colon adenoma   Hyperlipidemia Father    GER disease Father    Heart murmur Father    Amblyopia Father        Right eye   Neuropathy Father    Glaucoma Maternal Grandmother    Macular degeneration Maternal Grandmother    Multiple sclerosis Paternal Grandmother    Breast cancer Neg Hx     SOCIAL HISTORY:  Social History   Socioeconomic History   Marital status: Married    Spouse name: Not on  file   Number of children: 2   Years of education: BS   Highest education level: Not on file  Occupational History   Occupation: Agricultural consultant  Tobacco Use   Smoking status: Never   Smokeless tobacco: Never  Vaping Use   Vaping Use: Never used  Substance and Sexual Activity   Alcohol use: Never   Drug use: Never   Sexual activity: Yes    Birth control/protection: None  Other Topics Concern   Not on file  Social History Narrative   Right handed    Caffeine use: rare   Social Determinants of Health   Financial Resource Strain: Not  on file  Food Insecurity: Not on file  Transportation Needs: Not on file  Physical Activity: Not on file  Stress: Not on file  Social Connections: Not on file  Intimate Partner Violence: Not on file     PHYSICAL EXAM  Vitals:   10/18/20 1601  BP: 130/80  Pulse: 92  Weight: 181 lb (82.1 kg)  Height: 5\' 6"  (1.676 m)    Body mass index is 29.21 kg/m.  No results found.  OS 20/20 -1 OD 20/20 -2  Reduced color saturation OD  General: The patient is well-developed and well-nourished and in no acute distress  HEENT:  Head is [place]/AT.  Sclera are anicteric.    Skin: Extremities are without rash or  edema.   Neurologic Exam  Mental status: The patient is alert and oriented x 3 at the time of the examination. The patient has apparent normal recent and remote memory, with an apparently normal attention span and concentration ability.   Speech is normal.  Cranial nerves: Extraocular movements are full.  Moving the eyes laterally causes pain in the left eye.  She has a 1+ left APD.  Marland Kitchen  Facial strength and sensation was normal.. No obvious hearing deficits are noted.  Motor:  Muscle bulk is normal.   Tone is normal. Strength is  5 / 5 in all 4 extremities.   Sensory: Sensory testing is intact to pinprick, soft touch and vibration sensation in all 4 extremities.  Coordination: Cerebellar testing reveals good finger-nose-finger and  heel-to-shin bilaterally.  Gait and station: Station is normal.   Gait is normal Tandem gait is mildly wide. Romberg is negative.   Reflexes: Deep tendon reflexes are symmetric and normal bilaterally.     DIAGNOSTIC DATA (LABS, IMAGING, TESTING) - I reviewed patient records, labs, notes, testing and imaging myself where available.  Lab Results  Component Value Date   WBC 8.5 05/07/2020   HGB 15.0 05/07/2020   HCT 44.6 05/07/2020   MCV 94 05/07/2020   PLT 220 05/07/2020      Component Value Date/Time   NA 137 07/05/2020 1154   K 4.8 07/05/2020 1154   CL 98 07/05/2020 1154   CO2 25 07/05/2020 1154   GLUCOSE 103 (H) 07/05/2020 1154   GLUCOSE 97 05/06/2020 1429   BUN 16 07/05/2020 1154   CREATININE 0.92 07/05/2020 1154   CALCIUM 9.2 07/05/2020 1154   PROT 6.7 05/06/2020 1429   ALBUMIN 3.4 (L) 05/06/2020 1429   AST 15 05/06/2020 1429   ALT 27 05/06/2020 1429   ALKPHOS 41 05/06/2020 1429   BILITOT 0.4 05/06/2020 1429   GFRNONAA >60 05/06/2020 1429   No results found for: CHOL, HDL, LDLCALC, LDLDIRECT, TRIG, CHOLHDL Lab Results  Component Value Date   HGBA1C 5.4 04/25/2020       ASSESSMENT AND PLAN  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   Taper steroid to 10 mg qod --- we will try to taper further after next Rituxan  2.   Continue Rituxan   Consider MRI and repeat  anti - MOG and other labs next visit 3.   rtc 5 months    Chrissy Ealey A. Felecia Shelling, MD, [facility] 12/21/2424, 8:34 PM Certified in Neurology, Clinical Neurophysiology, Sleep Medicine and Neuroimaging  [facility] [address] [place],  [address] [phone]

## 2020-10-19 ENCOUNTER — Other Ambulatory Visit: Payer: Self-pay | Admitting: Nurse Practitioner

## 2020-10-19 ENCOUNTER — Encounter: Payer: Self-pay | Admitting: *Deleted

## 2020-10-19 ENCOUNTER — Telehealth: Payer: Self-pay | Admitting: Hematology

## 2020-10-19 DIAGNOSIS — C50919 Malignant neoplasm of unspecified site of unspecified female breast: Secondary | ICD-10-CM

## 2020-10-19 NOTE — Telephone Encounter (Signed)
Spoke to patient to confirm morning clinic appointment for 7/13, packet emailed

## 2020-10-22 ENCOUNTER — Other Ambulatory Visit: Payer: Self-pay | Admitting: Neurology

## 2020-10-22 DIAGNOSIS — G378 Other specified demyelinating diseases of central nervous system: Secondary | ICD-10-CM

## 2020-10-23 ENCOUNTER — Other Ambulatory Visit: Payer: Self-pay

## 2020-10-23 ENCOUNTER — Ambulatory Visit
Admission: RE | Admit: 2020-10-23 | Discharge: 2020-10-23 | Disposition: A | Discharge status: Home / Self Care | Payer: Commercial Managed Care - PPO | Source: Ambulatory Visit | Attending: Nurse Practitioner | Admitting: Nurse Practitioner

## 2020-10-23 ENCOUNTER — Other Ambulatory Visit: Payer: Self-pay | Admitting: *Deleted

## 2020-10-23 DIAGNOSIS — C50312 Malignant neoplasm of lower-inner quadrant of left female breast: Secondary | ICD-10-CM

## 2020-10-23 DIAGNOSIS — C50919 Malignant neoplasm of unspecified site of unspecified female breast: Secondary | ICD-10-CM

## 2020-10-23 DIAGNOSIS — Z170 Estrogen receptor positive status [ER+]: Secondary | ICD-10-CM

## 2020-10-23 IMAGING — MG MM DIGITAL DIAGNOSTIC UNILAT*L* W/ TOMO W/ CAD
6 series · 6 of 14 positions shown · non-contrast
Comparison: Previous exam(s).

CLINICAL DATA: 50-year-old female with recent LEFT breast biopsy of
calcifications demonstrating grade 1 invasive ductal carcinoma and
DCIS calcifications. During the biopsy, patient experienced a vagal
reaction and a biopsy marking clip could not be placed. Patient
returns to evaluate if residual calcifications are present for
localization purposes.

EXAM:
DIGITAL DIAGNOSTIC UNILATERAL LEFT MAMMOGRAM WITH TOMOSYNTHESIS AND
CAD
TECHNIQUE: Left digital diagnostic mammography and breast tomosynthesis was
performed. The images were evaluated with computer-aided detection.

[L CC]
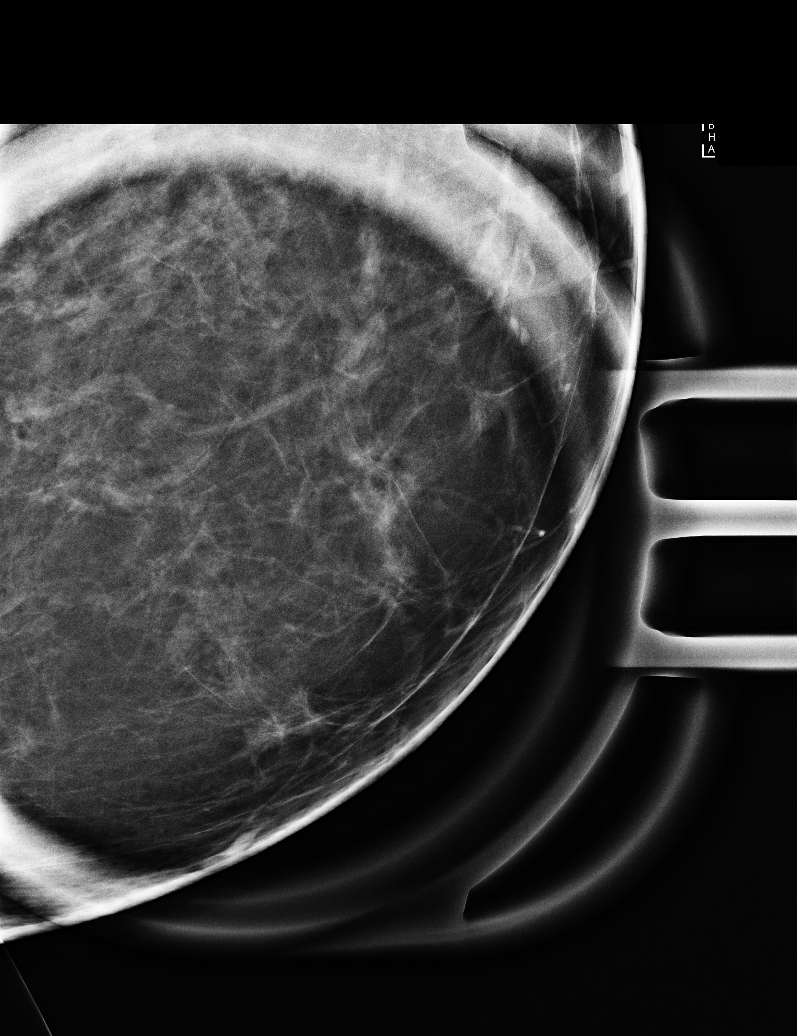

[L ML]
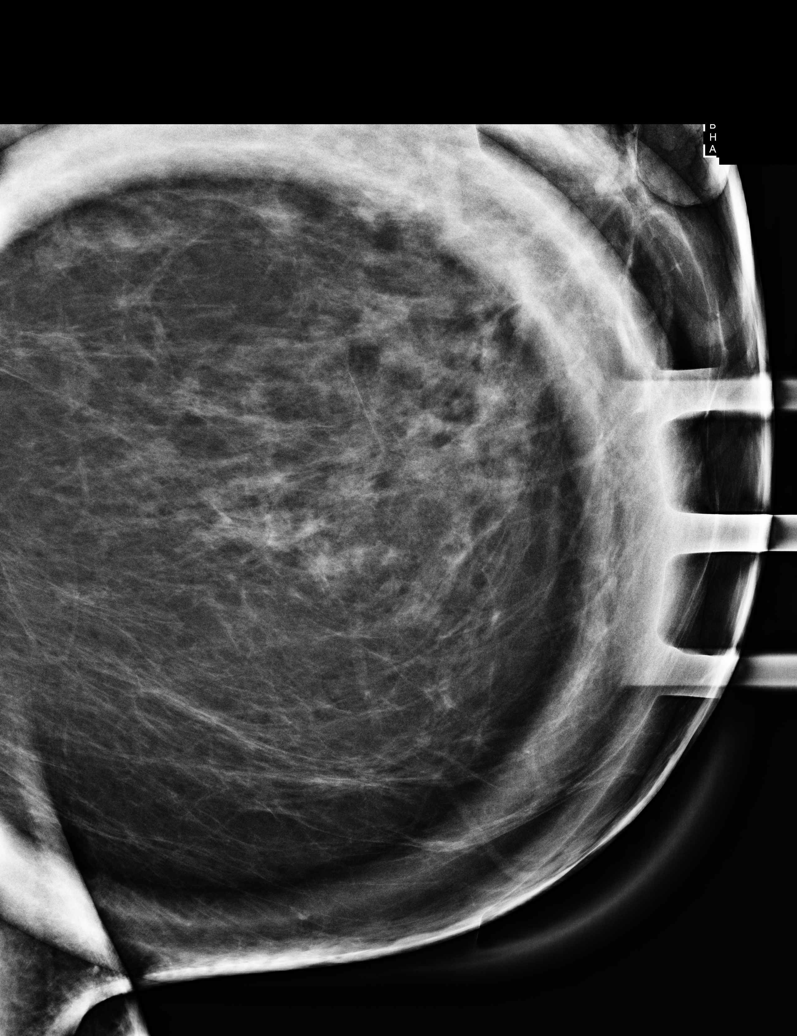

[L MLO synth-2D]
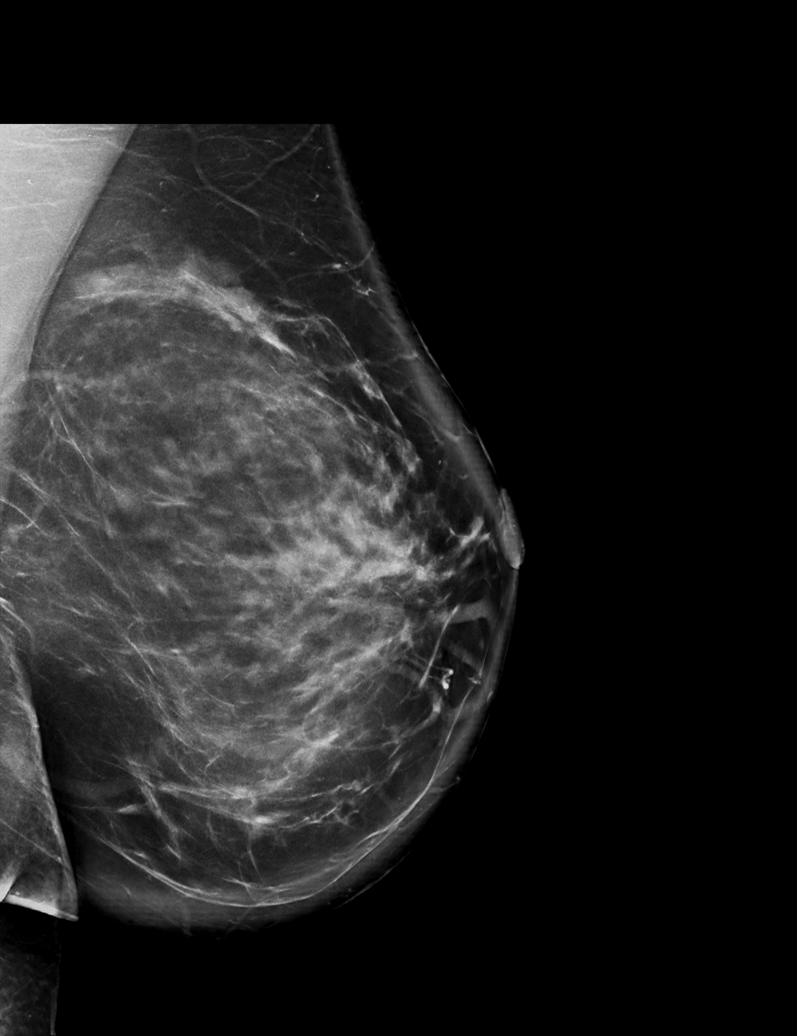

[L CC synth-2D]
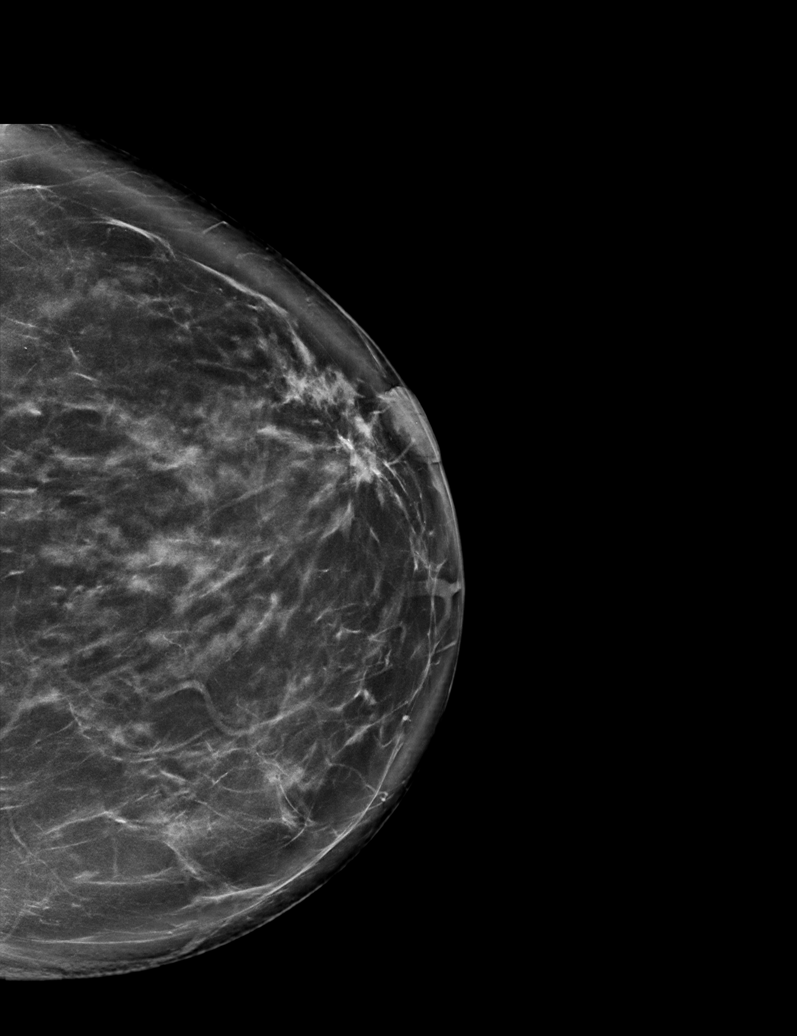

[L CC tomo · tomo slice 45/89.0]
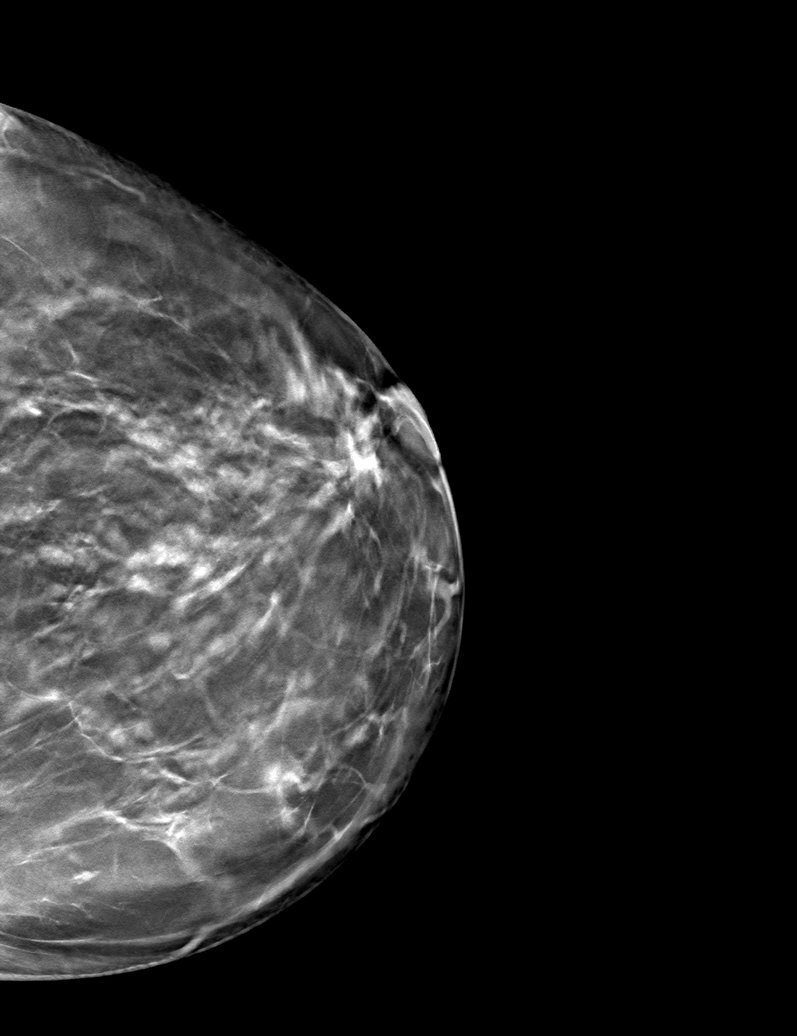

[L MLO tomo · tomo slice 53/105.0]
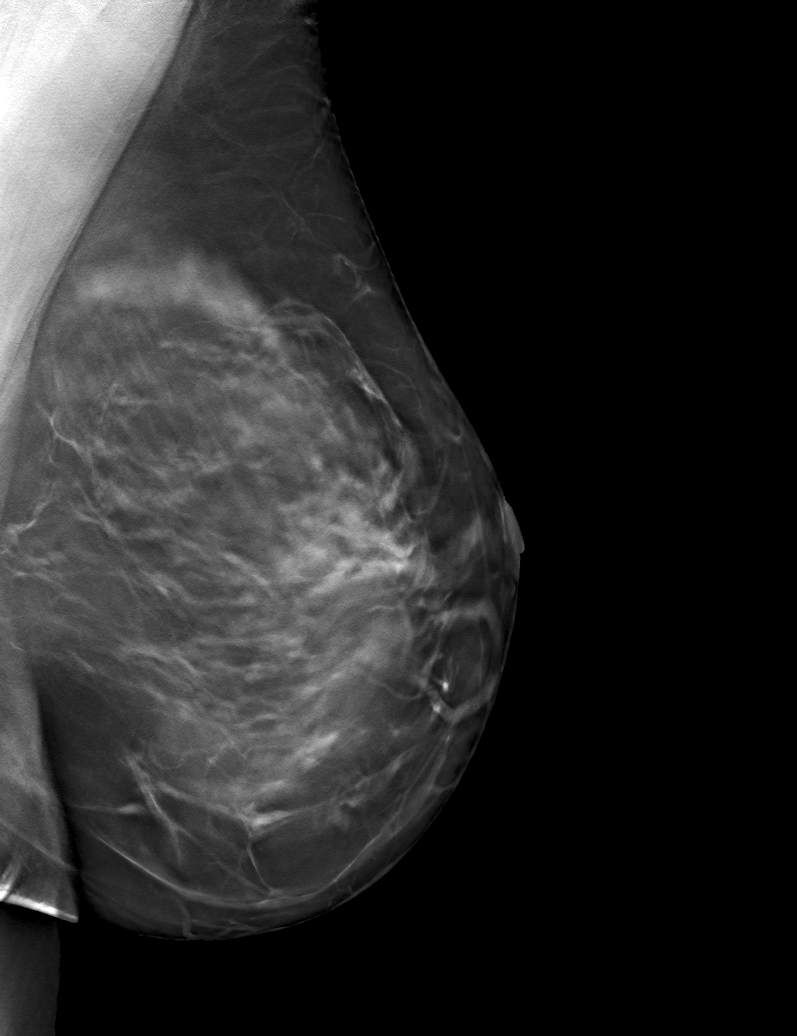

[6 of 14 positions shown; findings below may reference images not displayed]

ACR Breast Density Category b: There are scattered areas of
fibroglandular density.
FINDINGS: Full field and magnification views of the LEFT breast demonstrate no
residual calcifications within the LOWER INNER LEFT breast at the
site of biopsy-proven IDC/DCIS.
IMPRESSION: No residual calcifications for localization purposes at the site of
biopsy-proven LEFT breast IDC/DCIS. If localization is required,
local breast landmarks would need to be utilized for seed/wire
localization.

RECOMMENDATION:
Treatment plan for LEFT breast.

I have discussed the findings and recommendations with the patient.
If applicable, a reminder letter will be sent to the patient
regarding the next appointment.

BI-RADS CATEGORY  6: Known biopsy-proven malignancy.

## 2020-10-23 NOTE — Progress Notes (Signed)
[facility] NOTE  Patient Care Team: Carolee Rota, NP as PCP - General (Nurse Practitioner) Sueanne Margarita, MD as PCP - Cardiology (Cardiology) Mauro Kaufmann, RN as Oncology Nurse Navigator Rockwell Germany, RN as Oncology Nurse Navigator [place], Marcello Moores, MD as Consulting Physician (General Surgery) Nicholas Lose, MD as Consulting Physician (Hematology and Oncology) Kyung Rudd, MD as Consulting Physician (Radiation Oncology)  CHIEF COMPLAINTS/PURPOSE OF CONSULTATION:  Newly diagnosed left breast cancer  HISTORY OF PRESENTING ILLNESS:  Valerie [name] 50 y.o. female is here because of recent diagnosis of cancer of the left breast. Screening mammogram on 09/11/20 showed possible distortion with calcifications in the left breast. Diagnostic mammogram and [place] on 10/03/20 showed 0.6 cm group of indeterminate lower inner left breast calcifications with possible associated subtle distortion and no abnormal appearing left axillary lymph nodes. Biopsy on 10/17/20 showed invasive ductal carcinoma, DCIS with calcifications, ER+(90%)/PR(95%)/Ki67(<5%). She presents to the clinic today for initial evaluation and discussion of treatment options.   I reviewed her records extensively and collaborated the history with the patient.  SUMMARY OF ONCOLOGIC HISTORY: Oncology History  Malignant neoplasm of lower-inner quadrant of left breast in female, estrogen receptor positive ([place])  10/23/2020 Initial Diagnosis   Screening mammogram on 09/11/20 showed possible distortion with calcifications in the left breast. Diagnostic mammogram and [place] on 10/03/20 showed 0.6 cm group of indeterminate lower inner left breast calcifications with possible associated subtle distortion and no abnormal appearing left axillary lymph nodes. Biopsy on 10/17/20 showed invasive ductal carcinoma, DCIS with calcifications, ER+(90%)/PR(95%)/Ki67(<5%).   10/24/2020 Cancer Staging   Staging form: Breast, AJCC 8th  Edition - Clinical stage from 10/24/2020: Stage IA (cT1b, cN0, cM0, G1, ER+, PR+, HER2-) - Signed by Nicholas Lose, MD on 10/24/2020  Stage prefix: Initial diagnosis  Histologic grading system: 3 grade system  Laterality: Left  Staged by: Pathologist and managing physician  Stage used in treatment planning: Yes  National guidelines used in treatment planning: Yes  Type of national guideline used in treatment planning: NCCN      MEDICAL HISTORY:  Past Medical History:  Diagnosis Date   Hashimoto's disease    Hypothyroidism    Mitral regurgitation    mild by echo 06/2020   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)    Optic neuritis     SURGICAL HISTORY: Past Surgical History:  Procedure Laterality Date   BIOPSY BREAST  10/17/2020   no results yet   TUBAL LIGATION  02/2012    SOCIAL HISTORY: Social History   Socioeconomic History   Marital status: Married    Spouse name: Not on file   Number of children: 2   Years of education: BS   Highest education level: Not on file  Occupational History   Occupation: Agricultural consultant  Tobacco Use   Smoking status: Never   Smokeless tobacco: Never  Vaping Use   Vaping Use: Never used  Substance and Sexual Activity   Alcohol use: Never   Drug use: Never   Sexual activity: Yes    Birth control/protection: None  Other Topics Concern   Not on file  Social History Narrative   Right handed    Caffeine use: rare   Social Determinants of Health   Financial Resource Strain: Not on file  Food Insecurity: Not on file  Transportation Needs: Not on file  Physical Activity: Not on file  Stress: Not on file  Social Connections: Not on file  Intimate Partner Violence: Not  on file    FAMILY HISTORY: Family History  Problem Relation Age of Onset   GER disease Mother    Other Mother        uterine fibroids, optic disc abnormality    Eczema Mother    Colon polyps Mother    Hypothyroidism Mother     Osteoarthritis Mother    Hypertension Mother    Migraines Mother    Other Father        Colon adenoma   Hyperlipidemia Father    GER disease Father    Heart murmur Father    Amblyopia Father        Right eye   Neuropathy Father    Glaucoma Maternal Grandmother    Macular degeneration Maternal Grandmother    Multiple sclerosis Paternal Grandmother    Breast cancer Neg Hx     ALLERGIES:  has No Known Allergies.  MEDICATIONS:  Current Outpatient Medications  Medication Sig Dispense Refill   cholecalciferol (VITAMIN D3) 25 MCG (1000 UNIT) tablet Take 5,000 Units by mouth daily.     levothyroxine (SYNTHROID) 100 MCG tablet Take 100 mcg by mouth daily.     Multiple Vitamins-Minerals (ZINC PO) Take 30 mg by mouth.     predniSONE (DELTASONE) 5 MG tablet Take up to 3 pills daily as directed 100 tablet 3   Probiotic Product (CULTRELLE KIDS IMMUNE DEFENSE PO) Take 1 tablet by mouth daily.     Ascorbic Acid (VITA-C PO) Take 1,000 mg by mouth daily.     No current facility-administered medications for this visit.    REVIEW OF SYSTEMS:   Constitutional: Denies fevers, chills or abnormal night sweats Eyes: Denies blurriness of vision, double vision or watery eyes Ears, nose, mouth, throat, and face: Denies mucositis or sore throat Respiratory: Denies cough, dyspnea or wheezes Cardiovascular: Denies palpitation, chest discomfort or lower extremity swelling Gastrointestinal:  Denies nausea, heartburn or change in bowel habits Skin: Denies abnormal skin rashes Lymphatics: Denies new lymphadenopathy or easy bruising Neurological:Denies numbness, tingling or new weaknesses Behavioral/Psych: Mood is stable, no new changes  Breast:  Denies any palpable lumps or discharge All other systems were reviewed with the patient and are negative.  PHYSICAL EXAMINATION: ECOG PERFORMANCE STATUS: 1 - Symptomatic but completely ambulatory  Vitals:   10/24/20 0910  BP: 118/81  Pulse: 74  Resp: 18   Temp: 98.6 F (37 C)  SpO2: 99%   Filed Weights   10/24/20 0910  Weight: 182 lb 12.8 oz (82.9 kg)       LABORATORY DATA:  I have reviewed the data as listed Lab Results  Component Value Date   WBC 6.8 10/24/2020   HGB 14.6 10/24/2020   HCT 42.6 10/24/2020   MCV 94.9 10/24/2020   PLT 256 10/24/2020   Lab Results  Component Value Date   NA 143 10/24/2020   K 4.6 10/24/2020   CL 109 10/24/2020   CO2 25 10/24/2020    RADIOGRAPHIC STUDIES: I have personally reviewed the radiological reports and agreed with the findings in the report.  ASSESSMENT AND PLAN:  Malignant neoplasm of lower-inner quadrant of left breast in female, estrogen receptor positive ([place]) 10/17/2020: Screening mammogram detected distortion in the left breast 1 6 cm LIQ, calcifications in linear distribution, axilla negative, stereotactic biopsy revealed grade 1 IDC with DCIS ER 90%, PR 95%, HER2 negative, Ki-67 less than 5%  Pathology and radiology counseling: Discussed with the patient, the details of pathology including the type of breast cancer,the clinical  staging, the significance of ER, PR and HER-2/neu receptors and the implications for treatment. After reviewing the pathology in detail, we proceeded to discuss the different treatment options between surgery, radiation, and, antiestrogen therapies.  Treatment plan: 1.  Breast conserving surgery with sentinel lymph node biopsy 2. Oncotype DX testing only if it is grade 2 and or the size of the tumor is bigger 3.  Adjuvant radiation 4.  Followed by adjuvant antiestrogen therapy  Return to clinic after surgery to discuss final pathology report.   All questions were answered. The patient knows to call the clinic with any problems, questions or concerns.   Rulon Eisenmenger, MD, MPH 10/24/2020    I, Thana Ates, am acting as scribe for Nicholas Lose, MD.  I have reviewed the above documentation for accuracy and completeness, and I agree with the  above.

## 2020-10-24 ENCOUNTER — Encounter: Payer: Self-pay | Admitting: Radiation Oncology

## 2020-10-24 ENCOUNTER — Ambulatory Visit: Payer: Commercial Managed Care - PPO | Attending: Surgery | Admitting: Physical Therapy

## 2020-10-24 ENCOUNTER — Encounter: Payer: Self-pay | Admitting: Physical Therapy

## 2020-10-24 ENCOUNTER — Encounter: Payer: Self-pay | Admitting: *Deleted

## 2020-10-24 ENCOUNTER — Inpatient Hospital Stay: Payer: Commercial Managed Care - PPO

## 2020-10-24 ENCOUNTER — Inpatient Hospital Stay: Payer: Commercial Managed Care - PPO | Attending: Hematology and Oncology | Admitting: Hematology and Oncology

## 2020-10-24 ENCOUNTER — Encounter: Payer: Self-pay | Admitting: Licensed Clinical Social Worker

## 2020-10-24 ENCOUNTER — Ambulatory Visit: Payer: Self-pay | Admitting: Surgery

## 2020-10-24 ENCOUNTER — Ambulatory Visit
Admission: RE | Admit: 2020-10-24 | Discharge: 2020-10-24 | Disposition: A | Discharge status: Home / Self Care | Payer: Commercial Managed Care - PPO | Source: Ambulatory Visit | Attending: Radiation Oncology | Admitting: Radiation Oncology

## 2020-10-24 DIAGNOSIS — C50312 Malignant neoplasm of lower-inner quadrant of left female breast: Secondary | ICD-10-CM | POA: Insufficient documentation

## 2020-10-24 DIAGNOSIS — Z170 Estrogen receptor positive status [ER+]: Secondary | ICD-10-CM

## 2020-10-24 DIAGNOSIS — C50912 Malignant neoplasm of unspecified site of left female breast: Secondary | ICD-10-CM

## 2020-10-24 DIAGNOSIS — R293 Abnormal posture: Secondary | ICD-10-CM | POA: Diagnosis present

## 2020-10-24 HISTORY — DX: Other specified demyelinating diseases of central nervous system: G37.8

## 2020-10-24 HISTORY — DX: Myelin oligodendrocyte glycoprotein antibody disease: G37.81

## 2020-10-24 HISTORY — DX: Unspecified optic neuritis: H46.9

## 2020-10-24 LAB — CMP (CANCER CENTER ONLY)
ALT: 16 U/L (ref 0–44)
AST: 17 U/L (ref 15–41)
Albumin: 3.4 g/dL — ABNORMAL LOW (ref 3.5–5.0)
Alkaline Phosphatase: 41 U/L (ref 38–126)
Anion gap: 9 (ref 5–15)
BUN: 23 mg/dL — ABNORMAL HIGH (ref 6–20)
CO2: 25 mmol/L (ref 22–32)
Calcium: 9 mg/dL (ref 8.9–10.3)
Chloride: 109 mmol/L (ref 98–111)
Creatinine: 0.97 mg/dL (ref 0.44–1.00)
GFR, Estimated: >60 mL/min (ref >60)
Glucose, Bld: 87 mg/dL (ref 70–99)
Potassium: 4.6 mmol/L (ref 3.5–5.1)
Sodium: 143 mmol/L (ref 135–145)
Total Bilirubin: 0.3 mg/dL (ref 0.3–1.2)
Total Protein: 6.8 g/dL (ref 6.5–8.1)

## 2020-10-24 LAB — GENETIC SCREENING ORDER

## 2020-10-24 LAB — CBC WITH DIFFERENTIAL (CANCER CENTER ONLY)
Abs Immature Granulocytes: 0.02 10*3/uL (ref 0.00–0.07)
Basophils Absolute: 0 10*3/uL (ref 0.0–0.1)
Basophils Relative: 0 %
Eosinophils Absolute: 0.2 10*3/uL (ref 0.0–0.5)
Eosinophils Relative: 3 %
HCT: 42.6 % (ref 36.0–46.0)
Hemoglobin: 14.6 g/dL (ref 12.0–15.0)
Immature Granulocytes: 0 %
Lymphocytes Relative: 14 %
Lymphs Abs: 1 10*3/uL (ref 0.7–4.0)
MCH: 32.5 pg (ref 26.0–34.0)
MCHC: 34.3 g/dL (ref 30.0–36.0)
MCV: 94.9 fL (ref 80.0–100.0)
Monocytes Absolute: 0.8 10*3/uL (ref 0.1–1.0)
Monocytes Relative: 12 %
Neutro Abs: 4.8 10*3/uL (ref 1.7–7.7)
Neutrophils Relative %: 71 %
Platelet Count: 256 10*3/uL (ref 150–400)
RBC: 4.49 MIL/uL (ref 3.87–5.11)
RDW: 12 % (ref 11.5–15.5)
WBC Count: 6.8 10*3/uL (ref 4.0–10.5)
nRBC: 0 % (ref 0.0–0.2)

## 2020-10-24 NOTE — Progress Notes (Signed)
[facility]  Initial Assessment   LAROSE BATRES is a 50 y.o. year old female accompanied by spouse, Theresia Lo. Clinical Social Work was referred by [facility] for assessment of psychosocial needs.   SDOH (Social Determinants of Health) assessments performed: Yes SDOH Interventions    Flowsheet Row Most Recent Value  SDOH Interventions   Food Insecurity Interventions Intervention Not Indicated  Financial Strain Interventions Intervention Not Indicated  Housing Interventions Intervention Not Indicated  Transportation Interventions Intervention Not Indicated       Distress Screen completed: Yes ONCBCN DISTRESS SCREENING 10/24/2020  Screening Type Initial Screening  Distress experienced in past week (1-10) 2  Emotional problem type Nervousness/Anxiety;Adjusting to appearance changes  Information Concerns Type Lack of info about treatment  Physical Problem type Sleep/insomnia      Family/Social Information:  Housing Arrangement: patient lives with husband, Theresia Lo.  Two adult daughters Junie Panning, 68 and [place], 42) live in [place] and [place] respectively Family members/support persons in your life? Family, Friends/Colleagues, and [employer] concerns: no  Employment: Working full time for [employer]. Income source: Employment.  Work has been very supportive through this and her other health diagnosis Financial concerns: No Type of concern: None Food access concerns: no Religious or spiritual practice: yes, involved in church Services Currently in place:  n/a  Coping/ Adjustment to diagnosis: Patient understands treatment plan and what happens next? yes Concerns about diagnosis and/or treatment:  General adjustment to diagnosis. Has also been dealing with other medical issues diagnosed within the last 6 months Patient reported stressors:  Nervousness Patient enjoys gardening and helping others, thrift store shopping Current coping skills/ strengths: Motivation  for treatment/growth, Religious Affiliation, and Supportive family/friends    SUMMARY: Current SDOH Barriers:  No significant SDOH barriers noted today  Clinical Social Work Clinical Goal(s):  No clinical SW goals at this time  Interventions: Discussed common feeling and emotions when being diagnosed with cancer, and the importance of support during treatment Informed patient of the support team roles and support services at [facility]. Added patient to monthly calendar email list Provided CSW contact information and encouraged patient to call with any questions or concerns   Follow Up Plan: Patient will contact CSW with any support or resource needs Patient verbalizes understanding of plan: Yes    Christeen Douglas , LCSW

## 2020-10-24 NOTE — Progress Notes (Signed)
Radiation Oncology         ([phone]) [phone] ________________________________  Name: Valerie [name]        MRN: [id number]  Date of Service: 10/24/2020 DOB: [birth year]  LK:[id number], Thayer Headings, NP  Erroll Luna, MD     REFERRING PHYSICIAN: Erroll Luna, MD   DIAGNOSIS: The encounter diagnosis was Malignant neoplasm of lower-inner quadrant of left breast in female, estrogen receptor positive ([place]).   HISTORY OF PRESENT ILLNESS: Valerie [name] is a 50 y.o. female seen in the multidisciplinary breast clinic for a new diagnosis of left breast cancer. The patient was noted to have a left breast distortion on screening mammography. Diagnostic work up showed a lesion in the lower inner quadrant that measured 6 mm and her axilla was negative for adenopathy. She had a vasovagal episode so she had to come back for clip placement. 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 Her tumor was ER/PR positive, HER2 negative with a Ki 67 of <5%. She's seen today to discuss treatment of her cancer.    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)    Optic neuritis        PAST SURGICAL HISTORY: Past Surgical History:  Procedure Laterality Date   BIOPSY BREAST  10/17/2020   no results yet   TUBAL LIGATION  02/2012     FAMILY HISTORY:  Family History  Problem Relation Age of Onset   GER disease Mother    Other Mother        uterine fibroids, optic disc abnormality    Eczema Mother    Colon polyps Mother    Hypothyroidism Mother    Osteoarthritis Mother    Hypertension Mother    Migraines Mother    Other Father        Colon adenoma   Hyperlipidemia Father    GER disease Father    Heart murmur Father    Amblyopia Father         Right eye   Neuropathy Father    Glaucoma Maternal Grandmother    Macular degeneration Maternal Grandmother    Multiple sclerosis Paternal Grandmother    Breast cancer Neg Hx      SOCIAL HISTORY:  reports that she has never smoked. She has never used smokeless tobacco. She reports that she does not drink alcohol and does not use drugs. The patient is married and lives in [place]. She works in a shipping and receiving role. She's originally from [place].   ALLERGIES: Patient has no known allergies.   MEDICATIONS:  Current Outpatient Medications  Medication Sig Dispense Refill   Ascorbic Acid (VITA-C PO) Take 1,000 mg by mouth daily.     cholecalciferol (VITAMIN D3) 25 MCG (1000 UNIT) tablet Take 5,000 Units by mouth daily.     levothyroxine (SYNTHROID) 100 MCG tablet Take 100 mcg by mouth daily.     predniSONE (DELTASONE) 5 MG tablet Take up to 3 pills daily as directed 100 tablet 3   Probiotic Product (CULTRELLE KIDS IMMUNE DEFENSE PO) Take 1 tablet by mouth daily.     No current facility-administered medications for this encounter.     REVIEW OF SYSTEMS: On review of systems, the patient reports that she is doing  fairly well with her optic neuritis treatments and is due for Rituxan in October 2022. She reports occasional headaches and occasional stress incontinence. She denies any breast concerns otherwise. No other complaints are verbalized.      PHYSICAL EXAM:  Wt Readings from Last 3 Encounters:  10/18/20 181 lb (82.1 kg)  07/10/20 176 lb (79.8 kg)  07/05/20 174 lb 6.4 oz (79.1 kg)   Temp Readings from Last 3 Encounters:  05/06/20 99.1 F (37.3 C) (Oral)  04/29/20 98.3 F (36.8 C) (Oral)   BP Readings from Last 3 Encounters:  10/18/20 130/80  07/13/20 103/79  07/10/20 126/82   Pulse Readings from Last 3 Encounters:  10/18/20 92  07/13/20 73  07/10/20 84    In general this is a well appearing caucasian female in no acute distress. She's alert and  oriented x4 and appropriate throughout the examination. Cardiopulmonary assessment is negative for acute distress and she exhibits normal effort. Bilateral breast exam is deferred.    ECOG = 0  0 - Asymptomatic (Fully active, able to carry on all predisease activities without restriction)  1 - Symptomatic but completely ambulatory (Restricted in physically strenuous activity but ambulatory and able to carry out work of a light or sedentary nature. For example, light housework, office work)  2 - Symptomatic, <50% in bed during the day (Ambulatory and capable of all self care but unable to carry out any work activities. Up and about more than 50% of waking hours)  3 - Symptomatic, >50% in bed, but not bedbound (Capable of only limited self-care, confined to bed or chair 50% or more of waking hours)  4 - Bedbound (Completely disabled. Cannot carry on any self-care. Totally confined to bed or chair)  5 - Death   Eustace Pen MM, Creech RH, Tormey DC, et al. [phone]). "Toxicity and response criteria of the [facility]". [place] Oncol. 5 (6): 649-55    LABORATORY DATA:  Lab Results  Component Value Date   WBC 8.5 05/07/2020   HGB 15.0 05/07/2020   HCT 44.6 05/07/2020   MCV 94 05/07/2020   PLT 220 05/07/2020   Lab Results  Component Value Date   NA 137 07/05/2020   K 4.8 07/05/2020   CL 98 07/05/2020   CO2 25 07/05/2020   Lab Results  Component Value Date   ALT 27 05/06/2020   AST 15 05/06/2020   ALKPHOS 41 05/06/2020   BILITOT 0.4 05/06/2020      RADIOGRAPHY: US BREAST LTD UNI LEFT INC AXILLA  Result Date: 10/03/2020 CLINICAL DATA:  50 year old female for further evaluation of possible LEFT distortion with calcifications EXAM: DIGITAL DIAGNOSTIC UNILATERAL LEFT MAMMOGRAM WITH TOMOSYNTHESIS AND CAD; ULTRASOUND LEFT BREAST LIMITED TECHNIQUE: Left digital diagnostic mammography and breast tomosynthesis was performed. The images were evaluated with  computer-aided detection.; Targeted ultrasound examination of the left breast was performed COMPARISON:  Previous exam(s). ACR Breast Density Category c: The breast tissue is heterogeneously dense, which may obscure small masses. FINDINGS: Full field, magnification and spot compression views of the LEFT breast demonstrate a 0.6 cm group of primarily round calcifications with possible associated subtle distortion within the LOWER INNER LEFT breast, middle depth. Some of these calcifications are arranged in a linear orientation. Targeted ultrasound is performed, showing no sonographic abnormality within the LOWER INNER LEFT breast, in the area of calcifications/possible subtle distortion. No abnormal LEFT axillary lymph nodes are noted. IMPRESSION: 1. 0.6 cm group of indeterminate LOWER INNER LEFT breast calcifications  with possible associated subtle distortion. Tissue sampling is recommended. 2. No abnormal appearing LEFT axillary lymph nodes. RECOMMENDATION: 3D/stereotactic guided LEFT breast biopsy, which will be scheduled. I have discussed the findings and recommendations with the patient. If applicable, a reminder letter will be sent to the patient regarding the next appointment. BI-RADS CATEGORY  4: Suspicious. Electronically Signed   By: Margarette Canada M.D.   On: 10/03/2020 16:20  MM DIAG BREAST TOMO UNI LEFT  Result Date: 10/23/2020 CLINICAL DATA:  50 year old female with recent LEFT breast biopsy of calcifications demonstrating grade 1 invasive ductal carcinoma and DCIS calcifications. During the biopsy, patient experienced a vagal reaction and a biopsy marking clip could not be placed. Patient returns to evaluate if residual calcifications are present for localization purposes. EXAM: DIGITAL DIAGNOSTIC UNILATERAL LEFT MAMMOGRAM WITH TOMOSYNTHESIS AND CAD TECHNIQUE: Left digital diagnostic mammography and breast tomosynthesis was performed. The images were evaluated with computer-aided detection. COMPARISON:   Previous exam(s). ACR Breast Density Category b: There are scattered areas of fibroglandular density. FINDINGS: Full field and magnification views of the LEFT breast demonstrate no residual calcifications within the LOWER INNER LEFT breast at the site of biopsy-proven IDC/DCIS. IMPRESSION: No residual calcifications for localization purposes at the site of biopsy-proven LEFT breast IDC/DCIS. If localization is required, local breast landmarks would need to be utilized for seed/wire localization. RECOMMENDATION: Treatment plan for LEFT breast. I have discussed the findings and recommendations with the patient. If applicable, a reminder letter will be sent to the patient regarding the next appointment. BI-RADS CATEGORY  6: Known biopsy-proven malignancy. Electronically Signed   By: Margarette Canada M.D.   On: 10/23/2020 16:18   MM DIAG BREAST TOMO UNI LEFT  Result Date: 10/03/2020 CLINICAL DATA:  50 year old female for further evaluation of possible LEFT distortion with calcifications EXAM: DIGITAL DIAGNOSTIC UNILATERAL LEFT MAMMOGRAM WITH TOMOSYNTHESIS AND CAD; ULTRASOUND LEFT BREAST LIMITED TECHNIQUE: Left digital diagnostic mammography and breast tomosynthesis was performed. The images were evaluated with computer-aided detection.; Targeted ultrasound examination of the left breast was performed COMPARISON:  Previous exam(s). ACR Breast Density Category c: The breast tissue is heterogeneously dense, which may obscure small masses. FINDINGS: Full field, magnification and spot compression views of the LEFT breast demonstrate a 0.6 cm group of primarily round calcifications with possible associated subtle distortion within the LOWER INNER LEFT breast, middle depth. Some of these calcifications are arranged in a linear orientation. Targeted ultrasound is performed, showing no sonographic abnormality within the LOWER INNER LEFT breast, in the area of calcifications/possible subtle distortion. No abnormal LEFT axillary  lymph nodes are noted. IMPRESSION: 1. 0.6 cm group of indeterminate LOWER INNER LEFT breast calcifications with possible associated subtle distortion. Tissue sampling is recommended. 2. No abnormal appearing LEFT axillary lymph nodes. RECOMMENDATION: 3D/stereotactic guided LEFT breast biopsy, which will be scheduled. I have discussed the findings and recommendations with the patient. If applicable, a reminder letter will be sent to the patient regarding the next appointment. BI-RADS CATEGORY  4: Suspicious. Electronically Signed   By: Margarette Canada M.D.   On: 10/03/2020 16:20  MM LT BREAST BX W LOC DEV 1ST LESION IMAGE BX SPEC STEREO GUIDE  Addendum Date: 10/19/2020   ADDENDUM REPORT: 10/19/2020 12:07 ADDENDUM: Pathology revealed GRADE I INVASIVE DUCTAL CARCINOMA, DUCTAL CARCINOMA IN SITU WITH CALCIFICATIONS of the LEFT breast, lower inner quadrant, middle depth. This was found to be concordant by Dr. Kristopher Oppenheim. Pathology results were discussed with the patient by telephone. The patient reported doing well after  the biopsy with tenderness at the site. Post biopsy instructions and care were reviewed and questions were answered. The patient was encouraged to call [facility] for any additional concerns. The patient was referred to [facility] on October 24, 2020. The procedure was complicated by the patient having a severe vasovagal reaction. She had to be removed from compression and a post biopsy clip could therefore not be placed. The patient was asked to return for a LEFT diagnostic mammogram with magnification views to evaluate for residual calcifications for localization purposes. Further recommendations will be guided by these results. Pathology results reported by Stacie Acres RN on 10/19/2020. Electronically Signed   By: Kristopher Oppenheim M.D.   On: 10/19/2020 12:07   Result Date: 10/19/2020 CLINICAL DATA:   50 year old female with indeterminate left breast calcifications and questioned associated distortion. EXAM: LEFT BREAST STEREOTACTIC CORE NEEDLE BIOPSY COMPARISON:  Previous exams. FINDINGS: The patient and I discussed the procedure of stereotactic-guided biopsy including benefits and alternatives. We discussed the high likelihood of a successful procedure. We discussed the risks of the procedure including infection, bleeding, tissue injury, clip migration, and inadequate sampling. Informed written consent was given. The usual time out protocol was performed immediately prior to the procedure. Using sterile technique and 1% Lidocaine as local anesthetic, under stereotactic guidance, a 9 gauge vacuum assisted device was used to perform core needle biopsy of calcifications in the far medial left breast using a medial approach. Specimen radiograph was performed showing calcifications in multiple specimens. Specimens with calcifications are identified for pathology. Lesion quadrant: Lower inner quadrant At the conclusion of the biopsy the patient had a vasovagal reaction. She was taken out of compression and a post biopsy clip could not be placed. All vital signs remained within normal range during the recovery time. The patient recovered completely under direct surveillance and was released from the department at her baseline disposition. IMPRESSION: Stereotactic-guided biopsy of the left breast, complicated by a vasovagal reaction. The patient recovered fully under direct surveillance and was released from the department at her baseline disposition. A post biopsy clip was not placed at today's visit. Electronically Signed: By: Kristopher Oppenheim M.D. On: 10/17/2020 11:14      IMPRESSION/PLAN: 1. 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 Dr. Lisbeth Renshaw discusses the pathology findings and reviews the nature of early stage breast disease. The consensus from the breast conference  includes breast conservation with lumpectomy with  sentinel node biopsy. Dr. Lindi Adie does not anticipate a role for systemic chemotherapy. Dr. Lisbeth Renshaw does anticipate  external radiotherapy to the breast  to reduce risks of local recurrence followed by antiestrogen therapy. We discussed the risks, benefits, short, and long term effects of radiotherapy, as well as the curative intent, and the patient is interested in proceeding. Dr. Lisbeth Renshaw discusses the delivery and logistics of radiotherapy and anticipates a course of 4 or up to 6 1/2 weeks of radiotherapy to the left breast with deep inspiration breath hold technique.  We will see her back a few weeks after surgery to discuss the simulation process and anticipate we starting radiotherapy about 4-6 weeks after surgery.  2.  MOGAD/Optic Neuritis. Her treatments should not prevent treatment with radiation or antiestrogen therapy. She will continue to follow up with Dr. Felecia Shelling. 3.  Contraceptive Counseling. She does not need pregnancy testing given her prior tubal ligation.  In a visit  lasting 60 minutes, greater than 50% of the time was spent face to face reviewing her case, as well as in preparation of, discussing, and coordinating the patient's care.  The above documentation reflects my direct findings during this shared patient visit. Please see the separate note by Dr. Lisbeth Renshaw on this date for the remainder of the patient's plan of care.    Carola Rhine, [facility]    **Disclaimer: This note was dictated with voice recognition software. Similar sounding words can inadvertently be transcribed and this note may contain transcription errors which may not have been corrected upon publication of note.**

## 2020-10-24 NOTE — Therapy (Signed)
[place], [place], [address] Phone: [phone]   Fax:  [phone]  Physical Therapy Evaluation  Patient Details  Name: Valerie [name] MRN: [id number] Date of Birth: [birth year] Referring Provider (PT): Dr. Erroll Luna   Encounter Date: 10/24/2020   PT End of Session - 10/24/20 1145     Visit Number 1    Number of Visits 2    Date for PT Re-Evaluation 12/19/20    PT Start Time 8099    PT Stop Time 1046   Also saw pt from 1115-1135 for a total of 28 min   PT Time Calculation (min) 8 min    Activity Tolerance Patient tolerated treatment well    Behavior During Therapy [facility] for tasks assessed/performed             Past Medical History:  Diagnosis Date   Hashimoto's disease    Hypothyroidism    Mitral regurgitation    mild by echo 06/2020   Myelin oligodendrocyte glycoprotein antibody disorder (MOGAD) ([place])    Optic neuritis     Past Surgical History:  Procedure Laterality Date   BIOPSY BREAST  10/17/2020   no results yet   TUBAL LIGATION  02/2012    There were no vitals filed for this visit.    Subjective Assessment - 10/24/20 1138     Subjective Patient reports she is here today to be seen by her medical team for her newly diagnosed left breast cancer.    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 It measures 6 mm in the lower inner quadrant. it is ER/PR positive and HER2 negaitve with a Ki67 of < 5%. 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 She is treated with prednisone which impacts fluid retention.    Patient Stated Goals Reduce lymphedema risk and learn post op ROM HEP    Currently in Pain? No/denies                [facility] PT Assessment - 10/24/20 0001        Assessment   Medical Diagnosis Left breast cancer    Referring Provider (PT) Dr. Marcello Moores Cornett    Onset Date/Surgical Date 09/11/20    Hand Dominance Right    Prior Therapy none      Precautions   Precautions Other (comment)    Precaution Comments active cancer      Restrictions   Weight Bearing Restrictions No      Balance Screen   Has the patient fallen in the past 6 months No    Has the patient had a decrease in activity level because of a fear of falling?  No    Is the patient reluctant to leave their home because of a fear of falling?  No      Home Environment   Living Environment Private residence    Living Arrangements Spouse/significant other    Available Help at Discharge Family      Prior Function   Level of Independence Independent    Vocation Full time employment    Vocation Requirements shipping and receiving clerk    Leisure She does not exercise      Cognition   Overall Cognitive Status Within Functional Limits for tasks assessed      Posture/Postural Control  Posture/Postural Control Postural limitations    Postural Limitations Rounded Shoulders;Forward head      ROM / Strength   AROM / PROM / Strength AROM;Strength      AROM   Overall AROM Comments Cervical AROM is WNL    AROM Assessment Site Shoulder    Right/Left Shoulder Right;Left    Right Shoulder Extension 49 Degrees    Right Shoulder Flexion 155 Degrees    Right Shoulder ABduction 156 Degrees    Right Shoulder Internal Rotation 75 Degrees    Right Shoulder External Rotation 91 Degrees    Left Shoulder Extension 43 Degrees    Left Shoulder Flexion 151 Degrees    Left Shoulder ABduction 163 Degrees    Left Shoulder Internal Rotation 67 Degrees    Left Shoulder External Rotation 86 Degrees      Strength   Overall Strength Within functional limits for tasks performed               LYMPHEDEMA/ONCOLOGY QUESTIONNAIRE - 10/24/20 0001       Type   Cancer Type Left breast cancer       Lymphedema Assessments   Lymphedema Assessments Upper extremities      Right Upper Extremity Lymphedema   10 cm Proximal to Olecranon Process 31.8 cm    Olecranon Process 24.5 cm    10 cm Proximal to Ulnar Styloid Process 21.5 cm    Just Proximal to Ulnar Styloid Process 14.9 cm    Across Hand at [employer] 17.3 cm    At [place] of 2nd Digit 6.5 cm      Left Upper Extremity Lymphedema   10 cm Proximal to Olecranon Process 31.2 cm    Olecranon Process 25 cm    10 cm Proximal to Ulnar Styloid Process 21.3 cm    Just Proximal to Ulnar Styloid Process 14.7 cm    Across Hand at [employer] 18.2 cm    At [place] of 2nd Digit 6.6 cm             L-DEX FLOWSHEETS - 10/24/20 1100       L-DEX LYMPHEDEMA SCREENING   Measurement Type Unilateral    L-DEX MEASUREMENT EXTREMITY Upper Extremity    POSITION  Standing    DOMINANT SIDE Right    At Risk Side Left    BASELINE SCORE (UNILATERAL) 2.5             The patient was assessed using the L-Dex machine today to produce a lymphedema index baseline score. The patient will be reassessed on a regular basis (typically every 3 months) to obtain new L-Dex scores. If the score is > 6.5 points away from his/her baseline score indicating onset of subclinical lymphedema, it will be recommended to wear a compression garment for 4 weeks, 12 hours per day and then be reassessed. If the score continues to be > 6.5 points from baseline at reassessment, we will initiate lymphedema treatment. Assessing in this manner has a 95% rate of preventing clinically significant lymphedema.      [name] - 10/24/20 0001     Open a tight or new jar Mild difficulty    Do heavy household chores (wash walls, wash floors) No difficulty    Carry a shopping bag or briefcase No difficulty    Wash your back No difficulty    Use a knife to cut food No difficulty    Recreational activities in which you take some force or impact  through your arm, shoulder, or  hand (golf, hammering, tennis) No difficulty    During the past week, to what extent has your arm, shoulder or hand problem interfered with your normal social activities with family, friends, neighbors, or groups? Not at all    During the past week, to what extent has your arm, shoulder or hand problem limited your work or other regular daily activities Not at all    Arm, shoulder, or hand pain. None    Tingling (pins and needles) in your arm, shoulder, or hand None    Difficulty Sleeping No difficulty    DASH Score 2.27 %              Objective measurements completed on examination: See above findings.        Patient was instructed today in a home exercise program today for post op shoulder range of motion. These included active assist shoulder flexion in sitting, scapular retraction, wall walking with shoulder abduction, and hands behind head external rotation.  She was encouraged to do these twice a day, holding 3 seconds and repeating 5 times when permitted by her physician.         PT Education - 10/24/20 1145     Education Details Lymphedema risk reduction and post op shoulder HEP    Person(s) Educated Patient;Spouse    Methods Explanation;Demonstration;Handout    Comprehension Returned demonstration;Verbalized understanding                 PT Long Term Goals - 10/24/20 1149       PT LONG TERM GOAL #1   Title Patient will demonstrate she has regained full shoulder ROM and function post operatively compared to baselines.    Time 8    Period Weeks    Status New    Target Date 12/19/20             Breast Clinic Goals - 10/24/20 1149       Patient will be able to verbalize understanding of pertinent lymphedema risk reduction practices relevant to her diagnosis specifically related to skin care.   Time 1    Period Days    Status Achieved      Patient will be able to return demonstrate and/or verbalize understanding of the post-op home exercise  program related to regaining shoulder range of motion.   Time 1    Period Days    Status Achieved      Patient will be able to verbalize understanding of the importance of attending the postoperative After Breast Cancer Class for further lymphedema risk reduction education and therapeutic exercise.   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 It measures 6 mm in the lower inner quadrant. it is ER/PR positive and HER2 negaitve with a Ki67 of < 5%. 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 She is treated with prednisone which impacts fluid retention. Her multidisciplinary medical team met prior to her assessments to determine a recommended treatment plan. She is planning to have a left lumpectomy and sentinel node biopsy followed by radiation and anti-estrogen therapy. She will benefit from a post op PT  reassessment to determine needs and from L-Dex screens to detect subclinical lymphedema.    Stability/Clinical Decision Making Stable/Uncomplicated    Clinical Decision Making Low    Rehab Potential Excellent    PT Frequency --   Eval and 1 f/u visit   PT Treatment/Interventions ADLs/Self Care Home Management;Therapeutic exercise;Patient/family education    PT Next Visit Plan Will reassess 3-4 weeks post op    PT Home Exercise Plan Post op shoulder ROM HEP    Consulted and Agree with Plan of Care Patient;Family member/caregiver    Family Member Consulted Husband             Patient will benefit from skilled therapeutic intervention in order to improve the following deficits and impairments:  Postural dysfunction, Decreased range of motion, Decreased knowledge of precautions, Impaired UE functional use, Pain  Visit  Diagnosis: Malignant neoplasm of lower-inner quadrant of left breast in female, estrogen receptor positive ([place]) - Plan: PT plan of care cert/re-cert  Abnormal posture - Plan: PT plan of care cert/re-cert   Patient will follow up at outpatient cancer rehab 3-4 weeks following surgery.  If the patient requires physical therapy at that time, a specific plan will be dictated and sent to the referring physician for approval. The patient was educated today on appropriate basic range of motion exercises to begin post operatively and the importance of attending the After Breast Cancer class following surgery.  Patient was educated today on lymphedema risk reduction practices as it pertains to recommendations that will benefit the patient immediately following surgery.  She verbalized good understanding.     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

## 2020-10-24 NOTE — Assessment & Plan Note (Signed)
10/17/2020: Screening mammogram detected distortion in the left breast 1 6 cm LIQ, calcifications in linear distribution, axilla negative, stereotactic biopsy revealed grade 1 IDC with DCIS ER 90%, PR 95%, HER2 negative, Ki-67 less than 5%  Pathology and radiology counseling: Discussed with the patient, the details of pathology including the type of breast cancer,the clinical staging, the significance of ER, PR and HER-2/neu receptors and the implications for treatment. After reviewing the pathology in detail, we proceeded to discuss the different treatment options between surgery, radiation, and, antiestrogen therapies.  Treatment plan: 1.  Breast conserving surgery with sentinel lymph node biopsy 2. Oncotype DX testing only if it is grade 2 and or the size of the tumor is bigger 3.  Adjuvant radiation 4.  Followed by adjuvant antiestrogen therapy  Return to clinic after surgery to discuss final pathology report.

## 2020-10-24 NOTE — Patient Instructions (Signed)

## 2020-10-29 ENCOUNTER — Other Ambulatory Visit: Payer: Self-pay | Admitting: Surgery

## 2020-10-29 ENCOUNTER — Encounter: Payer: Self-pay | Admitting: *Deleted

## 2020-10-29 DIAGNOSIS — Z170 Estrogen receptor positive status [ER+]: Secondary | ICD-10-CM

## 2020-10-29 DIAGNOSIS — C50912 Malignant neoplasm of unspecified site of left female breast: Secondary | ICD-10-CM

## 2020-10-29 DIAGNOSIS — C50312 Malignant neoplasm of lower-inner quadrant of left female breast: Secondary | ICD-10-CM

## 2020-10-30 ENCOUNTER — Encounter: Payer: Self-pay | Admitting: *Deleted

## 2020-10-30 ENCOUNTER — Telehealth: Payer: Self-pay | Admitting: Hematology and Oncology

## 2020-10-30 ENCOUNTER — Telehealth: Payer: Self-pay | Admitting: *Deleted

## 2020-10-30 NOTE — Telephone Encounter (Signed)
Scheduled appt per 7/18 sch msg. Pt aware.

## 2020-10-30 NOTE — Telephone Encounter (Signed)
Spoke with patient to follow up from [facility] and to assess navigation needs.  No needs or concerns at this time. Reviewed appts. Encouraged her to call should anything arise.  Patient verbalized understanding.

## 2020-11-06 ENCOUNTER — Encounter: Payer: Self-pay | Admitting: Hematology and Oncology

## 2020-11-07 ENCOUNTER — Other Ambulatory Visit: Payer: Self-pay

## 2020-11-07 ENCOUNTER — Telehealth: Payer: Self-pay

## 2020-11-07 ENCOUNTER — Encounter (HOSPITAL_BASED_OUTPATIENT_CLINIC_OR_DEPARTMENT_OTHER): Payer: Self-pay | Admitting: Surgery

## 2020-11-07 DIAGNOSIS — Z006 Encounter for examination for normal comparison and control in clinical research program: Secondary | ICD-10-CM

## 2020-11-07 NOTE — Telephone Encounter (Signed)
Called patient for 90 day Identify phone call pt stated she is not having anymore cardiac symptoms. No further follow up or diagnostic testing were necessary. I reminded the patient that we would be calling her back around April for a year follow up phone call.

## 2020-11-12 ENCOUNTER — Telehealth: Payer: Self-pay | Admitting: Neurology

## 2020-11-12 HISTORY — PX: BREAST LUMPECTOMY: SHX2

## 2020-11-12 NOTE — Telephone Encounter (Signed)
Late entry: Patient called after-hours call center on 11/10/2020 regarding her vision changes.  I was able to talk to her.  She reported that she had recently increased her prednisone to 15 mg every other day for vision changes.  Initially, the increase in prednisone had helped.  She had some return in eye pain but no drastic changes.  She did not have any symptoms warranting an ER visit, and she agreed.  She was advised to continue with the prednisone 15 mg every other day and monitor her symptoms.  She was advised that I would send a message to her primary neurologist to see if a different course of action was suggested.  She demonstrated understanding and agreement.

## 2020-11-14 ENCOUNTER — Telehealth: Payer: Self-pay | Admitting: Neurology

## 2020-11-14 ENCOUNTER — Other Ambulatory Visit: Payer: Self-pay

## 2020-11-14 ENCOUNTER — Ambulatory Visit
Admission: RE | Admit: 2020-11-14 | Discharge: 2020-11-14 | Disposition: A | Discharge status: Home / Self Care | Payer: Commercial Managed Care - PPO | Source: Ambulatory Visit | Attending: Surgery | Admitting: Surgery

## 2020-11-14 DIAGNOSIS — Z170 Estrogen receptor positive status [ER+]: Secondary | ICD-10-CM

## 2020-11-14 IMAGING — MG MM PLC BREAST LOC DEV 1ST LESION INC MAMMO GUIDE*L*
8 of 9 series · 8 of 13 positions shown · non-contrast
Comparison: Previous exam(s).

CLINICAL DATA: 50-year-old female with newly diagnosed left breast
cancer presenting for localization prior to lumpectomy.

EXAM:
MAMMOGRAPHIC GUIDED RADIOACTIVE SEED LOCALIZATION OF THE LEFT BREAST

[L ML (1 of 4)]
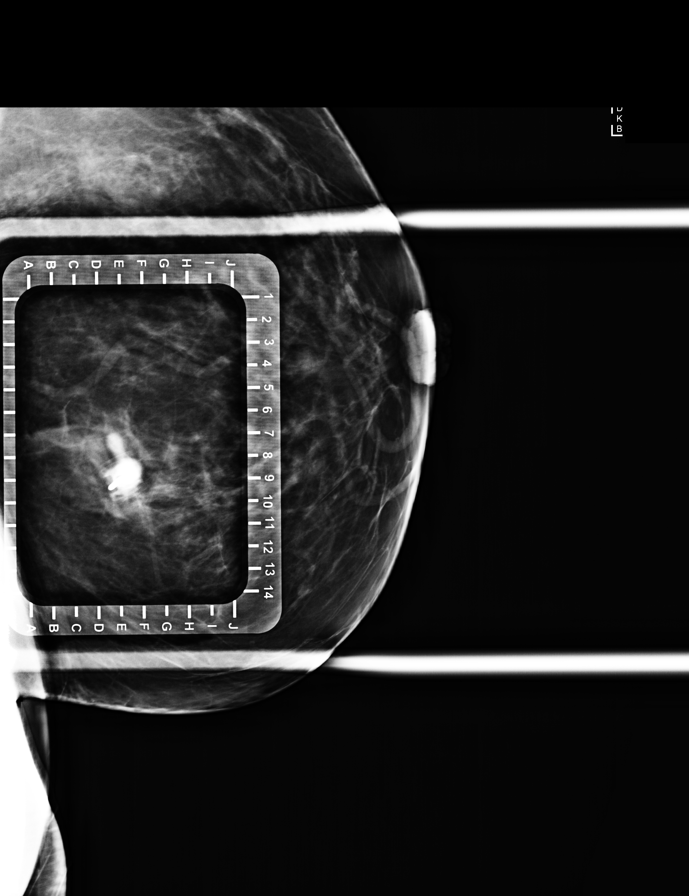

[L CC (1 of 3)]
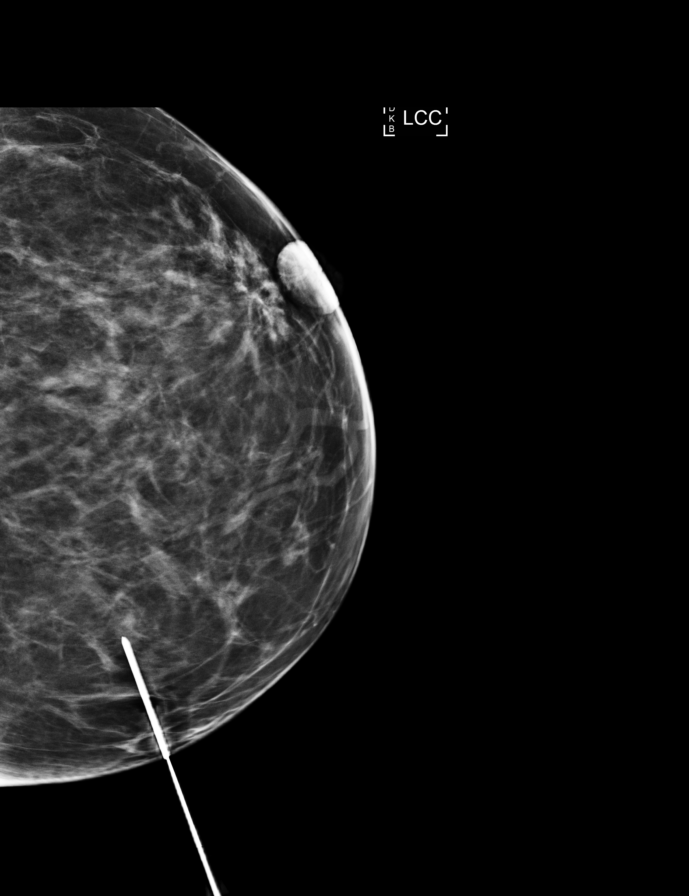

[L CC (2 of 3)]
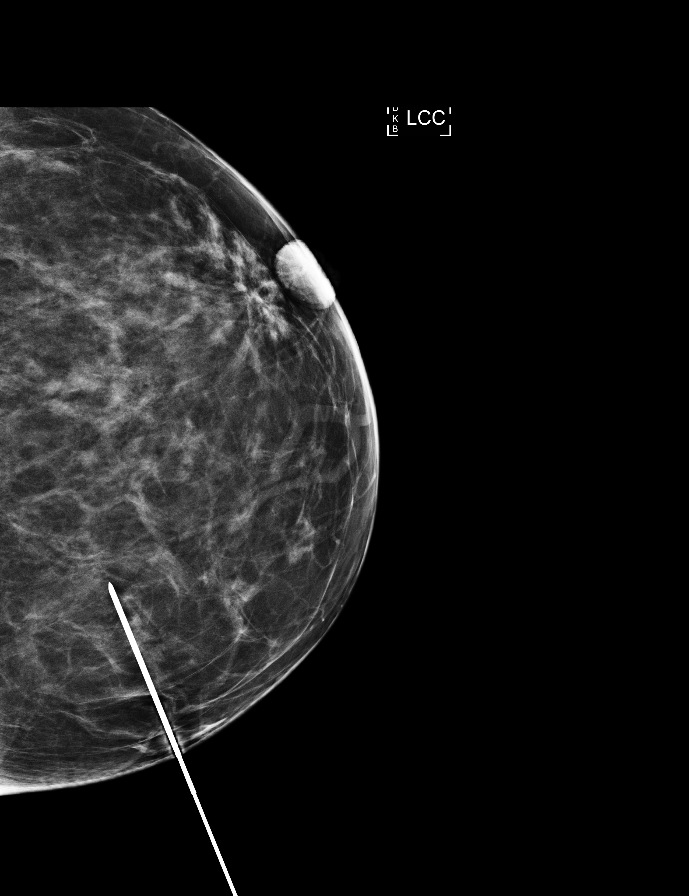

[L CC (3 of 3)]
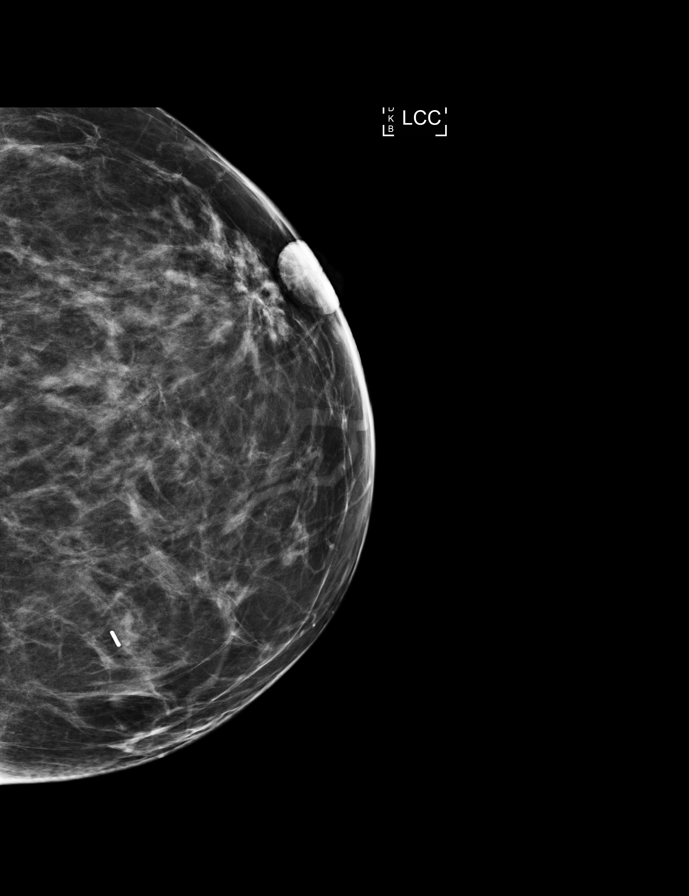

[L ML (2 of 4)]
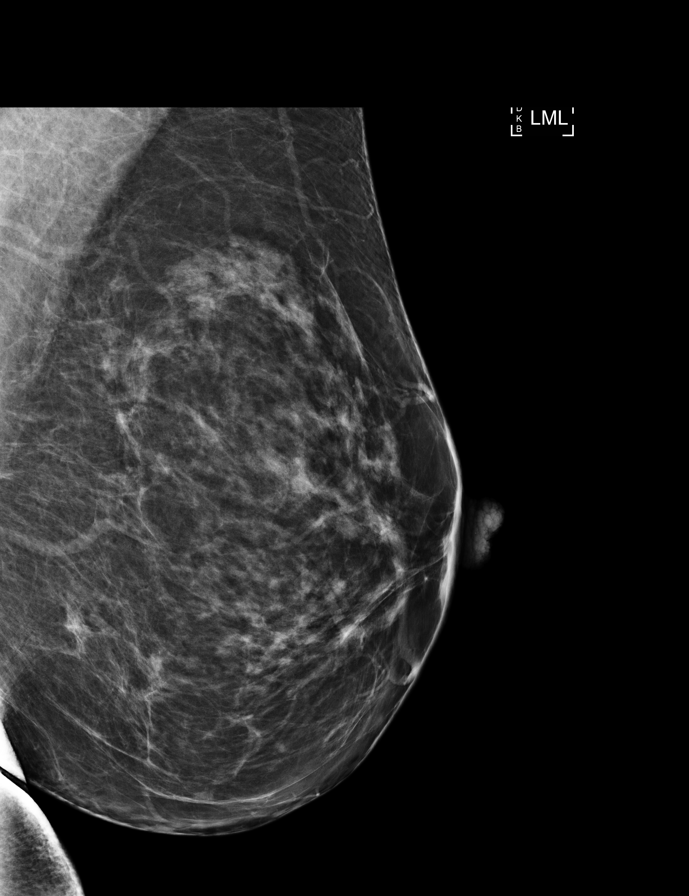

[L ML (3 of 4)]
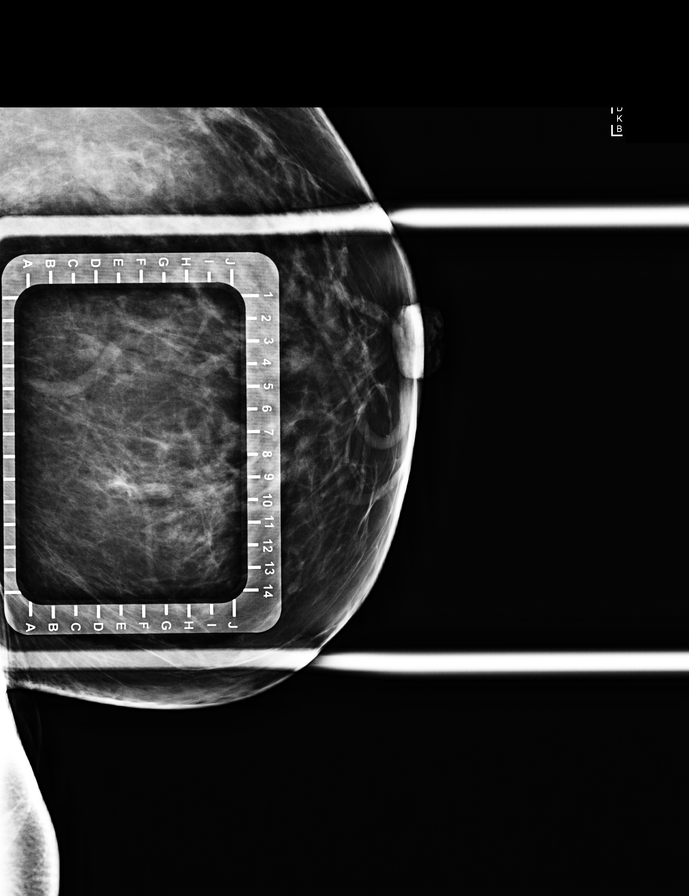

[L ML (4 of 4)]
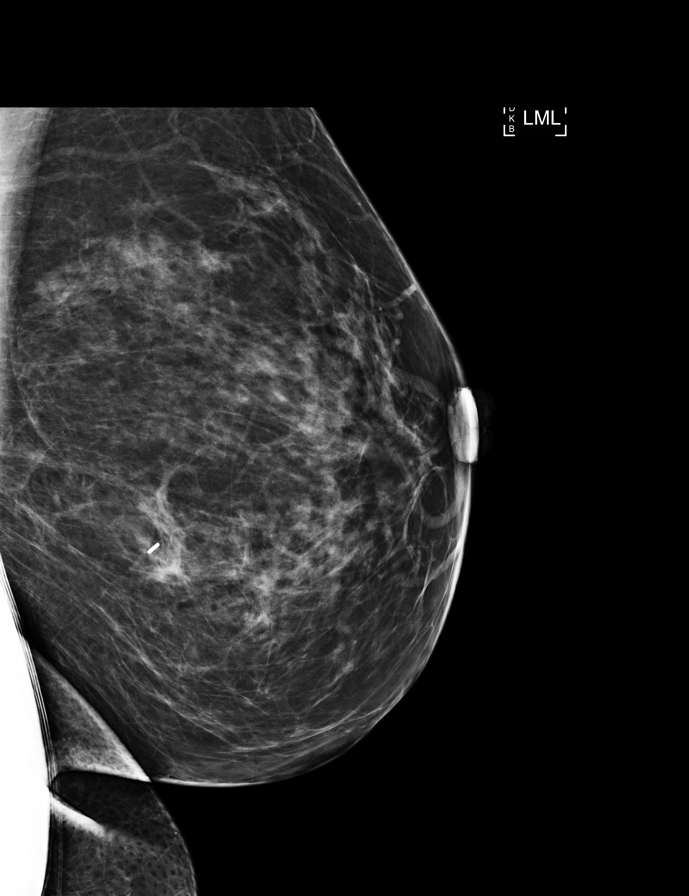

[L ML synth-2D]
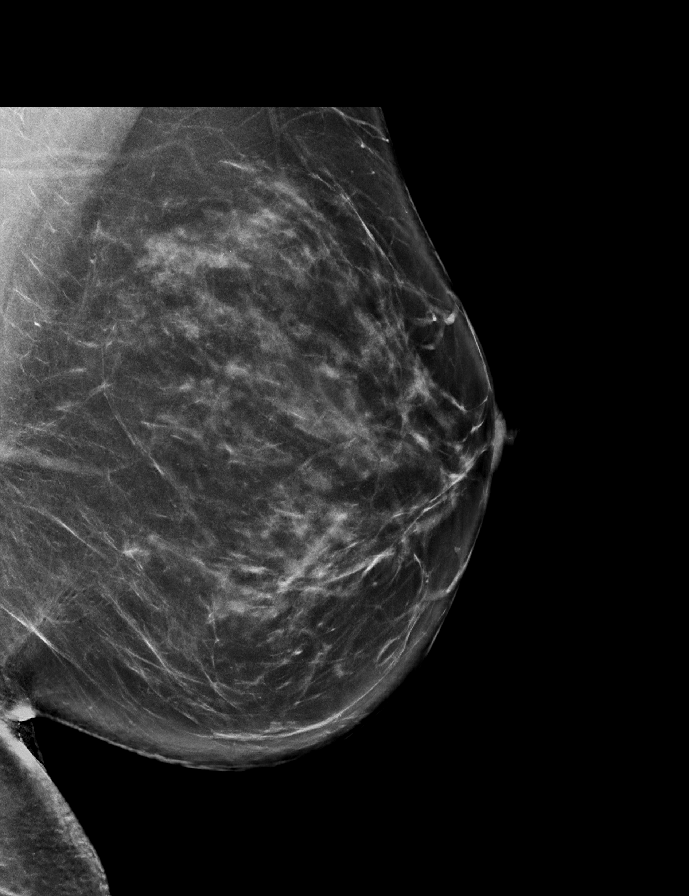

[8 of 13 positions shown; findings below may reference images not displayed]

FINDINGS: Patient presents for radioactive seed localization prior to left
breast lumpectomy. I met with the patient and we discussed the
procedure of seed localization including benefits and alternatives.
We discussed the high likelihood of a successful procedure. We
discussed the risks of the procedure including infection, bleeding,
tissue injury and further surgery. We discussed the low dose of
radioactivity involved in the procedure. Informed, written consent
was given.

The usual time-out protocol was performed immediately prior to the
procedure.

Using mammographic guidance, sterile technique, 1% lidocaine and an
[U1] radioactive seed, the asymmetry/distortion in the lower inner
left breast was localized using a medial approach. The follow-up
mammogram images confirm the seed in the expected location and were
marked for Dr. CHE WAH.

Follow-up survey of the patient confirms presence of the radioactive
seed.

Order number of [U1] seed:  [PHONE_NUMBER].

Total activity: [U1]  reference Date: [DATE]

The patient tolerated the procedure well and was released from the
[REDACTED]. She was given instructions regarding seed removal.
IMPRESSION: Radioactive seed localization of the left breast. No apparent
complications.

## 2020-11-14 NOTE — Telephone Encounter (Signed)
Patient paperwork mailed not picked up from earlier int he year.

## 2020-11-14 NOTE — Telephone Encounter (Signed)
Mailed out care provider form was supposed to be picked up in [place].

## 2020-11-15 ENCOUNTER — Ambulatory Visit (HOSPITAL_COMMUNITY): Payer: Commercial Managed Care - PPO

## 2020-11-15 ENCOUNTER — Ambulatory Visit
Admission: RE | Admit: 2020-11-15 | Discharge: 2020-11-15 | Disposition: A | Discharge status: Home / Self Care | Payer: Commercial Managed Care - PPO | Source: Ambulatory Visit | Attending: Surgery | Admitting: Surgery

## 2020-11-15 ENCOUNTER — Ambulatory Visit (HOSPITAL_BASED_OUTPATIENT_CLINIC_OR_DEPARTMENT_OTHER)
Admission: RE | Admit: 2020-11-15 | Discharge: 2020-11-15 | Disposition: A | Discharge status: Home / Self Care | Payer: Commercial Managed Care - PPO | Attending: Surgery | Admitting: Surgery

## 2020-11-15 ENCOUNTER — Ambulatory Visit (HOSPITAL_BASED_OUTPATIENT_CLINIC_OR_DEPARTMENT_OTHER): Payer: Commercial Managed Care - PPO | Admitting: Anesthesiology

## 2020-11-15 ENCOUNTER — Other Ambulatory Visit: Payer: Self-pay

## 2020-11-15 ENCOUNTER — Encounter (HOSPITAL_BASED_OUTPATIENT_CLINIC_OR_DEPARTMENT_OTHER)
Admission: RE | Disposition: A | Discharge status: Home / Self Care | Payer: Self-pay | Source: Home / Self Care | Attending: Surgery

## 2020-11-15 ENCOUNTER — Encounter (HOSPITAL_BASED_OUTPATIENT_CLINIC_OR_DEPARTMENT_OTHER): Payer: Self-pay | Admitting: Surgery

## 2020-11-15 ENCOUNTER — Ambulatory Visit (HOSPITAL_COMMUNITY)
Admission: RE | Admit: 2020-11-15 | Discharge: 2020-11-15 | Disposition: A | Discharge status: Home / Self Care | Payer: Commercial Managed Care - PPO | Source: Ambulatory Visit | Attending: Surgery | Admitting: Surgery

## 2020-11-15 DIAGNOSIS — C50512 Malignant neoplasm of lower-outer quadrant of left female breast: Secondary | ICD-10-CM | POA: Insufficient documentation

## 2020-11-15 DIAGNOSIS — H469 Unspecified optic neuritis: Secondary | ICD-10-CM | POA: Diagnosis not present

## 2020-11-15 DIAGNOSIS — Z170 Estrogen receptor positive status [ER+]: Secondary | ICD-10-CM

## 2020-11-15 DIAGNOSIS — C50912 Malignant neoplasm of unspecified site of left female breast: Secondary | ICD-10-CM

## 2020-11-15 DIAGNOSIS — N641 Fat necrosis of breast: Secondary | ICD-10-CM | POA: Insufficient documentation

## 2020-11-15 HISTORY — DX: Malignant (primary) neoplasm, unspecified: C80.1

## 2020-11-15 HISTORY — PX: BREAST LUMPECTOMY WITH RADIOACTIVE SEED AND SENTINEL LYMPH NODE BIOPSY: SHX6550

## 2020-11-15 LAB — POCT PREGNANCY, URINE: Preg Test, Ur: NEGATIVE

## 2020-11-15 IMAGING — MG MM BREAST SURGICAL SPECIMEN
1 series · 2 of 2 positions shown · non-contrast
Comparison: Previous exam(s).

CLINICAL DATA: Evaluate specimen

EXAM:
SPECIMEN RADIOGRAPH OF THE LEFT BREAST

[Series 1: L · left · 0.07mm/px · 2 of 2 slices shown]
[im 1/2]
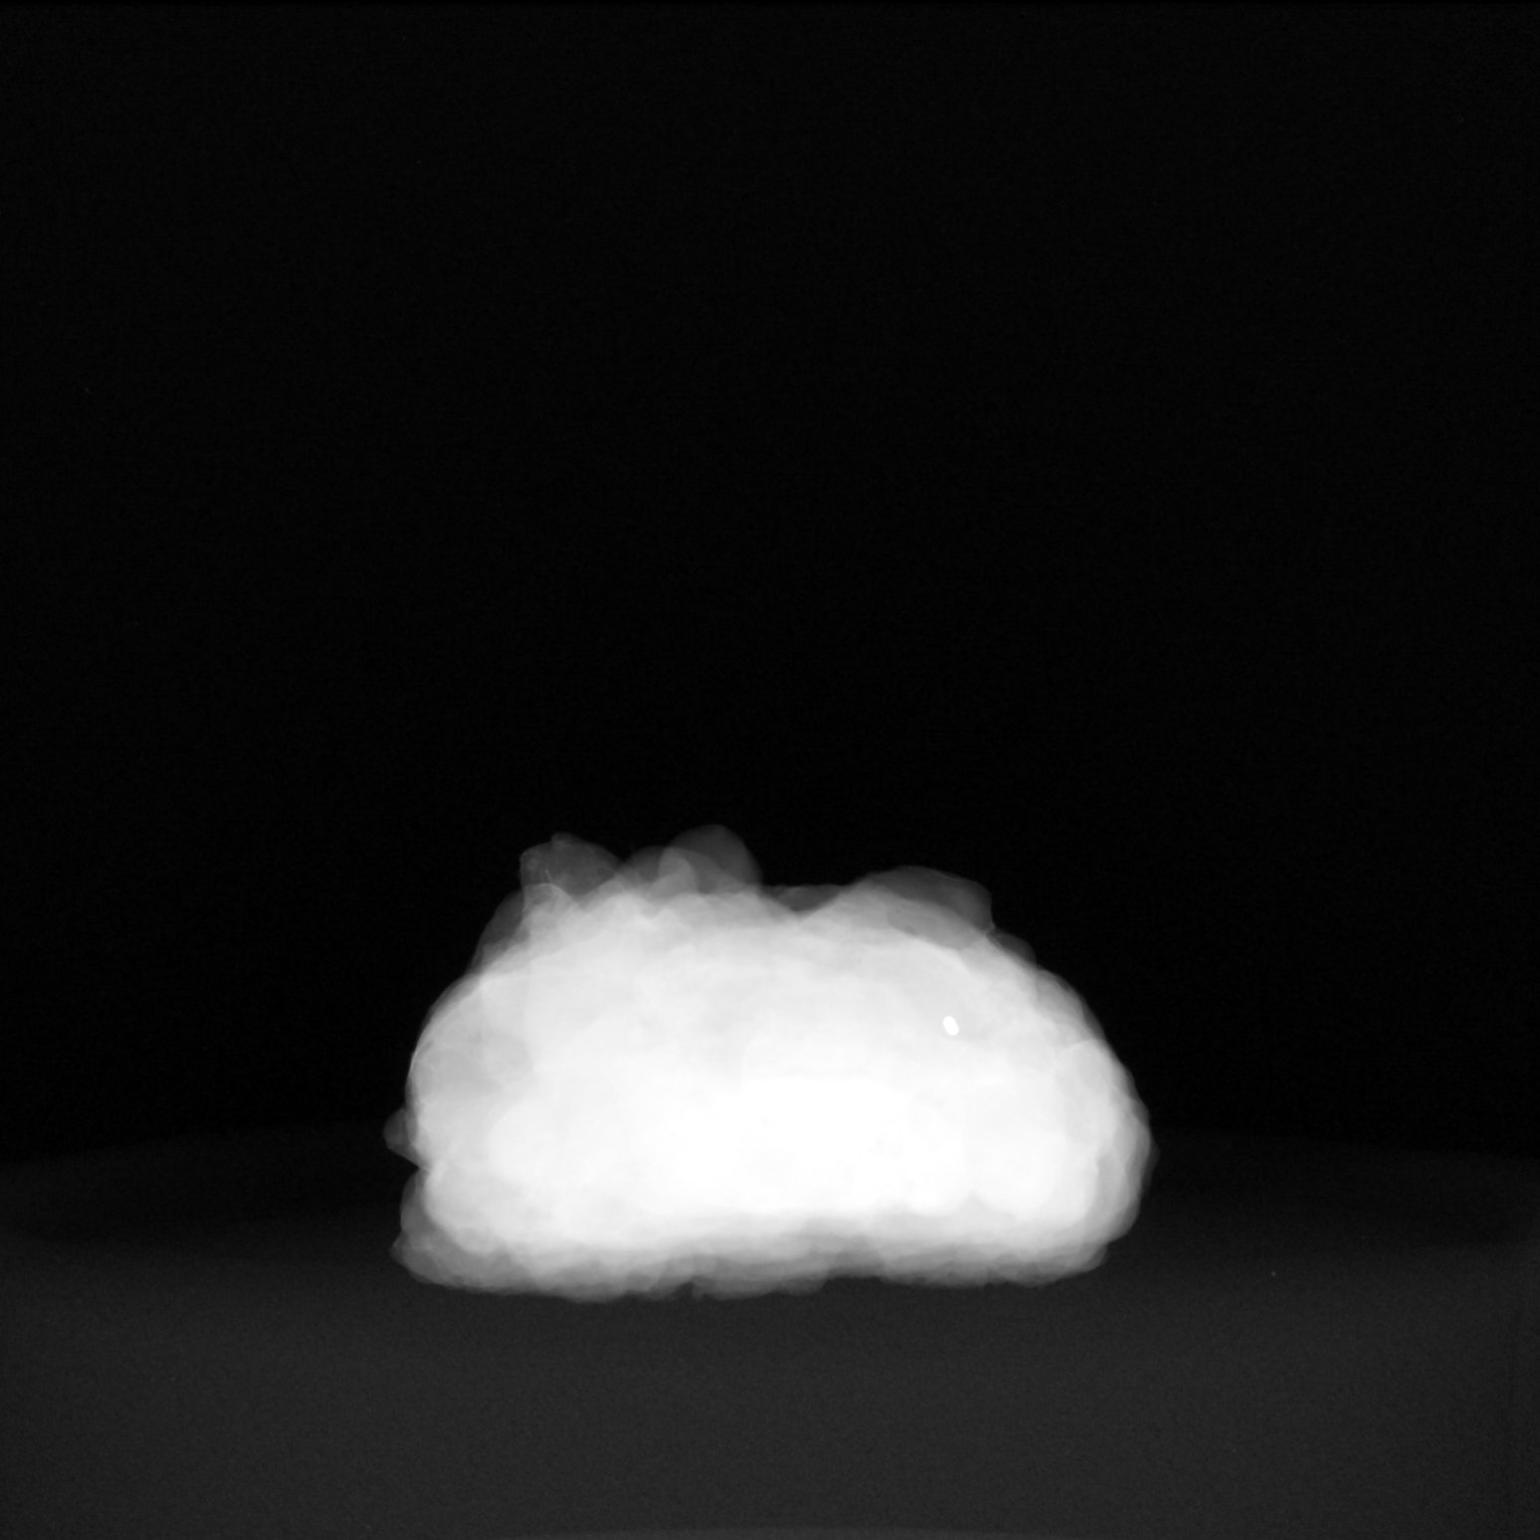
[im 2/2]
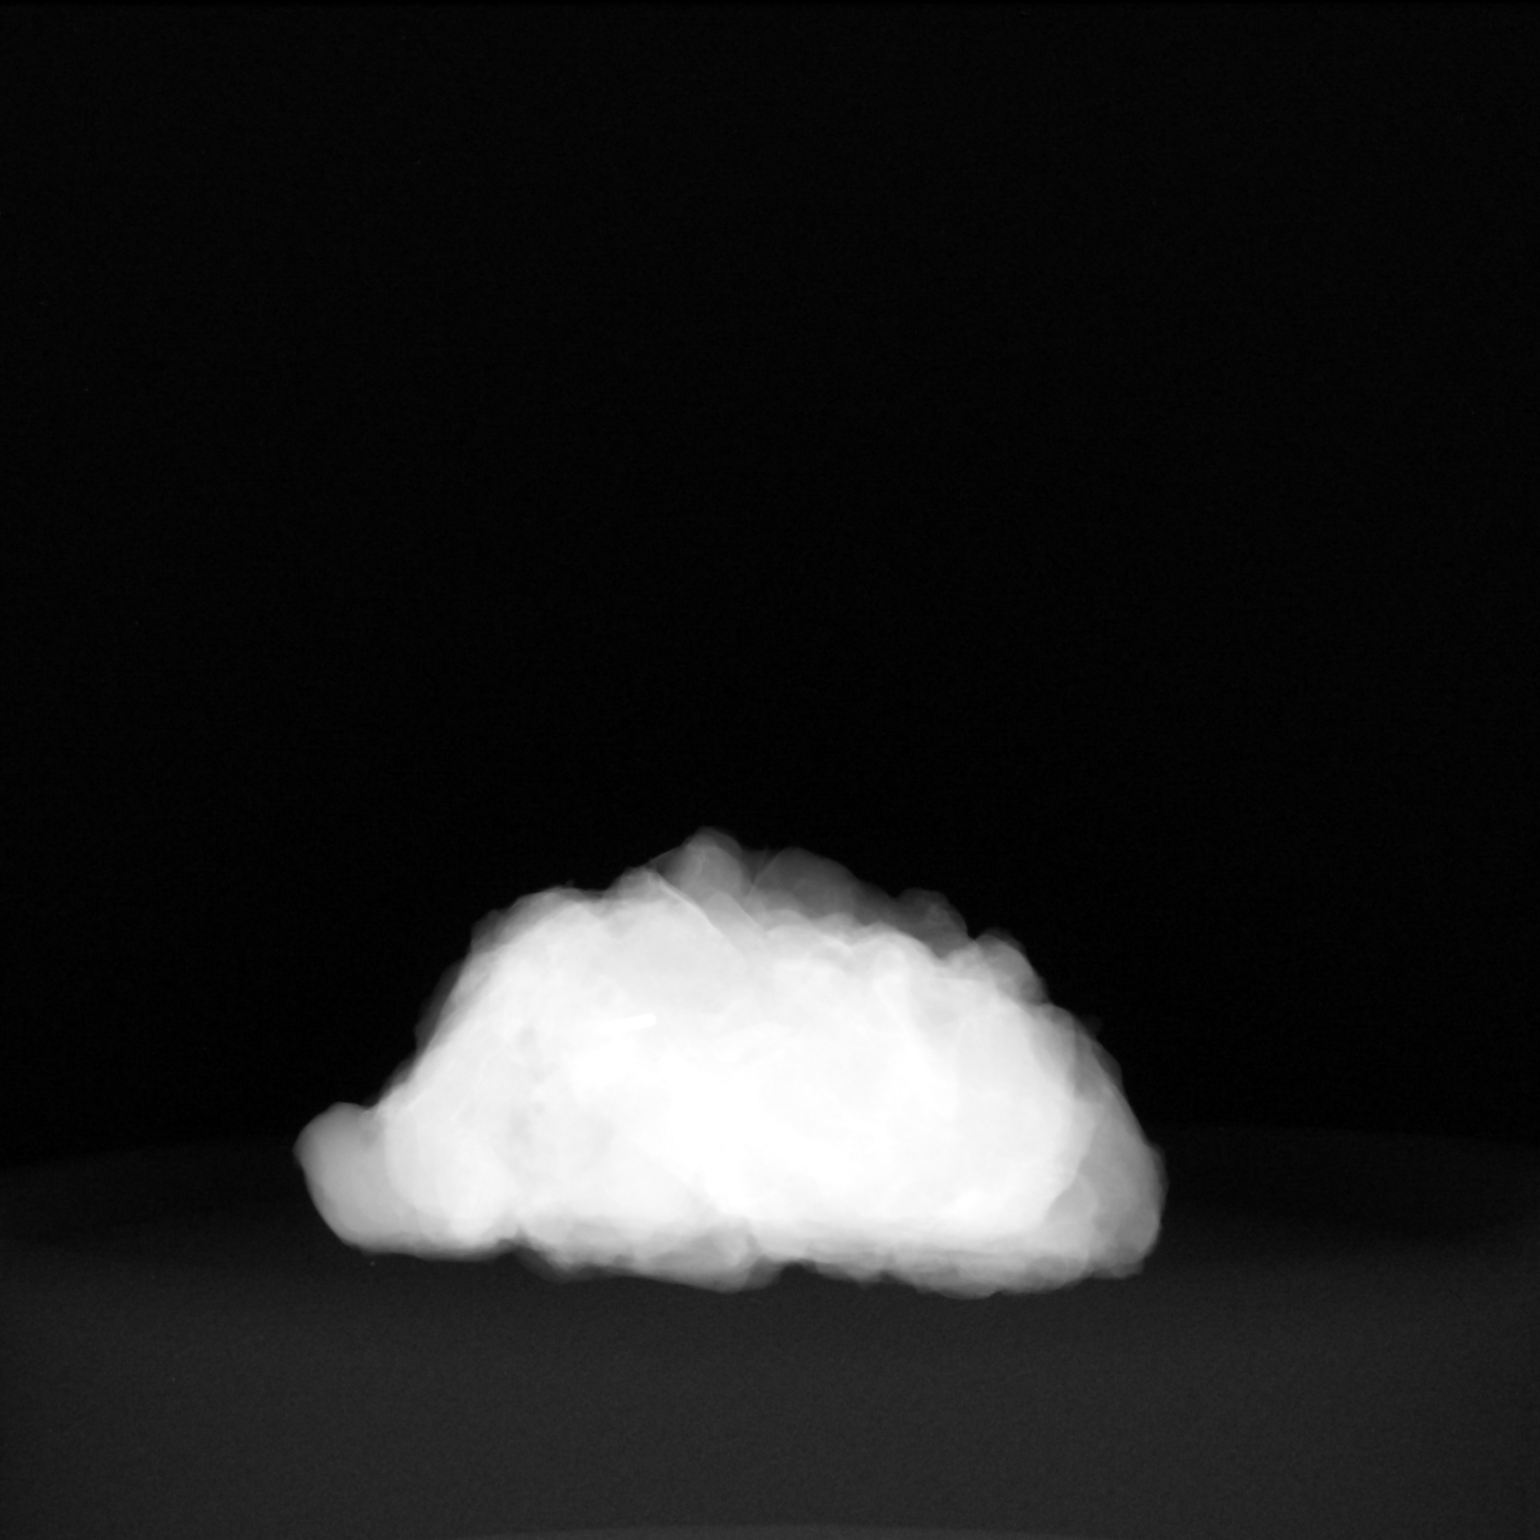

[2 of 2 positions shown; findings below may reference images not displayed]

FINDINGS: Status post excision of the left breast. The radioactive seed is
present, completely intact, and were marked for pathology.
IMPRESSION: Specimen radiograph of the left breast.

## 2020-11-15 SURGERY — BREAST LUMPECTOMY WITH RADIOACTIVE SEED AND SENTINEL LYMPH NODE BIOPSY
Anesthesia: General | Site: Breast | Laterality: Left

## 2020-11-15 MED ORDER — SODIUM CHLORIDE (PF) 0.9 % IJ SOLN
INTRAMUSCULAR | Status: AC
  Filled 2020-11-15: qty 10

## 2020-11-15 MED ORDER — TECHNETIUM TC 99M TILMANOCEPT KIT
1.0000 | PACK | Freq: Once | INTRAVENOUS | Status: AC | PRN
  Administered 2020-11-15: 1 via INTRADERMAL

## 2020-11-15 MED ORDER — LIDOCAINE HCL (CARDIAC) PF 100 MG/5ML IV SOSY
PREFILLED_SYRINGE | INTRAVENOUS | Status: DC | PRN
  Administered 2020-11-15: 40 mg via INTRATRACHEAL

## 2020-11-15 MED ORDER — BUPIVACAINE LIPOSOME 1.3 % IJ SUSP
INTRAMUSCULAR | Status: DC | PRN
  Administered 2020-11-15: 10 mL

## 2020-11-15 MED ORDER — HYDROMORPHONE HCL 1 MG/ML IJ SOLN: 0.2500 mg | INTRAMUSCULAR | Status: DC | PRN

## 2020-11-15 MED ORDER — HYDROCODONE-ACETAMINOPHEN 5-325 MG PO TABS: 1.0000 | ORAL_TABLET | Freq: Four times a day (QID) | ORAL | 0 refills | Status: DC | PRN

## 2020-11-15 MED ORDER — MIDAZOLAM HCL 2 MG/2ML IJ SOLN
INTRAMUSCULAR | Status: AC
  Filled 2020-11-15: qty 2

## 2020-11-15 MED ORDER — DEXAMETHASONE SODIUM PHOSPHATE 10 MG/ML IJ SOLN
INTRAMUSCULAR | Status: DC | PRN
  Administered 2020-11-15: 10 mg via INTRAVENOUS

## 2020-11-15 MED ORDER — PROPOFOL 10 MG/ML IV BOLUS
INTRAVENOUS | Status: DC | PRN
  Administered 2020-11-15: 20 mg via INTRAVENOUS
  Administered 2020-11-15: 150 mg via INTRAVENOUS

## 2020-11-15 MED ORDER — CHLORHEXIDINE GLUCONATE CLOTH 2 % EX PADS: 6.0000 | MEDICATED_PAD | Freq: Once | CUTANEOUS | Status: DC

## 2020-11-15 MED ORDER — IBUPROFEN 800 MG PO TABS: 800.0000 mg | ORAL_TABLET | Freq: Three times a day (TID) | ORAL | 0 refills | Status: DC | PRN

## 2020-11-15 MED ORDER — VANCOMYCIN HCL 500 MG IV SOLR
INTRAVENOUS | Status: AC
  Filled 2020-11-15: qty 500

## 2020-11-15 MED ORDER — CEFAZOLIN SODIUM-DEXTROSE 2-4 GM/100ML-% IV SOLN
2.0000 g | INTRAVENOUS | Status: AC
  Administered 2020-11-15: 2 g via INTRAVENOUS

## 2020-11-15 MED ORDER — LIDOCAINE HCL (PF) 2 % IJ SOLN
INTRAMUSCULAR | Status: AC
  Filled 2020-11-15: qty 5

## 2020-11-15 MED ORDER — FENTANYL CITRATE (PF) 100 MCG/2ML IJ SOLN
100.0000 ug | Freq: Once | INTRAMUSCULAR | Status: AC
  Administered 2020-11-15: 100 ug via INTRAVENOUS

## 2020-11-15 MED ORDER — CEFAZOLIN SODIUM-DEXTROSE 2-4 GM/100ML-% IV SOLN
INTRAVENOUS | Status: AC
  Filled 2020-11-15: qty 100

## 2020-11-15 MED ORDER — PROPOFOL 10 MG/ML IV BOLUS
INTRAVENOUS | Status: AC
  Filled 2020-11-15: qty 20

## 2020-11-15 MED ORDER — LACTATED RINGERS IV SOLN: INTRAVENOUS | Status: DC

## 2020-11-15 MED ORDER — ACETAMINOPHEN 500 MG PO TABS
1000.0000 mg | ORAL_TABLET | ORAL | Status: AC
  Administered 2020-11-15: 1000 mg via ORAL

## 2020-11-15 MED ORDER — MIDAZOLAM HCL 2 MG/2ML IJ SOLN
2.0000 mg | Freq: Once | INTRAMUSCULAR | Status: AC
  Administered 2020-11-15: 2 mg via INTRAVENOUS

## 2020-11-15 MED ORDER — BUPIVACAINE-EPINEPHRINE (PF) 0.5% -1:200000 IJ SOLN
INTRAMUSCULAR | Status: DC | PRN
  Administered 2020-11-15: 20 mL

## 2020-11-15 MED ORDER — SODIUM CHLORIDE 0.9 % IV SOLN
INTRAVENOUS | Status: DC | PRN
  Administered 2020-11-15: 600 mL

## 2020-11-15 MED ORDER — FENTANYL CITRATE (PF) 100 MCG/2ML IJ SOLN
INTRAMUSCULAR | Status: AC
  Filled 2020-11-15: qty 2

## 2020-11-15 MED ORDER — VANCOMYCIN HCL 500 MG IV SOLR
INTRAVENOUS | Status: DC | PRN
  Administered 2020-11-15: 500 mg via TOPICAL

## 2020-11-15 MED ORDER — METHYLENE BLUE 0.5 % INJ SOLN
INTRAVENOUS | Status: AC
  Filled 2020-11-15: qty 10

## 2020-11-15 MED ORDER — ONDANSETRON HCL 4 MG/2ML IJ SOLN
INTRAMUSCULAR | Status: AC
  Filled 2020-11-15: qty 2

## 2020-11-15 MED ORDER — BUPIVACAINE-EPINEPHRINE (PF) 0.25% -1:200000 IJ SOLN
INTRAMUSCULAR | Status: DC | PRN
  Administered 2020-11-15: 26 mL

## 2020-11-15 MED ORDER — FENTANYL CITRATE (PF) 100 MCG/2ML IJ SOLN
INTRAMUSCULAR | Status: DC | PRN
  Administered 2020-11-15: 50 ug via INTRAVENOUS
  Administered 2020-11-15 (×2): 25 ug via INTRAVENOUS

## 2020-11-15 MED ORDER — SODIUM CHLORIDE 0.9 % IV SOLN
INTRAVENOUS | Status: AC
  Filled 2020-11-15: qty 10

## 2020-11-15 MED ORDER — DEXAMETHASONE SODIUM PHOSPHATE 10 MG/ML IJ SOLN
INTRAMUSCULAR | Status: AC
  Filled 2020-11-15: qty 1

## 2020-11-15 MED ORDER — ACETAMINOPHEN 500 MG PO TABS
ORAL_TABLET | ORAL | Status: AC
  Filled 2020-11-15: qty 2

## 2020-11-15 MED ORDER — ONDANSETRON HCL 4 MG/2ML IJ SOLN
INTRAMUSCULAR | Status: DC | PRN
  Administered 2020-11-15: 4 mg via INTRAVENOUS

## 2020-11-15 SURGICAL SUPPLY — 51 items
ADH SKN CLS APL DERMABOND .7 (GAUZE/BANDAGES/DRESSINGS) ×1
APL PRP STRL LF DISP 70% ISPRP (MISCELLANEOUS) ×1
APPLIER CLIP 9.375 MED OPEN (MISCELLANEOUS) ×2
APR CLP MED 9.3 20 MLT OPN (MISCELLANEOUS) ×1
BINDER BREAST XLRG (GAUZE/BANDAGES/DRESSINGS) ×2 IMPLANT
BLADE SURG 15 STRL LF DISP TIS (BLADE) ×1 IMPLANT
BLADE SURG 15 STRL SS (BLADE) ×2
CANISTER SUC SOCK COL 7IN (MISCELLANEOUS) IMPLANT
CANISTER SUCT 1200ML W/VALVE (MISCELLANEOUS) ×2 IMPLANT
CHLORAPREP W/TINT 26 (MISCELLANEOUS) ×2 IMPLANT
CLIP APPLIE 9.375 MED OPEN (MISCELLANEOUS) ×1 IMPLANT
COVER BACK TABLE 60X90IN (DRAPES) ×2 IMPLANT
COVER MAYO STAND STRL (DRAPES) ×2 IMPLANT
COVER PROBE W GEL 5X96 (DRAPES) ×2 IMPLANT
DECANTER SPIKE VIAL GLASS SM (MISCELLANEOUS) IMPLANT
DERMABOND ADVANCED (GAUZE/BANDAGES/DRESSINGS) ×1
DERMABOND ADVANCED .7 DNX12 (GAUZE/BANDAGES/DRESSINGS) ×1 IMPLANT
DRAPE LAPAROSCOPIC ABDOMINAL (DRAPES) ×2 IMPLANT
DRAPE UTILITY XL STRL (DRAPES) ×2 IMPLANT
ELECT COATED BLADE 2.86 ST (ELECTRODE) ×2 IMPLANT
ELECT REM PT RETURN 9FT ADLT (ELECTROSURGICAL) ×2
ELECTRODE REM PT RTRN 9FT ADLT (ELECTROSURGICAL) ×1 IMPLANT
GLOVE SRG 8 PF TXTR STRL LF DI (GLOVE) ×1 IMPLANT
GLOVE SURG ENC MOIS LTX SZ6 (GLOVE) ×2 IMPLANT
GLOVE SURG LTX SZ8 (GLOVE) ×2 IMPLANT
GLOVE SURG POLYISO LF SZ6.5 (GLOVE) ×2 IMPLANT
GLOVE SURG UNDER POLY LF SZ6.5 (GLOVE) ×2 IMPLANT
GLOVE SURG UNDER POLY LF SZ7 (GLOVE) ×2 IMPLANT
GLOVE SURG UNDER POLY LF SZ8 (GLOVE) ×2
GOWN STRL REUS W/ TWL LRG LVL3 (GOWN DISPOSABLE) ×2 IMPLANT
GOWN STRL REUS W/ TWL XL LVL3 (GOWN DISPOSABLE) ×1 IMPLANT
GOWN STRL REUS W/TWL LRG LVL3 (GOWN DISPOSABLE) ×4
GOWN STRL REUS W/TWL XL LVL3 (GOWN DISPOSABLE) ×2
HEMOSTAT ARISTA ABSORB 3G PWDR (HEMOSTASIS) ×2 IMPLANT
HEMOSTAT SNOW SURGICEL 2X4 (HEMOSTASIS) IMPLANT
KIT MARKER MARGIN INK (KITS) ×2 IMPLANT
NDL SAFETY ECLIPSE 18X1.5 (NEEDLE) IMPLANT
NEEDLE HYPO 18GX1.5 SHARP (NEEDLE)
NEEDLE HYPO 25X1 1.5 SAFETY (NEEDLE) ×2 IMPLANT
NS IRRIG 1000ML POUR BTL (IV SOLUTION) ×2 IMPLANT
PACK BASIN DAY SURGERY FS (CUSTOM PROCEDURE TRAY) ×2 IMPLANT
PENCIL SMOKE EVACUATOR (MISCELLANEOUS) ×2 IMPLANT
SLEEVE SCD COMPRESS KNEE MED (STOCKING) ×2 IMPLANT
SPONGE T-LAP 4X18 ~~LOC~~+RFID (SPONGE) ×2 IMPLANT
SUT MNCRL AB 4-0 PS2 18 (SUTURE) ×2 IMPLANT
SUT VICRYL 3-0 CR8 SH (SUTURE) ×2 IMPLANT
SYR CONTROL 10ML LL (SYRINGE) ×2 IMPLANT
TOWEL GREEN STERILE FF (TOWEL DISPOSABLE) ×2 IMPLANT
TRAY FAXITRON CT DISP (TRAY / TRAY PROCEDURE) ×2 IMPLANT
TUBE CONNECTING 20X1/4 (TUBING) ×2 IMPLANT
YANKAUER SUCT BULB TIP NO VENT (SUCTIONS) ×2 IMPLANT

## 2020-11-15 NOTE — Anesthesia Procedure Notes (Signed)
Procedure Name: LMA Insertion Date/Time: 11/15/2020 7:38 AM Performed by: Glory Buff, CRNA Pre-anesthesia Checklist: Patient identified, Emergency Drugs available, Suction available and Patient being monitored Patient Re-evaluated:Patient Re-evaluated prior to induction Oxygen Delivery Method: Circle system utilized Preoxygenation: Pre-oxygenation with 100% oxygen Induction Type: IV induction LMA: LMA inserted LMA Size: 4.0 Number of attempts: 1 Placement Confirmation: positive ETCO2 Tube secured with: Tape Dental Injury: Teeth and Oropharynx as per pre-operative assessment

## 2020-11-15 NOTE — Anesthesia Postprocedure Evaluation (Signed)
Anesthesia Post Note  Patient: Valerie [name]  Procedure(s) Performed: LEFT BREAST LUMPECTOMY WITH RADIOACTIVE SEED AND LEFT SENTINEL LYMPH NODE BIOPSY (Left: Breast)     Patient location during evaluation: PACU Anesthesia Type: General Level of consciousness: awake and alert and oriented Pain management: pain level controlled Vital Signs Assessment: post-procedure vital signs reviewed and stable Respiratory status: spontaneous breathing, nonlabored ventilation and respiratory function stable Cardiovascular status: blood pressure returned to baseline Postop Assessment: no apparent nausea or vomiting Anesthetic complications: no   No notable events documented.  Last Vitals:  Vitals:   11/15/20 0945 11/15/20 0959  BP: 120/81 124/82  Pulse: 71 70  Resp: 13 12  Temp:    SpO2: 94% 96%    Last Pain:  Vitals:   11/15/20 0945  TempSrc:   PainSc: 0-No pain                 [name]

## 2020-11-15 NOTE — Interval H&P Note (Signed)
History and Physical Interval Note:  11/15/2020 7:19 AM  Valerie [name]  has presented today for surgery, with the diagnosis of LEFT BREAST CANCER.  The various methods of treatment have been discussed with the patient and family. After consideration of risks, benefits and other options for treatment, the patient has consented to  Procedure(s): LEFT BREAST LUMPECTOMY WITH RADIOACTIVE SEED AND LEFT SENTINEL LYMPH NODE BIOPSY (Left) as a surgical intervention.  The patient's history has been reviewed, patient examined, no change in status, stable for surgery.  I have reviewed the patient's chart and labs.  Questions were answered to the patient's satisfaction.     [place]

## 2020-11-15 NOTE — Progress Notes (Signed)
Assisted Dr. Oren Bracket with left, ultrasound guided, pectoralis block. Side rails up, monitors on throughout procedure. See vital signs in flow sheet. Tolerated Procedure well.

## 2020-11-15 NOTE — Anesthesia Preprocedure Evaluation (Addendum)
Anesthesia Evaluation  Patient identified by MRN, date of birth, ID band Patient awake    Reviewed: Allergy & Precautions, H&P , NPO status , Patient's Chart, lab work & pertinent test results  Airway Mallampati: I  TM Distance: >3 FB Neck ROM: Full    Dental no notable dental hx. (+) Teeth Intact, Dental Advisory Given   Pulmonary neg pulmonary ROS,    Pulmonary exam normal breath sounds clear to auscultation       Cardiovascular negative cardio ROS   Rhythm:Regular Rate:Normal     Neuro/Psych negative neurological ROS  negative psych ROS   GI/Hepatic negative GI ROS, Neg liver ROS,   Endo/Other  negative endocrine ROSHypothyroidism   Renal/GU negative Renal ROS  negative genitourinary   Musculoskeletal   Abdominal   Peds  Hematology negative hematology ROS (+)   Anesthesia Other Findings   Reproductive/Obstetrics negative OB ROS                            Anesthesia Physical Anesthesia Plan  ASA: 2  Anesthesia Plan: General   Post-op Pain Management:  Regional for Post-op pain   Induction: Intravenous  PONV Risk Score and Plan: 4 or greater and Ondansetron, Dexamethasone and Midazolam  Airway Management Planned: LMA  Additional Equipment:   Intra-op Plan:   Post-operative Plan: Extubation in OR  Informed Consent: I have reviewed the patients History and Physical, chart, labs and discussed the procedure including the risks, benefits and alternatives for the proposed anesthesia with the patient or authorized representative who has indicated his/her understanding and acceptance.     Dental advisory given  Plan Discussed with: CRNA  Anesthesia Plan Comments:         Anesthesia Quick Evaluation

## 2020-11-15 NOTE — Op Note (Signed)
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  Of note there is no clip placed at her initial biopsy.  Also additional margins were sent.  All margins were inked as was the specimen.  Drains: None  Indications for procedure: The patient is a pleasant 50 year old female presenting for breast conserving surgery secondary to left breast cancer.  She opted for breast conservation after being seen by the multidisciplinary clinic team and all of her surgical and medical as well as radiation options reviewed.  Risk, benefits and long-term expectations and recovery were discussed.The procedure has been discussed with the patient. Alternatives to surgery have been discussed with the patient.  Risks of surgery include bleeding,  Infection,  Seroma formation, death,  and the need for further surgery.   The patient understands and wishes to proceed. Sentinel lymph node mapping and dissection has been discussed with the patient.  Risk of bleeding,  Infection,  Seroma formation, lymphedema additional procedures,,  Shoulder weakness ,  Shoulder stiffness,  Nerve and blood vessel injury and reaction to the mapping dyes have been discussed.  Alternatives to surgery have been discussed with the patient.  The patient agrees to proceed.    Description of procedure: The patient was met in the holding area and questions were answered.  Neoprobe used identify seed left breast upper inner quadrant.  Films available for review.  Of note there is no clip since she became syncopal during her initial biopsy.  All questions were answered.  Is taken back to  the operative room.  She is placed supine upon the OR table.  After induction of LMA anesthesia, left breast was prepped and draped in a sterile fashion and timeout performed.  Proper patient, site and procedure were verified.  Neoprobe was used and the seed was identified left breast upper inner quadrant.  Local anesthetic was infiltrated over this area.  Incision was made over the medial left breast in a transverse orientation.  Dissection was carried down after elevating small skin flaps.  All tissue around the seed was excised with a grossly negative margin.  Medial margin was close.  Given the fact she did not have a clip in place, I felt that shaving all of her margins would be appropriate which I did.  These were oriented.  The deep margin was pectoralis muscle therefore there was no deep margin.  The cavities made hemostatic.  Irrigation was used.  Cautery used to control any small bleeding vessels.  Clips were placed.  Vancomycin powder placed.  Wound closed with a deep layer 3-0 Vicryl and 4 Monocryl for a subcuticular stitch was performed.  The neoprobe settings were changed to technetium.  Hotspot identified in the left axilla.  Incision was made of about 3 cm.  Dissection was carried down into the level 1 axillary contents.  Dissection revealed a hot node.  This was dissected out which was a level 1 node.  It was removed.  Background counts then approached 0.  Irrigation was used.  The long thoracic nerve, thoracodorsal trunk and axillary vein were preserved.  Irrigation was used.  Arista was placed as well as vancomycin powder.  There is excellent hemostasis.  Deep layer closed with 3-0 Vicryl.  4 Monocryl used to approximate the skin in a subcuticular fashion.  Dermabond was placed on both incisions.  Breast binder placed.  All counts were found to be correct.  The patient was awoke extubated taken to recovery in satisfactory condition.

## 2020-11-15 NOTE — H&P (Signed)
REFERRING PHYSICIAN: Self  PROVIDER: Kennieth Francois, MD  MRN: [id number] DOB: October 22, 1970 DATE OF ENCOUNTER: 10/24/2020  Subjective   Chief Complaint: No chief complaint on file.   History of Present Illness: Valerie [name] is a 50 y.o. female who is seen today as an office consultation at the request of Dr. Lindi Adie for evaluation of left breast cancer.   Patient seen in the [facility] due to left breast mammographic abnormality noted on recent screening mammogram. 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 She has no family history of breast cancer. 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 Denies any history of breast pain or nipple discharge. She is sure at the biopsy site of the left. She did have a single episode with Corp. biopsy and needs to return for further clip placement hopefully in the next few days.  Review of Systems: A complete review of systems was obtained from the patient. I have reviewed this information and discussed as appropriate with the patient. See HPI as well for other ROS.  Review of Systems  Constitutional: Positive for diaphoresis.  HENT: Negative.  Eyes: Positive for blurred vision.  Respiratory: Negative.  Cardiovascular: Negative. Negative for palpitations.  Gastrointestinal: Negative.  Genitourinary: Negative.  Musculoskeletal: Negative.  Skin: Negative.  Neurological: Negative.  Endo/Heme/Allergies: Bruises/bleeds easily.  Psychiatric/Behavioral: Negative.    Medical History: History reviewed. No pertinent past medical history.  There is no problem list on file for this patient.  History reviewed. No pertinent surgical history.   Not on File  No current outpatient medications on file prior to visit.    No current facility-administered medications on file prior to visit.   Family History  Problem Relation Age of Onset   No Known Problems Mother   No Known Problems Father   No Known Problems Sister   No Known Problems Brother    Social History   Tobacco Use  Smoking Status Never Smoker  Smokeless Tobacco Not on file    Social History   Socioeconomic History   Marital status: Married  Tobacco Use   Smoking status: Never Smoker  Substance and Sexual Activity   Alcohol use: Not Currently   Drug use: Not Currently   Objective:   There were no vitals filed for this visit.  There is no height or weight on file to calculate BMI.  Physical Exam Nursing note reviewed. Exam conducted with a chaperone present.  Constitutional:  Appearance: Normal appearance.  HENT:  Head: Normocephalic and atraumatic.  Mouth/Throat:  Mouth: Mucous membranes are moist.  Eyes:  Extraocular Movements: Extraocular movements intact.  Pupils: Pupils are equal, round, and reactive to light.  Cardiovascular:  Rate and Rhythm: Normal rate and regular rhythm.  Pulmonary:  Effort: Pulmonary effort is normal.  Breath sounds: Normal breath sounds.  Chest:  Breasts:  Right: No axillary adenopathy or supraclavicular adenopathy.  Left: No axillary adenopathy or supraclavicular adenopathy.   Abdominal:  General: Abdomen is flat.  Palpations: Abdomen is soft.  Musculoskeletal:  General: Normal range of motion.  Cervical back: Normal range of motion.  Lymphadenopathy:  Upper Body:  Right upper body: No supraclavicular, axillary or pectoral adenopathy.  Left upper body: No supraclavicular, axillary or pectoral adenopathy.  Neurological:  General: No  focal deficit present.  Mental Status: She is alert.  Psychiatric:  Mood and Affect: Mood normal.  Behavior: Behavior normal.     Labs, Imaging and Diagnostic Testing: Core biopsy shows grade 1 invasive ductal carcinoma ER positive PR  positive HER2/neu negative. Mammogram shows a 6 mm area of distortion left breast lower outer quadrant with calcifications  Assessment and Plan:  Diagnoses and all orders for this visit:  Malignant neoplasm of lower-outer quadrant of left breast of female, estrogen receptor positive (CMS-HCC)   Discussed breast conserving surgery with sentinel lymph node mapping as well as mastectomy 3 construction. Pros and cons of both were discussed. She is opted for left breast seed localized lumpectomy with left axillary sentinel lymph node mapping. Risks and benefits of surgery discussed. Risk of bleeding, infection, dye reaction, bruising, cosmetic deformity, the need for further surgery, reexcision, arm numbness, arm stiffness, lymphedema, DVT, and cardiovascular events discussed.  Return in about 4 weeks (around 11/21/2020).  Kennieth Francois, MD   total time 45 minutes for face-to-face, exam, documentation, care coordination, review of x-rays, review of pathology report

## 2020-11-15 NOTE — Transfer of Care (Signed)
Immediate Anesthesia Transfer of Care Note  Patient: Valerie [name]  Procedure(s) Performed: LEFT BREAST LUMPECTOMY WITH RADIOACTIVE SEED AND LEFT SENTINEL LYMPH NODE BIOPSY (Left: Breast)  Patient Location: PACU  Anesthesia Type:General  Level of Consciousness: drowsy and patient cooperative  Airway & Oxygen Therapy: Patient Spontanous Breathing and Patient connected to face mask oxygen  Post-op Assessment: Report given to RN and Post -op Vital signs reviewed and stable  Post vital signs: Reviewed and stable  Last Vitals:  Vitals Value Taken Time  BP 111/64 11/15/20 0908  Temp    Pulse 72 11/15/20 0908  Resp 11 11/15/20 0908  SpO2 95 % 11/15/20 0908  Vitals shown include unvalidated device data.  Last Pain:  Vitals:   11/15/20 0643  TempSrc: Oral  PainSc: 0-No pain         Complications: No notable events documented.

## 2020-11-15 NOTE — Discharge Instructions (Addendum)
[place] Office Phone Number [phone]  BREAST BIOPSY/ PARTIAL MASTECTOMY: POST OP INSTRUCTIONS  Always review your discharge instruction sheet given to you by the facility where your surgery was performed.  IF YOU HAVE DISABILITY OR FAMILY LEAVE FORMS, YOU MUST BRING THEM TO THE OFFICE FOR PROCESSING.  DO NOT GIVE THEM TO YOUR DOCTOR.  A prescription for pain medication may be given to you upon discharge.  Take your pain medication as prescribed, if needed.  If narcotic pain medicine is not needed, then you may take acetaminophen (Tylenol) or ibuprofen (Advil) as needed. Take your usually prescribed medications unless otherwise directed If you need a refill on your pain medication, please contact your pharmacy.  They will contact our office to request authorization.  Prescriptions will not be filled after 5pm or on week-ends. You should eat very light the first 24 hours after surgery, such as soup, crackers, pudding, etc.  Resume your normal diet the day after surgery. Most patients will experience some swelling and bruising in the breast.  Ice packs and a good support bra will help.  Swelling and bruising can take several days to resolve.  It is common to experience some constipation if taking pain medication after surgery.  Increasing fluid intake and taking a stool softener will usually help or prevent this problem from occurring.  A mild laxative (Milk of Magnesia or Miralax) should be taken according to package directions if there are no bowel movements after 48 hours. Unless discharge instructions indicate otherwise, you may remove your bandages 24-48 hours after surgery, and you may shower at that time.  You may have steri-strips (small skin tapes) in place directly over the incision.  These strips should be left on the skin for 7-10 days.  If your surgeon used skin glue on the incision, you may shower in 24 hours.  The glue will flake off over the next 2-3 weeks.  Any  sutures or staples will be removed at the office during your follow-up visit. ACTIVITIES:  You may resume regular daily activities (gradually increasing) beginning the next day.  Wearing a good support bra or sports bra minimizes pain and swelling.  You may have sexual intercourse when it is comfortable. You may drive when you no longer are taking prescription pain medication, you can comfortably wear a seatbelt, and you can safely maneuver your car and apply brakes. RETURN TO WORK:  ______________________________________________________________________________________ Dennis Bast should see your doctor in the office for a follow-up appointment approximately two weeks after your surgery.  Your doctor's nurse will typically make your follow-up appointment when she calls you with your pathology report.  Expect your pathology report 2-3 business days after your surgery.  You may call to check if you do not hear from [place] after three days. OTHER INSTRUCTIONS: _______________________________________________________________________________________________ _____________________________________________________________________________________________________________________________________ _____________________________________________________________________________________________________________________________________ _____________________________________________________________________________________________________________________________________  WHEN TO CALL YOUR DOCTOR: Fever over 101.0 Nausea and/or vomiting. Extreme swelling or bruising. Continued bleeding from incision. Increased pain, redness, or drainage from the incision.  The clinic staff is available to answer your questions during regular business hours.  Please don't hesitate to call and ask to speak to one of the nurses for clinical concerns.  If you have a medical emergency, go to the nearest emergency room or call 911.  A surgeon from [facility] is always on call at the hospital.  For further questions, please visit [email or web address]    May take Tylenol after 12:50 pm, if needed.   Post Anesthesia Home Care Instructions  Activity: Get plenty of rest for the remainder of the day. A responsible individual must stay with you for 24 hours following the procedure.  For the next 24 hours, DO NOT: -Drive a car -Paediatric nurse -Drink alcoholic beverages -Take any medication unless instructed by your physician -Make any legal decisions or sign important papers.  Meals: Start with liquid foods such as gelatin or soup. Progress to regular foods as tolerated. Avoid greasy, spicy, heavy foods. If nausea and/or vomiting occur, drink only clear liquids until the nausea and/or vomiting subsides. Call your physician if vomiting continues.  Special Instructions/Symptoms: Your throat may feel dry or sore from the anesthesia or the breathing tube placed in your throat during surgery. If this causes discomfort, gargle with warm salt water. The discomfort should disappear within 24 hours.  If you had a scopolamine patch placed behind your ear for the management of post- operative nausea and/or vomiting:  1. The medication in the patch is effective for 72 hours, after which it should be removed.  Wrap patch in a tissue and discard in the trash. Wash hands thoroughly with soap and water. 2. You may remove the patch earlier than 72 hours if you experience unpleasant side effects which may include dry mouth, dizziness or visual disturbances. 3. Avoid touching the patch. Wash your hands with soap and water after contact with the patch.    Regional Anesthesia Blocks  1. Numbness or the inability to move the "blocked" extremity may last from 3-48 hours after placement. The length of time depends on the medication injected and your individual response to the medication. If the numbness is not going away after 48 hours, call  your surgeon.  2. The extremity that is blocked will need to be protected until the numbness is gone and the  Strength has returned. Because you cannot feel it, you will need to take extra care to avoid injury. Because it may be weak, you may have difficulty moving it or using it. You may not know what position it is in without looking at it while the block is in effect.  3. For blocks in the legs and feet, returning to weight bearing and walking needs to be done carefully. You will need to wait until the numbness is entirely gone and the strength has returned. You should be able to move your leg and foot normally before you try and bear weight or walk. You will need someone to be with you when you first try to ensure you do not fall and possibly risk injury.  4. Bruising and tenderness at the needle site are common side effects and will resolve in a few days.  5. Persistent numbness or new problems with movement should be communicated to the surgeon or the [facility] [phone] [place] [phone]).

## 2020-11-15 NOTE — Anesthesia Procedure Notes (Signed)
Anesthesia Regional Block: Pectoralis block   Pre-Anesthetic Checklist: , timeout performed,  Correct Patient, Correct Site, Correct Laterality,  Correct Procedure, Correct Position, site marked,  Risks and benefits discussed,  Pre-op evaluation,  At surgeon's request and post-op pain management  Laterality: Left  Prep: Maximum Sterile Barrier Precautions used, chloraprep       Needles:  Injection technique: Single-shot  Needle Type: Echogenic Stimulator Needle     Needle Length: 9cm  Needle Gauge: 21     Additional Needles:   Procedures:,,,, ultrasound used (permanent image in chart),,    Narrative:  Start time: 11/15/2020 6:56 AM End time: 11/15/2020 7:06 AM Injection made incrementally with aspirations every 5 mL.  Performed by: Personally  Anesthesiologist: Roderic Palau, MD  Additional Notes: 2% Lidocaine skin wheel.

## 2020-11-16 ENCOUNTER — Encounter: Payer: Self-pay | Admitting: Physical Therapy

## 2020-11-16 ENCOUNTER — Encounter (HOSPITAL_BASED_OUTPATIENT_CLINIC_OR_DEPARTMENT_OTHER): Payer: Self-pay | Admitting: Surgery

## 2020-11-20 ENCOUNTER — Encounter: Payer: Self-pay | Admitting: Surgery

## 2020-11-20 ENCOUNTER — Encounter: Payer: Self-pay | Admitting: *Deleted

## 2020-11-20 LAB — SURGICAL PATHOLOGY

## 2020-11-22 NOTE — Progress Notes (Signed)
Patient Care Team: Carolee Rota, NP as PCP - General (Nurse Practitioner) Sueanne Margarita, MD as PCP - Cardiology (Cardiology) Mauro Kaufmann, RN as Oncology Nurse Navigator Rockwell Germany, RN as Oncology Nurse Navigator Erroll Luna, MD as Consulting Physician (General Surgery) Nicholas Lose, MD as Consulting Physician (Hematology and Oncology) Kyung Rudd, MD as Consulting Physician (Radiation Oncology)  DIAGNOSIS:    ICD-10-CM   1. Malignant neoplasm of lower-inner quadrant of left breast in female, estrogen receptor positive ([place])  C50.312    Z17.0       SUMMARY OF ONCOLOGIC HISTORY: Oncology History  Malignant neoplasm of lower-inner quadrant of left breast in female, estrogen receptor positive ([place])  10/23/2020 Initial Diagnosis   Screening mammogram on 09/11/20 showed possible distortion with calcifications in the left breast. Diagnostic mammogram and [place] on 10/03/20 showed 0.6 cm group of indeterminate lower inner left breast calcifications with possible associated subtle distortion and no abnormal appearing left axillary lymph nodes. Biopsy on 10/17/20 showed invasive ductal carcinoma, DCIS with calcifications, ER+(90%)/PR(95%)/Ki67(<5%).   10/24/2020 Cancer Staging   Staging form: Breast, AJCC 8th Edition - Clinical stage from 10/24/2020: Stage IA (cT1b, cN0, cM0, G1, ER+, PR+, HER2-) - Signed by Nicholas Lose, MD on 10/24/2020 Stage prefix: Initial diagnosis Histologic grading system: 3 grade system Laterality: Left Staged by: Pathologist and managing physician Stage used in treatment planning: Yes National guidelines used in treatment planning: Yes Type of national guideline used in treatment planning: NCCN   11/15/2020 Surgery   Left lumpectomy: No residual cancer, fibrocystic change, margins negative, 0/3 lymph nodes negative (based on the biopsy tumor size: 2 mm), previous ER 90%, PR 95%, HER2 negative, Ki-67 less than 5%     CHIEF COMPLIANT: Follow-up of  left breast cancer  INTERVAL HISTORY: Valerie [name] is a 50 y.o. with above-mentioned history of left breast cancer. She underwent a left lumpectomy with Dr. Roderic Palau on 11/15/20 for which the pathology showed no residual carcinoma with margins negative for carcinoma and 3 out of 3 left axillary lymph nodes negative for carcinoma. She presents to the clinic today for follow-up.   ALLERGIES:  has No Known Allergies.  MEDICATIONS:  Current Outpatient Medications  Medication Sig Dispense Refill   Ascorbic Acid (VITA-C PO) Take 1,000 mg by mouth daily.     cholecalciferol (VITAMIN D3) 25 MCG (1000 UNIT) tablet Take 5,000 Units by mouth daily.     HYDROcodone-acetaminophen (NORCO/VICODIN) 5-325 MG tablet Take 1 tablet by mouth every 6 (six) hours as needed for moderate pain. 15 tablet 0   ibuprofen (ADVIL) 800 MG tablet Take 1 tablet (800 mg total) by mouth every 8 (eight) hours as needed. 30 tablet 0   levothyroxine (SYNTHROID) 100 MCG tablet Take 100 mcg by mouth daily.     predniSONE (DELTASONE) 5 MG tablet Take up to 3 pills daily as directed (Patient taking differently: 10 mg. Every other day) 100 tablet 3   Probiotic Product (CULTRELLE KIDS IMMUNE DEFENSE PO) Take 1 tablet by mouth daily.     zinc gluconate 50 MG tablet Take 50 mg by mouth daily.     No current facility-administered medications for this visit.    PHYSICAL EXAMINATION: ECOG PERFORMANCE STATUS: 1 - Symptomatic but completely ambulatory  There were no vitals filed for this visit. There were no vitals filed for this visit.    LABORATORY DATA:  I have reviewed the data as listed CMP Latest Ref Rng & Units 10/24/2020 07/05/2020 05/06/2020  Glucose 70 - 99 mg/dL 87 103(H) 97  BUN 6 - 20 mg/dL 23(H) 16 13  Creatinine 0.44 - 1.00 mg/dL 0.97 0.92 1.05(H)  Sodium 135 - 145 mmol/L 143 137 138  Potassium 3.5 - 5.1 mmol/L 4.6 4.8 4.5  Chloride 98 - 111 mmol/L 109 98 105  CO2 22 - 32 mmol/L 25 25 24   Calcium 8.9 -  10.3 mg/dL 9.0 9.2 8.6(L)  Total Protein 6.5 - 8.1 g/dL 6.8 - 6.7  Total Bilirubin 0.3 - 1.2 mg/dL 0.3 - 0.4  Alkaline Phos 38 - 126 U/L 41 - 41  AST 15 - 41 U/L 17 - 15  ALT 0 - 44 U/L 16 - 27    Lab Results  Component Value Date   WBC 6.8 10/24/2020   HGB 14.6 10/24/2020   HCT 42.6 10/24/2020   MCV 94.9 10/24/2020   PLT 256 10/24/2020   NEUTROABS 4.8 10/24/2020    ASSESSMENT & PLAN:  Malignant neoplasm of lower-inner quadrant of left breast in female, estrogen receptor positive ([place]) 11/15/2020:Left lumpectomy: No residual cancer, fibrocystic change, margins negative, 0/3 lymph nodes negative (based on the biopsy tumor size: 2 mm), previous ER 90%, PR 95%, HER2 negative, Ki-67 less than 5%  Pathology counseling: I discussed the final pathology report of the patient provided  a copy of this report. I discussed the margins as well as lymph node surgeries. We also discussed the final staging along with previously performed ER/PR and HER-2/neu testing.  Treatment plan: 1.  Adjuvant radiation therapy 2. followed by adjuvant antiestrogen therapy  Return to clinic after radiation therapy to start antiestrogens.    No orders of the defined types were placed in this encounter.  The patient has a good understanding of the overall plan. she agrees with it. she will call with any problems that may develop before the next visit here.  Total time spent: 20 mins including face to face time and time spent for planning, charting and coordination of care  Rulon Eisenmenger, MD, MPH 11/23/2020  I, Valerie [name], am acting as scribe for Dr. Nicholas Lose.  I have reviewed the above documentation for accuracy and completeness, and I agree with the above.

## 2020-11-23 ENCOUNTER — Other Ambulatory Visit: Payer: Self-pay

## 2020-11-23 ENCOUNTER — Inpatient Hospital Stay: Payer: Commercial Managed Care - PPO | Attending: Hematology and Oncology | Admitting: Hematology and Oncology

## 2020-11-23 DIAGNOSIS — Z170 Estrogen receptor positive status [ER+]: Secondary | ICD-10-CM | POA: Insufficient documentation

## 2020-11-23 DIAGNOSIS — C50312 Malignant neoplasm of lower-inner quadrant of left female breast: Secondary | ICD-10-CM | POA: Insufficient documentation

## 2020-11-23 NOTE — Assessment & Plan Note (Signed)
11/15/2020:Left lumpectomy: No residual cancer, fibrocystic change, margins negative, 0/3 lymph nodes negative (based on the biopsy tumor size: 2 mm), previous ER 90%, PR 95%, HER2 negative, Ki-67 less than 5%  Pathology counseling: I discussed the final pathology report of the patient provided  a copy of this report. I discussed the margins as well as lymph node surgeries. We also discussed the final staging along with previously performed ER/PR and HER-2/neu testing.  Treatment plan: 1.  Adjuvant radiation therapy 2. followed by adjuvant antiestrogen therapy  Return to clinic after radiation therapy to start antiestrogens.

## 2020-11-26 ENCOUNTER — Encounter: Payer: Self-pay | Admitting: *Deleted

## 2020-12-06 ENCOUNTER — Ambulatory Visit: Payer: Commercial Managed Care - PPO | Attending: Surgery | Admitting: Physical Therapy

## 2020-12-06 ENCOUNTER — Other Ambulatory Visit: Payer: Self-pay

## 2020-12-06 ENCOUNTER — Encounter: Payer: Self-pay | Admitting: Physical Therapy

## 2020-12-06 DIAGNOSIS — Z483 Aftercare following surgery for neoplasm: Secondary | ICD-10-CM | POA: Insufficient documentation

## 2020-12-06 NOTE — Therapy (Signed)
[place], [place], [address] Phone: [phone]   Fax:  [phone]  Physical Therapy Treatment  Patient Details  Name: Valerie [name] MRN: [id number] Date of Birth: [birth year] Referring Provider (PT): Dr. Erroll Luna   Encounter Date: 12/06/2020   PT End of Session - 12/06/20 1723     Visit Number 2    Number of Visits 10    Date for PT Re-Evaluation 01/04/21    PT Start Time 1600    PT Stop Time 2446    PT Time Calculation (min) 50 min    Activity Tolerance Patient tolerated treatment well    Behavior During Therapy [facility] for tasks assessed/performed             Past Medical History:  Diagnosis Date   Cancer ([place])    left breast   Hashimoto's disease    Hypothyroidism    Mitral regurgitation    mild by echo 06/2020   Myelin oligodendrocyte glycoprotein antibody disorder (MOGAD) ([place])    Optic neuritis     Past Surgical History:  Procedure Laterality Date   BIOPSY BREAST  10/17/2020   no results yet   BREAST LUMPECTOMY WITH RADIOACTIVE SEED AND SENTINEL LYMPH NODE BIOPSY Left 11/15/2020   Procedure: LEFT BREAST LUMPECTOMY WITH RADIOACTIVE SEED AND LEFT SENTINEL LYMPH NODE BIOPSY;  Surgeon: Erroll Luna, MD;  Location: [facility];  Service: General;  Laterality: Left;   TUBAL LIGATION  02/2012    There were no vitals filed for this visit.   Subjective Assessment - 12/06/20 1607     Subjective Pt states her arm is partailly numb at the back side, and under arm at axilla is hurts and is numb and at back of axilla.  She is very sensitive to wearing anything on her arm. She tried to wear a sleeve on her shirt  and was not able. She woke up last night with burning pain in her arm. She thinks it might be getting a little worse, definitly not better as she has hoped it would. Pt is back to working full time. She has appointments to talk about radiation in the next weeks.     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 It measures 6 mm in the lower inner quadrant. it is ER/PR positive and HER2 negaitve with a Ki67 of < 5%. 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 She is treated with prednisone which impacts fluid retention.  She had left lumpectomy on 11/15/2020 with 0/3 lymph nodes removed. She says that she has a problem with passing out with medical things but can let us know if she feels this might happen.    Patient Stated Goals get rid of the arm discomfort    Currently in Pain? Yes    Pain Score --   did not rate   Pain Location Axilla    Pain Orientation Left    Pain Descriptors / Indicators Sore    Pain Type Surgical pain    Pain Radiating Towards back and arm    Pain Onset 1 to 4 weeks ago    Pain Frequency Intermittent    Aggravating Factors  anything touching her arm    Pain Relieving Factors feels better with pillow under her arm  [facility] PT Assessment - 12/06/20 0001       Assessment   Medical Diagnosis Left breast cancer    Referring Provider (PT) Dr. Marcello Moores Cornett    Onset Date/Surgical Date 09/11/20      Prior Function   Level of Independence Independent      Observation/Other Assessments   Observations healing incsions on left breast and axilla with no red areas      AROM   Left Shoulder Flexion 150 Degrees    Left Shoulder ABduction 155 Degrees               LYMPHEDEMA/ONCOLOGY QUESTIONNAIRE - 12/06/20 0001       Left Upper Extremity Lymphedema   10 cm Proximal to Olecranon Process 31 cm    Olecranon Process 25 cm    15 cm Proximal to Ulnar Styloid Process 24 cm    10 cm Proximal to Ulnar Styloid Process 21.7 cm    Just Proximal to Ulnar Styloid Process 14.5 cm    Across Hand at [employer] 18 cm    At [place] of 2nd Digit 6.2 cm                         [facility] Adult PT Treatment/Exercise - 12/06/20 0001       Shoulder Exercises: Supine   Flexion AAROM;Both;5 reps   with dowel   ABduction AAROM;Both;5 reps   with dowel     Manual Therapy   Manual Therapy Manual Lymphatic Drainage (MLD);Edema management;Passive ROM    Manual therapy comments provide piece of thin foam to wear at back inside bra to cover posterior axillary area.  Medium TG soft with deep fold to elbow to wear for comfort.    Manual Lymphatic Drainage (MLD) short neck, deep breathing, right axillary nodes. anterior interaxillary anastamosis, left shoulder upper arm, then to sidlying for axilloinguinal anastamosis and extra time at fullness at posterior axilla that seemed to soften with treatment.    Passive ROM to left shoulder in ER, flexion, diagonal with stretch across elbow and wrist                         PT Long Term Goals - 12/06/20 1730       PT LONG TERM GOAL #1   Title Patient will demonstrate she has regained full shoulder ROM and function post operatively compared to baselines.    Time 8    Period Weeks    Status On-going      PT LONG TERM GOAL #2   Title Pt will report the discomfort in her arm and axilla will have decreased by 75% so that she is able to sleep through the night    Time 4    Period Weeks    Status New      PT LONG TERM GOAL #3   Title Pt will be independent in a home exercise for shoulder ROM    Time 4    Period Weeks    Status New                   Plan - 12/06/20 1723     Clinical Impression Statement Pt comes to PT about 3 weeks after surgery with burning pain, soreness and sensitivity in her left arm and feelings of fullness in her axilla and left lateral chest. She has been wearing a racer back sports bra daily and has  palpable fullness in left scapular and axillary area. She tolerated MLD to this area well today. She was given a foam patch to wear inside bra at back and  axilla and also a tg soft to arm to see if will help with densitization.  She was given information to get a [employer] for more compression to her back and lataeral chest. She will benefit from MLD , PROM and UE stretches possibly teaching her husband technique for back.    Rehab Potential Excellent    PT Frequency 2x / week    PT Duration 4 weeks    PT Treatment/Interventions ADLs/Self Care Home Management;Therapeutic exercise;Patient/family education;Manual lymph drainage;Compression bandaging;Manual techniques;Passive range of motion    PT Next Visit Plan MLD especially to posterior axilla . ( consider teaching husband or videoing on her phone to show husband how to do it)PROM, stretched, desensitizaion and nerve stretches as needed.    PT Home Exercise Plan Post op shoulder ROM HEP, dowel exercises    Consulted and Agree with Plan of Care Patient             Patient will benefit from skilled therapeutic intervention in order to improve the following deficits and impairments:  Postural dysfunction, Decreased range of motion, Decreased knowledge of precautions, Impaired UE functional use, Pain  Visit Diagnosis: Aftercare following surgery for neoplasm - Plan: PT plan of care cert/re-cert     Problem List Patient Active Problem List   Diagnosis Date Noted   Malignant neoplasm of lower-inner quadrant of left breast in female, estrogen receptor positive (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 Owens Shark PT  Norwood Levo 12/06/2020, 5:37 PM  [place], [place], [address] Phone: [phone]   Fax:  [phone]  Name:  MARIADELCARMEN CORELLA MRN: [id number] Date of Birth: 02/09/1971

## 2020-12-06 NOTE — Patient Instructions (Addendum)
  SHOULDER: Flexion - Supine (Cane)        Cancer Rehab [phone]    Hold cane in both hands. Raise arms up overhead. Do not allow back to arch. Hold _5__ seconds. Do __5-10__ times; __1-2__ times a day.   SELF ASSISTED WITH OBJECT: Shoulder Abduction / Adduction - Supine    Hold cane with both hands. Move both arms from side to side, keep elbows straight.  Hold when stretch felt for __5__ seconds. Repeat __5-10__ times; __1-2__ times a day. Once this becomes easier progress to third picture bringing affected arm towards ear by staying out to side. Same hold for _5_seconds. Repeat  _5-10_ times, _1-2_ times/day.  Shoulder Blade Stretch    Clasp fingers behind head with elbows touching in front of face. Pull elbows back while pressing shoulder blades together. Relax and hold as tolerated, can place pillow under elbow here for comfort as needed and to allow for prolonged stretch.  Repeat __5__ times. Do __1-2__ sessions per day.     First of all, check with your insurance company to see if provider is in [place] (for wigs and compression sleeves / gloves/gauntlets )   [address] [place], [place] Phone: [phone]   Fax: [phone] Will file some insurances --- call for appointment   Second to [facility] (for mastectomy prosthetics and garments) [address] [place], [place] Phone: [phone]  Fax: [phone] Will file some insurances --- call for appointment  [facility]  [address]  [place], [place] [address] [phone] Lower extremity garments  [facility] [address] [place], [place]  [address] Ward and [facility]sthetics (for compression garments, especilly for lower extremities) [address] [place], [place]  [address] [phone] Call for appointment    Jerrol Banana ,certified fitter [facility]   [phone]  [employer] (for mastectomy supplies and garments) [place] [address], [place] [address] [phone]  Other Resources: National Lymphedema Network:  www.lymphnet.org www.Klosetraining.com for patient articles and self manual lymph drainage information www.lymphedemablog.com has informative articles.  DishTag.es.com www.lymphedemaproducts.com www.brightlifedirect.com

## 2020-12-13 ENCOUNTER — Other Ambulatory Visit: Payer: Self-pay

## 2020-12-13 ENCOUNTER — Ambulatory Visit
Admission: RE | Admit: 2020-12-13 | Discharge: 2020-12-13 | Disposition: A | Discharge status: Home / Self Care | Payer: Commercial Managed Care - PPO | Source: Ambulatory Visit | Attending: Radiation Oncology | Admitting: Radiation Oncology

## 2020-12-13 ENCOUNTER — Encounter: Payer: Self-pay | Admitting: Radiation Oncology

## 2020-12-13 VITALS — BP 99/59 | HR 87 | Temp 97.6°F | Resp 18 | Ht 66.0 in | Wt 182.8 lb

## 2020-12-13 DIAGNOSIS — Z79899 Other long term (current) drug therapy: Secondary | ICD-10-CM | POA: Insufficient documentation

## 2020-12-13 DIAGNOSIS — Z170 Estrogen receptor positive status [ER+]: Secondary | ICD-10-CM

## 2020-12-13 DIAGNOSIS — Z510 Encounter for antineoplastic radiation therapy: Secondary | ICD-10-CM | POA: Diagnosis present

## 2020-12-13 DIAGNOSIS — C50312 Malignant neoplasm of lower-inner quadrant of left female breast: Secondary | ICD-10-CM | POA: Insufficient documentation

## 2020-12-13 DIAGNOSIS — E063 Autoimmune thyroiditis: Secondary | ICD-10-CM | POA: Diagnosis not present

## 2020-12-13 NOTE — Progress Notes (Signed)
Patient denies pain, fatigue, skin changes, swelling, tenderness. Has good range of motion w/ a tingling sensation in LT arm.   Meaningful use complete.  BP (!) 99/59 (BP Location: Right Arm, Patient Position: Sitting, Cuff Size: Large)   Pulse 87   Temp 97.6 F (36.4 C)   Resp 18   Ht '5\' 6"'$  (1.676 m)   Wt 182 lb 12.8 oz (82.9 kg)   SpO2 96%   BMI 29.50 kg/m

## 2020-12-13 NOTE — Progress Notes (Signed)
Radiation Oncology         [phone] ________________________________  Name: Valerie [name]        MRN: [id number]  Date of Service: 12/13/2020 DOB: 1970/12/12  VF:IEPPIR, Thayer Headings, NP  Nicholas Lose, MD     REFERRING PHYSICIAN: Nicholas Lose, MD   DIAGNOSIS: The encounter diagnosis was Malignant neoplasm of lower-inner quadrant of left breast in female, estrogen receptor positive ([place]).   HISTORY OF PRESENT ILLNESS: Valerie [name] is a 50 y.o. female seen for a diagnosis of left breast cancer. The patient was noted to have screening mammogram detected distortions and calcifications in the left breast she underwent diagnostic imaging which showed a 6 mm group of indeterminate left lower inner quadrant calcifications.  No abnormalities in the axilla were identified.  She underwent biopsy biopsy 10/17/2020 which showed grade 1 invasive ductal carcinoma with associated DCIS and calcifications, her tumor was ER/PR positive HER2 was negative and Ki-67 was less than 5%.  She underwent lumpectomy on 11/15/20 that showed no residual cancer and margins were negative. Her 3 sampled nodes were also negative. Dr. Lindi Adie plans to see her back following adjuvant radiotherapy which she is seen today to discuss.    PREVIOUS RADIATION THERAPY: No   PAST MEDICAL HISTORY:  Past Medical History:  Diagnosis Date   Cancer ([place])    left breast   Hashimoto's disease    Hypothyroidism    Mitral regurgitation    mild by echo 06/2020   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)    Optic neuritis        PAST SURGICAL HISTORY: Past Surgical History:  Procedure Laterality Date   BIOPSY BREAST  10/17/2020   no results yet   BREAST LUMPECTOMY WITH RADIOACTIVE SEED AND SENTINEL LYMPH NODE BIOPSY Left 11/15/2020   Procedure: LEFT BREAST LUMPECTOMY WITH RADIOACTIVE SEED AND LEFT SENTINEL LYMPH NODE BIOPSY;  Surgeon: Erroll Luna, MD;  Location: [facility];  Service:  General;  Laterality: Left;   TUBAL LIGATION  02/2012     FAMILY HISTORY:  Family History  Problem Relation Age of Onset   GER disease Mother    Other Mother        uterine fibroids, optic disc abnormality    Eczema Mother    Colon polyps Mother    Hypothyroidism Mother    Osteoarthritis Mother    Hypertension Mother    Migraines Mother    Other Father        Colon adenoma   Hyperlipidemia Father    GER disease Father    Heart murmur Father    Amblyopia Father        Right eye   Neuropathy Father    Glaucoma Maternal Grandmother    Macular degeneration Maternal Grandmother    Multiple sclerosis Paternal Grandmother    Breast cancer Neg Hx      SOCIAL HISTORY:  reports that she has never smoked. She has never used smokeless tobacco. She reports that she does not drink alcohol and does not use drugs. The patient is married and lives in [place]. She works in a shipping and receiving role. She's originally from [place].   ALLERGIES: Patient has no known allergies.   MEDICATIONS:  Current Outpatient Medications  Medication Sig Dispense Refill   Ascorbic Acid (VITA-C PO) Take 1,000 mg by mouth daily.     cholecalciferol (VITAMIN D3) 25 MCG (1000 UNIT) tablet Take 5,000 Units by mouth daily.     HYDROcodone-acetaminophen (  NORCO/VICODIN) 5-325 MG tablet Take 1 tablet by mouth every 6 (six) hours as needed for moderate pain. 15 tablet 0   ibuprofen (ADVIL) 800 MG tablet Take 1 tablet (800 mg total) by mouth every 8 (eight) hours as needed. 30 tablet 0   levothyroxine (SYNTHROID) 100 MCG tablet Take 100 mcg by mouth daily.     predniSONE (DELTASONE) 5 MG tablet Take up to 3 pills daily as directed (Patient taking differently: 10 mg. Every other day) 100 tablet 3   Probiotic Product (CULTRELLE KIDS IMMUNE DEFENSE PO) Take 1 tablet by mouth daily.     zinc gluconate 50 MG tablet Take 50 mg by mouth daily.     No current facility-administered medications for this encounter.      REVIEW OF SYSTEMS: On review of systems, the patient reports that she is doing well overall. She has been working on exercises but has some tightness in her left axilla and limited range of motion as a result. No other concerns about her incision sites are noted.      PHYSICAL EXAM:  Wt Readings from Last 3 Encounters:  11/23/20 181 lb 14.4 oz (82.5 kg)  11/15/20 179 lb 14.3 oz (81.6 kg)  10/24/20 182 lb 12.8 oz (82.9 kg)   Temp Readings from Last 3 Encounters:  11/23/20 97.8 F (36.6 C) (Temporal)  11/15/20 98 F (36.7 C)  10/24/20 98.6 F (37 C) (Tympanic)   BP Readings from Last 3 Encounters:  11/23/20 138/78  11/15/20 127/80  10/24/20 118/81   Pulse Readings from Last 3 Encounters:  11/23/20 78  11/15/20 68  10/24/20 74    In general this is a well appearing caucasian female in no acute distress. She's alert and oriented x4 and appropriate throughout the examination. Cardiopulmonary assessment is negative for acute distress and she exhibits normal effort. The left breast reveals a well healed incision site and there is fullness deep to the incision but no fluctuance. The axilla is notable for cording and limited range of motion with extending the arm past 90 degrees.    ECOG = 1  0 - Asymptomatic (Fully active, able to carry on all predisease activities without restriction)  1 - Symptomatic but completely ambulatory (Restricted in physically strenuous activity but ambulatory and able to carry out work of a light or sedentary nature. For example, light housework, office work)  2 - Symptomatic, <50% in bed during the day (Ambulatory and capable of all self care but unable to carry out any work activities. Up and about more than 50% of waking hours)  3 - Symptomatic, >50% in bed, but not bedbound (Capable of only limited self-care, confined to bed or chair 50% or more of waking hours)  4 - Bedbound (Completely disabled. Cannot carry on any self-care. Totally  confined to bed or chair)  5 - Death   Eustace Pen MM, Creech RH, Tormey DC, et al. [phone]). "Toxicity and response criteria of the [facility]". [place] Oncol. 5 (6): 649-55    LABORATORY DATA:  Lab Results  Component Value Date   WBC 6.8 10/24/2020   HGB 14.6 10/24/2020   HCT 42.6 10/24/2020   MCV 94.9 10/24/2020   PLT 256 10/24/2020   Lab Results  Component Value Date   NA 143 10/24/2020   K 4.6 10/24/2020   CL 109 10/24/2020   CO2 25 10/24/2020   Lab Results  Component Value Date   ALT 16 10/24/2020   AST 17  10/24/2020   ALKPHOS 41 10/24/2020   BILITOT 0.3 10/24/2020      RADIOGRAPHY: NM Sentinel Node Inj-No Rpt (Breast)  Result Date: 11/15/2020 Sulfur Colloid was injected by the Nuclear Medicine Technologist for sentinel lymph node localization.   MM Breast Surgical Specimen  Result Date: 11/15/2020 CLINICAL DATA:  Evaluate specimen EXAM: SPECIMEN RADIOGRAPH OF THE LEFT BREAST COMPARISON:  Previous exam(s). FINDINGS: Status post excision of the left breast. The radioactive seed is present, completely intact, and were marked for pathology. IMPRESSION: Specimen radiograph of the left breast. Electronically Signed   By: Dorise Bullion III M.D   On: 11/15/2020 08:22  MM LT RADIOACTIVE SEED LOC MAMMO GUIDE  Result Date: 11/14/2020 CLINICAL DATA:  50 year old female with newly diagnosed left breast cancer presenting for localization prior to lumpectomy. EXAM: MAMMOGRAPHIC GUIDED RADIOACTIVE SEED LOCALIZATION OF THE LEFT BREAST COMPARISON:  Previous exam(s). FINDINGS: Patient presents for radioactive seed localization prior to left breast lumpectomy. I met with the patient and we discussed the procedure of seed localization including benefits and alternatives. We discussed the high likelihood of a successful procedure. We discussed the risks of the procedure including infection, bleeding, tissue injury and further surgery. We discussed the low dose of  radioactivity involved in the procedure. Informed, written consent was given. The usual time-out protocol was performed immediately prior to the procedure. Using mammographic guidance, sterile technique, 1% lidocaine and an I-125 radioactive seed, the asymmetry/distortion in the lower inner left breast was localized using a medial approach. The follow-up mammogram images confirm the seed in the expected location and were marked for Dr. Brantley Stage. Follow-up survey of the patient confirms presence of the radioactive seed. Order number of I-125 seed:  [id number]. Total activity: 0.243mi  reference Date: 12 Nov 2020 The patient tolerated the procedure well [place] given instructions regarding seed removal. IMPRESSION: Radioactive seed localization of the left breast. No apparent complications. Electronically Signed   By: NAudie PintoM.D.   On: 11/14/2020 13:45      IMPRESSION/PLAN: 1. 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 Dr. MLisbeth Renshawdiscusses the final and very favorable pathology findings and reviews the nature of early stage breast disease. She is healing well from her surgery. No chemotherapy is recommended by Dr. GLindi Adie She is counseled on the rationale for external radiotherapy to the breast  to reduce risks of local recurrence followed by antiestrogen therapy. We discussed the risks, benefits, short, and long term effects of radiotherapy, as well as the curative intent, and the patient is interested in proceeding. Dr. MLisbeth Renshawdiscusses the delivery and logistics of radiotherapy and anticipates a course of 4 weeks of radiotherapy to the left breast with deep inspiration breath hold technique. Written consent is obtained and placed in the chart, a copy was provided to the patient. She will simulate today. 2. Contraceptive Counseling. The patient has had tubal ligation and does not need pregnancy testing prior to  proceeding with radiotherapy. 3. Axillary Cording. I will reach out to PT to see if they can see her in the next week.  In a visit lasting 45 minutes, greater than 50% of the time was spent face to face reviewing her case, as well as in preparation of, discussing, and coordinating the patient's care.  The above documentation reflects my direct findings during this shared patient visit. Please see the separate note by Dr. MLisbeth Renshawon this date for the remainder of  the patient's plan of care.    Carola Rhine, [facility]    **Disclaimer: This note was dictated with voice recognition software. Similar sounding words can inadvertently be transcribed and this note may contain transcription errors which may not have been corrected upon publication of note.**

## 2020-12-18 ENCOUNTER — Ambulatory Visit: Payer: Commercial Managed Care - PPO | Attending: Surgery

## 2020-12-18 ENCOUNTER — Other Ambulatory Visit: Payer: Self-pay

## 2020-12-18 ENCOUNTER — Encounter: Payer: Self-pay | Admitting: *Deleted

## 2020-12-18 DIAGNOSIS — R293 Abnormal posture: Secondary | ICD-10-CM | POA: Insufficient documentation

## 2020-12-18 DIAGNOSIS — Z170 Estrogen receptor positive status [ER+]: Secondary | ICD-10-CM | POA: Diagnosis present

## 2020-12-18 DIAGNOSIS — C50312 Malignant neoplasm of lower-inner quadrant of left female breast: Secondary | ICD-10-CM | POA: Diagnosis present

## 2020-12-18 DIAGNOSIS — Z483 Aftercare following surgery for neoplasm: Secondary | ICD-10-CM | POA: Diagnosis present

## 2020-12-18 NOTE — Therapy (Signed)
[place], [place], [address] Phone: [phone]   Fax:  [phone]  Physical Therapy Treatment  Patient Details  Name: Valerie [name] MRN: [id number] Date of Birth: January 03, 1971 Referring Provider (PT): Dr. Erroll Luna   Encounter Date: 12/18/2020   PT End of Session - 12/18/20 1736     Visit Number 3    Number of Visits 10    Date for PT Re-Evaluation 01/04/21    PT Start Time 2202    PT Stop Time 5427    PT Time Calculation (min) 56 min    Activity Tolerance Patient tolerated treatment well    Behavior During Therapy [facility] for tasks assessed/performed             Past Medical History:  Diagnosis Date   Cancer ([place])    left breast   Hashimoto's disease    Hypothyroidism    Mitral regurgitation    mild by echo 06/2020   Myelin oligodendrocyte glycoprotein antibody disorder (MOGAD) ([place])    Optic neuritis     Past Surgical History:  Procedure Laterality Date   BIOPSY BREAST  10/17/2020   no results yet   BREAST LUMPECTOMY WITH RADIOACTIVE SEED AND SENTINEL LYMPH NODE BIOPSY Left 11/15/2020   Procedure: LEFT BREAST LUMPECTOMY WITH RADIOACTIVE SEED AND LEFT SENTINEL LYMPH NODE BIOPSY;  Surgeon: Erroll Luna, MD;  Location: [place];  Service: General;  Laterality: Left;   TUBAL LIGATION  02/2012    There were no vitals filed for this visit.   Subjective Assessment - 12/18/20 1303     Subjective I tried the TG soft but it was too tight so I didn't wear it long. I got the [place] bra last week. I had trouble getting the silicone insert to stay in it. I am going to [place] today to exchange things.  The breast swelling feels better with the compression bra, but I am not sure I have the right size.  I was hoping we could look at that today.  The burning pain in her arm/sensitivity is much better overall and she is compliant with HEP. Pt. starts radiation next week.     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 It measures 6 mm in the lower inner quadrant. it is ER/PR positive and HER2 negaitve with a Ki67 of < 5%. 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 She is treated with prednisone which impacts fluid retention.  She had left lumpectomy on 11/15/2020 with 0/3 lymph nodes removed. She says that she has a problem with passing out with medical things but can let us know if she feels this might happen.    Patient Stated Goals get rid of the arm discomfort    Currently in Pain? No/denies    Pain Score 0-No pain                               [facility] Adult PT Treatment/Exercise - 12/18/20 0001       Shoulder Exercises: Supine   Flexion --   with dowel, and scaption   Other Supine Exercises AA flex, scaption with wand, ER x 3 ea      Manual Therapy   Manual therapy comments --    Edema Management Pt tried  on several different sizes of [place] bra with large seeming to be the best fit. Bra was rolling at the bottom and several pieces of 1/2 foam were added under band to see if that would keep it in place.    Soft tissue mobilization Soft tissue mobilization with coco butter to left pecs, lats and scapular region in SL    Manual Lymphatic Drainage (MLD) MLD to supraclavicular, bilateral axillary LN's and Left inguinal LN's, anterior interaxillary pathway, and left axillo-inguinal pathway repeated several times with pt also practicing LN activation and pathways reclined on table    Passive ROM Left shoulder flexion, abd, ER, diagonals to restore functional ROM                  Upper Extremity Functional Index Score :   /80        PT Long Term Goals - 12/06/20 1730       PT LONG TERM GOAL #1   Title Patient will demonstrate she has regained full shoulder ROM and  function post operatively compared to baselines.    Time 8    Period Weeks    Status On-going      PT LONG TERM GOAL #2   Title Pt will report the discomfort in her arm and axilla will have decreased by 75% so that she is able to sleep through the night    Time 4    Period Weeks    Status New      PT LONG TERM GOAL #3   Title Pt will be independent in a home exercise for shoulder ROM    Time 4    Period Weeks    Status New                   Plan - 12/18/20 1737     Clinical Impression Statement Pt could not tolerate wearing the TG soft because it was too constricting.  She did buy a [place] bra which is comfortable for her except for the rolling at the bottom.  I made( 2) 1/2 in foam pads in stockinette to place under the bottom of her bra and that seemed to prevent rolling quite well.  She tried on the large and extra large and we decided the large was a better fit for her.  She performed AAROM exs and then PT performed PROM. Her shoulder ROM is progressing nicely and I noted 1 very tiny axillary cord not limiting her ROM.  We started some instruction in MLD but time was short.  She did practice LN activation and pathways and did quite well with the stretch and technique. She does have some fibrosis under the breast incision, and the bra seems to have helped the posterior axilllary region    Stability/Clinical Decision Making Stable/Uncomplicated    Rehab Potential Excellent    PT Frequency 2x / week    PT Duration 4 weeks    PT Treatment/Interventions ADLs/Self Care Home Management;Therapeutic exercise;Patient/family education;Manual lymph drainage;Compression bandaging;Manual techniques;Passive range of motion    PT Next Visit Plan See how foam did to keep bra from rolling at the bottom,MLD especially to posterior axilla . ( consider teaching husband or videoing on her phone to show husband how to do it)PROM, stretched, desensitizaion and nerve stretches as needed.    PT Home  Exercise Plan Post op shoulder ROM HEP, dowel exercises    Recommended Other Services pt purchased compression bra    Consulted and Agree with  Plan of Care Patient             Patient will benefit from skilled therapeutic intervention in order to improve the following deficits and impairments:  Postural dysfunction, Decreased range of motion, Decreased knowledge of precautions, Impaired UE functional use, Pain, Increased edema  Visit Diagnosis: Aftercare following surgery for neoplasm  Malignant neoplasm of lower-inner quadrant of left breast in female, estrogen receptor positive ([place])  Abnormal posture     Problem List Patient Active Problem List   Diagnosis Date Noted   Malignant neoplasm of lower-inner quadrant of left breast in female, estrogen receptor positive ([place]) 10/23/2020   Optic neuritis, left 10/18/2020   Myelin oligodendrocyte glycoprotein antibody disorder (MOGAD) ([place]) 07/10/2020   Mitral regurgitation    High risk medication use 05/07/2020   Neuromyelitis optica (devic) ([place]) 05/07/2020   Hypothyroid 04/25/2020   Optic neuritis, right 04/24/2020    Claris Pong, PT 12/18/2020, 5:45 PM  [place], [place], [address] Phone: [phone]   Fax:  [phone]  Name: JADELIN ENG MRN: [id number] Date of Birth: 11-21-1970  Cheral Almas, PT 12/18/20 5:46 PM

## 2020-12-20 ENCOUNTER — Other Ambulatory Visit: Payer: Self-pay | Admitting: *Deleted

## 2020-12-20 ENCOUNTER — Encounter: Payer: Self-pay | Admitting: Hematology and Oncology

## 2020-12-20 ENCOUNTER — Ambulatory Visit: Payer: Commercial Managed Care - PPO | Admitting: Physical Therapy

## 2020-12-20 DIAGNOSIS — Z170 Estrogen receptor positive status [ER+]: Secondary | ICD-10-CM

## 2020-12-20 DIAGNOSIS — C50312 Malignant neoplasm of lower-inner quadrant of left female breast: Secondary | ICD-10-CM

## 2020-12-21 DIAGNOSIS — C50312 Malignant neoplasm of lower-inner quadrant of left female breast: Secondary | ICD-10-CM | POA: Diagnosis not present

## 2020-12-24 ENCOUNTER — Ambulatory Visit
Admission: RE | Admit: 2020-12-24 | Discharge: 2020-12-24 | Disposition: A | Discharge status: Home / Self Care | Payer: Commercial Managed Care - PPO | Source: Ambulatory Visit | Attending: Radiation Oncology | Admitting: Radiation Oncology

## 2020-12-24 ENCOUNTER — Other Ambulatory Visit: Payer: Self-pay

## 2020-12-24 DIAGNOSIS — C50312 Malignant neoplasm of lower-inner quadrant of left female breast: Secondary | ICD-10-CM | POA: Diagnosis not present

## 2020-12-25 ENCOUNTER — Ambulatory Visit: Payer: Commercial Managed Care - PPO | Admitting: Rehabilitation

## 2020-12-25 ENCOUNTER — Encounter: Payer: Self-pay | Admitting: Rehabilitation

## 2020-12-25 ENCOUNTER — Ambulatory Visit
Admission: RE | Admit: 2020-12-25 | Discharge: 2020-12-25 | Disposition: A | Discharge status: Home / Self Care | Payer: Commercial Managed Care - PPO | Source: Ambulatory Visit | Attending: Radiation Oncology | Admitting: Radiation Oncology

## 2020-12-25 DIAGNOSIS — C50312 Malignant neoplasm of lower-inner quadrant of left female breast: Secondary | ICD-10-CM

## 2020-12-25 DIAGNOSIS — R293 Abnormal posture: Secondary | ICD-10-CM

## 2020-12-25 DIAGNOSIS — Z483 Aftercare following surgery for neoplasm: Secondary | ICD-10-CM | POA: Diagnosis not present

## 2020-12-25 NOTE — Patient Instructions (Signed)

## 2020-12-25 NOTE — Therapy (Signed)
[place], [place], [address] Phone: [phone]   Fax:  [phone]  Physical Therapy Treatment  Patient Details  Name: Valerie [name] MRN: [id number] Date of Birth: 08-21-1970 Referring Provider (PT): Dr. Erroll Luna   Encounter Date: 12/25/2020   PT End of Session - 12/25/20 1701     Visit Number 4    Number of Visits 10    Date for PT Re-Evaluation 01/04/21    PT Start Time 1600    PT Stop Time 1657    PT Time Calculation (min) 57 min    Activity Tolerance Patient tolerated treatment well    Behavior During Therapy [facility] for tasks assessed/performed             Past Medical History:  Diagnosis Date   Cancer ([place])    left breast   Hashimoto's disease    Hypothyroidism    Mitral regurgitation    mild by echo 06/2020   Myelin oligodendrocyte glycoprotein antibody disorder (MOGAD) ([place])    Optic neuritis     Past Surgical History:  Procedure Laterality Date   BIOPSY BREAST  10/17/2020   no results yet   BREAST LUMPECTOMY WITH RADIOACTIVE SEED AND SENTINEL LYMPH NODE BIOPSY Left 11/15/2020   Procedure: LEFT BREAST LUMPECTOMY WITH RADIOACTIVE SEED AND LEFT SENTINEL LYMPH NODE BIOPSY;  Surgeon: Erroll Luna, MD;  Location: [place];  Service: General;  Laterality: Left;   TUBAL LIGATION  02/2012    There were no vitals filed for this visit.   Subjective Assessment - 12/25/20 1602     Subjective I was tight in the left pectoralis last night but better now.  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 It measures 6 mm in the lower inner quadrant. it is ER/PR positive and HER2 negaitve with a Ki67 of < 5%. 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 She  is treated with prednisone which impacts fluid retention.  She had left lumpectomy on 11/15/2020 with 0/3 lymph nodes removed. She says that she has a problem with passing out with medical things but can let us know if she feels this might happen.    Currently in Pain? No/denies                               [facility] Adult PT Treatment/Exercise - 12/25/20 0001       Manual Therapy   Soft tissue mobilization Soft tissue mobilization with coco butter to left pecs, lats and scapular region in SL    Manual Lymphatic Drainage (MLD) husband present to learn MLD: gave them handout with vcs and tcs for each step: MLD to supraclavicular, bilateral axillary LN's and Left inguinal LN's, anterior interaxillary pathway, and left axillo-inguinal pathway, then left anterior chest and lateral chest - no breast involvement - and then repeating all steps    Passive ROM Left shoulder flexion, abd, ER, diagonals to restore functional ROM                          PT Long Term Goals - 12/06/20 1730       PT LONG TERM GOAL #1   Title Patient  will demonstrate she has regained full shoulder ROM and function post operatively compared to baselines.    Time 8    Period Weeks    Status On-going      PT LONG TERM GOAL #2   Title Pt will report the discomfort in her arm and axilla will have decreased by 75% so that she is able to sleep through the night    Time 4    Period Weeks    Status New      PT LONG TERM GOAL #3   Title Pt will be independent in a home exercise for shoulder ROM    Time 4    Period Weeks    Status New                   Plan - 12/25/20 1701     Clinical Impression Statement Pt has improved arm sensitivity now able to wear long sleeves and reports the compression bra overall has helped the edema.  First visit with this PT today noting only mild edema lateral trunk and anterior chest near pectoralis.  Tightness lateral pectoralis border very evident  in sidelying with abduction improved with STM.  Pts husband performed MLD appropriately and correctly with education.  Pt doing very well today.    Stability/Clinical Decision Making Stable/Uncomplicated    Rehab Potential Excellent    PT Frequency 2x / week    PT Duration 4 weeks    PT Treatment/Interventions ADLs/Self Care Home Management;Therapeutic exercise;Patient/family education;Manual lymph drainage;Compression bandaging;Manual techniques;Passive range of motion    PT Next Visit Plan AAROM activities, PROM, STM to Lt upper quadrant and MLD as needed    Consulted and Agree with Plan of Care Patient    Family Member Consulted Husband             Patient will benefit from skilled therapeutic intervention in order to improve the following deficits and impairments:  Postural dysfunction, Decreased range of motion, Decreased knowledge of precautions, Impaired UE functional use, Pain, Increased edema  Visit Diagnosis: Aftercare following surgery for neoplasm  Malignant neoplasm of lower-inner quadrant of left breast in female, estrogen receptor positive ([place])  Abnormal posture     Problem List Patient Active Problem List   Diagnosis Date Noted   Malignant neoplasm of lower-inner quadrant of left breast in female, estrogen receptor positive ([place]) 10/23/2020   Optic neuritis, left 10/18/2020   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

## 2020-12-26 ENCOUNTER — Ambulatory Visit
Admission: RE | Admit: 2020-12-26 | Discharge: 2020-12-26 | Disposition: A | Discharge status: Home / Self Care | Payer: Commercial Managed Care - PPO | Source: Ambulatory Visit | Attending: Radiation Oncology | Admitting: Radiation Oncology

## 2020-12-26 ENCOUNTER — Other Ambulatory Visit: Payer: Self-pay

## 2020-12-26 DIAGNOSIS — C50312 Malignant neoplasm of lower-inner quadrant of left female breast: Secondary | ICD-10-CM | POA: Diagnosis not present

## 2020-12-27 ENCOUNTER — Ambulatory Visit: Payer: Commercial Managed Care - PPO | Admitting: Physical Therapy

## 2020-12-27 ENCOUNTER — Ambulatory Visit
Admission: RE | Admit: 2020-12-27 | Discharge: 2020-12-27 | Disposition: A | Discharge status: Home / Self Care | Payer: Commercial Managed Care - PPO | Source: Ambulatory Visit | Attending: Radiation Oncology | Admitting: Radiation Oncology

## 2020-12-27 ENCOUNTER — Encounter: Payer: Self-pay | Admitting: Physical Therapy

## 2020-12-27 DIAGNOSIS — R293 Abnormal posture: Secondary | ICD-10-CM

## 2020-12-27 DIAGNOSIS — C50312 Malignant neoplasm of lower-inner quadrant of left female breast: Secondary | ICD-10-CM

## 2020-12-27 DIAGNOSIS — Z483 Aftercare following surgery for neoplasm: Secondary | ICD-10-CM | POA: Diagnosis not present

## 2020-12-27 DIAGNOSIS — Z170 Estrogen receptor positive status [ER+]: Secondary | ICD-10-CM

## 2020-12-27 NOTE — Therapy (Signed)
[place], [place], [address] Phone: [phone]   Fax:  [phone]  Physical Therapy Treatment  Patient Details  Name: Valerie [name] MRN: [id number] Date of Birth: 06/27/1970 Referring Provider (PT): Dr. Erroll Luna   Encounter Date: 12/27/2020   PT End of Session - 12/27/20 1401     Visit Number 5    Number of Visits 10    Date for PT Re-Evaluation 01/04/21    PT Start Time 1301    PT Stop Time 0630    PT Time Calculation (min) 57 min    Activity Tolerance Patient tolerated treatment well    Behavior During Therapy [facility] for tasks assessed/performed             Past Medical History:  Diagnosis Date   Cancer ([place])    left breast   Hashimoto's disease    Hypothyroidism    Mitral regurgitation    mild by echo 06/2020   Myelin oligodendrocyte glycoprotein antibody disorder (MOGAD) ([place])    Optic neuritis     Past Surgical History:  Procedure Laterality Date   BIOPSY BREAST  10/17/2020   no results yet   BREAST LUMPECTOMY WITH RADIOACTIVE SEED AND SENTINEL LYMPH NODE BIOPSY Left 11/15/2020   Procedure: LEFT BREAST LUMPECTOMY WITH RADIOACTIVE SEED AND LEFT SENTINEL LYMPH NODE BIOPSY;  Surgeon: Erroll Luna, MD;  Location: [facility];  Service: General;  Laterality: Left;   TUBAL LIGATION  02/2012    There were no vitals filed for this visit.   Subjective Assessment - 12/27/20 1301     Subjective It feels pretty tight still. When I did my treatment this morning I could feel it.    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 It measures 6 mm in the lower inner quadrant. it is ER/PR positive and HER2 negaitve with a Ki67 of < 5%. 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 She is treated with prednisone which  impacts fluid retention.  She had left lumpectomy on 11/15/2020 with 0/3 lymph nodes removed. She says that she has a problem with passing out with medical things but can let us know if she feels this might happen.    Patient Stated Goals get rid of the arm discomfort    Currently in Pain? No/denies    Pain Score 0-No pain                               [facility] Adult PT Treatment/Exercise - 12/27/20 0001       Shoulder Exercises: Pulleys   Flexion 2 minutes    ABduction 2 minutes      Shoulder Exercises: Therapy Ball   Flexion Both;10 reps   with stretch at end range   ABduction Left;10 reps   with stretch at end range     Manual Therapy   Manual Therapy Edema management;Passive ROM;Myofascial release    Edema Management cut 1/2 grey foam and placed in thick stockinette for pt to wear in her bra against her lumpectomy scar to help decrease fibrosis, also issued small dot peach foam to pt to wear on top of scar to further decrease fibrosis    Soft tissue mobilization soft tissue mobilization to L pec using cocoa butter    Myofascial Release  gently to L lumpectomy scar to decrease fibrosis    Passive ROM Left shoulder flexion, abd, ER, diagonals to help improve ROM and decrease tightness                          PT Long Term Goals - 12/06/20 1730       PT LONG TERM GOAL #1   Title Patient will demonstrate she has regained full shoulder ROM and function post operatively compared to baselines.    Time 8    Period Weeks    Status On-going      PT LONG TERM GOAL #2   Title Pt will report the discomfort in her arm and axilla will have decreased by 75% so that she is able to sleep through the night    Time 4    Period Weeks    Status New      PT LONG TERM GOAL #3   Title Pt will be independent in a home exercise for shoulder ROM    Time 4    Period Weeks    Status New                   Plan - 12/27/20 1402     Clinical Impression  Statement Began instructing pt in AAROM exercises today with pt feeling a good stretch with this. Continued with PROM to L shoulder and soft tissue mobilization to L pec. Also began scar mobilization to L lumpectomy scar in area of fibrosis. Cut foam for pt to wear in her bra to help decrease fibrosis in area of scar. Will instruct pt in supine scapular series at next session for continued strengthening and stretching.    PT Frequency 2x / week    PT Duration 4 weeks    PT Treatment/Interventions ADLs/Self Care Home Management;Therapeutic exercise;Patient/family education;Manual lymph drainage;Compression bandaging;Manual techniques;Passive range of motion    PT Next Visit Plan pulleys, ball, instruct in supine scap, , PROM, STM to Lt upper quadrant and MLD as needed    PT Home Exercise Plan Post op shoulder ROM HEP, dowel exercises    Consulted and Agree with Plan of Care Patient             Patient will benefit from skilled therapeutic intervention in order to improve the following deficits and impairments:  Postural dysfunction, Decreased range of motion, Decreased knowledge of precautions, Impaired UE functional use, Pain, Increased edema  Visit Diagnosis: Aftercare following surgery for neoplasm  Abnormal posture  Malignant neoplasm of lower-inner quadrant of left breast in female, estrogen receptor positive [facility])     Problem List Patient Active Problem List   Diagnosis Date Noted   Malignant neoplasm of lower-inner quadrant of left breast in female, estrogen receptor positive ([place]) 10/23/2020   Optic neuritis, left 10/18/2020   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

## 2020-12-27 NOTE — Progress Notes (Signed)

## 2020-12-28 ENCOUNTER — Other Ambulatory Visit: Payer: Self-pay

## 2020-12-28 ENCOUNTER — Ambulatory Visit
Admission: RE | Admit: 2020-12-28 | Discharge: 2020-12-28 | Disposition: A | Discharge status: Home / Self Care | Payer: Commercial Managed Care - PPO | Source: Ambulatory Visit | Attending: Radiation Oncology | Admitting: Radiation Oncology

## 2020-12-28 DIAGNOSIS — C50312 Malignant neoplasm of lower-inner quadrant of left female breast: Secondary | ICD-10-CM

## 2020-12-28 MED ORDER — ALRA NON-METALLIC DEODORANT (RAD-ONC)
1.0000 "application " | Freq: Once | TOPICAL | Status: AC
  Administered 2020-12-28: 1 via TOPICAL

## 2020-12-28 MED ORDER — RADIAPLEXRX EX GEL: Freq: Once | CUTANEOUS | Status: AC

## 2020-12-31 ENCOUNTER — Encounter: Payer: Self-pay | Admitting: Rehabilitation

## 2020-12-31 ENCOUNTER — Ambulatory Visit: Payer: Commercial Managed Care - PPO | Admitting: Rehabilitation

## 2020-12-31 ENCOUNTER — Ambulatory Visit
Admission: RE | Admit: 2020-12-31 | Discharge: 2020-12-31 | Disposition: A | Discharge status: Home / Self Care | Payer: Commercial Managed Care - PPO | Source: Ambulatory Visit | Attending: Radiation Oncology | Admitting: Radiation Oncology

## 2020-12-31 ENCOUNTER — Other Ambulatory Visit: Payer: Self-pay

## 2020-12-31 DIAGNOSIS — Z483 Aftercare following surgery for neoplasm: Secondary | ICD-10-CM | POA: Diagnosis not present

## 2020-12-31 DIAGNOSIS — C50312 Malignant neoplasm of lower-inner quadrant of left female breast: Secondary | ICD-10-CM | POA: Diagnosis not present

## 2020-12-31 DIAGNOSIS — R293 Abnormal posture: Secondary | ICD-10-CM

## 2020-12-31 NOTE — Therapy (Signed)
[place], [place], [address] Phone: [phone]   Fax:  [phone]  Physical Therapy Treatment  Patient Details  Name: Valerie [name] MRN: [id number] Date of Birth: 1971/02/14 Referring Provider (PT): Dr. Erroll Luna   Encounter Date: 12/31/2020   PT End of Session - 12/31/20 0945     Visit Number 6    Number of Visits 10    Date for PT Re-Evaluation 01/04/21    PT Start Time 0900    PT Stop Time 0946    PT Time Calculation (min) 46 min    Activity Tolerance Patient tolerated treatment well    Behavior During Therapy [facility] for tasks assessed/performed             Past Medical History:  Diagnosis Date   Cancer ([place])    left breast   Hashimoto's disease    Hypothyroidism    Mitral regurgitation    mild by echo 06/2020   Myelin oligodendrocyte glycoprotein antibody disorder (MOGAD) ([place])    Optic neuritis     Past Surgical History:  Procedure Laterality Date   BIOPSY BREAST  10/17/2020   no results yet   BREAST LUMPECTOMY WITH RADIOACTIVE SEED AND SENTINEL LYMPH NODE BIOPSY Left 11/15/2020   Procedure: LEFT BREAST LUMPECTOMY WITH RADIOACTIVE SEED AND LEFT SENTINEL LYMPH NODE BIOPSY;  Surgeon: Erroll Luna, MD;  Location: [facility];  Service: General;  Laterality: Left;   TUBAL LIGATION  02/2012    There were no vitals filed for this visit.   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 It measures 6 mm in the lower inner quadrant. it is ER/PR positive and HER2 negaitve with a Ki67 of < 5%. 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 She is treated with prednisone which impacts fluid  retention.  She had left lumpectomy on 11/15/2020 with 0/3 lymph nodes removed. She says that she has a problem with passing out with medical things but can let us know if she feels this might happen.    Currently in Pain? No/denies                               [facility] Adult PT Treatment/Exercise - 12/31/20 0001       Exercises   Exercises Shoulder      Shoulder Exercises: Supine   Horizontal ABduction Both;10 reps    Theraband Level (Shoulder Horizontal ABduction) Level 1 ([place]ellow)    Horizontal ABduction Limitations with initial instruction    External Rotation 10 reps    Theraband Level (Shoulder External Rotation) Level 1 (Yellow)    External Rotation Limitations with initial instruction    Diagonals Both;5 reps    Theraband Level (Shoulder Diagonals) Level 1 (Yellow)    Diagonals Limitations with initial instruction - only did 5 due to some increased back pain      Shoulder Exercises: Pulleys   Flexion 2 minutes    ABduction 2 minutes      Shoulder Exercises: Therapy Ball   Flexion Both;10 reps    ABduction Left;10 reps  PT Long Term Goals - 12/06/20 1730       PT LONG TERM GOAL #1   Title Patient will demonstrate she has regained full shoulder ROM and function post operatively compared to baselines.    Time 8    Period Weeks    Status On-going      PT LONG TERM GOAL #2   Title Pt will report the discomfort in her arm and axilla will have decreased by 75% so that she is able to sleep through the night    Time 4    Period Weeks    Status New      PT LONG TERM GOAL #3   Title Pt will be independent in a home exercise for shoulder ROM    Time 4    Period Weeks    Status New                   Plan - 12/31/20 0946     Clinical Impression Statement Added supine scap which was tolerated well except for some back tightness.  Advised pt to include some more rotation movements during the day as this  can get stiff.  Has some signs of cording still in the left axilla and antecubital fossa with tightness into abduction and horizontal abduction.  Avoided much breast work as this is starting to get red and the rincisions are already starting to get red.    PT Frequency 2x / week    PT Duration 4 weeks    PT Treatment/Interventions ADLs/Self Care Home Management;Therapeutic exercise;Patient/family education;Manual lymph drainage;Compression bandaging;Manual techniques;Passive range of motion    PT Next Visit Plan pulleys, ball, supine scap instruction if tolerated well last time , PROM, STM to Lt upper quadrant and MLD as needed    Consulted and Agree with Plan of Care Patient             Patient will benefit from skilled therapeutic intervention in order to improve the following deficits and impairments:  Postural dysfunction, Decreased range of motion, Decreased knowledge of precautions, Impaired UE functional use, Pain, Increased edema  Visit Diagnosis: Aftercare following surgery for neoplasm  Abnormal posture  Malignant neoplasm of lower-inner quadrant of left breast in female, estrogen receptor positive [facility])     Problem List Patient Active Problem List   Diagnosis Date Noted   Malignant neoplasm of lower-inner quadrant of left breast in female, estrogen receptor positive ([place]) 10/23/2020   Optic neuritis, left 10/18/2020   Myelin oligodendrocyte glycoprotein antibody disorder (MOGAD) ([place]) 07/10/2020   Mitral regurgitation    High risk medication use 05/07/2020   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

## 2021-01-01 ENCOUNTER — Ambulatory Visit
Admission: RE | Admit: 2021-01-01 | Discharge: 2021-01-01 | Disposition: A | Discharge status: Home / Self Care | Payer: Commercial Managed Care - PPO | Source: Ambulatory Visit | Attending: Radiation Oncology | Admitting: Radiation Oncology

## 2021-01-01 DIAGNOSIS — C50312 Malignant neoplasm of lower-inner quadrant of left female breast: Secondary | ICD-10-CM | POA: Diagnosis not present

## 2021-01-02 ENCOUNTER — Ambulatory Visit: Payer: Commercial Managed Care - PPO | Admitting: Rehabilitation

## 2021-01-02 ENCOUNTER — Other Ambulatory Visit: Payer: Self-pay

## 2021-01-02 ENCOUNTER — Ambulatory Visit
Admission: RE | Admit: 2021-01-02 | Discharge: 2021-01-02 | Disposition: A | Discharge status: Home / Self Care | Payer: Commercial Managed Care - PPO | Source: Ambulatory Visit | Attending: Radiation Oncology | Admitting: Radiation Oncology

## 2021-01-02 ENCOUNTER — Encounter: Payer: Self-pay | Admitting: Rehabilitation

## 2021-01-02 DIAGNOSIS — Z170 Estrogen receptor positive status [ER+]: Secondary | ICD-10-CM

## 2021-01-02 DIAGNOSIS — C50312 Malignant neoplasm of lower-inner quadrant of left female breast: Secondary | ICD-10-CM

## 2021-01-02 DIAGNOSIS — Z483 Aftercare following surgery for neoplasm: Secondary | ICD-10-CM

## 2021-01-02 DIAGNOSIS — R293 Abnormal posture: Secondary | ICD-10-CM

## 2021-01-02 NOTE — Therapy (Signed)
[place], [place], [address] Phone: [phone]   Fax:  [phone]  Physical Therapy Treatment  Patient Details  Name: Valerie [name] MRN: [id number] Date of Birth: 03-13-1971 Referring Provider (PT): Dr. Erroll Luna   Encounter Date: 01/02/2021   PT End of Session - 01/02/21 0950     Visit Number 7    Number of Visits 10    Date for PT Re-Evaluation 01/04/21    PT Start Time 0900    PT Stop Time 0953    PT Time Calculation (min) 53 min             Past Medical History:  Diagnosis Date   Cancer ([place])    left breast   Hashimoto's disease    Hypothyroidism    Mitral regurgitation    mild by echo 06/2020   Myelin oligodendrocyte glycoprotein antibody disorder (MOGAD) ([place])    Optic neuritis     Past Surgical History:  Procedure Laterality Date   BIOPSY BREAST  10/17/2020   no results yet   BREAST LUMPECTOMY WITH RADIOACTIVE SEED AND SENTINEL LYMPH NODE BIOPSY Left 11/15/2020   Procedure: LEFT BREAST LUMPECTOMY WITH RADIOACTIVE SEED AND LEFT SENTINEL LYMPH NODE BIOPSY;  Surgeon: Erroll Luna, MD;  Location: [facility];  Service: General;  Laterality: Left;   TUBAL LIGATION  02/2012    There were no vitals filed for this visit.   Subjective Assessment - 01/02/21 0856     Subjective nothing new.  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 It measures 6 mm in the lower inner quadrant. it is ER/PR positive and HER2 negaitve with a Ki67 of < 5%. 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 She is treated with prednisone which impacts fluid retention.  She had left lumpectomy on 11/15/2020 with 0/3 lymph nodes removed. She says that she has a problem with  passing out with medical things but can let us know if she feels this might happen.    Currently in Pain? No/denies                               [facility] Adult PT Treatment/Exercise - 01/02/21 0001       Exercises   Exercises Other Exercises    Other Exercises  educated pt on strength ABC program with performance of all x 15 seconds stretches or x 10 each with 2# weights and educated on walking and strengthe ABC, then gave pt hirsch information for the month      Shoulder Exercises: Pulleys   Flexion 2 minutes    ABduction 2 minutes      Shoulder Exercises: Therapy Ball   Flexion Both;10 reps    ABduction Left;10 reps      Manual Therapy   Soft tissue mobilization soft tissue mobilization to L pec using cocoa butter    Passive ROM Left shoulder flexion, abd, ER, diagonals to help improve ROM and decrease tightness                          PT Long Term Goals - 12/06/20 1730       PT LONG TERM GOAL #  1   Title Patient will demonstrate she has regained full shoulder ROM and function post operatively compared to baselines.    Time 8    Period Weeks    Status On-going      PT LONG TERM GOAL #2   Title Pt will report the discomfort in her arm and axilla will have decreased by 75% so that she is able to sleep through the night    Time 4    Period Weeks    Status New      PT LONG TERM GOAL #3   Title Pt will be independent in a home exercise for shoulder ROM    Time 4    Period Weeks    Status New                   Plan - 01/02/21 0950     Clinical Impression Statement Discussion about exercise with instruction in strength ABC program, hirsch activities, with tolerance of all without pain.  Continued PROM and STM to the pectoralis area with some redness starting in the axilla and breast but still not at pectoralis region.  Discussed how 2 more visits should be ok for now and then a recheck a few weeks post radiation    PT Frequency  2x / week    PT Duration 4 weeks    PT Treatment/Interventions ADLs/Self Care Home Management;Therapeutic exercise;Patient/family education;Manual lymph drainage;Compression bandaging;Manual techniques;Passive range of motion    PT Next Visit Plan pulleys, ball, PROM, STM to Lt upper quadrant and MLD as needed    Consulted and Agree with Plan of Care Patient             Patient will benefit from skilled therapeutic intervention in order to improve the following deficits and impairments:  Postural dysfunction, Decreased range of motion, Decreased knowledge of precautions, Impaired UE functional use, Pain, Increased edema  Visit Diagnosis: Aftercare following surgery for neoplasm  Abnormal posture  Malignant neoplasm of lower-inner quadrant of left breast in female, estrogen receptor positive [facility])     Problem List Patient Active Problem List   Diagnosis Date Noted   Malignant neoplasm of lower-inner quadrant of left breast in female, estrogen receptor positive ([place]) 10/23/2020   Optic neuritis, left 10/18/2020   Myelin oligodendrocyte glycoprotein antibody disorder (MOGAD) ([place]) 07/10/2020   Mitral regurgitation    High risk medication use 05/07/2020   Neuromyelitis optica (devic) ([place]) 05/07/2020   Hypothyroid 04/25/2020   Optic neuritis, right 04/24/2020    Stark Bray, PT 01/02/2021, 9:55 AM  [place], [place], [address] Phone: [phone]   Fax:  [phone]  Name: DANISSA RUNDLE MRN: [id number] Date of Birth: 1970/05/24

## 2021-01-03 ENCOUNTER — Ambulatory Visit
Admission: RE | Admit: 2021-01-03 | Discharge: 2021-01-03 | Disposition: A | Discharge status: Home / Self Care | Payer: Commercial Managed Care - PPO | Source: Ambulatory Visit | Attending: Radiation Oncology | Admitting: Radiation Oncology

## 2021-01-03 DIAGNOSIS — C50312 Malignant neoplasm of lower-inner quadrant of left female breast: Secondary | ICD-10-CM | POA: Diagnosis not present

## 2021-01-04 ENCOUNTER — Ambulatory Visit
Admission: RE | Admit: 2021-01-04 | Discharge: 2021-01-04 | Disposition: A | Discharge status: Home / Self Care | Payer: Commercial Managed Care - PPO | Source: Ambulatory Visit | Attending: Radiation Oncology | Admitting: Radiation Oncology

## 2021-01-04 ENCOUNTER — Other Ambulatory Visit: Payer: Self-pay

## 2021-01-04 DIAGNOSIS — Z170 Estrogen receptor positive status [ER+]: Secondary | ICD-10-CM

## 2021-01-04 DIAGNOSIS — C50312 Malignant neoplasm of lower-inner quadrant of left female breast: Secondary | ICD-10-CM

## 2021-01-04 MED ORDER — RADIAPLEXRX EX GEL
1.0000 "application " | Freq: Once | CUTANEOUS | Status: AC
  Administered 2021-01-04: 1 via TOPICAL

## 2021-01-07 ENCOUNTER — Encounter: Payer: Self-pay | Admitting: Rehabilitation

## 2021-01-07 ENCOUNTER — Ambulatory Visit: Payer: Commercial Managed Care - PPO | Admitting: Rehabilitation

## 2021-01-07 ENCOUNTER — Ambulatory Visit
Admission: RE | Admit: 2021-01-07 | Discharge: 2021-01-07 | Disposition: A | Discharge status: Home / Self Care | Payer: Commercial Managed Care - PPO | Source: Ambulatory Visit | Attending: Radiation Oncology | Admitting: Radiation Oncology

## 2021-01-07 ENCOUNTER — Other Ambulatory Visit: Payer: Self-pay

## 2021-01-07 DIAGNOSIS — C50312 Malignant neoplasm of lower-inner quadrant of left female breast: Secondary | ICD-10-CM | POA: Diagnosis not present

## 2021-01-07 DIAGNOSIS — R293 Abnormal posture: Secondary | ICD-10-CM

## 2021-01-07 DIAGNOSIS — Z483 Aftercare following surgery for neoplasm: Secondary | ICD-10-CM

## 2021-01-07 NOTE — Therapy (Signed)
[name], [place], [address] Phone: [phone]   Fax:  [phone]  Physical Therapy Treatment  Patient Details  Name: Valerie [name] MRN: [id number] Date of Birth: July 16, 1970 Referring Provider (PT): Dr. Erroll Luna   Encounter Date: 01/07/2021   PT End of Session - 01/07/21 0945     Visit Number 8    Number of Visits 10    Date for PT Re-Evaluation 02/18/21    PT Start Time 0830    PT Stop Time 0915    PT Time Calculation (min) 45 min    Activity Tolerance Patient tolerated treatment well    Behavior During Therapy [facility] for tasks assessed/performed             Past Medical History:  Diagnosis Date   Cancer ([place])    left breast   Hashimoto's disease    Hypothyroidism    Mitral regurgitation    mild by echo 06/2020   Myelin oligodendrocyte glycoprotein antibody disorder (MOGAD) ([place])    Optic neuritis     Past Surgical History:  Procedure Laterality Date   BIOPSY BREAST  10/17/2020   no results yet   BREAST LUMPECTOMY WITH RADIOACTIVE SEED AND SENTINEL LYMPH NODE BIOPSY Left 11/15/2020   Procedure: LEFT BREAST LUMPECTOMY WITH RADIOACTIVE SEED AND LEFT SENTINEL LYMPH NODE BIOPSY;  Surgeon: Erroll Luna, MD;  Location: [facility];  Service: General;  Laterality: Left;   TUBAL LIGATION  02/2012    There were no vitals filed for this visit.   Subjective Assessment - 01/07/21 0823     Subjective I think it is just irritated all the time now and I can't tell which bra to use.  I am starting to feel sunburn.    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 It measures 6 mm in the lower inner quadrant. it is ER/PR positive and HER2 negaitve with a Ki67 of < 5%. 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 She is  treated with prednisone which impacts fluid retention.  She had left lumpectomy on 11/15/2020 with 0/3 lymph nodes removed. She says that she has a problem with passing out with medical things but can let us know if she feels this might happen.    Currently in Pain? Yes    Pain Score 1     Pain Location Breast    Pain Orientation Left    Pain Descriptors / Indicators Sore    Pain Type Acute pain    Pain Onset In the past 7 days    Pain Frequency Constant    Aggravating Factors  using bra - friction                               [facility] Adult PT Treatment/Exercise - 01/07/21 0001       Exercises   Other Exercises  2# scaption 2x10, wall chest stretch x 20", tricep stretch x 20", bicep curls 2# x 10, row 2# x 10, tricep extension 2# 2x10 from strength ABC      Shoulder Exercises: Pulleys   Flexion 2 minutes    ABduction 2 minutes      Shoulder Exercises: Therapy Ball   Flexion Both;10 reps      Manual Therapy   Passive ROM  Left shoulder flexion, abd, ER, diagonals to help improve ROM and decrease tightness                          PT Long Term Goals - 12/06/20 1730       PT LONG TERM GOAL #1   Title Patient will demonstrate she has regained full shoulder ROM and function post operatively compared to baselines.    Time 8    Period Weeks    Status On-going      PT LONG TERM GOAL #2   Title Pt will report the discomfort in her arm and axilla will have decreased by 75% so that she is able to sleep through the night    Time 4    Period Weeks    Status New      PT LONG TERM GOAL #3   Title Pt will be independent in a home exercise for shoulder ROM    Time 4    Period Weeks    Status New                   Plan - 01/07/21 0945     Clinical Impression Statement Pt is starting to have some nipple and breast discomfort so we limited STM and focused on strength ABC and stretch review which is going well.  Cancelled pts wed appt and  scheduled SOZO and post radiation follow up for 3weeks post radiation.    PT Next Visit Plan post radiation ROM check and SOZO    Consulted and Agree with Plan of Care Patient             Patient will benefit from skilled therapeutic intervention in order to improve the following deficits and impairments:     Visit Diagnosis: Aftercare following surgery for neoplasm  Abnormal posture  Malignant neoplasm of lower-inner quadrant of left breast in female, estrogen receptor positive [facility])     Problem List Patient Active Problem List   Diagnosis Date Noted   Malignant neoplasm of lower-inner quadrant of left breast in female, estrogen receptor positive ([place]) 10/23/2020   Optic neuritis, left 10/18/2020   Myelin oligodendrocyte glycoprotein antibody disorder (MOGAD) ([place]) 07/10/2020   Mitral regurgitation    High risk medication use 05/07/2020   Neuromyelitis optica (devic) ([place]) 05/07/2020   Hypothyroid 04/25/2020   Optic neuritis, right 04/24/2020    Stark Bray, PT 01/07/2021, 9:48 AM  [place], [place], [address] Phone: [phone]   Fax:  [phone]  Name: Valerie [name] MRN: [id number] Date of Birth: May 22, 1970

## 2021-01-08 ENCOUNTER — Ambulatory Visit
Admission: RE | Admit: 2021-01-08 | Discharge: 2021-01-08 | Disposition: A | Discharge status: Home / Self Care | Payer: Commercial Managed Care - PPO | Source: Ambulatory Visit | Attending: Radiation Oncology | Admitting: Radiation Oncology

## 2021-01-08 ENCOUNTER — Inpatient Hospital Stay: Payer: Commercial Managed Care - PPO | Attending: Hematology and Oncology

## 2021-01-08 DIAGNOSIS — Z170 Estrogen receptor positive status [ER+]: Secondary | ICD-10-CM | POA: Diagnosis not present

## 2021-01-08 DIAGNOSIS — C50312 Malignant neoplasm of lower-inner quadrant of left female breast: Secondary | ICD-10-CM

## 2021-01-08 DIAGNOSIS — Z923 Personal history of irradiation: Secondary | ICD-10-CM | POA: Diagnosis not present

## 2021-01-09 ENCOUNTER — Encounter: Payer: Self-pay | Admitting: Hematology and Oncology

## 2021-01-09 ENCOUNTER — Other Ambulatory Visit: Payer: Self-pay

## 2021-01-09 ENCOUNTER — Ambulatory Visit
Admission: RE | Admit: 2021-01-09 | Discharge: 2021-01-09 | Disposition: A | Discharge status: Home / Self Care | Payer: Commercial Managed Care - PPO | Source: Ambulatory Visit | Attending: Radiation Oncology | Admitting: Radiation Oncology

## 2021-01-09 ENCOUNTER — Ambulatory Visit: Payer: Commercial Managed Care - PPO | Admitting: Physical Therapy

## 2021-01-09 DIAGNOSIS — C50312 Malignant neoplasm of lower-inner quadrant of left female breast: Secondary | ICD-10-CM | POA: Diagnosis not present

## 2021-01-09 LAB — FOLLICLE STIMULATING HORMONE: FSH: 7.3 m[IU]/mL

## 2021-01-10 ENCOUNTER — Other Ambulatory Visit: Payer: Self-pay

## 2021-01-10 ENCOUNTER — Ambulatory Visit
Admission: RE | Admit: 2021-01-10 | Discharge: 2021-01-10 | Disposition: A | Discharge status: Home / Self Care | Payer: Commercial Managed Care - PPO | Source: Ambulatory Visit | Attending: Radiation Oncology | Admitting: Radiation Oncology

## 2021-01-10 DIAGNOSIS — C50312 Malignant neoplasm of lower-inner quadrant of left female breast: Secondary | ICD-10-CM | POA: Diagnosis not present

## 2021-01-11 ENCOUNTER — Other Ambulatory Visit: Payer: Self-pay

## 2021-01-11 ENCOUNTER — Ambulatory Visit
Admission: RE | Admit: 2021-01-11 | Discharge: 2021-01-11 | Disposition: A | Discharge status: Home / Self Care | Payer: Commercial Managed Care - PPO | Source: Ambulatory Visit | Attending: Radiation Oncology | Admitting: Radiation Oncology

## 2021-01-11 ENCOUNTER — Ambulatory Visit: Payer: Commercial Managed Care - PPO | Admitting: Radiation Oncology

## 2021-01-11 DIAGNOSIS — C50312 Malignant neoplasm of lower-inner quadrant of left female breast: Secondary | ICD-10-CM | POA: Diagnosis not present

## 2021-01-13 DIAGNOSIS — Z170 Estrogen receptor positive status [ER+]: Secondary | ICD-10-CM | POA: Insufficient documentation

## 2021-01-13 DIAGNOSIS — C50312 Malignant neoplasm of lower-inner quadrant of left female breast: Secondary | ICD-10-CM | POA: Insufficient documentation

## 2021-01-13 DIAGNOSIS — Z510 Encounter for antineoplastic radiation therapy: Secondary | ICD-10-CM | POA: Insufficient documentation

## 2021-01-13 LAB — ESTRADIOL, ULTRA SENS: Estradiol, Sensitive: 57.8 pg/mL

## 2021-01-14 ENCOUNTER — Ambulatory Visit
Admission: RE | Admit: 2021-01-14 | Discharge: 2021-01-14 | Disposition: A | Discharge status: Home / Self Care | Payer: Commercial Managed Care - PPO | Source: Ambulatory Visit | Attending: Radiation Oncology | Admitting: Radiation Oncology

## 2021-01-14 ENCOUNTER — Other Ambulatory Visit: Payer: Self-pay

## 2021-01-14 DIAGNOSIS — C50312 Malignant neoplasm of lower-inner quadrant of left female breast: Secondary | ICD-10-CM | POA: Diagnosis not present

## 2021-01-14 NOTE — Progress Notes (Signed)
Patient Care Team: Carolee Rota, NP as PCP - General (Nurse Practitioner) Sueanne Margarita, MD as PCP - Cardiology (Cardiology) Mauro Kaufmann, RN as Oncology Nurse Navigator Rockwell Germany, RN as Oncology Nurse Navigator Erroll Luna, MD as Consulting Physician (General Surgery) Nicholas Lose, MD as Consulting Physician (Hematology and Oncology) Kyung Rudd, MD as Consulting Physician (Radiation Oncology)  DIAGNOSIS:    ICD-10-CM   1. Malignant neoplasm of lower-inner quadrant of left breast in female, estrogen receptor positive ([place])  C50.312    Z17.0       SUMMARY OF ONCOLOGIC HISTORY: Oncology History  Malignant neoplasm of lower-inner quadrant of left breast in female, estrogen receptor positive ([place])  10/23/2020 Initial Diagnosis   Screening mammogram on 09/11/20 showed possible distortion with calcifications in the left breast. Diagnostic mammogram and [place] on 10/03/20 showed 0.6 cm group of indeterminate lower inner left breast calcifications with possible associated subtle distortion and no abnormal appearing left axillary lymph nodes. Biopsy on 10/17/20 showed invasive ductal carcinoma, DCIS with calcifications, ER+(90%)/PR(95%)/Ki67(<5%).   10/24/2020 Cancer Staging   Staging form: Breast, AJCC 8th Edition - Clinical stage from 10/24/2020: Stage IA (cT1b, cN0, cM0, G1, ER+, PR+, HER2-) - Signed by Nicholas Lose, MD on 10/24/2020 Stage prefix: Initial diagnosis Histologic grading system: 3 grade system Laterality: Left Staged by: Pathologist and managing physician Stage used in treatment planning: Yes National guidelines used in treatment planning: Yes Type of national guideline used in treatment planning: NCCN   11/15/2020 Surgery   Left lumpectomy: No residual cancer, fibrocystic change, margins negative, 0/3 lymph nodes negative (based on the biopsy tumor size: 2 mm), previous ER 90%, PR 95%, HER2 negative, Ki-67 less than 5%   12/25/2020 - 01/18/2021 Radiation  Therapy   Adjuvant radiation     CHIEF COMPLIANT: Follow-up of left breast cancer  INTERVAL HISTORY: Valerie [name] is a 50 y.o. with above-mentioned history of left breast cancer, currently on radiation therapy. She presents to the clinic today for follow-up.  She is complaining of radiation dermatitis but is able to withstand it.  She is very uncomfortable in the breast.  Today she is here to discuss antiestrogen therapy.  ALLERGIES:  has No Known Allergies.  MEDICATIONS:  Current Outpatient Medications  Medication Sig Dispense Refill   Ascorbic Acid (VITA-C PO) Take 1,000 mg by mouth daily.     cholecalciferol (VITAMIN D3) 25 MCG (1000 UNIT) tablet Take 5,000 Units by mouth daily.     levothyroxine (SYNTHROID) 100 MCG tablet Take 100 mcg by mouth daily.     predniSONE (DELTASONE) 5 MG tablet Take up to 3 pills daily as directed (Patient taking differently: 10 mg. Every other day) 100 tablet 3   Probiotic Product (CULTRELLE KIDS IMMUNE DEFENSE PO) Take 1 tablet by mouth daily.     zinc gluconate 50 MG tablet Take 50 mg by mouth daily.     No current facility-administered medications for this visit.    PHYSICAL EXAMINATION: ECOG PERFORMANCE STATUS: 1 - Symptomatic but completely ambulatory  Vitals:   01/15/21 0823  BP: (!) 113/56  Pulse: 82  Resp: 19  Temp: 97.8 F (36.6 C)  SpO2: 99%   Filed Weights   01/15/21 0823  Weight: 185 lb 11.2 oz (84.2 kg)       LABORATORY DATA:  I have reviewed the data as listed CMP Latest Ref Rng & Units 10/24/2020 07/05/2020 05/06/2020  Glucose 70 - 99 mg/dL 87 103(H) 97  BUN 6 - 20  mg/dL 23(H) 16 13  Creatinine 0.44 - 1.00 mg/dL 0.97 0.92 1.05(H)  Sodium 135 - 145 mmol/L 143 137 138  Potassium 3.5 - 5.1 mmol/L 4.6 4.8 4.5  Chloride 98 - 111 mmol/L 109 98 105  CO2 22 - 32 mmol/L _0 Calcium 8.9 - 10.3 mg/dL 9.0 9.2 8.6(L)  Total Protein 6.5 - 8.1 g/dL 6.8 - 6.7  Total Bilirubin 0.3 - 1.2 mg/dL 0.3 - 0.4  Alkaline Phos 38 -  126 U/L 41 - 41  AST 15 - 41 U/L 17 - 15  ALT 0 - 44 U/L 16 - 27    Lab Results  Component Value Date   WBC 6.8 10/24/2020   HGB 14.6 10/24/2020   HCT 42.6 10/24/2020   MCV 94.9 10/24/2020   PLT 256 10/24/2020   NEUTROABS 4.8 10/24/2020    ASSESSMENT & PLAN:  Malignant neoplasm of lower-inner quadrant of left breast in female, estrogen receptor positive ([place]) 10/23/2020 screening mammogram on 09/11/20 showed possible distortion with calcifications in the left breast. Diagnostic mammogram and [place] on 10/03/20 showed 0.6 cm group of indeterminate lower inner left breast calcifications with possible associated subtle distortion and no abnormal appearing left axillary lymph nodes. Biopsy on 10/17/20 showed invasive ductal carcinoma, DCIS with calcifications, ER+(90%)/PR(95%)/Ki67(<5%).  11/15/2020:Left lumpectomy: No residual cancer, fibrocystic change, margins negative, 0/3 lymph nodes negative (based on the biopsy tumor size: 2 mm), previous ER 90%, PR 95%, HER2 negative, Ki-67 less than 5%  Adjuvant radiation: 12/25/2020-01/18/2021  Treatment plan: Adjuvant antiestrogen therapy with tamoxifen 20 mg daily x5 to 10 years Tamoxifen counseling: We discussed the risks and benefits of tamoxifen. These include but not limited to insomnia, hot flashes, mood changes, vaginal dryness, and weight gain. Although rare, serious side effects including endometrial cancer, risk of blood clots were also discussed. We strongly believe that the benefits far outweigh the risks. Patient understands these risks and consented to starting treatment. Planned treatment duration is 5-10 years.  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  Return to clinic in 2 months for survivorship care plan visit.   No orders of the defined types were placed in this encounter.  The patient has a good  understanding of the overall plan. she agrees with it. she will call with any problems that may develop before the next visit here.  Total time spent: 30 mins including face to face time and time spent for planning, charting and coordination of care  Rulon Eisenmenger, MD, MPH 01/15/2021  I, Thana Ates, am acting as scribe for Dr. Nicholas Lose.  I have reviewed the above documentation for accuracy and completeness, and I agree with the above.

## 2021-01-15 ENCOUNTER — Inpatient Hospital Stay: Payer: Commercial Managed Care - PPO | Attending: Hematology and Oncology | Admitting: Hematology and Oncology

## 2021-01-15 ENCOUNTER — Ambulatory Visit
Admission: RE | Admit: 2021-01-15 | Discharge: 2021-01-15 | Disposition: A | Discharge status: Home / Self Care | Payer: Commercial Managed Care - PPO | Source: Ambulatory Visit | Attending: Radiation Oncology | Admitting: Radiation Oncology

## 2021-01-15 DIAGNOSIS — Z510 Encounter for antineoplastic radiation therapy: Secondary | ICD-10-CM | POA: Insufficient documentation

## 2021-01-15 DIAGNOSIS — Z170 Estrogen receptor positive status [ER+]: Secondary | ICD-10-CM | POA: Diagnosis not present

## 2021-01-15 DIAGNOSIS — C50312 Malignant neoplasm of lower-inner quadrant of left female breast: Secondary | ICD-10-CM | POA: Diagnosis not present

## 2021-01-15 MED ORDER — TAMOXIFEN CITRATE 20 MG PO TABS: 20.0000 mg | ORAL_TABLET | Freq: Every day | ORAL | 3 refills | Status: DC

## 2021-01-15 NOTE — Assessment & Plan Note (Signed)
10/23/2020 screening mammogram on 09/11/20 showed possible distortion with calcifications in the left breast. Diagnostic mammogram and [place] on 10/03/20 showed 0.6 cm group of indeterminate lower inner left breast calcifications with possible associated subtle distortion and no abnormal appearing left axillary lymph nodes. Biopsy on 10/17/20 showed invasive ductal carcinoma, DCIS with calcifications, ER+(90%)/PR(95%)/Ki67(<5%).  11/15/2020:Left lumpectomy: No residual cancer, fibrocystic change, margins negative, 0/3 lymph nodes negative (based on the biopsy tumor size: 2 mm), previous ER 90%, PR 95%, HER2 negative, Ki-67 less than 5%  Adjuvant radiation: 12/25/2020-01/18/2021  Treatment plan: Adjuvant antiestrogen therapy

## 2021-01-16 ENCOUNTER — Other Ambulatory Visit: Payer: Self-pay

## 2021-01-16 ENCOUNTER — Ambulatory Visit
Admission: RE | Admit: 2021-01-16 | Discharge: 2021-01-16 | Disposition: A | Discharge status: Home / Self Care | Payer: Commercial Managed Care - PPO | Source: Ambulatory Visit | Attending: Radiation Oncology | Admitting: Radiation Oncology

## 2021-01-16 DIAGNOSIS — C50312 Malignant neoplasm of lower-inner quadrant of left female breast: Secondary | ICD-10-CM | POA: Diagnosis not present

## 2021-01-17 ENCOUNTER — Encounter: Payer: Self-pay | Admitting: *Deleted

## 2021-01-17 ENCOUNTER — Ambulatory Visit
Admission: RE | Admit: 2021-01-17 | Discharge: 2021-01-17 | Disposition: A | Discharge status: Home / Self Care | Payer: Commercial Managed Care - PPO | Source: Ambulatory Visit | Attending: Radiation Oncology | Admitting: Radiation Oncology

## 2021-01-17 DIAGNOSIS — C50312 Malignant neoplasm of lower-inner quadrant of left female breast: Secondary | ICD-10-CM | POA: Diagnosis not present

## 2021-01-18 ENCOUNTER — Encounter: Payer: Self-pay | Admitting: Radiation Oncology

## 2021-01-18 ENCOUNTER — Other Ambulatory Visit: Payer: Self-pay | Admitting: *Deleted

## 2021-01-18 ENCOUNTER — Ambulatory Visit
Admission: RE | Admit: 2021-01-18 | Discharge: 2021-01-18 | Disposition: A | Discharge status: Home / Self Care | Payer: Commercial Managed Care - PPO | Source: Ambulatory Visit | Attending: Radiation Oncology | Admitting: Radiation Oncology

## 2021-01-18 ENCOUNTER — Other Ambulatory Visit: Payer: Self-pay

## 2021-01-18 DIAGNOSIS — C50312 Malignant neoplasm of lower-inner quadrant of left female breast: Secondary | ICD-10-CM | POA: Diagnosis not present

## 2021-01-18 MED ORDER — TAMOXIFEN CITRATE 20 MG PO TABS
20.0000 mg | ORAL_TABLET | Freq: Every day | ORAL | 3 refills | Status: DC
Start: 2021-01-18 — End: 2021-06-04

## 2021-01-28 ENCOUNTER — Encounter: Payer: Self-pay | Admitting: Hematology and Oncology

## 2021-01-28 NOTE — Progress Notes (Signed)
                                                                                                                                                             Patient Name: VONZELLA ALTHAUS MRN: [id number] DOB: Apr 02, 1971 Referring Physician: Carolee Rota (Profile Not Attached) Date of Service: 01/18/2021  [facility] [place]                                                        End Of Treatment Note  Diagnoses: C50.312-Malignant neoplasm of lower-inner quadrant of left female breast  Cancer Staging: Stage IA, cT1bN0M0 grade 1 ER/PR positive invasive ductal carcinoma of the left breast.   Intent: Curative  Radiation Treatment Dates: 12/24/2020 through 01/18/2021 Site Technique Total Dose (Gy) Dose per Fx (Gy) Completed Fx Beam Energies  Breast, Left: Breast_Lt 3D 42.56/42.56 2.66 16/16 6XFFF  Breast, Left: Breast_Lt_Bst 3D 8/8 2 4/4 6X, 10X   Narrative: The patient tolerated radiation therapy relatively well. She developed   anticipated skin changes in the treatment field but no complaints of fatigue were noted at the completion of her treatment.   Plan: The patient will receive a call in about one month from the radiation oncology department. She will continue follow up with Dr. Lindi Adie as well.   ________________________________________________    Carola Rhine, [facility]

## 2021-02-12 ENCOUNTER — Ambulatory Visit
Admission: RE | Admit: 2021-02-12 | Discharge: 2021-02-12 | Disposition: A | Discharge status: Home / Self Care | Payer: Commercial Managed Care - PPO | Source: Ambulatory Visit | Attending: Radiation Oncology | Admitting: Radiation Oncology

## 2021-02-12 DIAGNOSIS — C50312 Malignant neoplasm of lower-inner quadrant of left female breast: Secondary | ICD-10-CM

## 2021-02-12 DIAGNOSIS — Z170 Estrogen receptor positive status [ER+]: Secondary | ICD-10-CM | POA: Insufficient documentation

## 2021-02-12 NOTE — Progress Notes (Signed)
  Radiation Oncology         [phone] ________________________________  Name: Valerie [name] MRN: [id number]  Date of Service: 02/12/2021  DOB: Aug 24, 1970  Post Treatment Telephone Note  Diagnosis:   Stage IA, cT1bN0M0 grade 1 ER/PR positive invasive ductal carcinoma of the left breast.   Interval Since Last Radiation:  4 weeks   12/24/2020 through 01/18/2021 Site Technique Total Dose (Gy) Dose per Fx (Gy) Completed Fx Beam Energies  Breast, Left: Breast_Lt 3D 42.56/42.56 2.66 16/16 6XFFF  Breast, Left: Breast_Lt_Bst 3D 8/8 2 4/4 6X, 10X    Narrative:  The patient was contacted today for routine follow-up. During treatment she did very well with radiotherapy and did not have significant desquamation. She reports she is doing pretty well with her skin and still has some sensitivity of her skin. She is also doing exercises from PT but thinks she has some more cording developing in her axilla. She did notice this was better since starting prednisone for her MOGAD neurologic condition. She sees PT on Thursday.   Impression/Plan: 1. 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 . The patient has been doing well since completion of radiotherapy. We discussed that we would be happy to continue to follow her as needed, but she will also continue to follow up with Dr. Lindi Adie in medical oncology. She was counseled on skin care as well as measures to avoid sun exposure to this area.  2. Survivorship. We discussed the importance of survivorship evaluation and encouraged her to attend her upcoming visit with that clinic. 3. Axillary cording. I encouraged her to continue her visits with PT regarding this.  4. MOGAD. She will follow up with Dr. Felecia Shelling in the long term as she continues surveillance for #1.       Carola Rhine, PAC

## 2021-02-13 ENCOUNTER — Encounter: Payer: Self-pay | Admitting: Rehabilitation

## 2021-02-14 ENCOUNTER — Other Ambulatory Visit: Payer: Self-pay

## 2021-02-14 ENCOUNTER — Ambulatory Visit: Payer: Commercial Managed Care - PPO | Attending: Surgery

## 2021-02-14 VITALS — Wt 184.5 lb

## 2021-02-14 DIAGNOSIS — R600 Localized edema: Secondary | ICD-10-CM | POA: Insufficient documentation

## 2021-02-14 DIAGNOSIS — Z170 Estrogen receptor positive status [ER+]: Secondary | ICD-10-CM | POA: Insufficient documentation

## 2021-02-14 DIAGNOSIS — Z483 Aftercare following surgery for neoplasm: Secondary | ICD-10-CM | POA: Diagnosis present

## 2021-02-14 DIAGNOSIS — M25612 Stiffness of left shoulder, not elsewhere classified: Secondary | ICD-10-CM | POA: Diagnosis present

## 2021-02-14 DIAGNOSIS — R293 Abnormal posture: Secondary | ICD-10-CM | POA: Diagnosis present

## 2021-02-14 DIAGNOSIS — C50312 Malignant neoplasm of lower-inner quadrant of left female breast: Secondary | ICD-10-CM | POA: Insufficient documentation

## 2021-02-14 NOTE — Therapy (Addendum)
[facility] @ [facility] [place], [place], [address] Phone: [phone]   Fax:  [phone]  Physical Therapy Treatment  Patient Details  Name: Valerie [name] MRN: [id number] Date of Birth: Aug 13, 1970 Referring Provider (PT): Dr. Erroll Luna   Encounter Date: 02/14/2021   PT End of Session - 02/14/21 0954     Visit Number 9    Number of Visits 10    Date for PT Re-Evaluation 02/18/21    PT Start Time 0905    PT Stop Time 1012    PT Time Calculation (min) 67 min    Activity Tolerance Patient tolerated treatment well    Behavior During Therapy [facility] for tasks assessed/performed             Past Medical History:  Diagnosis Date   Cancer ([place])    left breast   Hashimoto's disease    Hypothyroidism    Mitral regurgitation    mild by echo 06/2020   Myelin oligodendrocyte glycoprotein antibody disorder (MOGAD) ([place])    Optic neuritis     Past Surgical History:  Procedure Laterality Date   BIOPSY BREAST  10/17/2020   no results yet   BREAST LUMPECTOMY WITH RADIOACTIVE SEED AND SENTINEL LYMPH NODE BIOPSY Left 11/15/2020   Procedure: LEFT BREAST LUMPECTOMY WITH RADIOACTIVE SEED AND LEFT SENTINEL LYMPH NODE BIOPSY;  Surgeon: Erroll Luna, MD;  Location: [place];  Service: General;  Laterality: Left;   TUBAL LIGATION  02/2012    Vitals:   02/14/21 0954  Weight: 184 lb 8 oz (83.7 kg)     Subjective Assessment - 02/14/21 0907     Subjective I finished radiation a few weeks ago and I think my skin did really well. I've had some peeling but I put the cream on 3x/day and have tapered that down and now just prn because my skin is doing so well. I had some cording develop in about a week ago so I started doing my stretches more consistently again and it's already getting better.    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 It measures 6  mm in the lower inner quadrant. it is ER/PR positive and HER2 negaitve with a Ki67 of < 5%. 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 She is treated with prednisone which impacts fluid retention.  She had left lumpectomy on 11/15/2020 with 0/3 lymph nodes removed. She says that she has a problem with passing out with medical things but can let us know if she feels this might happen.    Patient Stated Goals get rid of the arm discomfort    Currently in Pain? No/denies                [facility] PT Assessment - 02/14/21 0001       AROM   Left Shoulder Flexion 155 Degrees    Left Shoulder ABduction 176 Degrees                L-DEX FLOWSHEETS - 02/14/21 0900       L-DEX LYMPHEDEMA SCREENING   Measurement Type Unilateral    L-DEX MEASUREMENT EXTREMITY Upper Extremity    POSITION  Standing    DOMINANT SIDE Right    At Risk Side Left    BASELINE SCORE (UNILATERAL) 2.5    L-DEX SCORE (UNILATERAL) 15.4  VALUE CHANGE (UNILAT) 12.9                       [facility] Adult PT Treatment/Exercise - 02/14/21 0001       Self-Care   Self-Care Other Self-Care Comments    Other Self-Care Comments  Discussed with pt her current functional status since completing radiation. She had started to develop more cording in past week but was able to reduce this with HEP stretches. Advised her that to promote a better healing environment she should be wearing her compression bras as often as possible during day and night (she reports had stopped wearing these) and signs/symptoms to look for if she starts to develop lymphedema in the breast over next few months since just completing radiation. Also encouraged her to be more consistent with HEP stretches and Strength ABC program as she reports she hasn't been as consistent with this as of late until the cording started. Pt able to verbalize good  understanding of all.      Manual Therapy   Manual therapy comments Assessed skin and Lt breast since completing radiation. Appears to be healing well with only mild redness and dryness present.    Passive ROM Left shoulder flexion, abd, D2 to help improve ROM and decrease tightness                          PT Long Term Goals - 02/14/21 0916       PT LONG TERM GOAL #2   Title Pt will report the discomfort in her arm and axilla will have decreased by 75% so that she is able to sleep through the night    Baseline 100% improved - 02/14/21    Status Achieved                   Plan - 02/14/21 1256     Clinical Impression Statement Pt returns after having completed radiation about 3 weeks ago. She has been stretching and applying radioplex to help her skin heal. She still has some redness and dryness but overall her skin appears to be healing well. Continued with P/ROM to Lt shoulder where some very mild cording palpable in upper arm but this was not noticeable by end of session. Pt was due for her 3 month SOZO screen. Her change from baseline was elevated to 12.9 which indicates lymphedema present so pt was issued a compression sleeve and gauntlet. Instructed her to wear this 10 hrs/day, not to sleep in garment and washing/care instructions were in the boxes issued to her that the garments came in. Pt able to verbalize goo dunderstanding of this. She will need to be renewed for physical therapy at next session so we can instruct her in self MLD for the Lt UE and see determine how she is tolerating the compression sleeve as she may need to be banadaged if the sleeve begins to cause discomfort due to her high change from baseline.    Stability/Clinical Decision Making Stable/Uncomplicated    Rehab Potential Excellent    PT Frequency 2x / week    PT Duration 4 weeks    PT Treatment/Interventions ADLs/Self Care Home Management;Therapeutic exercise;Patient/family  education;Manual lymph drainage;Compression bandaging;Manual techniques;Passive range of motion    PT Next Visit Plan Renew next; SOZO possibly next session to see if sleeve is being effective? take circumference measurements; issue script for [facility] (Dr. Lindi Adie) ;instruct pt in manual  lymph drainage for the Lt UE (she knows breast lymphedema so needs to be instructed in the arm sequence)   PT Home Exercise Plan Post op shoulder ROM HEP, dowel exercises; wear compression sleeve and gauntlet daily for 1 month    Consulted and Agree with Plan of Care Patient             Patient will benefit from skilled therapeutic intervention in order to improve the following deficits and impairments:  Postural dysfunction, Decreased range of motion, Decreased knowledge of precautions, Impaired UE functional use, Pain, Increased edema  Visit Diagnosis: Aftercare following surgery for neoplasm  Abnormal posture  Malignant neoplasm of lower-inner quadrant of left breast in female, estrogen receptor positive 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, [place], [address] Phone: [phone]   Fax:  [phone]  Name: Valerie [name] MRN: [id number] Date of Birth: 10-08-1970

## 2021-02-15 ENCOUNTER — Telehealth: Payer: Self-pay

## 2021-02-15 NOTE — Telephone Encounter (Signed)
Pt had a few questions about her compression sleeve and gauntlet issued to her at last appointment. Advised her it's okay to wear if it feels tight but to remove if increased discomfort or tingling or numbness becomes present in limb. Also advised her that we will give her a script for compression garments at next session as she is requesting. All questions were answered and pt knows she can reach back out to this therapist over the weekend if other questions arise before her next scheduled appt with our clinic Monday afternoon. She verbalized good understanding of all addressed.

## 2021-02-18 ENCOUNTER — Other Ambulatory Visit: Payer: Self-pay

## 2021-02-18 ENCOUNTER — Ambulatory Visit: Payer: Commercial Managed Care - PPO

## 2021-02-18 DIAGNOSIS — Z483 Aftercare following surgery for neoplasm: Secondary | ICD-10-CM

## 2021-02-18 DIAGNOSIS — R600 Localized edema: Secondary | ICD-10-CM

## 2021-02-18 DIAGNOSIS — C50312 Malignant neoplasm of lower-inner quadrant of left female breast: Secondary | ICD-10-CM

## 2021-02-18 DIAGNOSIS — M25612 Stiffness of left shoulder, not elsewhere classified: Secondary | ICD-10-CM

## 2021-02-18 DIAGNOSIS — R293 Abnormal posture: Secondary | ICD-10-CM

## 2021-02-18 NOTE — Patient Instructions (Signed)
Pt given written instructions for left UE MLD, and script for compression sleeve and glove

## 2021-02-18 NOTE — Therapy (Signed)
[facility] @ [place], [place], [address] Phone: [phone]   Fax:  [phone]  Physical Therapy Treatment  Patient Details  Name: Valerie [name] MRN: [id number] Date of Birth: 1970/10/29 Referring Provider (PT): Dr. Erroll Luna   Encounter Date: 02/18/2021   PT End of Session - 02/18/21 1729     Visit Number 10    Number of Visits 18    Date for PT Re-Evaluation 03/18/21    PT Start Time 8264    PT Stop Time 1583    PT Time Calculation (min) 69 min    Activity Tolerance Patient tolerated treatment well    Behavior During Therapy [facility] for tasks assessed/performed             Past Medical History:  Diagnosis Date   Cancer ([place])    left breast   Hashimoto's disease    Hypothyroidism    Mitral regurgitation    mild by echo 06/2020   Myelin oligodendrocyte glycoprotein antibody disorder (MOGAD) ([place])    Optic neuritis     Past Surgical History:  Procedure Laterality Date   BIOPSY BREAST  10/17/2020   no results yet   BREAST LUMPECTOMY WITH RADIOACTIVE SEED AND SENTINEL LYMPH NODE BIOPSY Left 11/15/2020   Procedure: LEFT BREAST LUMPECTOMY WITH RADIOACTIVE SEED AND LEFT SENTINEL LYMPH NODE BIOPSY;  Surgeon: Erroll Luna, MD;  Location: [place];  Service: General;  Laterality: Left;   TUBAL LIGATION  02/2012    There were no vitals filed for this visit.   Subjective Assessment - 02/18/21 1605     Subjective Skin is doing pretty well . I continued the radioplex and it feels pretty good. I have been wearing the sleeve and gauntlet since last Thursday. It has been fairly comfortable, but I think I would like to get another one.  I have been wearing the sleeve and gauntlet for 10 hrs a day since I was here last.  I still feel the cording just a little bit.    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 It measures 6 mm  in the lower inner quadrant. it is ER/PR positive and HER2 negaitve with a Ki67 of < 5%. 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 She is treated with prednisone which impacts fluid retention.  She had left lumpectomy on 11/15/2020 with 0/3 lymph nodes removed. She says that she has a problem with passing out with medical things but can let us know if she feels this might happen.    Patient Stated Goals get rid of the arm discomfort    Currently in Pain? No/denies    Pain Score 0-No pain                [facility] PT Assessment - 02/18/21 0001       Assessment   Medical Diagnosis Left breast cancer    Referring Provider (PT) Dr. Marcello Moores [place]    Onset Date/Surgical Date 09/11/20    Hand Dominance Right      Prior Function   Level of Independence Independent      Cognition   Overall Cognitive Status Within Functional Limits for tasks assessed      Observation/Other Assessments   Observations small cord still noted in axilla, wrist and forearm with slight visual and measureable swelling  LYMPHEDEMA/ONCOLOGY QUESTIONNAIRE - 02/18/21 0001       Type   Cancer Type Left breast cancer      Right Upper Extremity Lymphedema   15 cm Proximal to Olecranon Process 33 cm    10 cm Proximal to Olecranon Process 32.1 cm    Olecranon Process 24.8 cm    15 cm Proximal to Ulnar Styloid Process 23.7 cm    10 cm Proximal to Ulnar Styloid Process 21.4 cm    Just Proximal to Ulnar Styloid Process 15.3 cm    Across Hand at [employer] 18.2 cm    At [place] of 2nd Digit 6.1 cm      Left Upper Extremity Lymphedema   15 cm Proximal to Olecranon Process 33 cm    10 cm Proximal to Olecranon Process 31.5 cm    Olecranon Process 26.4 cm    15 cm Proximal to Ulnar Styloid Process 24.8 cm    10 cm Proximal to Ulnar Styloid Process 22 cm    Just Proximal to Ulnar Styloid Process 15.7  cm    Across Hand at [employer] 18.4 cm    At [place] of 2nd Digit 6.4 cm             L-DEX FLOWSHEETS - 02/18/21 1600       L-DEX LYMPHEDEMA SCREENING   Measurement Type Unilateral (P)     L-DEX MEASUREMENT EXTREMITY Upper Extremity (P)     POSITION  Standing (P)     DOMINANT SIDE Right (P)     At Risk Side Left (P)     BASELINE SCORE (UNILATERAL) 2.5 (P)     L-DEX SCORE (UNILATERAL) 6.7 (P)     VALUE CHANGE (UNILAT) 4.2 (P)                        [facility] Adult PT Treatment/Exercise - 02/18/21 0001       Manual Therapy   Manual therapy comments Pts UE's remeasured and SOZO performed    Edema Management pt given script for compression sleeve/glove and will go to [facility].    Manual Lymphatic Drainage (MLD) Performed left UE MLD to supraclavicular, bilateral axillary LN's and Left inguinal LN's, anterior interaxillary pathway, and left axillo-inguinal pathway, left UE starting proximally and working distally and retracing all pathways and ending with LN"S.  Pt and her husband verbally instructed while I was performing and were given written instructions.                     PT Education - 02/18/21 1728     Education Details Verbally Educated in Left UE MLD while I was performing.  Despite good SOZO result swelling is visible and pt was advised to continue with her compression sleeve/gauntlet and to perform MLD 1x daily.    Person(s) Educated Patient;Spouse    Methods Explanation;Demonstration;Handout    Comprehension Verbalized understanding                 PT Long Term Goals - 02/14/21 0916       PT LONG TERM GOAL #2   Title Pt will report the discomfort in her arm and axilla will have decreased by 75% so that she is able to sleep through the night    Baseline 100% improved - 02/14/21    Status Achieved  Plan - 02/18/21 1730     Clinical Impression Statement Pt returns after having a high score in the  red range for her SOZO screen last week.  Pts arms were remeasured and there was visible and measureable edema in the left UE.  See flow sheet.  SOZO was repeated today which shows the pt. back in the green zone despite having some mild visible edema.  She and her husband were verbally and with demonstration instructed in MLD to the left UE although they did not physically perform and a handout given.  Pt was advised to continue wearing her sleeve on a daily basis for several more weeks since there is measureable difference and she was given a script to go [facility] another sleeve and glove. pt was also advised to continue supine wand, stargazer and abd. wall slides and door stretch to keep cording looser.  She will continue therapy 1x per week with emphasis on MFR to cording and MLD for Left UE, unless there are any changes which require increasing her frequency.    Stability/Clinical Decision Making Stable/Uncomplicated    Clinical Decision Making Low    Rehab Potential Excellent    PT Frequency 2x / week   but will schedule 1x per week to start   PT Duration 4 weeks    PT Treatment/Interventions ADLs/Self Care Home Management;Therapeutic exercise;Patient/family education;Manual lymph drainage;Compression bandaging;Manual techniques;Passive range of motion;Vasopneumatic Device    PT Next Visit Plan Review self MLD to left UE, did pt get measured for another sleeve, Continue sleeve several more weeks,remeasure prn, MFR to cording    PT Home Exercise Plan Post op shoulder ROM HEP, dowel exercises; wear compression sleeve and gauntlet daily for 1 month    Recommended Other Services pt wants to purchase another sleeve    Consulted and Agree with Plan of Care Patient    Family Member Consulted Husband             Patient will benefit from skilled therapeutic intervention in order to improve the following deficits and impairments:  Postural dysfunction, Decreased range of motion,  Decreased knowledge of precautions, Impaired UE functional use, Pain, Increased edema  Visit Diagnosis: Aftercare following surgery for neoplasm  Abnormal posture  Localized edema  Malignant neoplasm of lower-inner quadrant of left breast in female, estrogen receptor positive ([facility])  Stiffness of left shoulder, not elsewhere classified     Problem List Patient Active Problem List   Diagnosis Date Noted   Malignant neoplasm of lower-inner quadrant of left breast in female, estrogen receptor positive ([place]) 10/23/2020   Optic neuritis, left 10/18/2020   Myelin oligodendrocyte glycoprotein antibody disorder (MOGAD) ([place]) 07/10/2020   Mitral regurgitation    High risk medication use 05/07/2020   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

## 2021-02-19 ENCOUNTER — Encounter: Payer: Self-pay | Admitting: Physical Therapy

## 2021-02-21 ENCOUNTER — Encounter: Payer: Self-pay | Admitting: Rehabilitation

## 2021-02-25 ENCOUNTER — Telehealth: Payer: Self-pay | Admitting: Adult Health

## 2021-02-25 NOTE — Telephone Encounter (Signed)
Scheduled appointment per provider. Patient aware.  

## 2021-02-28 ENCOUNTER — Ambulatory Visit: Payer: Commercial Managed Care - PPO

## 2021-02-28 ENCOUNTER — Other Ambulatory Visit: Payer: Self-pay

## 2021-02-28 VITALS — Wt 185.1 lb

## 2021-02-28 DIAGNOSIS — M25612 Stiffness of left shoulder, not elsewhere classified: Secondary | ICD-10-CM

## 2021-02-28 DIAGNOSIS — R600 Localized edema: Secondary | ICD-10-CM

## 2021-02-28 DIAGNOSIS — Z170 Estrogen receptor positive status [ER+]: Secondary | ICD-10-CM

## 2021-02-28 DIAGNOSIS — Z483 Aftercare following surgery for neoplasm: Secondary | ICD-10-CM

## 2021-02-28 DIAGNOSIS — R293 Abnormal posture: Secondary | ICD-10-CM

## 2021-02-28 NOTE — Therapy (Signed)
[facility] @ [place], [place], [address] Phone: [phone]   Fax:  [phone]  Physical Therapy Treatment  Patient Details  Name: Valerie [name] MRN: [id number] Date of Birth: 01-16-1971 Referring Provider (PT): Dr. Erroll Luna   Encounter Date: 02/28/2021   PT End of Session - 02/28/21 0841     Visit Number 11    Number of Visits 18    Date for PT Re-Evaluation 03/18/21    PT Start Time 0806    PT Stop Time 0841    PT Time Calculation (min) 35 min    Activity Tolerance Patient tolerated treatment well    Behavior During Therapy [facility] for tasks assessed/performed             Past Medical History:  Diagnosis Date   Cancer ([place])    left breast   Hashimoto's disease    Hypothyroidism    Mitral regurgitation    mild by echo 06/2020   Myelin oligodendrocyte glycoprotein antibody disorder (MOGAD) ([place])    Optic neuritis     Past Surgical History:  Procedure Laterality Date   BIOPSY BREAST  10/17/2020   no results yet   BREAST LUMPECTOMY WITH RADIOACTIVE SEED AND SENTINEL LYMPH NODE BIOPSY Left 11/15/2020   Procedure: LEFT BREAST LUMPECTOMY WITH RADIOACTIVE SEED AND LEFT SENTINEL LYMPH NODE BIOPSY;  Surgeon: Erroll Luna, MD;  Location: [place];  Service: General;  Laterality: Left;   TUBAL LIGATION  02/2012    Vitals:   02/28/21 0817  Weight: 185 lb 2 oz (84 kg)             L-DEX FLOWSHEETS - 02/28/21 0800       L-DEX LYMPHEDEMA SCREENING   Measurement Type Unilateral    L-DEX MEASUREMENT EXTREMITY Upper Extremity    POSITION  Standing    DOMINANT SIDE Right    At Risk Side Left    BASELINE SCORE (UNILATERAL) 2.5    L-DEX SCORE (UNILATERAL) 8.8    VALUE CHANGE (UNILAT) 6.3                        [facility] Adult PT Treatment/Exercise - 02/28/21 0001       Self-Care   Other Self-Care Comments  Once noticed new redness and warmth to pts Lt  breast sent inbox message to Bary Castilla with a picture updating her about possible cellulitis. Then educated her about cellulitis, answering her questions, and then instructed her to hold off on self MLD until she hears from [facility]. And then if antibitics issued to hold MLD for a few days at least until symptoms begin to resolve. Pt verbalized good understanding.      Manual Therapy   Manual Lymphatic Drainage (MLD) In Supine:Supraclavicular, 5 diaphragmatic breaths, Rt axillary LN's and Left inguinal LN's, anterior interaxillary pathway and Lt axillo-inguinal anastomosis and then when pulled towel away to start on breast noticed increased redness and warmth to touch so stopped treament at this point.                          PT Long Term Goals - 02/14/21 0916       PT LONG TERM GOAL #2   Title Pt will report the discomfort in her arm and axilla will have decreased by 75% so that she is able to sleep through the night    Baseline  100% improved - 02/14/21    Status Achieved                    Patient will benefit from skilled therapeutic intervention in order to improve the following deficits and impairments:     Visit Diagnosis: Aftercare following surgery for neoplasm  Abnormal posture  Localized edema  Malignant neoplasm of lower-inner quadrant of left breast in female, estrogen receptor positive ([facility])  Stiffness of left shoulder, not elsewhere classified     Problem List Patient Active Problem List   Diagnosis Date Noted   Malignant neoplasm of lower-inner quadrant of left breast in female, estrogen receptor positive ([place]) 10/23/2020   Optic neuritis, left 10/18/2020   Myelin oligodendrocyte glycoprotein antibody disorder (MOGAD) ([place]) 07/10/2020   Mitral regurgitation    High risk medication use 05/07/2020   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

## 2021-03-04 ENCOUNTER — Other Ambulatory Visit: Payer: Self-pay

## 2021-03-04 ENCOUNTER — Ambulatory Visit: Payer: Commercial Managed Care - PPO | Admitting: Physical Therapy

## 2021-03-04 ENCOUNTER — Encounter: Payer: Self-pay | Admitting: Physical Therapy

## 2021-03-04 DIAGNOSIS — R293 Abnormal posture: Secondary | ICD-10-CM

## 2021-03-04 DIAGNOSIS — Z483 Aftercare following surgery for neoplasm: Secondary | ICD-10-CM

## 2021-03-04 DIAGNOSIS — R600 Localized edema: Secondary | ICD-10-CM

## 2021-03-04 NOTE — Therapy (Signed)
[facility] @ [address] [place], [place], [address] Phone: [phone]   Fax:  [phone]  Physical Therapy Treatment  Patient Details  Name: Valerie SCHLOSSBERG MRN: [id number] Date of Birth: Jun 04, 1970 Referring Provider (PT): Dr. Erroll Luna   Encounter Date: 03/04/2021   PT End of Session - 03/04/21 1315     Visit Number 12    Number of Visits 18    Date for PT Re-Evaluation 03/18/21    PT Start Time 1210    PT Stop Time 1250    PT Time Calculation (min) 40 min    Activity Tolerance Patient tolerated treatment well    Behavior During Therapy [facility] for tasks assessed/performed             Past Medical History:  Diagnosis Date   Cancer ([place])    left breast   Hashimoto's disease    Hypothyroidism    Mitral regurgitation    mild by echo 06/2020   Myelin oligodendrocyte glycoprotein antibody disorder (MOGAD) ([place])    Optic neuritis     Past Surgical History:  Procedure Laterality Date   BIOPSY BREAST  10/17/2020   no results yet   BREAST LUMPECTOMY WITH RADIOACTIVE SEED AND SENTINEL LYMPH NODE BIOPSY Left 11/15/2020   Procedure: LEFT BREAST LUMPECTOMY WITH RADIOACTIVE SEED AND LEFT SENTINEL LYMPH NODE BIOPSY;  Surgeon: Erroll Luna, MD;  Location: [facility];  Service: General;  Laterality: Left;   TUBAL LIGATION  02/2012    There were no vitals filed for this visit.   Subjective Assessment - 03/04/21 1312     Subjective Pt received her Bella Strong armsleeve and glove.  She contacted [place] to come in and check her glove as she is not able to tolerate it at all. She feels that her fingers go numb and it feels just too tight.    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 It measures 6 mm in the lower inner quadrant. it is ER/PR positive and HER2 negaitve with a Ki67 of < 5%. 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 She is treated with prednisone which impacts fluid retention.  She had left lumpectomy on 11/15/2020 with 0/3 lymph nodes removed. She says that she has a problem with passing out with medical things but can let us know if she feels this might happen.    Patient Stated Goals get rid of the arm discomfort    Currently in Pain? No/denies                    L-DEX FLOWSHEETS - 03/04/21 1300       L-DEX LYMPHEDEMA SCREENING   Measurement Type Unilateral    L-DEX MEASUREMENT EXTREMITY Upper Extremity    POSITION  Standing    DOMINANT SIDE Right    At Risk Side Left    BASELINE SCORE (UNILATERAL) 2.5    L-DEX SCORE (UNILATERAL) 2.4    VALUE CHANGE (UNILAT) -0.1                                     PT Long Term Goals - 02/14/21 0916       PT LONG TERM GOAL #2   Title Pt will report the discomfort in  her arm and axilla will have decreased by 75% so that she is able to sleep through the night    Baseline 100% improved - 02/14/21    Status Achieved                   Plan - 03/04/21 1315     Clinical Impression Statement Pt reports that she is not able to tolerate her compression glove She is wearing her sleeve but it still seems that the Bella Strong garments maybe al little too much compression overall.  SOZO was performed again today and pt has returned very close to her baseline.  Checked the measurements today and feel that she will do better with a Bells Light arm sleeve ( medium long) and gauntlet ( small) Email sent to [facility] to alert her that the patient will be calling her today to make the exchange.    Stability/Clinical Decision Making Stable/Uncomplicated    Rehab Potential Excellent    PT Frequency 2x / week    PT Duration 4 weeks    PT Treatment/Interventions ADLs/Self Care Home Management;Therapeutic exercise;Patient/family education;Manual  lymph drainage;Compression bandaging;Manual techniques;Passive range of motion;Vasopneumatic Device    PT Next Visit Plan Review self MLD to left UE, did pt get measured for another sleeve, Continue sleeve several more weeks,remeasure prn, MFR to cording check to make sure that pt gets the correct sleeve.             Patient will benefit from skilled therapeutic intervention in order to improve the following deficits and impairments:  Postural dysfunction, Decreased range of motion, Decreased knowledge of precautions, Impaired UE functional use, Pain, Increased edema  Visit Diagnosis: Aftercare following surgery for neoplasm  Abnormal posture  Localized edema     Problem List Patient Active Problem List   Diagnosis Date Noted   Malignant neoplasm of lower-inner quadrant of left breast in female, estrogen receptor positive ([place]) 10/23/2020   Optic neuritis, left 10/18/2020   Myelin oligodendrocyte glycoprotein antibody disorder (MOGAD) ([place]) 07/10/2020   Mitral regurgitation    High risk medication use 05/07/2020   Neuromyelitis optica (devic) ([place]) 05/07/2020   Hypothyroid 04/25/2020   Optic neuritis, right 04/24/2020   Donato Heinz. Owens Shark PT  Norwood Levo, PT 03/04/2021, 1:21 PM  [facility] @ [address] [place], [place], [address] Phone: [phone]   Fax:  [phone]  Name: SHELSIE TIJERINO MRN: [id number] Date of Birth: March 28, 1971

## 2021-03-04 NOTE — Patient Instructions (Signed)
Bella Light Arm Sleeve and Gauntlet

## 2021-03-12 ENCOUNTER — Encounter: Payer: Self-pay | Admitting: Physical Therapy

## 2021-03-12 ENCOUNTER — Ambulatory Visit: Payer: Commercial Managed Care - PPO | Admitting: Physical Therapy

## 2021-03-12 ENCOUNTER — Other Ambulatory Visit: Payer: Self-pay

## 2021-03-12 DIAGNOSIS — R600 Localized edema: Secondary | ICD-10-CM

## 2021-03-12 DIAGNOSIS — Z483 Aftercare following surgery for neoplasm: Secondary | ICD-10-CM | POA: Diagnosis not present

## 2021-03-12 DIAGNOSIS — C50312 Malignant neoplasm of lower-inner quadrant of left female breast: Secondary | ICD-10-CM

## 2021-03-12 DIAGNOSIS — R293 Abnormal posture: Secondary | ICD-10-CM

## 2021-03-12 NOTE — Therapy (Signed)
[facility] @ [address] [place], [place], [address] Phone: [phone]   Fax:  [phone]  Physical Therapy Treatment  Patient Details  Name: Valerie [name] MRN: [id number] Date of Birth: [birth year] Referring Provider (PT): Dr. Erroll Luna   Encounter Date: 03/12/2021   PT End of Session - 03/12/21 0851     Visit Number 13    Number of Visits 18    Date for PT Re-Evaluation 03/18/21    PT Start Time 0802    PT Stop Time 0849    PT Time Calculation (min) 47 min    Activity Tolerance Patient tolerated treatment well    Behavior During Therapy [facility] for tasks assessed/performed             Past Medical History:  Diagnosis Date   Cancer ([place])    left breast   Hashimoto's disease    Hypothyroidism    Mitral regurgitation    mild by echo 06/2020   Myelin oligodendrocyte glycoprotein antibody disorder (MOGAD) ([place])    Optic neuritis     Past Surgical History:  Procedure Laterality Date   BIOPSY BREAST  10/17/2020   no results yet   BREAST LUMPECTOMY WITH RADIOACTIVE SEED AND SENTINEL LYMPH NODE BIOPSY Left 11/15/2020   Procedure: LEFT BREAST LUMPECTOMY WITH RADIOACTIVE SEED AND LEFT SENTINEL LYMPH NODE BIOPSY;  Surgeon: Erroll Luna, MD;  Location: [facility];  Service: General;  Laterality: Left;   TUBAL LIGATION  02/2012    There were no vitals filed for this visit.   Subjective Assessment - 03/12/21 0802     Subjective Just yesterday I got an email from [place] about exchanging the sleeve. I have not gotten a return slip for the old sleeve. 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 It measures 6 mm in the lower inner quadrant. it is ER/PR positive and HER2 negaitve with a Ki67 of < 5%. 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 She is treated with prednisone which impacts fluid retention.  She had left lumpectomy on 11/15/2020 with 0/3 lymph nodes removed. She says that she has a problem with passing out with medical things but can let us know if she feels this might happen.    Patient Stated Goals get rid of the arm discomfort    Currently in Pain? No/denies    Pain Score 0-No pain                               [facility] Adult PT Treatment/Exercise - 03/12/21 0001       Manual Therapy   Myofascial Release gently to L lumpectomy scar to decrease fibrosis    Manual Lymphatic Drainage (MLD) In supine: short neck, 5 diaphragmatic breaths, right axillary nodes and establishment of interaxillary pathway, left inguinal nodes and establishment of axillo inguinal pathway, L breast moving fluid towards pathways, L UE working proximal to distal moving fluid towards pathways then retracing all steps                          PT Long Term Goals - 02/14/21 0916       PT LONG  TERM GOAL #2   Title Pt will report the discomfort in her arm and axilla will have decreased by 75% so that she is able to sleep through the night    Baseline 100% improved - 02/14/21    Status Achieved                   Plan - 03/12/21 0855     Clinical Impression Statement Pt is awaiting arrival of Bella Lite sleeve. She is currently wearing her Medi sleeve and gauntlet but it is too short. She was in touch with [facility] yesterday regarding her sleeve. Hopefully it will arrive soon. Educated pt to make an appointment in 2 weeks so she will have received her new sleeve by that time. Will assess for fit and pt may be ready for discharge at that time. Added breast MLD today since pt does have some enlarged pores and also instructed pt to do scar massage to lumpectomy scar which is fibrotic.    PT Frequency 2x / week    PT Duration 4 weeks    PT  Treatment/Interventions ADLs/Self Care Home Management;Therapeutic exercise;Patient/family education;Manual lymph drainage;Compression bandaging;Manual techniques;Passive range of motion;Vasopneumatic Device    PT Next Visit Plan did pt receive bella lite sleeve? continue MLD to LUE and L breast    PT Home Exercise Plan Post op shoulder ROM HEP, dowel exercises; wear compression sleeve and gauntlet daily for 1 month    Consulted and Agree with Plan of Care Patient             Patient will benefit from skilled therapeutic intervention in order to improve the following deficits and impairments:  Postural dysfunction, Decreased range of motion, Decreased knowledge of precautions, Impaired UE functional use, Pain, Increased edema  Visit Diagnosis: Localized edema  Aftercare following surgery for neoplasm  Abnormal posture  Malignant neoplasm of lower-inner quadrant of left breast in female, estrogen receptor positive ([place])     Problem List Patient Active Problem List   Diagnosis Date Noted   Malignant neoplasm of lower-inner quadrant of left breast in female, estrogen receptor positive ([place]) 10/23/2020   Optic neuritis, left 10/18/2020   Myelin oligodendrocyte glycoprotein antibody disorder (MOGAD) ([place]) 07/10/2020   Mitral regurgitation    High risk medication use 05/07/2020   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

## 2021-03-14 ENCOUNTER — Encounter: Payer: Self-pay | Admitting: Rehabilitation

## 2021-03-15 ENCOUNTER — Telehealth: Payer: Self-pay | Admitting: *Deleted

## 2021-03-18 ENCOUNTER — Encounter: Payer: Commercial Managed Care - PPO | Admitting: Adult Health

## 2021-03-19 ENCOUNTER — Other Ambulatory Visit: Payer: Self-pay

## 2021-03-19 ENCOUNTER — Encounter: Payer: Self-pay | Admitting: Adult Health

## 2021-03-19 ENCOUNTER — Inpatient Hospital Stay: Payer: Commercial Managed Care - PPO | Attending: Hematology and Oncology | Admitting: Adult Health

## 2021-03-19 ENCOUNTER — Ambulatory Visit
Admission: RE | Admit: 2021-03-19 | Discharge: 2021-03-19 | Disposition: A | Discharge status: Home / Self Care | Payer: Commercial Managed Care - PPO | Source: Ambulatory Visit | Attending: Neurology | Admitting: Neurology

## 2021-03-19 VITALS — BP 130/69 | HR 96 | Temp 98.6°F | Resp 18 | Ht 66.0 in | Wt 185.7 lb

## 2021-03-19 DIAGNOSIS — G3781 Myelin oligodendrocyte glycoprotein antibody disease: Secondary | ICD-10-CM

## 2021-03-19 DIAGNOSIS — G378 Other specified demyelinating diseases of central nervous system: Secondary | ICD-10-CM

## 2021-03-19 DIAGNOSIS — Z923 Personal history of irradiation: Secondary | ICD-10-CM | POA: Diagnosis not present

## 2021-03-19 DIAGNOSIS — C50312 Malignant neoplasm of lower-inner quadrant of left female breast: Secondary | ICD-10-CM | POA: Diagnosis not present

## 2021-03-19 DIAGNOSIS — Z79899 Other long term (current) drug therapy: Secondary | ICD-10-CM | POA: Insufficient documentation

## 2021-03-19 DIAGNOSIS — Z79810 Long term (current) use of selective estrogen receptor modulators (SERMs): Secondary | ICD-10-CM | POA: Diagnosis not present

## 2021-03-19 DIAGNOSIS — Z170 Estrogen receptor positive status [ER+]: Secondary | ICD-10-CM | POA: Diagnosis not present

## 2021-03-19 MED ORDER — GADOBENATE DIMEGLUMINE 529 MG/ML IV SOLN
15.0000 mL | Freq: Once | INTRAVENOUS | Status: AC | PRN
  Administered 2021-03-19: 15 mL via INTRAVENOUS

## 2021-03-19 NOTE — Progress Notes (Signed)
SURVIVORSHIP VISIT:    BRIEF ONCOLOGIC HISTORY:  Oncology History  Malignant neoplasm of lower-inner quadrant of left breast in female, estrogen receptor positive ([place])  10/23/2020 Initial Diagnosis   Screening mammogram on 09/11/20 showed possible distortion with calcifications in the left breast. Diagnostic mammogram and [place] on 10/03/20 showed 0.6 cm group of indeterminate lower inner left breast calcifications with possible associated subtle distortion and no abnormal appearing left axillary lymph nodes. Biopsy on 10/17/20 showed invasive ductal carcinoma, DCIS with calcifications, ER+(90%)/PR(95%)/Ki67(<5%).   10/24/2020 Cancer Staging   Staging form: Breast, AJCC 8th Edition - Clinical stage from 10/24/2020: Stage IA (cT1b, cN0, cM0, G1, ER+, PR+, HER2-) - Signed by Nicholas Lose, MD on 10/24/2020 Stage prefix: Initial diagnosis Histologic grading system: 3 grade system Laterality: Left Staged by: Pathologist and managing physician Stage used in treatment planning: Yes National guidelines used in treatment planning: Yes Type of national guideline used in treatment planning: NCCN    11/15/2020 Surgery   Left lumpectomy: No residual cancer, fibrocystic change, margins negative, 0/3 lymph nodes negative (based on the biopsy tumor size: 2 mm), previous ER 90%, PR 95%, HER2 negative, Ki-67 less than 5%   12/25/2020 - 01/18/2021 Radiation Therapy   Adjuvant radiation   02/21/2021 -  Anti-estrogen oral therapy   Tamoxifen     INTERVAL HISTORY:  Valerie [name] to review her survivorship care plan detailing her treatment course for breast cancer, as well as monitoring long-term side effects of that treatment, education regarding health maintenance, screening, and overall wellness and health promotion.     Overall, Valerie [name] reports feeling quite well.  She continues on tamoxifen daily.  She notes that she has some hot flashes but these are tolerable for her.  She describes these mainly as night  sweats, however she had these prior to starting tamoxifen.  She does not see a gynecologist and she says she is currently looking for new primary care and endocrinologist.  REVIEW OF SYSTEMS:  Review of Systems  Constitutional:  Negative for appetite change, chills, fatigue, fever and unexpected weight change.  HENT:   Negative for hearing loss, lump/mass and trouble swallowing.   Eyes:  Negative for eye problems and icterus.  Respiratory:  Negative for chest tightness, cough and shortness of breath.   Cardiovascular:  Negative for chest pain, leg swelling and palpitations.  Gastrointestinal:  Negative for abdominal distention, abdominal pain, constipation, diarrhea, nausea and vomiting.  Endocrine: Positive for hot flashes.  Genitourinary:  Negative for difficulty urinating.   Musculoskeletal:  Negative for arthralgias.  Skin:  Negative for itching and rash.  Neurological:  Negative for dizziness, extremity weakness, headaches and numbness.  Hematological:  Negative for adenopathy. Does not bruise/bleed easily.  Psychiatric/Behavioral:  Negative for depression. The patient is not nervous/anxious.   Breast: Denies any new nodularity, masses, tenderness, nipple changes, or nipple discharge.      ONCOLOGY TREATMENT TEAM:  1. Surgeon:  Dr. Brantley Stage at [facility] 2. Medical Oncologist: Dr. Lindi Adie  3. Radiation Oncologist: Dr. Lisbeth Renshaw    PAST MEDICAL/SURGICAL HISTORY:  Past Medical History:  Diagnosis Date   Cancer [facility])    left breast   Hashimoto's disease    Hypothyroidism    Mitral regurgitation    mild by echo 06/2020   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)    Optic neuritis    Past Surgical History:  Procedure Laterality Date   BIOPSY BREAST  10/17/2020   no results yet   BREAST LUMPECTOMY  WITH RADIOACTIVE SEED AND SENTINEL LYMPH NODE BIOPSY Left 11/15/2020   Procedure: LEFT BREAST LUMPECTOMY WITH RADIOACTIVE SEED AND LEFT SENTINEL  LYMPH NODE BIOPSY;  Surgeon: Erroll Luna, MD;  Location: [facility];  Service: General;  Laterality: Left;   TUBAL LIGATION  02/2012     ALLERGIES:  No Known Allergies   CURRENT MEDICATIONS:  Outpatient Encounter Medications as of 03/19/2021  Medication Sig   Ascorbic Acid (VITA-C PO) Take 1,000 mg by mouth daily.   cholecalciferol (VITAMIN D3) 25 MCG (1000 UNIT) tablet Take 5,000 Units by mouth daily.   levothyroxine (SYNTHROID) 100 MCG tablet Take 100 mcg by mouth daily.   predniSONE (DELTASONE) 5 MG tablet Take up to 3 pills daily as directed (Patient taking differently: 10 mg. Every other day)   Probiotic Product (CULTRELLE KIDS IMMUNE DEFENSE PO) Take 1 tablet by mouth daily.   tamoxifen (NOLVADEX) 20 MG tablet Take 1 tablet (20 mg total) by mouth daily.   zinc gluconate 50 MG tablet Take 50 mg by mouth daily.   No facility-administered encounter medications on file as of 03/19/2021.     ONCOLOGIC FAMILY HISTORY:  Family History  Problem Relation Age of Onset   GER disease Mother    Other Mother        uterine fibroids, optic disc abnormality    Eczema Mother    Colon polyps Mother    Hypothyroidism Mother    Osteoarthritis Mother    Hypertension Mother    Migraines Mother    Other Father        Colon adenoma   Hyperlipidemia Father    GER disease Father    Heart murmur Father    Amblyopia Father        Right eye   Neuropathy Father    Glaucoma Maternal Grandmother    Macular degeneration Maternal Grandmother    Multiple sclerosis Paternal Grandmother    Breast cancer Neg Hx      GENETIC COUNSELING/TESTING: Not at this time  SOCIAL HISTORY:  Social History   Socioeconomic History   Marital status: Married    Spouse name: Not on file   Number of children: 2   Years of education: BS   Highest education level: Not on file  Occupational History   Occupation: Agricultural consultant  Tobacco Use   Smoking status: Never   Smokeless tobacco:  Never  Vaping Use   Vaping Use: Never used  Substance and Sexual Activity   Alcohol use: Never   Drug use: Never   Sexual activity: Yes    Birth control/protection: None  Other Topics Concern   Not on file  Social History Narrative   Right handed    Caffeine use: rare   Social Determinants of Health   Financial Resource Strain: Low Risk    Difficulty of Paying Living Expenses: Not hard at all  Food Insecurity: No Food Insecurity   Worried About Charity fundraiser in the Last Year: Never true   [place] in the Last Year: Never true  Transportation Needs: No Transportation Needs   Lack of Transportation (Medical): No   Lack of Transportation (Non-Medical): No  Physical Activity: Inactive   Days of Exercise per Week: 0 days   Minutes of Exercise per Session: 0 min  Stress: No Stress Concern Present   Feeling of Stress : Not at all  Social Connections: Socially Integrated   Frequency of Communication with Friends and Family: More than three  times a week   Frequency of Social Gatherings with Friends and Family: Three times a week   Attends Religious Services: More than 4 times per year   Active Member of Clubs or Organizations: Yes   Attends Archivist Meetings: 1 to 4 times per year   Marital Status: Married  Human resources officer Violence: Not At Risk   Fear of Current or Ex-Partner: No   Emotionally Abused: No   Physically Abused: No   Sexually Abused: No     OBSERVATIONS/OBJECTIVE:  BP 130/69 (BP Location: Left Arm, Patient Position: Sitting)   Pulse 96   Temp 98.6 F (37 C) (Temporal)   Resp 18   Ht _0  (1.676 m)   Wt 185 lb 11.2 oz (84.2 kg)   SpO2 97%   BMI 29.97 kg/m  GENERAL: Patient is a well appearing female in no acute distress HEENT:  Sclerae anicteric.  Oropharynx clear and moist. No ulcerations or evidence of oropharyngeal candidiasis. Neck is supple.  NODES:  No cervical, supraclavicular, or axillary lymphadenopathy palpated.  BREAST  EXAM:  left breast s/p lumpectomy and radiation, no sign of local recurrence, right breast benign LUNGS:  Clear to auscultation bilaterally.  No wheezes or rhonchi. HEART:  Regular rate and rhythm. No murmur appreciated. ABDOMEN:  Soft, nontender.  Positive, normoactive bowel sounds. No organomegaly palpated. MSK:  No focal spinal tenderness to palpation. Full range of motion bilaterally in the upper extremities. EXTREMITIES:  No peripheral edema.   SKIN:  Clear with no obvious rashes or skin changes. No nail dyscrasia. NEURO:  Nonfocal. Well oriented.  Appropriate affect.   LABORATORY DATA:  None for this visit.  DIAGNOSTIC IMAGING:  None for this visit.      ASSESSMENT AND PLAN:  Ms.. [name] is a pleasant 50 y.o. female with Stage IA left breast invasive ductal carcinoma, ER+/PR+/HER2-, diagnosed in 09/2020, treated with lumpectomy, adjuvant radiation therapy, and anti-estrogen therapy with Tamoxifen beginning in 02/2021.  She presents to the Survivorship Clinic for our initial meeting and routine follow-up post-completion of treatment for breast cancer.    1. Stage IA left breast cancer:  Valerie [name] is continuing to recover from definitive treatment for breast cancer. She will follow-up with her medical oncologist, Dr. Ross Ludwig in in 6 months with history and physical exam per surveillance protocol.  She will continue her anti-estrogen therapy with Tamoxifen. Thus far, she is tolerating the Tamoxifen well, with minimal side effects. She was instructed to make Dr. Lindi Adie or myself aware if she begins to experience any worsening side effects of the medication and I could see her back in clinic to help manage those side effects, as needed. Her mammogram is due 08/2021; orders placed today. Today, a comprehensive survivorship care plan and treatment summary was reviewed with the patient today detailing her breast cancer diagnosis, treatment course, potential late/long-term effects of  treatment, appropriate follow-up care with recommendations for the future, and patient education resources.  A copy of this summary, along with a letter will be sent to the patient's primary care provider via mail/fax/In Basket message after today's visit.    2.  Care coordination: I placed referrals to internal medicine, gynecology, and endocrinology for her to establish with.  3. Bone health:   She was given education on specific activities to promote bone health.  4. Cancer screening:  Due to Valerie [name]'s history and her age, she should receive screening for skin cancers, colon cancer, and gynecologic cancers.  The information  and recommendations are listed on the patient's comprehensive care plan/treatment summary and were reviewed in detail with the patient.    5. Health maintenance and wellness promotion: Valerie [name] was encouraged to consume 5-7 servings of fruits and vegetables per day. We reviewed the "Nutrition Rainbow" handout.  She was also encouraged to engage in moderate to vigorous exercise for 30 minutes per day most days of the week. We discussed the [employer] fitness program, which is designed for cancer survivors to help them become more physically fit after cancer treatments.  She was instructed to limit her alcohol consumption and continue to abstain from tobacco use.     6. Support services/counseling: It is not uncommon for this period of the patient's cancer care trajectory to be one of many emotions and stressors.  We discussed how this can be increasingly difficult during the times of quarantine and social distancing due to the COVID-19 pandemic.   She was given information regarding our available services and encouraged to contact me with any questions or for help enrolling in any of our support group/programs.    Follow up instructions:    -Return to cancer center in 6 months for f/u with Dr. Lindi Adie  -Mammogram due in 08/2021 -Follow up with surgery in one  year -She is welcome to return back to the Survivorship Clinic at any time; no additional follow-up needed at this time.  -Consider referral back to survivorship as a long-term survivor for continued surveillance  The patient was provided an opportunity to ask questions and all were answered. The patient agreed with the plan and demonstrated an understanding of the instructions.   Total encounter time: 40 minutes in face-to-face visit time, chart review, lab review, care coordination, order entry, and documentation of the encounter.  Wilber Bihari, NP 03/19/21 4:56 PM Medical Oncology and Hematology [facility] [place], [place] [address] Tel. [phone]    Fax. [phone]  *Total Encounter Time as defined by the Centers for Medicare and Medicaid Services includes, in addition to the face-to-face time of a patient visit (documented in the note above) non-face-to-face time: obtaining and reviewing outside history, ordering and reviewing medications, tests or procedures, care coordination (communications with other health care professionals or caregivers) and documentation in the medical record.

## 2021-03-20 ENCOUNTER — Encounter: Payer: Self-pay | Admitting: Neurology

## 2021-03-20 ENCOUNTER — Other Ambulatory Visit: Payer: Self-pay | Admitting: Neurology

## 2021-03-26 ENCOUNTER — Encounter: Payer: Self-pay | Admitting: Adult Health

## 2021-03-27 ENCOUNTER — Other Ambulatory Visit: Payer: Self-pay

## 2021-03-27 ENCOUNTER — Encounter: Payer: Self-pay | Admitting: Rehabilitation

## 2021-03-27 ENCOUNTER — Ambulatory Visit: Payer: Commercial Managed Care - PPO | Attending: Surgery | Admitting: Rehabilitation

## 2021-03-27 DIAGNOSIS — Z170 Estrogen receptor positive status [ER+]: Secondary | ICD-10-CM | POA: Diagnosis present

## 2021-03-27 DIAGNOSIS — C50312 Malignant neoplasm of lower-inner quadrant of left female breast: Secondary | ICD-10-CM | POA: Insufficient documentation

## 2021-03-27 DIAGNOSIS — Z483 Aftercare following surgery for neoplasm: Secondary | ICD-10-CM | POA: Insufficient documentation

## 2021-03-27 NOTE — Therapy (Signed)
[facility] @ [address] [place], [place], [address] Phone: [phone]   Fax:  [phone]  Physical Therapy Treatment  Patient Details  Name: Valerie [name] MRN: [id number] Date of Birth: 10-09-1970 Referring Provider (PT): Dr. Erroll Luna   Encounter Date: 03/27/2021   PT End of Session - 03/27/21 0843     Visit Number 14    PT Start Time 0803    PT Stop Time 0833    PT Time Calculation (min) 30 min    Activity Tolerance Patient tolerated treatment well    Behavior During Therapy [facility] for tasks assessed/performed             Past Medical History:  Diagnosis Date   Cancer ([place])    left breast   Hashimoto's disease    Hypothyroidism    Mitral regurgitation    mild by echo 06/2020   Myelin oligodendrocyte glycoprotein antibody disorder (MOGAD) ([place])    Optic neuritis     Past Surgical History:  Procedure Laterality Date   BIOPSY BREAST  10/17/2020   no results yet   BREAST LUMPECTOMY WITH RADIOACTIVE SEED AND SENTINEL LYMPH NODE BIOPSY Left 11/15/2020   Procedure: LEFT BREAST LUMPECTOMY WITH RADIOACTIVE SEED AND LEFT SENTINEL LYMPH NODE BIOPSY;  Surgeon: Erroll Luna, MD;  Location: [facility];  Service: General;  Laterality: Left;   TUBAL LIGATION  02/2012    There were no vitals filed for this visit.   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 It measures 6 mm in the lower inner quadrant. it is ER/PR positive and HER2 negaitve with a Ki67 of < 5%. 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 She is treated with prednisone which impacts fluid retention.  She had left lumpectomy on 11/15/2020 with 0/3 lymph nodes removed. She says that she has a  problem with passing out with medical things but can let us know if she feels this might happen.    Currently in Pain? No/denies                               [facility] Adult PT Treatment/Exercise - 03/27/21 0001       Manual Therapy   Manual therapy comments checked new bella lite long which is a bit too long and the gauntlet seems to arleady be falling apart.  Remeasured SOZO which is still in the green zone so pt will return and move back to the medi comfort sleeve and medi harmony gauntlet and this was emailed to [employer] at [place].                          PT Long Term Goals - 03/27/21 0845       PT LONG TERM GOAL #1   Title Patient will demonstrate she has regained full shoulder ROM and function post operatively compared to baselines.    Status Achieved      PT LONG TERM GOAL #2   Title Pt will report the discomfort in her arm and axilla will have decreased by 75% so that she is able to sleep through the night  Status Achieved      PT LONG TERM GOAL #3   Title Pt will be independent in a home exercise for shoulder ROM    Status Achieved                   Plan - 03/27/21 0843     Clinical Impression Statement Rechecked SOZO which is still green and initiated return of garments per treatment section.  Pt will hopefully not need any more treatments except for continued every 3 month SOZO checks.    PT Frequency 2x / week    PT Duration 4 weeks    PT Next Visit Plan SOZO    Consulted and Agree with Plan of Care Patient             Patient will benefit from skilled therapeutic intervention in order to improve the following deficits and impairments:     Visit Diagnosis: Aftercare following surgery for neoplasm  Malignant neoplasm of lower-inner quadrant of left breast in female, estrogen receptor positive [facility])     Problem List Patient Active Problem List   Diagnosis Date Noted   Malignant neoplasm of lower-inner  quadrant of left breast in female, estrogen receptor positive ([place]) 10/23/2020   Optic neuritis, left 10/18/2020   Myelin oligodendrocyte glycoprotein antibody disorder (MOGAD) ([place]) 07/10/2020   Mitral regurgitation    High risk medication use 05/07/2020   Neuromyelitis optica (devic) ([place]) 05/07/2020   Hypothyroid 04/25/2020   Optic neuritis, right 04/24/2020    Stark Bray, PT 03/27/2021, 8:45 AM  [facility] @ [address] [place], [place], [address] Phone: [phone]   Fax:  [phone]  Name: ESPERANSA SARABIA MRN: [id number] Date of Birth: Mar 23, 1971

## 2021-04-02 ENCOUNTER — Ambulatory Visit: Payer: Commercial Managed Care - PPO | Admitting: Neurology

## 2021-04-11 ENCOUNTER — Encounter: Payer: Self-pay | Admitting: Adult Health

## 2021-04-11 ENCOUNTER — Encounter: Payer: Self-pay | Admitting: Neurology

## 2021-04-11 ENCOUNTER — Ambulatory Visit (INDEPENDENT_AMBULATORY_CARE_PROVIDER_SITE_OTHER): Payer: Commercial Managed Care - PPO | Admitting: Neurology

## 2021-04-11 VITALS — BP 98/74 | HR 92 | Ht 66.0 in | Wt 175.5 lb

## 2021-04-11 DIAGNOSIS — Z79899 Other long term (current) drug therapy: Secondary | ICD-10-CM

## 2021-04-11 DIAGNOSIS — H469 Unspecified optic neuritis: Secondary | ICD-10-CM | POA: Diagnosis not present

## 2021-04-11 DIAGNOSIS — C50312 Malignant neoplasm of lower-inner quadrant of left female breast: Secondary | ICD-10-CM | POA: Diagnosis not present

## 2021-04-11 DIAGNOSIS — G378 Other specified demyelinating diseases of central nervous system: Secondary | ICD-10-CM

## 2021-04-11 DIAGNOSIS — Z170 Estrogen receptor positive status [ER+]: Secondary | ICD-10-CM

## 2021-04-11 NOTE — Progress Notes (Signed)
[facility]  PATIENT: Valerie [name] DOB: 06/10/1970  REFERRING DOCTOR OR PCP:  Dr. Curly Rim SOURCE: Patient, notes from recent hospitalization, laboratory results, MRI images personally reviewed.  _________________________________   HISTORICAL  CHIEF COMPLAINT:  Chief Complaint  Patient presents with   Follow-up    Rm 2, alone. Here for 5 month f/u. Pt reports lowering her prednisone down to 7.5mg , 3 weeks ago and doing well.     HISTORY OF PRESENT ILLNESS:  Valerie [name], is a 50 y.o. woman with MOGAD and  optic neuritis   Update 12/29//2022: She is on prednisone 7.5 mg qod (x 3-4 weeks) and is  Rituxan (second series 01/2021).  She had the first Rituxan in April and next one will be in October.     She had trouble tolerating azathioprine.  She had some worsening in vision a few weeks ago and was temporarily on 20 mg prednisone.      She had two definite episodes of ON, the right December 2021 and the left 05/05/2020.     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   Colors are desaturated OS.    She has not had any spinal cord syndrome.  Sometimes she drops items but denies any significant weakness She denies any change in her gait or balance now but had some issues in January.   She notes no difficulty with strength or sensation.  Bladder function is fine.  She notes occasionally brain fog.   She sleeps well.     She had Covid 11/2019.   She did not do the vaccination.    She had calcifications on the left breast and had a biopsy.   She was diagnosed with left breast cancer summer 2022 - she did a biopsy, followed by surgery and radiation.[facility]  Lymph nodes were clean at surgery.   Tamoxifen was started (will take x 5 years).       HISTORY MOGAD / ON She  had right >> left eye pain at the end of December 2021.  On 04/20/2020 she began tp have reduced vision OD and photophobia.    She saw an  ophthalmologist who felt she had dry eyes.   She saw Dr. Glenis Smoker the next day who diagnosed her with right optic neuritis.  She was referred to Dr. Baird Cancer who did OCT c/with optic neuritis.   She was admittted 04/24/2020 to 04/29/2020 and received 5 days IV Solu-Medrol.   Vision improved with the steroids.   Upon discharge, she still had reduced visual acuity but good color vision.   She had only mild eye aching and mild photophobia.     She continued to be stable for the next few days.   On 05/05/2020, 2 days ago, she had aching in the left eye.      Imaging studies: MRI of the orbits, and brain 04/24/2020 showed evidence of right optic neuritis.  The brain was otherwise normal.  MRI of the cervical and thoracic spine 04/24/2020 showed no demyelination.  She does have some mild degenerative changes in the cervical spine.  Data review: Imaging: MRI of the brain 04/24/2020 was normal.  MRI of the orbits 04/24/2020 showed abnormal signal with enhancement in the right optic nerve consistent with optic neuritis.  MRI of the cervical spine and MRI of the thoracic spine 04/24/2020 showed normal spinal cord and some degenerative changes with moderate right foraminal  narrowing at C5-C6.  Laboratory data: Labs 04/25/2020:   Anti-MOG antibodies were high titer positive (1: 320).  HIV was negative.   ANA/Sjogrn's antibodies were negative.   RPR was negative,  Anti-NMO Ab was negative,   HgbA1c was normal 5.4.  Sugars ranged 110-183.      REVIEW OF SYSTEMS: Constitutional: No fevers, chills, sweats, or change in appetite Eyes: No visual changes, double vision, eye pain Ear, nose and throat: No hearing loss, ear pain, nasal congestion, sore throat Cardiovascular: No chest pain, palpitations Respiratory:  No shortness of breath at rest or with exertion.   No wheezes GastrointestinaI: No nausea, vomiting, diarrhea, abdominal pain, fecal incontinence Genitourinary:  No dysuria, urinary retention or frequency.   No nocturia. Musculoskeletal:  No neck pain, back pain Integumentary: No rash, pruritus, skin lesions Neurological: as above Psychiatric: No depression at this time.  No anxiety Endocrine: No palpitations, diaphoresis, change in appetite, change in weigh or increased thirst Hematologic/Lymphatic:  No anemia, purpura, petechiae. Allergic/Immunologic: No itchy/runny eyes, nasal congestion, recent allergic reactions, rashes  ALLERGIES: No Known Allergies  HOME MEDICATIONS:  Current Outpatient Medications:    Ascorbic Acid (VITA-C PO), Take 1,000 mg by mouth daily., Disp: , Rfl:    ascorbic acid (VITAMIN C) 1000 MG tablet, Take 1 tablet by mouth daily., Disp: , Rfl:    cholecalciferol (VITAMIN D3) 25 MCG (1000 UNIT) tablet, Take 5,000 Units by mouth daily., Disp: , Rfl:    levothyroxine (SYNTHROID) 100 MCG tablet, Take 100 mcg by mouth daily., Disp: , Rfl:    predniSONE (DELTASONE) 5 MG tablet, Take up to 3 pills daily as directed (Patient taking differently: 10 mg. Every other day), Disp: 100 tablet, Rfl: 3   Probiotic Product (CULTRELLE KIDS IMMUNE DEFENSE PO), Take 1 tablet by mouth daily., Disp: , Rfl:    tamoxifen (NOLVADEX) 20 MG tablet, Take 1 tablet (20 mg total) by mouth daily., Disp: 90 tablet, Rfl: 3   zinc gluconate 50 MG tablet, Take 50 mg by mouth daily., Disp: , Rfl:   PAST MEDICAL HISTORY: Past Medical History:  Diagnosis Date   Cancer ([place])    left breast   Hashimoto's disease    Hypothyroidism    Mitral regurgitation    mild by echo 06/2020   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)    Optic neuritis     PAST SURGICAL HISTORY: Past Surgical History:  Procedure Laterality Date   BIOPSY BREAST  10/17/2020   no results yet   BREAST LUMPECTOMY WITH RADIOACTIVE SEED AND SENTINEL LYMPH NODE BIOPSY Left 11/15/2020   Procedure: LEFT BREAST LUMPECTOMY WITH RADIOACTIVE SEED AND LEFT SENTINEL LYMPH NODE BIOPSY;  Surgeon: Erroll Luna, MD;   Location: [facility];  Service: General;  Laterality: Left;   TUBAL LIGATION  02/2012    FAMILY HISTORY: Family History  Problem Relation Age of Onset   GER disease Mother    Other Mother        uterine fibroids, optic disc abnormality    Eczema Mother    Colon polyps Mother    Hypothyroidism Mother    Osteoarthritis Mother    Hypertension Mother    Migraines Mother    Other Father        Colon adenoma   Hyperlipidemia Father    GER disease Father    Heart murmur Father    Amblyopia Father        Right eye   Neuropathy Father    Glaucoma  Maternal Grandmother    Macular degeneration Maternal Grandmother    Multiple sclerosis Paternal Grandmother    Breast cancer Neg Hx     SOCIAL HISTORY:  Social History   Socioeconomic History   Marital status: Married    Spouse name: Not on file   Number of children: 2   Years of education: BS   Highest education level: Not on file  Occupational History   Occupation: Agricultural consultant  Tobacco Use   Smoking status: Never   Smokeless tobacco: Never  Vaping Use   Vaping Use: Never used  Substance and Sexual Activity   Alcohol use: Never   Drug use: Never   Sexual activity: Yes    Birth control/protection: None  Other Topics Concern   Not on file  Social History Narrative   Right handed    Caffeine use: rare   Social Determinants of Health   Financial Resource Strain: Low Risk    Difficulty of Paying Living Expenses: Not hard at all  Food Insecurity: No Food Insecurity   Worried About Charity fundraiser in the Last Year: Never true   [place] in the Last Year: Never true  Transportation Needs: No Transportation Needs   Lack of Transportation (Medical): No   Lack of Transportation (Non-Medical): No  Physical Activity: Inactive   Days of Exercise per Week: 0 days   Minutes of Exercise per Session: 0 min  Stress: No Stress Concern Present   Feeling of Stress : Not at all  Social Connections:  Socially Integrated   Frequency of Communication with Friends and Family: More than three times a week   Frequency of Social Gatherings with Friends and Family: Three times a week   Attends Religious Services: More than 4 times per year   Active Member of Clubs or Organizations: Yes   Attends Archivist Meetings: 1 to 4 times per year   Marital Status: Married  Human resources officer Violence: Not At Risk   Fear of Current or Ex-Partner: No   Emotionally Abused: No   Physically Abused: No   Sexually Abused: No     PHYSICAL EXAM  Vitals:   04/11/21 1450  BP: 98/74  Pulse: 92  SpO2: 95%  Weight: 175 lb 8 oz (79.6 kg)  Height: 5\' 6"  (1.676 m)    Body mass index is 28.33 kg/m.  No results found.    General: The patient is well-developed and well-nourished and in no acute distress  HEENT:  Head is [place]/AT.  Sclera are anicteric.    Skin: Extremities are without rash or  edema.   Neurologic Exam  Mental status: The patient is alert and oriented x 3 at the time of the examination. The patient has apparent normal recent and remote memory, with an apparently normal attention span and concentration ability.   Speech is normal.  Cranial nerves: Extraocular movements are full.  Moving the eyes laterally causes pain in the left eye.  She has a 1+ left APD.  Color vision is desaturated slightly OD.  Facial strength and sensation was normal.. No obvious hearing deficits are noted.  Motor:  Muscle bulk is normal.   Tone is normal. Strength is  5 / 5 in all 4 extremities.   Sensory: Sensory testing is intact to pinprick, soft touch and vibration sensation in all 4 extremities.  Coordination: Cerebellar testing reveals good finger-nose-finger and heel-to-shin bilaterally.  Gait and station: Station is normal.   Gait is normal  Tandem gait is mildly wide. Romberg is negative.   Reflexes: Deep tendon reflexes are symmetric and normal bilaterally.     DIAGNOSTIC DATA (LABS,  IMAGING, TESTING) - I reviewed patient records, labs, notes, testing and imaging myself where available.  Lab Results  Component Value Date   WBC 6.8 10/24/2020   HGB 14.6 10/24/2020   HCT 42.6 10/24/2020   MCV 94.9 10/24/2020   PLT 256 10/24/2020      Component Value Date/Time   NA 143 10/24/2020 0840   NA 137 07/05/2020 1154   K 4.6 10/24/2020 0840   CL 109 10/24/2020 0840   CO2 25 10/24/2020 0840   GLUCOSE 87 10/24/2020 0840   BUN 23 (H) 10/24/2020 0840   BUN 16 07/05/2020 1154   CREATININE 0.97 10/24/2020 0840   CALCIUM 9.0 10/24/2020 0840   PROT 6.8 10/24/2020 0840   ALBUMIN 3.4 (L) 10/24/2020 0840   AST 17 10/24/2020 0840   ALT 16 10/24/2020 0840   ALKPHOS 41 10/24/2020 0840   BILITOT 0.3 10/24/2020 0840   GFRNONAA >60 10/24/2020 0840   No results found for: CHOL, HDL, LDLCALC, LDLDIRECT, TRIG, CHOLHDL Lab Results  Component Value Date   HGBA1C 5.4 04/25/2020       ASSESSMENT AND PLAN  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)  High risk medication use  Optic neuritis, right  Optic neuritis, left  Malignant neoplasm of lower-inner quadrant of left breast in female, estrogen receptor positive ([place])   1.   We will taper the steroids off over an additional month --- 5 mg every other day x2 weeks then 2.5 mg every other day x2 weeks then stop.   2.   Continue Rituxan   we will check CBC with differential and IgG/IgM at the next visit 3.   rtc 6 months    Garnell Begeman A. Felecia Shelling, MD, [facility] 29/92/4268, 3:41 PM Certified in Neurology, Clinical Neurophysiology, Sleep Medicine and Neuroimaging  [facility] [address] [place], [place] [address] [phone]

## 2021-04-12 ENCOUNTER — Telehealth: Payer: Self-pay

## 2021-04-12 NOTE — Telephone Encounter (Signed)
Called pt, left VM re mychart message.  Referrals placed and advised pt to call and follow up with [place] if she has not heard from [facility] next week

## 2021-04-19 ENCOUNTER — Encounter: Payer: Self-pay | Admitting: Rehabilitation

## 2021-04-19 ENCOUNTER — Encounter: Payer: Self-pay | Admitting: Neurology

## 2021-04-22 ENCOUNTER — Encounter: Payer: Self-pay | Admitting: Hematology and Oncology

## 2021-04-23 ENCOUNTER — Telehealth: Payer: Self-pay | Admitting: Hematology and Oncology

## 2021-04-23 NOTE — Telephone Encounter (Signed)
Sch per 9 inb, pt aware

## 2021-05-01 ENCOUNTER — Encounter: Payer: Self-pay | Admitting: Neurology

## 2021-05-04 NOTE — Progress Notes (Signed)
HEMATOLOGY-ONCOLOGY MYCHART VIDEO VISIT PROGRESS NOTE  I connected with Valerie [name] on 05/06/2021 at 10:45 AM EST by MyChart video conference and verified that I am speaking with the correct person using two identifiers.  I discussed the limitations, risks, security and privacy concerns of performing an evaluation and management service by MyChart and the availability of in person appointments.  I also discussed with the patient that there may be a patient responsible charge related to this service. The patient expressed understanding and agreed to proceed.  Patient's Location: Home Physician Location: Clinic  CHIEF COMPLIANT: Follow-up of left breast cancer  INTERVAL HISTORY: Valerie [name] is a 51 y.o. female with above-mentioned history of left breast cancer and radiation therapy, currently on tamoxifen. She presents to via [facility] today for follow-up.  She has been on tamoxifen since November and developed elbow pain bilaterally.  She stopped tamoxifen but did not make a difference.  She is going to go back to 10 mg tamoxifen daily.  Oncology History  Malignant neoplasm of lower-inner quadrant of left breast in female, estrogen receptor positive ([place])  10/23/2020 Initial Diagnosis   Screening mammogram on 09/11/20 showed possible distortion with calcifications in the left breast. Diagnostic mammogram and [place] on 10/03/20 showed 0.6 cm group of indeterminate lower inner left breast calcifications with possible associated subtle distortion and no abnormal appearing left axillary lymph nodes. Biopsy on 10/17/20 showed invasive ductal carcinoma, DCIS with calcifications, ER+(90%)/PR(95%)/Ki67(<5%).   10/24/2020 Cancer Staging   Staging form: Breast, AJCC 8th Edition - Clinical stage from 10/24/2020: Stage IA (cT1b, cN0, cM0, G1, ER+, PR+, HER2-) - Signed by Nicholas Lose, MD on 10/24/2020 Stage prefix: Initial diagnosis Histologic grading system: 3 grade system Laterality: Left Staged by:  Pathologist and managing physician Stage used in treatment planning: Yes National guidelines used in treatment planning: Yes Type of national guideline used in treatment planning: NCCN    11/15/2020 Surgery   Left lumpectomy: No residual cancer, fibrocystic change, margins negative, 0/3 lymph nodes negative (based on the biopsy tumor size: 2 mm), previous ER 90%, PR 95%, HER2 negative, Ki-67 less than 5%   12/25/2020 - 01/18/2021 Radiation Therapy   Adjuvant radiation   02/21/2021 -  Anti-estrogen oral therapy   Tamoxifen     Observations/Objective:  There were no vitals filed for this visit. There is no height or weight on file to calculate BMI.  I have reviewed the data as listed CMP Latest Ref Rng & Units 10/24/2020 07/05/2020 05/06/2020  Glucose 70 - 99 mg/dL 87 103(H) 97  BUN 6 - 20 mg/dL 23(H) 16 13  Creatinine 0.44 - 1.00 mg/dL 0.97 0.92 1.05(H)  Sodium 135 - 145 mmol/L 143 137 138  Potassium 3.5 - 5.1 mmol/L 4.6 4.8 4.5  Chloride 98 - 111 mmol/L 109 98 105  CO2 22 - 32 mmol/L 25 25 24   Calcium 8.9 - 10.3 mg/dL 9.0 9.2 8.6(L)  Total Protein 6.5 - 8.1 g/dL 6.8 - 6.7  Total Bilirubin 0.3 - 1.2 mg/dL 0.3 - 0.4  Alkaline Phos 38 - 126 U/L 41 - 41  AST 15 - 41 U/L 17 - 15  ALT 0 - 44 U/L 16 - 27    Lab Results  Component Value Date   WBC 6.8 10/24/2020   HGB 14.6 10/24/2020   HCT 42.6 10/24/2020   MCV 94.9 10/24/2020   PLT 256 10/24/2020   NEUTROABS 4.8 10/24/2020      Assessment Plan:  Malignant neoplasm of lower-inner quadrant  of left breast in female, estrogen receptor positive ([place]) 10/23/2020 screening mammogram on 09/11/20 showed possible distortion with calcifications in the left breast. Diagnostic mammogram and [place] on 10/03/20 showed 0.6 cm group of indeterminate lower inner left breast calcifications with possible associated subtle distortion and no abnormal appearing left axillary lymph nodes. Biopsy on 10/17/20 showed invasive ductal carcinoma, DCIS with  calcifications, ER+(90%)/PR(95%)/Ki67(<5%).   11/15/2020:Left lumpectomy: No residual cancer, fibrocystic change, margins negative, 0/3 lymph nodes negative (based on the biopsy tumor size: 2 mm), previous ER 90%, PR 95%, HER2 negative, Ki-67 less than 5%   Adjuvant radiation: 12/25/2020-01/18/2021   Treatment plan: Adjuvant antiestrogen therapy with tamoxifen 10 mg daily x5 to 10 years Tamoxifen toxicities: Elbow pain: Not responding to stopping Tam, oral steroids havent helped her.Advised her to use CBD oil and Voltaren gel Hot flushes  Breast cancer surveillance: mammogram to be done in May 2023  Return to clinic in 1 year for follow-up    I discussed the assessment and treatment plan with the patient. The patient was provided an opportunity to ask questions and all were answered. The patient agreed with the plan and demonstrated an understanding of the instructions. The patient was advised to call back or seek an in-person evaluation if the symptoms worsen or if the condition fails to improve as anticipated.   Total time spent: 20 minutes including face-to-face MyChart video visit time and time spent for planning, charting and coordination of care  Rulon Eisenmenger, MD 05/06/2021  I, Thana Ates am acting as scribe for Nicholas Lose, MD.  I have reviewed the above documentation for accuracy and completeness, and I agree with the above.

## 2021-05-06 ENCOUNTER — Inpatient Hospital Stay: Payer: Commercial Managed Care - PPO | Attending: Hematology and Oncology | Admitting: Hematology and Oncology

## 2021-05-06 DIAGNOSIS — C50312 Malignant neoplasm of lower-inner quadrant of left female breast: Secondary | ICD-10-CM

## 2021-05-06 DIAGNOSIS — M25522 Pain in left elbow: Secondary | ICD-10-CM | POA: Diagnosis not present

## 2021-05-06 DIAGNOSIS — Z170 Estrogen receptor positive status [ER+]: Secondary | ICD-10-CM | POA: Diagnosis not present

## 2021-05-06 DIAGNOSIS — M25521 Pain in right elbow: Secondary | ICD-10-CM | POA: Insufficient documentation

## 2021-05-06 DIAGNOSIS — Z79899 Other long term (current) drug therapy: Secondary | ICD-10-CM | POA: Insufficient documentation

## 2021-05-06 DIAGNOSIS — Z79810 Long term (current) use of selective estrogen receptor modulators (SERMs): Secondary | ICD-10-CM | POA: Diagnosis not present

## 2021-05-06 NOTE — Assessment & Plan Note (Signed)
10/23/2020 screening mammogram on 09/11/20 showed possible distortion with calcifications in the left breast. Diagnostic mammogram and [place] on 10/03/20 showed 0.6 cm group of indeterminate lower inner left breast calcifications with possible associated subtle distortion and no abnormal appearing left axillary lymph nodes. Biopsy on 10/17/20 showed invasive ductal carcinoma, DCIS with calcifications, ER+(90%)/PR(95%)/Ki67(<5%).  11/15/2020:Left lumpectomy: No residual cancer, fibrocystic change, margins negative, 0/3 lymph nodes negative (based on the biopsy tumor size: 2 mm), previous ER 90%, PR 95%, HER2 negative, Ki-67 less than 5%  Adjuvant radiation: 12/25/2020-01/18/2021  Treatment plan: Adjuvant antiestrogen therapy with tamoxifen 20 mg daily x5 to 10 years Tamoxifen toxicities:  Breast cancer surveillance: 1.  Breast exam 05/06/2021: Benign 2. mammogram to be done in July 2023  Return to clinic in 1 year for follow-up

## 2021-05-29 ENCOUNTER — Encounter: Payer: Self-pay | Admitting: Neurology

## 2021-06-04 ENCOUNTER — Ambulatory Visit: Payer: Commercial Managed Care - PPO | Admitting: Neurology

## 2021-06-04 ENCOUNTER — Encounter: Payer: Self-pay | Admitting: Neurology

## 2021-06-04 VITALS — BP 110/81 | HR 84 | Ht 66.0 in | Wt 186.5 lb

## 2021-06-04 DIAGNOSIS — H469 Unspecified optic neuritis: Secondary | ICD-10-CM | POA: Diagnosis not present

## 2021-06-04 DIAGNOSIS — G378 Other specified demyelinating diseases of central nervous system: Secondary | ICD-10-CM | POA: Diagnosis not present

## 2021-06-04 DIAGNOSIS — Z79899 Other long term (current) drug therapy: Secondary | ICD-10-CM | POA: Diagnosis not present

## 2021-06-04 DIAGNOSIS — G3781 Myelin oligodendrocyte glycoprotein antibody disease: Secondary | ICD-10-CM

## 2021-06-04 NOTE — Progress Notes (Signed)
[facility]  PATIENT: Valerie [name] DOB: 27-Nov-1970  REFERRING DOCTOR OR PCP:  Dr. Curly Rim SOURCE: Patient, notes from recent hospitalization, laboratory results, MRI images personally reviewed.  _________________________________   HISTORICAL  CHIEF COMPLAINT:  Chief Complaint  Patient presents with   Follow-up    Rm 1, alone. Pt reports doing well today. On 7.5mg  of prednisone.     HISTORY OF PRESENT ILLNESS:  Valerie [name], is a 51 y.o. woman with MOGAD and  optic neuritis   Update 06/04/2021 She had another visual optic neuritis exacerbation several weeks ago.  We temporarily increase her prednisone dose.  She feels her vision is back to that baseline.  She is on prednisone 7.5 mg qod but took more when vision   Her last  Rituxan (second series ) was 01/2021.  She had the first Rituxan in April.  She had trouble tolerating azathioprine.  She had some worsening in vision a few weeks ago and was temporarily on 20 mg prednisone.      She had two definite episodes of ON, the right December 2021 and the left 05/05/2020.     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   Colors are desaturated OS.    She has not had any spinal cord syndrome.  Sometimes she drops items but denies any significant weakness  She denies any change in her gait or balance now but had some issues in January.   She notes no difficulty with strength or sensation.  Bladder function is fine.  She notes occasionally brain fog.   She sleeps well.     She had calcifications on the left breast and had a biopsy.   She was diagnosed with left breast cancer summer 2022 - she did a biopsy, followed by surgery and radiation.Marland Kitchen  Lymph nodes were clean at surgery.   She is on Tamoxifen  (will take x 5 years).      She had Covid 11/2019.   She did not do the vaccination.     HISTORY MOGAD / ON She  had right >> left eye pain at the end of  December 2021.  On 04/20/2020 she began tp have reduced vision OD and photophobia.    She saw an ophthalmologist who felt she had dry eyes.   She saw Dr. Glenis Smoker the next day who diagnosed her with right optic neuritis.  She was referred to Dr. Baird Cancer who did OCT c/with optic neuritis.   She was admittted 04/24/2020 to 04/29/2020 and received 5 days IV Solu-Medrol.   Vision improved with the steroids.   Upon discharge, she still had reduced visual acuity but good color vision.   She had only mild eye aching and mild photophobia.     She continued to be stable for the next few days.   On 05/05/2020, 2 days ago, she had aching in the left eye.      Imaging studies: MRI of the orbits, and brain 04/24/2020 showed evidence of right optic neuritis.  The brain was otherwise normal.  MRI of the cervical and thoracic spine 04/24/2020 showed no demyelination.  She does have some mild degenerative changes in the cervical spine.  Data review: Imaging: MRI of the brain 04/24/2020 was normal.  MRI of the orbits 04/24/2020 showed abnormal signal with enhancement in the right optic nerve consistent with optic neuritis.  MRI of the cervical spine and MRI  of the thoracic spine 04/24/2020 showed normal spinal cord and some degenerative changes with moderate right foraminal narrowing at C5-C6.  Laboratory data: Labs 04/25/2020:   Anti-MOG antibodies were high titer positive (1: 320).  HIV was negative.   ANA/Sjogrn's antibodies were negative.   RPR was negative,  Anti-NMO Ab was negative,   HgbA1c was normal 5.4.  Sugars ranged 110-183.      REVIEW OF SYSTEMS: Constitutional: No fevers, chills, sweats, or change in appetite Eyes: No visual changes, double vision, eye pain Ear, nose and throat: No hearing loss, ear pain, nasal congestion, sore throat Cardiovascular: No chest pain, palpitations Respiratory:  No shortness of breath at rest or with exertion.   No wheezes GastrointestinaI: No nausea, vomiting, diarrhea,  abdominal pain, fecal incontinence Genitourinary:  No dysuria, urinary retention or frequency.  No nocturia. Musculoskeletal:  No neck pain, back pain Integumentary: No rash, pruritus, skin lesions Neurological: as above Psychiatric: No depression at this time.  No anxiety Endocrine: No palpitations, diaphoresis, change in appetite, change in weigh or increased thirst Hematologic/Lymphatic:  No anemia, purpura, petechiae. Allergic/Immunologic: No itchy/runny eyes, nasal congestion, recent allergic reactions, rashes  ALLERGIES: No Known Allergies  HOME MEDICATIONS:  Current Outpatient Medications:    Ascorbic Acid (VITA-C PO), Take 1,000 mg by mouth daily., Disp: , Rfl:    ascorbic acid (VITAMIN C) 1000 MG tablet, Take 1 tablet by mouth daily., Disp: , Rfl:    cholecalciferol (VITAMIN D3) 25 MCG (1000 UNIT) tablet, Take 5,000 Units by mouth daily., Disp: , Rfl:    levothyroxine (SYNTHROID) 100 MCG tablet, Take 100 mcg by mouth daily., Disp: , Rfl:    predniSONE (DELTASONE) 5 MG tablet, Take up to 3 pills daily as directed (Patient taking differently: 10 mg. 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)    Optic neuritis     PAST SURGICAL HISTORY: Past Surgical History:  Procedure Laterality Date   BIOPSY BREAST  10/17/2020   no results yet   BREAST LUMPECTOMY WITH RADIOACTIVE SEED AND SENTINEL LYMPH NODE BIOPSY Left 11/15/2020   Procedure: LEFT BREAST LUMPECTOMY WITH RADIOACTIVE SEED  AND LEFT SENTINEL LYMPH NODE BIOPSY;  Surgeon: Erroll Luna, MD;  Location: [facility];  Service: General;  Laterality: Left;   TUBAL LIGATION  02/2012    FAMILY HISTORY: Family History  Problem Relation Age of Onset   GER disease Mother    Other Mother        uterine fibroids, optic disc abnormality    Eczema Mother    Colon polyps Mother    Hypothyroidism Mother    Osteoarthritis Mother    Hypertension Mother    Migraines Mother    Other Father        Colon adenoma   Hyperlipidemia Father    GER disease Father    Heart murmur Father    Amblyopia Father  Right eye   Neuropathy Father    Glaucoma Maternal Grandmother    Macular degeneration Maternal Grandmother    Multiple sclerosis Paternal Grandmother    Breast cancer Neg Hx     SOCIAL HISTORY:  Social History   Socioeconomic History   Marital status: Married    Spouse name: Not on file   Number of children: 2   Years of education: BS   Highest education level: Not on file  Occupational History   Occupation: Agricultural consultant  Tobacco Use   Smoking status: Never   Smokeless tobacco: Never  Vaping Use   Vaping Use: Never used  Substance and Sexual Activity   Alcohol use: Never   Drug use: Never   Sexual activity: Yes    Birth control/protection: None  Other Topics Concern   Not on file  Social History Narrative   Right handed    Caffeine use: rare   Social Determinants of Health   Financial Resource Strain: Low Risk    Difficulty of Paying Living Expenses: Not hard at all  Food Insecurity: No Food Insecurity   Worried About Charity fundraiser in the Last Year: Never true   [place] in the Last Year: Never true  Transportation Needs: No Transportation Needs   Lack of Transportation (Medical): No   Lack of Transportation (Non-Medical): No  Physical Activity: Inactive   Days of Exercise per Week: 0 days   Minutes of Exercise per Session: 0 min  Stress: No Stress  Concern Present   Feeling of Stress : Not at all  Social Connections: Socially Integrated   Frequency of Communication with Friends and Family: More than three times a week   Frequency of Social Gatherings with Friends and Family: Three times a week   Attends Religious Services: More than 4 times per year   Active Member of Clubs or Organizations: Yes   Attends Archivist Meetings: 1 to 4 times per year   Marital Status: Married  Human resources officer Violence: Not At Risk   Fear of Current or Ex-Partner: No   Emotionally Abused: No   Physically Abused: No   Sexually Abused: No     PHYSICAL EXAM  Vitals:   06/04/21 1550  BP: 110/81  Pulse: 84  Weight: 186 lb 8 oz (84.6 kg)  Height: 5\' 6"  (1.676 m)    Body mass index is 30.1 kg/m.  No results found.    General: The patient is well-developed and well-nourished and in no acute distress  HEENT:  Head is [place]/AT.  Sclera are anicteric.    Skin: Extremities are without rash or  edema.   Neurologic Exam  Mental status: The patient is alert and oriented x 3 at the time of the examination. The patient has apparent normal recent and remote memory, with an apparently normal attention span and concentration ability.   Speech is normal.  Cranial nerves: Extraocular movements are full.  She can move her eyes without pain.  She has a 1+ left APD.  Color vision is desaturated slightly OD.  Facial strength and sensation was normal.. No obvious hearing deficits are noted.  Motor:  Muscle bulk is normal.   Tone is normal. Strength is  5 / 5 in all 4 extremities.   Sensory: Sensory testing is intact to pinprick, soft touch and vibration sensation in all 4 extremities.  Coordination: Cerebellar testing reveals good finger-nose-finger and heel-to-shin bilaterally.  Gait and station: Station is normal.  Gait is normal Tandem gait is mildly wide.  Romberg negative.  Reflexes: Deep tendon reflexes are symmetric and normal  bilaterally.     DIAGNOSTIC DATA (LABS, IMAGING, TESTING) - I reviewed patient records, labs, notes, testing and imaging myself where available.  Lab Results  Component Value Date   WBC 6.8 10/24/2020   HGB 14.6 10/24/2020   HCT 42.6 10/24/2020   MCV 94.9 10/24/2020   PLT 256 10/24/2020      Component Value Date/Time   NA 143 10/24/2020 0840   NA 137 07/05/2020 1154   K 4.6 10/24/2020 0840   CL 109 10/24/2020 0840   CO2 25 10/24/2020 0840   GLUCOSE 87 10/24/2020 0840   BUN 23 (H) 10/24/2020 0840   BUN 16 07/05/2020 1154   CREATININE 0.97 10/24/2020 0840   CALCIUM 9.0 10/24/2020 0840   PROT 6.8 10/24/2020 0840   ALBUMIN 3.4 (L) 10/24/2020 0840   AST 17 10/24/2020 0840   ALT 16 10/24/2020 0840   ALKPHOS 41 10/24/2020 0840   BILITOT 0.3 10/24/2020 0840   GFRNONAA >60 10/24/2020 0840   No results found for: CHOL, HDL, LDLCALC, LDLDIRECT, TRIG, CHOLHDL Lab Results  Component Value Date   HGBA1C 5.4 04/25/2020       ASSESSMENT AND PLAN  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   Unfortunately, she has had breakthrough activity while on Rituxan and steroid combination therapy.  Therefore, we need to switch to a different disease modifying therapy.  Over the last several years, several studies have shown benefit with Actemra.  I believe this will offer her a better chance of remission.  It is available in monthly IVs as well as weekly shots.  She has poor access and would prefer to do the subcutaneous injections. 2.   She sees her endocrinologist in a couple days.  I have asked to see if she can get CBC with differential, lipid panel and hepatic function panel at that time.  It is a morning appointment so she can be fasting for the lipid panel.  If unable to get the blood work I will place orders for her to come back to our office some morning for the blood work  3.   rtc 6  months    Nyxon Strupp A. Felecia Shelling, MD, [facility] 9/38/1017, 5:10 PM Certified in Neurology, Clinical Neurophysiology, Sleep Medicine and Neuroimaging  [facility] [address] [place], [place] [address] [phone]

## 2021-06-05 ENCOUNTER — Other Ambulatory Visit: Payer: Self-pay | Admitting: *Deleted

## 2021-06-05 ENCOUNTER — Telehealth: Payer: Self-pay | Admitting: *Deleted

## 2021-06-05 ENCOUNTER — Encounter: Payer: Self-pay | Admitting: Neurology

## 2021-06-05 DIAGNOSIS — H469 Unspecified optic neuritis: Secondary | ICD-10-CM

## 2021-06-05 DIAGNOSIS — G378 Other specified demyelinating diseases of central nervous system: Secondary | ICD-10-CM

## 2021-06-05 DIAGNOSIS — G360 Neuromyelitis optica [Devic]: Secondary | ICD-10-CM

## 2021-06-05 DIAGNOSIS — Z79899 Other long term (current) drug therapy: Secondary | ICD-10-CM

## 2021-06-05 DIAGNOSIS — C50312 Malignant neoplasm of lower-inner quadrant of left female breast: Secondary | ICD-10-CM

## 2021-06-05 DIAGNOSIS — Z170 Estrogen receptor positive status [ER+]: Secondary | ICD-10-CM

## 2021-06-05 NOTE — Telephone Encounter (Signed)
Faxed completed/signed Actemra start form to [facility] [employer] at [phone]. Received fax confirmation.  Order: Actemra SQ autoinjector, inject 162mg  once a week. 90 days supply w/ 3 refills. Dx codes: G71.8, H46.9.

## 2021-06-06 ENCOUNTER — Encounter: Payer: Self-pay | Admitting: Internal Medicine

## 2021-06-06 ENCOUNTER — Other Ambulatory Visit: Payer: Self-pay | Admitting: *Deleted

## 2021-06-06 DIAGNOSIS — G360 Neuromyelitis optica [Devic]: Secondary | ICD-10-CM

## 2021-06-06 DIAGNOSIS — G378 Other specified demyelinating diseases of central nervous system: Secondary | ICD-10-CM

## 2021-06-06 DIAGNOSIS — Z79899 Other long term (current) drug therapy: Secondary | ICD-10-CM

## 2021-06-06 DIAGNOSIS — H469 Unspecified optic neuritis: Secondary | ICD-10-CM

## 2021-06-06 NOTE — Telephone Encounter (Signed)
Called pt. She will come next Tuesday morning at our office to complete labs at 8am. I placed orders again and scheduled appt.

## 2021-06-07 ENCOUNTER — Ambulatory Visit: Payer: Commercial Managed Care - PPO | Admitting: Internal Medicine

## 2021-06-07 ENCOUNTER — Ambulatory Visit (HOSPITAL_BASED_OUTPATIENT_CLINIC_OR_DEPARTMENT_OTHER): Payer: Commercial Managed Care - PPO | Admitting: Obstetrics & Gynecology

## 2021-06-07 ENCOUNTER — Encounter (HOSPITAL_BASED_OUTPATIENT_CLINIC_OR_DEPARTMENT_OTHER): Payer: Self-pay | Admitting: Obstetrics & Gynecology

## 2021-06-07 ENCOUNTER — Other Ambulatory Visit: Payer: Self-pay

## 2021-06-07 ENCOUNTER — Encounter: Payer: Self-pay | Admitting: Internal Medicine

## 2021-06-07 ENCOUNTER — Encounter: Payer: Self-pay | Admitting: Neurology

## 2021-06-07 VITALS — BP 121/82 | HR 83 | Ht 65.5 in | Wt 182.6 lb

## 2021-06-07 VITALS — BP 120/82 | HR 85 | Ht 65.5 in | Wt 183.0 lb

## 2021-06-07 DIAGNOSIS — E039 Hypothyroidism, unspecified: Secondary | ICD-10-CM

## 2021-06-07 DIAGNOSIS — Z01419 Encounter for gynecological examination (general) (routine) without abnormal findings: Secondary | ICD-10-CM | POA: Diagnosis not present

## 2021-06-07 DIAGNOSIS — E038 Other specified hypothyroidism: Secondary | ICD-10-CM

## 2021-06-07 DIAGNOSIS — Z170 Estrogen receptor positive status [ER+]: Secondary | ICD-10-CM | POA: Diagnosis not present

## 2021-06-07 DIAGNOSIS — C50312 Malignant neoplasm of lower-inner quadrant of left female breast: Secondary | ICD-10-CM | POA: Diagnosis not present

## 2021-06-07 DIAGNOSIS — G378 Other specified demyelinating diseases of central nervous system: Secondary | ICD-10-CM

## 2021-06-07 DIAGNOSIS — E063 Autoimmune thyroiditis: Secondary | ICD-10-CM

## 2021-06-07 NOTE — Progress Notes (Addendum)
Patient ID: ANJULIE DIPIERRO, female   DOB: 06-25-1970, 51 y.o.   MRN: [id number]  This visit occurred during the SARS-CoV-2 public health emergency.  Safety protocols were in place, including screening questions prior to the visit, additional usage of staff PPE, and extensive cleaning of exam room while observing appropriate contact time as indicated for disinfecting solutions.   HPI  KINDA POTTLE is a 51 y.o.-year-old female, referred by Deanne Coffer*, for management of Hashimoto's hypothyroidism. She previously saw Dr. Buddy Duty.  Pt. has been dx with hypothyroidism in 1996 during infertility evaluation (while living in [place]). She had 2 successful pregnancies since then, after starting on thyroid hormone therapy.   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  She sees Dr. Felecia Shelling at [facility] neurolog[facility].  She tried rituximab and azathioprine, but this did not work.  She is on prednisone, tapering down to the dose slowly, currently on 7.5 mg daily.  Approximately 10 days ago she had a Solu-Medrol course of 3 days (02/14-16/2023).  She is preparing to start Actemra.  She is on Levothyroxine 100 mcg (since last Spring 2022): - fasting - with water - separated by 1h from b'fast  - no iron, PPIs + Hormone Balance multivitamins  - with 150 mcg Biotin  - 1-1.5h later- per [facility]   I reviewed pt's thyroid tests: 10/08/2020: TSH 1.48 08/24/2020: TSH 7.5 07/04/2020: TSH 16.47 10/28/2019: TSH 2.59 08/11/2019: TSH 1.49 06/13/2019: TSH 0.16 (0.34-4.5)  Previously:  2019-06-13     FT4 1.22   0.61-1.12  TSH   2019-01-05    TSH 1.26   0.34-4.50  TSH   2018-10-22    TSH 7.25   0.34-4.50  TSH   2018-09-08    TSH 22.28   0.34-4.50  Free T4   2018-09-08    FT4 0.63   0.61-1.12  T3, Free   2018-09-08    FT3 2.25   2.50-3.90  TSH   2018-07-21    TSH 2.81   0.34-4.50  TSH   2018-06-07    TSH 5.88   0.34-4.50  Free T4   2018-06-07     FT4 1.06   0.61-1.12  T3, Free   2018-06-07    FT3 2.60   2.50-3.90   Thyroid Panel With TSH ([id number])   2018-06-07    Thyroxine (T4) 8.4   4.5-12.0  T3 Uptake 31   24-39  Free Thyroxine Index 2.6   1.2-4.9  TSH   2018-02-23    TSH 1.07   0.34-4.50  TSH   2017-11-19    TSH 2.43   0.34-4.50  TSH   2017-09-10    TSH 0.26   0.34-4.50  Free T4   2017-09-10    FT4 1.00   0.61-1.12  TSH   2017-07-17    TSH 0.12   0.34-4.50  Free T4   2017-07-17    FT4 1.00   0.61-1.12  No results found for: TSH, FREET4, T3FREE  Antithyroid antibodies:    2018-06-07    Thyroid Peroxidase (TPO) Ab 52   0-34  Thyroglobulin Antibody <1.0   0.0-0.9  No results found for: THGAB No components found for: TPOAB  Pt describes: - weight gain - fatigue - insomnia - hot flushes  No: - cold intolerance - constipation - dry skin - hair loss  Pt denies feeling nodules in neck, hoarseness, dysphagia/odynophagia.  She has + FH of thyroid disorders in: M. No FH of thyroid cancer.  No h/o radiation tx to head or neck. No recent use of iodine supplements.  Reviewing Dr. Cindra Eves notes, she had investigation for hirsutism/adrenal disorders in the past: Cortisol, Salivary X2, Timed ([id number])   2017-06-08    #1 Salivary Cortisol 0.032      #2 Salivary Cortisol 0.022       Testosterone   2017-06-02    TESTO 45.5   8.0-48.0  Vitamin B12   2017-06-02    B12 1400   180-914  Vitamin D 25(OH) Total   2017-06-02    17-OH Progesterone LCMS ([id number])   2017-06-02    Androstenedione LCMS ([id number])   2017-06-02    DHEA-Sulfate ([id number])   2017-06-02    DHEA-Sulfate 181.2   41.2-243.7  IGF-1 ([id number])   2017-06-02    Insulin-Like Growth Factor I 134   57-195  Metanephrines, Frac., Pl. Free [phone])   2017-06-02    Metanephrine, Pl 23   0-62  Normetanephrine, Pl 132   0-145   Pt. also has a history of breast cancer, vitamin D deficiency.  For exercise, she is stretching 5 times a week, also walking/biking  occasionally.  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)    Optic neuritis    Past Surgical History:  Procedure Laterality Date   BIOPSY BREAST  10/17/2020   BREAST LUMPECTOMY WITH RADIOACTIVE SEED AND SENTINEL LYMPH NODE BIOPSY Left 11/15/2020   Procedure: LEFT BREAST LUMPECTOMY WITH RADIOACTIVE SEED AND LEFT SENTINEL LYMPH NODE BIOPSY;  Surgeon: Erroll Luna, MD;  Location: [facility];  Service: General;  Laterality: Left;   TUBAL LIGATION  02/2012   Social History   Socioeconomic History   Marital status: Married    Spouse name: Not on file   Number of children: 2   Years of education: BS   Highest education level: Not on file  Occupational History   Occupation: Agricultural consultant   Occupation: Shipping and receiving clerk  Tobacco Use   Smoking status: Never   Smokeless tobacco: Never  Vaping Use   Vaping Use: Never used  Substance and Sexual Activity   Alcohol use: Never   Drug use: Never   Sexual activity: Yes    Birth control/protection: Surgical    Comment: BTL  Other Topics Concern   Not on file  Social History Narrative   Right handed    Caffeine use: rare   Social Determinants of Health   Financial Resource Strain: Low Risk    Difficulty of Paying Living Expenses: Not hard at all  Food Insecurity: No Food Insecurity   Worried  About Charity fundraiser in the Last Year: Never true   Arboriculturist in the Last Year: Never true  Transportation Needs: No Transportation Needs   Lack of Transportation (Medical): No   Lack of Transportation (Non-Medical): No  Physical Activity: Inactive   Days of Exercise per Week: 0 days   Minutes of Exercise per Session: 0  min  Stress: No Stress Concern Present   Feeling of Stress : Not at all  Social Connections: Socially Integrated   Frequency of Communication with Friends and Family: More than three times a week   Frequency of Social Gatherings with Friends and Family: Three times a week   Attends Religious Services: More than 4 times per year   Active Member of Clubs or Organizations: Yes   Attends Archivist Meetings: 1 to 4 times per year   Marital Status: Married  Human resources officer Violence: Not At Risk   Fear of Current or Ex-Partner: No   Emotionally Abused: No   Physically Abused: No   Sexually Abused: No   Current Outpatient Medications on File Prior to Visit  Medication Sig Dispense Refill   ascorbic acid (VITAMIN C) 1000 MG tablet Take 1 tablet by mouth daily.     cholecalciferol (VITAMIN D3) 25 MCG (1000 UNIT) tablet Take 5,000 Units by mouth daily.     levothyroxine (SYNTHROID) 100 MCG tablet Take 100 mcg by mouth daily.     predniSONE (DELTASONE) 5 MG tablet Take up to 3 pills daily as directed (Patient taking differently: 10 mg. Every other day) 100 tablet 3   Probiotic Product (CULTRELLE KIDS IMMUNE DEFENSE PO) Take 1 tablet by mouth daily.     tamoxifen (NOLVADEX) 10 MG tablet Take 1 tablet by mouth daily.     zinc gluconate 50 MG tablet Take 50 mg by mouth daily.     No current facility-administered medications on file prior to visit.   No Known Allergies Family History  Problem Relation Age of Onset   Thyroid disease Mother    Cancer Mother 62       endometrial cancer, lynch negative   GER disease Mother    Other Mother        uterine  fibroids, optic disc abnormality    Eczema Mother    Colon polyps Mother    Hypothyroidism Mother    Osteoarthritis Mother    Hypertension Mother    Migraines Mother    Other Father        Colon adenoma   Hyperlipidemia Father    GER disease Father    Heart murmur Father    Amblyopia Father        Right eye   Neuropathy Father    Glaucoma Maternal Grandmother    Macular degeneration Maternal Grandmother    Multiple sclerosis Paternal Grandmother    Breast cancer Neg Hx    PE: BP 120/82 (BP Location: Right Arm, Patient Position: Sitting, Cuff Size: Normal)    Pulse 85    Ht 5' 5.5" (1.664 m)    Wt 183 lb (83 kg)    LMP 05/22/2021    SpO2 95%    BMI 29.99 kg/m  Wt Readings from Last 3 Encounters:  06/07/21 182 lb 9.6 oz (82.8 kg)  06/04/21 186 lb 8 oz (84.6 kg)  04/11/21 175 lb 8 oz (79.6 kg)   Constitutional: overweight, in NAD Eyes: PERRLA, EOMI, no exophthalmos ENT: moist mucous membranes, no thyromegaly, no cervical lymphadenopathy Cardiovascular: RRR, No MRG Respiratory: CTA B Gastrointestinal: abdomen soft, NT, ND, BS+ Musculoskeletal: no deformities, strength intact in all 4 Skin: moist, warm, no rashes Neurological: no tremor with outstretched hands, DTR normal in all 4  ASSESSMENT: 1. Hypothyroidism  PLAN:  1. Patient with long-standing hypothyroidism, on levothyroxine therapy.  Latest dose increase was last year after she had elevated TSH levels. -  she appears euthyroid.  She does not feel that this dose of levothyroxine is inadequate for her. - she does not appear to have a goiter, thyroid nodules, or neck compression symptoms.  She remembers having had a thyroid ultrasound in the past without significant findings. - We discussed about correct intake of levothyroxine, fasting, with water, separated by at least 30 minutes from breakfast, and separated by more than 4 hours from calcium, iron, multivitamins, acid reflux medications (PPIs).  She is taking it along  with tamoxifen in the morning, which we discussed that for now she can continue.  Ideally, levothyroxine is taken by itself, but we may need to separate these if the TSH does not stabilize.  For now, I believe that the biggest culprit of the variability in her previous TFTs could have been the fact that she is taking a hormonal support supplement including calcium (560 mg) to close to levothyroxine.  I advised her to move it later in the day.  We also discussed that if these measures do not help in stabilizing her thyroid hormone levels, we may need to switch to brand-name Synthroid.  She agrees with this plan. - will check thyroid tests in 1 to 2 weeks, to put as much distance as possible between her Solu-Medrol course from 10 days ago and the lab draw: TSH, free T3 , free T4.  I did advise her to not take the prednisone right before lab draw but to bring it with her and take it afterwards. - If labs today are abnormal, she will need to return in ~5-6 weeks for repeat labs - Otherwise, I will see her back in 6 months  Orders Placed This Encounter  Procedures   TSH   T4, free   T3, free   Addendum (06/18/2021): Component     Latest Ref Rng & Units 06/11/2021  T4,Free(Direct)     0.82 - 1.77 ng/dL 1.39  Triiodothyronine,Free,Serum     2.0 - 4.4 pg/mL 2.3  TSH     0.450 - 4.500 uIU/mL 8.470 (H)  TSH is elevated.  We will increase the dose of levothyroxine to 112 mcg daily and I will advise her to try to move tamoxifen 30 minutes later, if possible.  Also, I will advise her to try to chew the levothyroxine tablet for better absorption.  Philemon Kingdom, MD PhD [facility]

## 2021-06-07 NOTE — Progress Notes (Signed)
51 y.o. [id number] [name] here for annual exam/new patient exam.    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  Is followed by Dr. Felecia Shelling, neurology, and Dr. Marica Otter, ophthalmology.  Is hopefully going to try a new medication for this.  Does still cycle.  Cycles were regular up until Tamoxifen.  Has been on this since November.  Had lumpectomy with radiation.  Will be on Tamoxifen x 5 years.    Patient's last menstrual period was 05/22/2021.          Sexually active: Yes.    The current method of family planning is tubal ligation.    Exercising: tries Smoker:  no  Health Maintenance: Pap:  06/13/19 neg, neg HR HPV. History of abnormal Pap:  no MMG:  due may, 2023 Colonoscopy:  discussed guidelines BMD:   not indicated Screening Labs: will do with new pap   reports that she has never smoked. She has never used smokeless tobacco. She reports that she does not drink alcohol and does not use drugs.  Past Medical History:  Diagnosis Date   Cancer ([place])    left breast   Hashimoto's disease    Hypothyroidism    Mitral regurgitation    mild by echo 06/2020   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)    Optic neuritis     Past Surgical History:  Procedure Laterality Date   BIOPSY BREAST  10/17/2020   no results yet   BREAST LUMPECTOMY WITH RADIOACTIVE SEED AND SENTINEL LYMPH NODE BIOPSY Left 11/15/2020   Procedure: LEFT BREAST LUMPECTOMY WITH RADIOACTIVE SEED AND LEFT SENTINEL LYMPH NODE BIOPSY;  Surgeon: Erroll Luna, MD;  Location: [facility];  Service: General;  Laterality: Left;   TUBAL LIGATION  02/2012    Current Outpatient Medications  Medication Sig Dispense Refill   ascorbic acid (VITAMIN C) 1000 MG tablet Take 1 tablet by mouth daily.     cholecalciferol (VITAMIN D3) 25 MCG (1000 UNIT) tablet Take 5,000 Units by mouth daily.     levothyroxine (SYNTHROID) 100 MCG tablet Take 100 mcg by mouth  daily.     predniSONE (DELTASONE) 5 MG tablet Take up to 3 pills daily as directed (Patient taking differently: 10 mg. Every other day) 100 tablet 3   Probiotic Product (CULTRELLE KIDS IMMUNE DEFENSE PO) Take 1 tablet by mouth daily.     tamoxifen (NOLVADEX) 10 MG tablet Take 1 tablet by mouth daily.     zinc gluconate 50 MG tablet Take 50 mg by mouth daily.     No current facility-administered medications for this visit.    Family History  Problem Relation Age of Onset   Multiple sclerosis Paternal Grandmother    Glaucoma Maternal Grandmother    Macular degeneration Maternal Grandmother    Other Father        Colon adenoma   Hyperlipidemia Father    GER disease Father    Heart murmur Father    Amblyopia Father        Right eye   Neuropathy Father    Cancer Mother 20       endometrial cancer   GER disease Mother    Other Mother        uterine fibroids, optic disc abnormality    Eczema Mother    Colon polyps Mother    Hypothyroidism Mother    Osteoarthritis Mother    Hypertension Mother    Migraines Mother  Breast cancer Neg Hx     Review of Systems  All other systems reviewed and are negative.  Exam:   BP 121/82    Pulse 83    Ht 5' 5.5" (1.664 m)    Wt 182 lb 9.6 oz (82.8 kg)    LMP 05/22/2021    BMI 29.92 kg/m   Height: 5' 5.5" (166.4 cm)  General appearance: alert, cooperative and appears stated age Head: Normocephalic, without obvious abnormality, atraumatic Neck: no adenopathy, supple, symmetrical, trachea midline and thyroid normal to inspection and palpation Lungs: clear to auscultation bilaterally Breasts:  left breast with well healed scar and radiation changes, firmness measuring 3 x 3 cm above incision, no nipple discharge or LAD; right breast without masses, skin changes, LAD, nipple discharge Heart: regular rate and rhythm Abdomen: soft, non-tender; bowel sounds normal; no masses,  no organomegaly Extremities: extremities normal, atraumatic, no  cyanosis or edema Skin: Skin color, texture, turgor normal. No rashes or lesions Lymph nodes: Cervical, supraclavicular, and axillary nodes normal. No abnormal inguinal nodes palpated Neurologic: Grossly normal   Pelvic: External genitalia:  no lesions              Urethra:  normal appearing urethra with no masses, tenderness or lesions              Bartholins and Skenes: normal                 Vagina: normal appearing vagina with normal color and no discharge, no lesions              Cervix: no lesions              Pap taken: No. Bimanual Exam:  Uterus:  normal size, contour, position, consistency, mobility, non-tender              Adnexa: normal adnexa and no mass, fullness, tenderness               Rectovaginal: Confirms               Anus:  normal sphincter tone, no lesions  Chaperone, Octaviano Batty, CMA, was present for exam.  Assessment/Plan: 1. Well woman exam with routine gynecological exam - pap neg with neg HR HPV 2021.  Will repeat next year. - will have diagnostic MMG in May - colon cancer screening discussed.  Aware to try and do this year. - will have lab work when sees new PCP.  None drawn today. - vaccines reviewed/updated  2. Malignant neoplasm of lower-inner quadrant of left breast in female, estrogen receptor positive ([place]) - followed by Dr. Lindi Adie - on Tamoxifen.  Relation to endometrial cancer discussed.  Pt advised to call with any abnormal bleeding issues  3. Myelin oligodendrocyte glycoprotein antibody disorder (MOGAD) ([place]) - followed by Dr. Felecia Shelling  4. Hypothyroidism, unspecified type - on replacement  5.  Family history of endometrial cancer in her mother - mother's genetic testing was negative.

## 2021-06-07 NOTE — Patient Instructions (Addendum)
Please continue Levothyroxine 100 mcg daily.  Take the thyroid hormone every day, with water, at least 30 minutes before breakfast, separated by at least 4 hours from: - acid reflux medications - calcium - iron - multivitamins  Please have thyroid labs in 1-2 weeks.  Please come back for a follow-up appointment in 6 months.

## 2021-06-10 ENCOUNTER — Encounter: Payer: Self-pay | Admitting: Family Medicine

## 2021-06-10 ENCOUNTER — Other Ambulatory Visit: Payer: Self-pay | Admitting: *Deleted

## 2021-06-10 ENCOUNTER — Other Ambulatory Visit: Payer: Self-pay

## 2021-06-10 ENCOUNTER — Ambulatory Visit: Payer: Commercial Managed Care - PPO | Admitting: Family Medicine

## 2021-06-10 VITALS — BP 118/78 | HR 81 | Temp 97.7°F | Ht 66.0 in | Wt 181.5 lb

## 2021-06-10 DIAGNOSIS — Z1211 Encounter for screening for malignant neoplasm of colon: Secondary | ICD-10-CM | POA: Diagnosis not present

## 2021-06-10 DIAGNOSIS — E039 Hypothyroidism, unspecified: Secondary | ICD-10-CM

## 2021-06-10 DIAGNOSIS — E038 Other specified hypothyroidism: Secondary | ICD-10-CM

## 2021-06-10 DIAGNOSIS — G378 Other specified demyelinating diseases of central nervous system: Secondary | ICD-10-CM | POA: Diagnosis not present

## 2021-06-10 DIAGNOSIS — C50312 Malignant neoplasm of lower-inner quadrant of left female breast: Secondary | ICD-10-CM | POA: Diagnosis not present

## 2021-06-10 DIAGNOSIS — G3781 Myelin oligodendrocyte glycoprotein antibody disease: Secondary | ICD-10-CM

## 2021-06-10 DIAGNOSIS — Z170 Estrogen receptor positive status [ER+]: Secondary | ICD-10-CM

## 2021-06-10 NOTE — Patient Instructions (Addendum)
Welcome to [facility]! It was a pleasure meeting you today.  As discussed, Please schedule a physical sch  PLEASE NOTE:  If you had any LAB tests please let us know if you have not heard back within a few days. You may see your results on MyChart before we have a chance to review them but we will give you a call once they are reviewed by [place]. If we ordered any REFERRALS today, please let us know if you have not heard from their office within the next week.  Let us know through MyChart if you are needing REFILLS, or have your pharmacy send [place] the request. You can also use MyChart to communicate with me or any office staff.  Please try these tips to maintain a healthy lifestyle:  Eat most of your calories during the day when you are active. Eliminate processed foods including packaged sweets (pies, cakes, cookies), reduce intake of potatoes, white bread, white pasta, and white rice. Look for whole grain options, oat flour or almond flour.  Each meal should contain half fruits/vegetables, one quarter protein, and one quarter carbs (no bigger than a computer mouse).  Cut down on sweet beverages. This includes juice, soda, and sweet tea. Also watch fruit intake, though this is a healthier sweet option, it still contains natural sugar! Limit to 3 servings daily.  Drink at least 1 glass of water with each meal and aim for at least 8 glasses per day  Exercise at least 150 minutes every week.

## 2021-06-10 NOTE — Progress Notes (Signed)
New Patient Office Visit  Subjective:  Patient ID: Laiba Fuerte, female    DOB: 03-16-1971  Age: 51 y.o. MRN: [id number]  CC:  Chief Complaint  Patient presents with   Establish Care    Not fasting    HPI Anhthu Perdew presents for new pt.  Had covid 11/2019  Hypothyroidism-seeing Endocrine MoGAD-Jan 2022-had visual changes and eye pain.  Saw Dr. Isaias Sakai neuritis. Then saw retina-told poss MS-admitted to hosp-solumedrol tx for 5 days. Then 1 wk later, other eye started.  Got many solumedrol tx rounds.  Saw Neuro-azothiaprine not work. Then Retuxomab.  On pred still-trying to wean but flared.  Trying to get Actemera approved.  Hard to exercise as if core gets hot, then vision worse.  Has e bike. Sep 11, 2020-mamm abn and dx L breast ca-got bx and rad and tamoxifen  No colon ever.   Past Medical History:  Diagnosis Date   Cancer ([place])    left breast   Hashimoto's disease    Hypothyroidism    Mitral regurgitation    mild by echo 06/2020   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)    Optic neuritis     Past Surgical History:  Procedure Laterality Date   BIOPSY BREAST  10/17/2020   BREAST LUMPECTOMY WITH RADIOACTIVE SEED AND SENTINEL LYMPH NODE BIOPSY Left 11/15/2020   Procedure: LEFT BREAST LUMPECTOMY WITH RADIOACTIVE SEED AND LEFT SENTINEL LYMPH NODE BIOPSY;  Surgeon: Erroll Luna, MD;  Location: [facility];  Service: General;  Laterality: Left;   TUBAL LIGATION  02/2012    Family History  Problem Relation Age of Onset   Hearing loss Mother    Thyroid disease Mother    Cancer Mother 38       endometrial cancer, lynch negative   GER disease Mother    Other Mother        uterine fibroids, optic disc abnormality    Eczema Mother    Colon polyps Mother    Hypothyroidism Mother    Osteoarthritis Mother    Hypertension Mother    Migraines Mother    Hearing loss Father    Other Father        Colon  adenoma   Hyperlipidemia Father    GER disease Father    Heart murmur Father    Amblyopia Father        Right eye   Neuropathy Father    Glaucoma Maternal Grandmother    Macular degeneration Maternal Grandmother    Multiple sclerosis Paternal Grandmother    Breast cancer Neg Hx     Social History   Socioeconomic History   Marital status: Married    Spouse name: Not on file   Number of children: 2   Years of education: BS   Highest education level: Not on file  Occupational History   Occupation: Agricultural consultant   Occupation: Shipping and receiving clerk  Tobacco Use   Smoking status: Never   Smokeless tobacco: Never  Vaping Use   Vaping Use: Never used  Substance and Sexual Activity   Alcohol use: Never   Drug use: Never   Sexual activity: Yes    Birth control/protection: Surgical    Comment: BTL  Other Topics Concern   Not on file  Social History Narrative   Right handed    Caffeine use: rare   Social Determinants of Health   Financial Resource Strain: Low Risk    Difficulty of Paying Living Expenses:  Not hard at all  Food Insecurity: No Food Insecurity   Worried About Charity fundraiser in the Last Year: Never true   Ran Out of Food in the Last Year: Never true  Transportation Needs: No Transportation Needs   Lack of Transportation (Medical): No   Lack of Transportation (Non-Medical): No  Physical Activity: Inactive   Days of Exercise per Week: 0 days   Minutes of Exercise per Session: 0 min  Stress: No Stress Concern Present   Feeling of Stress : Not at all  Social Connections: Socially Integrated   Frequency of Communication with Friends and Family: More than three times a week   Frequency of Social Gatherings with Friends and Family: Three times a week   Attends Religious Services: More than 4 times per year   Active Member of Clubs or Organizations: Yes   Attends Archivist Meetings: 1 to 4 times per year   Marital Status: Married   Human resources officer Violence: Not At Risk   Fear of Current or Ex-Partner: No   Emotionally Abused: No   Physically Abused: No   Sexually Abused: No    ROS  ROS: Gen: no fever, chills  Skin: no rash, itching ENT: no ear pain, ear drainage, nasal congestion, rhinorrhea, sinus pressure, sore throat.  Had been having throat clearing until this past solumedrol. Eyes: see HPI Resp: no cough, wheeze,SOB Breast: no breast tenderness, no nipple discharge, no breast masses CV: no CP, palpitations, LE edema,  GI: no heartburn, n/v/d/c, abd pain GU: no dysuria, urgency, frequency, hematuria MSK: some B elbow pain Neuro: no dizziness, headache, weakness, vertigo Psych: no depression, anxiety, insomnia, SI   Objective:   Today's Vitals: BP 118/78    Pulse 81    Temp 97.7 F (36.5 C) (Temporal)    Ht 5\' 6"  (1.676 m)    Wt 181 lb 8 oz (82.3 kg)    LMP 05/22/2021    SpO2 96%    BMI 29.29 kg/m   Gen: WDWN NAD OWWF HEENT: NCAT, conjunctiva not injected, sclera nonicteric NECK:  supple, no thyromegaly, no nodes, no carotid bruits CARDIAC: RRR, S1S2+, no murmur. DP 2+B LUNGS: CTAB. No wheezes ABDOMEN:  BS+, soft, NTND, No HSM, no masses EXT:  no edema MSK: no gross abnormalities.  NEURO: A&O x3.  CN II-XII intact.  PSYCH: normal mood. Good eye contact   Assessment & Plan:   Problem List Items Addressed This Visit       Endocrine   Hypothyroid - Primary   Relevant Orders   Basic Metabolic Panel (BMET)     Nervous and Auditory   Myelin oligodendrocyte glycoprotein antibody disorder (MOGAD) ([place])     Other   Malignant neoplasm of lower-inner quadrant of left breast in female, estrogen receptor positive ([place])   Other Visit Diagnoses     Screening for malignant neoplasm of colon       Relevant Orders   Ambulatory referral to Gastroenterology      Hypothyroidism-managed by Endo.  Labs tomorrow.  MOGAD-optic neuritis-working w/neuro.  On pred.  Trying to get actemera approved. L  breast Ca-s/p lumpectomy and rad RHM-has cpx end of March.  Will  order screening colon.  Pap utd.  Titer for Chicken pox+,  wants to wait on shingrix.  As lipids, LFT's, cbc, tsh being checked by neruo, will add bmp for completeness.  Outpatient Encounter Medications as of 06/10/2021  Medication Sig   ascorbic acid (VITAMIN C) 1000 MG  tablet Take 1 tablet by mouth daily.   cholecalciferol (VITAMIN D3) 25 MCG (1000 UNIT) tablet Take 5,000 Units by mouth daily.   levothyroxine (SYNTHROID) 100 MCG tablet Take 100 mcg by mouth daily.   Magnesium-Potassium (MAG-K/CHELA-MAX PO) Take by mouth.   omeprazole (PRILOSEC) 20 MG capsule Take 20 mg by mouth daily.   predniSONE (DELTASONE) 5 MG tablet Take up to 3 pills daily as directed (Patient taking differently: 10 mg. Every other day)   Probiotic Product (CULTRELLE KIDS IMMUNE DEFENSE PO) Take 1 tablet by mouth daily.   tamoxifen (NOLVADEX) 10 MG tablet Take 1 tablet by mouth daily.   TURMERIC PO Take by mouth.   UNABLE TO FIND Take by mouth daily. Med Name: [place] TO FIND Med Name: REL   UNABLE TO FIND Med Name: LymphActive   zinc gluconate 50 MG tablet Take 50 mg by mouth daily.   No facility-administered encounter medications on file as of 06/10/2021.    Follow-up: Return if symptoms worsen or fail to improve, for physical already sch.   Wellington Hampshire, MD

## 2021-06-11 ENCOUNTER — Other Ambulatory Visit (INDEPENDENT_AMBULATORY_CARE_PROVIDER_SITE_OTHER): Payer: Self-pay

## 2021-06-11 ENCOUNTER — Telehealth: Payer: Self-pay | Admitting: *Deleted

## 2021-06-11 DIAGNOSIS — E039 Hypothyroidism, unspecified: Secondary | ICD-10-CM

## 2021-06-11 DIAGNOSIS — G360 Neuromyelitis optica [Devic]: Secondary | ICD-10-CM

## 2021-06-11 DIAGNOSIS — E038 Other specified hypothyroidism: Secondary | ICD-10-CM

## 2021-06-11 DIAGNOSIS — G378 Other specified demyelinating diseases of central nervous system: Secondary | ICD-10-CM

## 2021-06-11 DIAGNOSIS — Z79899 Other long term (current) drug therapy: Secondary | ICD-10-CM

## 2021-06-11 DIAGNOSIS — H469 Unspecified optic neuritis: Secondary | ICD-10-CM

## 2021-06-11 DIAGNOSIS — Z0289 Encounter for other administrative examinations: Secondary | ICD-10-CM

## 2021-06-11 NOTE — Telephone Encounter (Signed)
Submitted PA Actemra on CMM.  Key: [id number] - PA Case ID: [id number]. Waiting on determination from [facility].

## 2021-06-12 LAB — LIPID PANEL

## 2021-06-12 NOTE — Telephone Encounter (Signed)
PA denied, sent denial to [facility], asked to screen for free drug/financial assistance.  ? ?Faxed completed/signed Foundation form back to [facility] as well at [phone]. Received fax confirmation.  ?

## 2021-06-18 ENCOUNTER — Encounter: Payer: Self-pay | Admitting: Hematology and Oncology

## 2021-06-18 ENCOUNTER — Encounter: Payer: Self-pay | Admitting: Neurology

## 2021-06-18 ENCOUNTER — Encounter: Payer: Self-pay | Admitting: Family Medicine

## 2021-06-18 ENCOUNTER — Encounter: Payer: Self-pay | Admitting: Internal Medicine

## 2021-06-18 LAB — CBC WITH DIFFERENTIAL/PLATELET
Basophils Absolute: 0 10*3/uL (ref 0.0–0.2)
Basos: 0 %
EOS (ABSOLUTE): 0.1 10*3/uL (ref 0.0–0.4)
Eos: 1 %
Hematocrit: 44.5 % (ref 34.0–46.6)
Hemoglobin: 14.4 g/dL (ref 11.1–15.9)
Immature Grans (Abs): 0 10*3/uL (ref 0.0–0.1)
Immature Granulocytes: 0 %
Lymphocytes Absolute: 0.9 10*3/uL (ref 0.7–3.1)
Lymphs: 13 %
MCH: 31.4 pg (ref 26.6–33.0)
MCHC: 32.4 g/dL (ref 31.5–35.7)
MCV: 97 fL (ref 79–97)
Monocytes Absolute: 0.7 10*3/uL (ref 0.1–0.9)
Monocytes: 10 %
Neutrophils Absolute: 5.4 10*3/uL (ref 1.4–7.0)
Neutrophils: 76 %
Platelets: 221 10*3/uL (ref 150–450)
RBC: 4.59 x10E6/uL (ref 3.77–5.28)
RDW: 12.1 % (ref 11.7–15.4)
WBC: 7.1 10*3/uL (ref 3.4–10.8)

## 2021-06-18 LAB — HEPATIC FUNCTION PANEL
ALT: 26 IU/L (ref 0–32)
AST: 16 IU/L (ref 0–40)
Albumin: 4 g/dL (ref 3.8–4.9)
Alkaline Phosphatase: 36 IU/L — ABNORMAL LOW (ref 44–121)
Bilirubin Total: <0.2 mg/dL (ref 0.0–1.2)
Bilirubin, Direct: 0.11 mg/dL (ref 0.00–0.40)
Total Protein: 6.2 g/dL (ref 6.0–8.5)

## 2021-06-18 LAB — BASIC METABOLIC PANEL WITH GFR
BUN/Creatinine Ratio: 17 (ref 9–23)
BUN: 17 mg/dL (ref 6–24)
CO2: 22 mmol/L (ref 20–29)
Calcium: 8.7 mg/dL (ref 8.7–10.2)
Chloride: 105 mmol/L (ref 96–106)
Creatinine, Ser: 0.98 mg/dL (ref 0.57–1.00)
Glucose: 91 mg/dL (ref 70–99)
Potassium: 4.9 mmol/L (ref 3.5–5.2)
Sodium: 139 mmol/L (ref 134–144)
eGFR: 70 mL/min/{1.73_m2}

## 2021-06-18 LAB — LIPID PANEL
Chol/HDL Ratio: 3.4 ratio (ref 0.0–4.4)
Cholesterol, Total: 172 mg/dL (ref 100–199)
HDL: 51 mg/dL (ref >39)
LDL Chol Calc (NIH): 100 mg/dL — ABNORMAL HIGH (ref 0–99)
Triglycerides: 119 mg/dL (ref 0–149)
VLDL Cholesterol Cal: 21 mg/dL (ref 5–40)

## 2021-06-18 LAB — T3, FREE: T3, Free: 2.3 pg/mL (ref 2.0–4.4)

## 2021-06-18 LAB — T4, FREE: Free T4: 1.39 ng/dL (ref 0.82–1.77)

## 2021-06-18 LAB — TSH: TSH: 8.47 u[IU]/mL — ABNORMAL HIGH (ref 0.450–4.500)

## 2021-06-18 MED ORDER — LEVOTHYROXINE SODIUM 112 MCG PO TABS: 112.0000 ug | ORAL_TABLET | Freq: Every day | ORAL | 3 refills | Status: DC

## 2021-06-18 NOTE — Addendum Note (Signed)
Addended by: Philemon Kingdom on: 06/18/2021 12:38 PM   Modules accepted: Orders

## 2021-06-26 ENCOUNTER — Encounter: Payer: Self-pay | Admitting: Neurology

## 2021-07-10 ENCOUNTER — Encounter: Payer: Self-pay | Admitting: Internal Medicine

## 2021-07-10 ENCOUNTER — Ambulatory Visit (INDEPENDENT_AMBULATORY_CARE_PROVIDER_SITE_OTHER): Payer: Commercial Managed Care - PPO | Admitting: Family Medicine

## 2021-07-10 ENCOUNTER — Encounter: Payer: Self-pay | Admitting: Family Medicine

## 2021-07-10 VITALS — BP 112/80 | HR 82 | Temp 98.1°F | Ht 66.0 in | Wt 182.4 lb

## 2021-07-10 DIAGNOSIS — Z0000 Encounter for general adult medical examination without abnormal findings: Secondary | ICD-10-CM | POA: Diagnosis not present

## 2021-07-10 NOTE — Progress Notes (Signed)
?Phone [phone] ?  ?Subjective:  ? ?Patient is a 51 y.o. female presenting for annual physical.   ? ?Got Actemra.  Will possibly taper pred end of April-labs q 81mofor liver. ?Colon sch for 09/06/21 ? ?Chief Complaint  ?Patient presents with  ? Annual Exam  ? ? ?See problem oriented charting- ?ROS- ROS: ?Gen: no fever, chills  ?Skin: no rash, itching ?ENT: no ear pain, ear drainage, nasal congestion, rhinorrhea, sinus pressure, sore throat ?Eyes: no blurry vision, double vision ?Resp: no cough, wheeze,SOB ?CV: no CP, palpitations, LE edema,  ?GI: no heartburn, n/v/d/c, abd pain ?GU: no dysuria, urgency, frequency, hematuria.   Cough incontinence ?MSK: no joint pain, myalgias, back pain ?Neuro: no dizziness, headache, weakness, vertigo ?Psych: no depression, anxiety, insomnia, SI  ?Had URI for 2 wks, better now. ? ?The following were reviewed and entered/updated in epic: ?Past Medical History:  ?Diagnosis Date  ? Cancer ([facility]   ? left breast  ? Hashimoto's disease   ? Hypothyroidism   ? Mitral regurgitation   ? mild by echo 06/2020  ? Myelin oligodendrocyte glycoprotein antibody disorder (MOGAD) ([facility]   ? Optic neuritis   ? ?Patient Active Problem List  ? Diagnosis Date Noted  ? Hypothyroidism due to Hashimoto's thyroiditis 06/07/2021  ? Malignant neoplasm of lower-inner quadrant of left breast in female, estrogen receptor positive ([place] 10/23/2020  ? Optic neuritis, left 10/18/2020  ? Myelin oligodendrocyte glycoprotein antibody disorder (MOGAD) ([place] 07/10/2020  ? Mitral regurgitation   ? High risk medication use 05/07/2020  ? Neuromyelitis optica (devic) ([place] 05/07/2020  ? Hypothyroid 04/25/2020  ? Optic neuritis, right 04/24/2020  ? ?Past Surgical History:  ?Procedure Laterality Date  ? BIOPSY BREAST  10/17/2020  ? BREAST LUMPECTOMY WITH RADIOACTIVE SEED AND SENTINEL LYMPH NODE BIOPSY Left 11/15/2020  ? Procedure: LEFT BREAST LUMPECTOMY WITH RADIOACTIVE SEED AND LEFT SENTINEL LYMPH NODE BIOPSY;  Surgeon:  CErroll Luna MD;  [place] [facility]  Service: General;  Laterality: Left;  ? TUBAL LIGATION  02/2012  ? ? ?Family History  ?Problem Relation Age of Onset  ? Hearing loss Mother   ? Thyroid disease Mother   ? Cancer Mother 775 ?     endometrial cancer, lynch negative  ? GER disease Mother   ? Other Mother   ?     uterine fibroids, optic disc abnormality   ? Eczema Mother   ? Colon polyps Mother   ? Hypothyroidism Mother   ? Osteoarthritis Mother   ? Hypertension Mother   ? Migraines Mother   ? Hearing loss Father   ? Other Father   ?     Colon adenoma  ? Hyperlipidemia Father   ? GER disease Father   ? Heart murmur Father   ? Amblyopia Father   ?     Right eye  ? Neuropathy Father   ? Glaucoma Maternal Grandmother   ? Macular degeneration Maternal Grandmother   ? Multiple sclerosis Paternal Grandmother   ? Breast cancer Neg Hx   ? ? ?Medications- reviewed and updated ?Current Outpatient Medications  ?Medication Sig Dispense Refill  ? ascorbic acid (VITAMIN C) 1000 MG tablet Take 1 tablet by mouth daily.    ? cholecalciferol (VITAMIN D3) 25 MCG (1000 UNIT) tablet Take 5,000 Units by mouth daily.    ? levothyroxine (SYNTHROID) 112 MCG tablet Take 1 tablet (112 mcg total) by mouth daily. 45 tablet 3  ? Magnesium-Potassium (MAG-K/CHELA-MAX PO) Take by mouth.    ?  omeprazole (PRILOSEC) 20 MG capsule Take 20 mg by mouth daily.    ? predniSONE (DELTASONE) 5 MG tablet Take up to 3 pills daily as directed (Patient taking differently: 10 mg. Every other day) 100 tablet 3  ? Probiotic Product (CULTRELLE KIDS IMMUNE DEFENSE PO) Take 1 tablet by mouth daily.    ? tamoxifen (NOLVADEX) 10 MG tablet Take 1 tablet by mouth daily.    ? Tocilizumab (ACTEMRA) 162 MG/0.9ML SOSY     ? UNABLE TO FIND Take 2 tablets by mouth daily. Med Name: Omega Pure ?1 tablet in AM and 1 tablet in PM    ? UNABLE TO FIND Take by mouth daily. Med Name: Ultra Inflamix 2 scoops daily    ? UNABLE TO FIND Take 2 tablets by mouth daily.  Med Name: R-Lipoic Acid    ? ?No current facility-administered medications for this visit.  ? ? ?Allergies-reviewed and updated ?No Known Allergies ? ?Social History  ? ?Social History Narrative  ? Right handed   ? Caffeine use: rare  ? ?Objective  ?Objective:  ?BP 112/80   Pulse 82   Temp 98.1 ?F (36.7 ?C) (Temporal)   Ht '5\' 6"'$  (1.676 m)   Wt 182 lb 6 oz (82.7 kg)   LMP 06/20/2021 (Exact Date)   SpO2 97%   BMI 29.44 kg/m?  ?Physical Exam  ?Gen: WDWN NAD ?HEENT: NCAT, conjunctiva not injected, sclera nonicteric ?TM WNL B, OP moist, no exudates  ?NECK:  supple, no thyromegaly, no nodes, no carotid bruits ?CARDIAC: RRR, S1S2+, no murmur. DP 2+B ?LUNGS: CTAB. No wheezes ?ABDOMEN:  BS+, soft, NTND, No HSM, no masses ?EXT:  no edema ?MSK: no gross abnormalities.  ?NEURO: A&O x3.  CN II-XII intact.  ?PSYCH: normal mood. Good eye contact  ?  ?Assessment and Plan  ? ?Health Maintenance counseling: ?1. Anticipatory guidance: Patient counseled regarding regular dental exams q6 months, eye exams,  avoiding smoking and second hand smoke, limiting alcohol to 1 beverage per day, no illicit drugs.   ?2. Risk factor reduction:  Advised patient of need for regular exercise and diet rich and fruits and vegetables to reduce risk of heart attack and stroke. Exercise- intermitt.  ?Wt Readings from Last 3 Encounters:  ?07/10/21 182 lb 6 oz (82.7 kg)  ?06/10/21 181 lb 8 oz (82.3 kg)  ?06/07/21 183 lb (83 kg)  ? ?3. Immunizations/screenings/ancillary studies ?Immunization History  ?Administered Date(s) Administered  ? Td 05/22/2015  ? Tdap 02/05/2007  ? ?Health Maintenance Due  ?Topic Date Due  ? COLONOSCOPY (Pts 45-25yr Insurance coverage will need to be confirmed)  Never done  ?  ?4. Cervical cancer screening- UTD ?5. Breast cancer screening-  mammogram order in, will sch ?6. Colon cancer screening - scheduled in May ?7. Skin cancer screening- advised regular sunscreen use. Denies worrisome, changing, or new skin lesions.  ?8.  Birth control/STD check- n/a ?9. Osteoporosis screening- n/a ?10. Smoking associated screening - non smoker ? ?Problem List Items Addressed This Visit   ?None ?Visit Diagnoses   ? ? Wellness examination    -  Primary  ? ?  ? ? ?Recommended follow up: annual Return in about 1 year (around 07/11/2022) for annual. ?Future Appointments  ?Date Time Provider [place] ?07/26/2021  9:00 AM [facility] LAB [facility] None  ?08/09/2021 10:00 AM [facility] PREVISIT RM 51 [facility]  ?09/06/2021  8:30 AM DSharyn Creamer MD [facility]  ?09/19/2021  3:30 PM [facility]  ?  10/17/2021  4:00 PM Sater, Nanine Means, MD [facility] None  ?12/05/2021  4:00 PM Philemon Kingdom, MD [facility] None  ?06/19/2022  3:45 PM Megan Salon, MD [facility]  ? ? ?Lab/Order associations:n/a fasting ?  ICD-10-CM   ?1. Wellness examination  Z00.00   ?  ? Wellness-antic guidance.  Pt has colon sch, she will call for mamm.  Work on [employer]. ? ?No orders of the defined types were placed in this encounter. ? ? ?Wellington Hampshire, MD ? ? ?

## 2021-07-10 NOTE — Patient Instructions (Signed)
It was very nice to see you today!  Great job   PLEASE NOTE:  If you had any lab tests please let us know if you have not heard back within a few days. You may see your results on MyChart before we have a chance to review them but we will give you a call once they are reviewed by us. If we ordered any referrals today, please let us know if you have not heard from their office within the next week.   Please try these tips to maintain a healthy lifestyle:  Eat most of your calories during the day when you are active. Eliminate processed foods including packaged sweets (pies, cakes, cookies), reduce intake of potatoes, white bread, white pasta, and white rice. Look for whole grain options, oat flour or almond flour.  Each meal should contain half fruits/vegetables, one quarter protein, and one quarter carbs (no bigger than a computer mouse).  Cut down on sweet beverages. This includes juice, soda, and sweet tea. Also watch fruit intake, though this is a healthier sweet option, it still contains natural sugar! Limit to 3 servings daily.  Drink at least 1 glass of water with each meal and aim for at least 8 glasses per day  Exercise at least 150 minutes every week.   

## 2021-07-24 ENCOUNTER — Encounter: Payer: Self-pay | Admitting: Internal Medicine

## 2021-07-26 ENCOUNTER — Encounter: Payer: Self-pay | Admitting: Internal Medicine

## 2021-07-26 ENCOUNTER — Other Ambulatory Visit (INDEPENDENT_AMBULATORY_CARE_PROVIDER_SITE_OTHER): Payer: Commercial Managed Care - PPO

## 2021-07-26 ENCOUNTER — Other Ambulatory Visit: Payer: Self-pay | Admitting: Internal Medicine

## 2021-07-26 DIAGNOSIS — E038 Other specified hypothyroidism: Secondary | ICD-10-CM | POA: Diagnosis not present

## 2021-07-26 DIAGNOSIS — E063 Autoimmune thyroiditis: Secondary | ICD-10-CM | POA: Diagnosis not present

## 2021-07-26 LAB — TSH: TSH: 0.14 u[IU]/mL — ABNORMAL LOW (ref 0.35–5.50)

## 2021-07-26 LAB — T4, FREE: Free T4: 1.29 ng/dL (ref 0.60–1.60)

## 2021-07-26 MED ORDER — LEVOTHYROXINE SODIUM 100 MCG PO TABS: 100.0000 ug | ORAL_TABLET | Freq: Every day | ORAL | 3 refills | Status: DC

## 2021-07-29 ENCOUNTER — Encounter: Payer: Self-pay | Admitting: Neurology

## 2021-07-31 ENCOUNTER — Encounter (HOSPITAL_COMMUNITY): Payer: Self-pay

## 2021-08-08 ENCOUNTER — Encounter: Payer: Self-pay | Admitting: Neurology

## 2021-08-08 ENCOUNTER — Encounter: Payer: Self-pay | Admitting: Hematology and Oncology

## 2021-08-09 ENCOUNTER — Ambulatory Visit (AMBULATORY_SURGERY_CENTER): Payer: Commercial Managed Care - PPO | Admitting: *Deleted

## 2021-08-09 VITALS — Ht 66.0 in | Wt 177.0 lb

## 2021-08-09 DIAGNOSIS — Z1211 Encounter for screening for malignant neoplasm of colon: Secondary | ICD-10-CM

## 2021-08-09 MED ORDER — NA SULFATE-K SULFATE-MG SULF 17.5-3.13-1.6 GM/177ML PO SOLN: 1.0000 | Freq: Once | ORAL | 0 refills | Status: AC

## 2021-08-09 NOTE — Progress Notes (Signed)
No egg or soy allergy known to patient  ? issues known to pt with past sedation with any surgeries or procedures of PONV  ?Patient denies ever being told they had issues or difficulty with intubation  ?No FH of Malignant Hyperthermia ?Pt is not on diet pills ?Pt is not on  home 02  ?Pt is not on blood thinners  ?Pt denies issues with constipation  ?No A fib or A flutter ? ?NO PA's for preps discussed with pt In PV today  ?Discussed with pt there will be an out-of-pocket cost for prep and that varies from $0 to 70 +  dollars - pt verbalized understanding  ?Pt instructed to use Singlecare.com or GoodRx for a price reduction on prep  ? ?PV completed over the phone. Pt verified name, DOB, address and insurance during PV today.  ?Pt mailed instruction packet with copy of consent form to read and not return, and instructions.  ?Pt encouraged to call with questions or issues.  ?If pt has My chart, procedure instructions sent via My Chart  ?Insurance confirmed with pt at [facility] today  ? ?

## 2021-08-15 ENCOUNTER — Encounter (INDEPENDENT_AMBULATORY_CARE_PROVIDER_SITE_OTHER): Payer: Commercial Managed Care - PPO | Admitting: Internal Medicine

## 2021-08-15 ENCOUNTER — Other Ambulatory Visit: Payer: Self-pay

## 2021-08-15 ENCOUNTER — Encounter: Payer: Self-pay | Admitting: Rehabilitation

## 2021-08-15 DIAGNOSIS — E039 Hypothyroidism, unspecified: Secondary | ICD-10-CM

## 2021-08-15 DIAGNOSIS — Z170 Estrogen receptor positive status [ER+]: Secondary | ICD-10-CM

## 2021-08-16 NOTE — Telephone Encounter (Signed)
Please see the MyChart message reply(ies) for my assessment and plan.  ?  ?This patient gave consent for this Medical Advice Message and is aware that it may result in a bill to [employer], as well as the possibility of receiving a bill for a co-payment or deductible. They are an established patient, but are not seeking medical advice exclusively about a problem treated during an in person or video visit in the last seven days. I did not recommend an in person or video visit within seven days of my reply.  ?  ?I spent a total of 10 minutes cumulative time within 7 days through [employer]. ? ?Philemon Kingdom, MD   ?

## 2021-08-19 ENCOUNTER — Ambulatory Visit: Payer: Commercial Managed Care - PPO | Attending: Adult Health | Admitting: Rehabilitation

## 2021-08-19 ENCOUNTER — Encounter: Payer: Self-pay | Admitting: Rehabilitation

## 2021-08-19 DIAGNOSIS — R600 Localized edema: Secondary | ICD-10-CM | POA: Diagnosis not present

## 2021-08-19 DIAGNOSIS — I890 Lymphedema, not elsewhere classified: Secondary | ICD-10-CM | POA: Diagnosis not present

## 2021-08-19 DIAGNOSIS — M25612 Stiffness of left shoulder, not elsewhere classified: Secondary | ICD-10-CM | POA: Insufficient documentation

## 2021-08-19 DIAGNOSIS — C50312 Malignant neoplasm of lower-inner quadrant of left female breast: Secondary | ICD-10-CM | POA: Insufficient documentation

## 2021-08-19 DIAGNOSIS — Z170 Estrogen receptor positive status [ER+]: Secondary | ICD-10-CM | POA: Insufficient documentation

## 2021-08-19 DIAGNOSIS — R293 Abnormal posture: Secondary | ICD-10-CM | POA: Diagnosis not present

## 2021-08-19 DIAGNOSIS — Z483 Aftercare following surgery for neoplasm: Secondary | ICD-10-CM | POA: Insufficient documentation

## 2021-08-19 NOTE — Therapy (Signed)
[facility] ?[facility] ?[place], [place], [address] ?Phone: [phone]   Fax:  [phone] ? ?Physical Therapy Evaluation ? ?Patient Details  ?Name: Valerie [name] ?MRN: [id number] ?Date of Birth: 21-Jun-1970 ?Referring Provider (PT): Dr. Erroll Luna ? ? ?Encounter Date: 08/19/2021 ? ? PT End of Session - 08/19/21 1208   ? ? Visit Number 10   ? Number of Visits 18   ? Date for PT Re-Evaluation 09/30/21   ? PT Start Time 1102   ? PT Stop Time 1200   ? PT Time Calculation (min) 58 min   ? Activity Tolerance Patient tolerated treatment well   ? Behavior During Therapy [facility] for tasks assessed/performed   ? ?  ?  ? ?  ? ? ?Past Medical History:  ?Diagnosis Date  ? Cancer ([place]) 09/2020  ? left breast  ? GERD (gastroesophageal reflux disease)   ? due to prednisone  ? Hashimoto's disease   ? History of radiation therapy 2022  ? completed 01-18-2021  ? Mitral regurgitation   ? mild by echo 06/2020  ? 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 Optic neuritis   ? PONV (postoperative nausea and vomiting)   ? ? ?Past Surgical History:  ?Procedure Laterality Date  ? BIOPSY BREAST  10/17/2020  ? BREAST LUMPECTOMY WITH RADIOACTIVE SEED AND SENTINEL LYMPH NODE BIOPSY Left 11/15/2020  ? Procedure: LEFT BREAST LUMPECTOMY WITH RADIOACTIVE SEED AND LEFT SENTINEL LYMPH NODE BIOPSY;  Surgeon: Erroll Luna, MD;  Location: [facility];  Service: General;  Laterality: Left;  ? TUBAL LIGATION  02/2012  ? WISDOM TOOTH EXTRACTION    ? ? ?There were no vitals filed for this visit. ? ? ? Subjective Assessment - 08/19/21 1103   ? ? Subjective I haven't had to wear my sleeve for at least the past 6 months and everything was pretty normal.  Then this past thursday it was swollen in my hand.  I put on my sleeve and handpiece and did MLD.  I have been trying to wear it now.  It maybe had to do with my prednisone decrease?   ? Pertinent History  She had left lumpectomy on 11/15/2020 with 0/3 lymph nodes removed. She says that she has a problem with passing out with medical things but can let us know if she feels this might happen.   ? Currently in Pain? No/denies   ? ?  ?  ? ?  ? ? ? ? ? [facility] PT Assessment - 08/19/21 0001   ? ?  ? Assessment  ? Medical Diagnosis Left breast cancer   ? Referring Provider (PT) Dr. Erroll Luna   ? Onset Date/Surgical Date 09/11/20   ? Hand Dominance Right   ?  ? Observation/Other Assessments  ? Observations rippled/textured appearing skin Left medial breast most  likely edema. still using compression bra and has foam circle pad from before.   ?  ? Palpation  ? Palpation comment tightness Left pectoralis especially in abduction and Y position. Fibrosis medial breast   ? ?  ?  ? ?  ? ? ? ? LYMPHEDEMA/ONCOLOGY QUESTIONNAIRE - 08/19/21 0001   ? ?  ? Right Upper Extremity Lymphedema  ? At Axilla  31.5 cm   ? 15 cm Proximal to Olecranon Process 32.5 cm   ? 10 cm Proximal to Olecranon Process 32.5 cm   ? Olecranon Process 25 cm   ? 15  cm Proximal to Ulnar Styloid Process 23.7 cm   ? 10 cm Proximal to Ulnar Styloid Process 20.7 cm   ? Just Proximal to Ulnar Styloid Process 14.5 cm   ? Across Hand at [employer] 17.5 cm   ? At [facility] of 2nd Digit 6.4 cm   ?  ? Left Upper Extremity Lymphedema  ? At Axilla  31.5 cm   ? 15 cm Proximal to Olecranon Process 32 cm   ? 10 cm Proximal to Olecranon Process 32 cm   ? Olecranon Process 27 cm   ? 15 cm Proximal to Ulnar Styloid Process 24.3 cm   ? 10 cm Proximal to Ulnar Styloid Process 22 cm   ? Just Proximal to Ulnar Styloid Process 15.5 cm   ? Across Hand at [employer] 18.3 cm   ? At [facility] of 2nd Digit 6.5 cm   ? ?  ?  ? ?  ? ? L-DEX FLOWSHEETS - 08/19/21 1100   ? ?  ? L-DEX LYMPHEDEMA SCREENING  ? Measurement Type Unilateral   ? L-DEX MEASUREMENT EXTREMITY Upper Extremity   ? POSITION  Standing   ? DOMINANT SIDE Right   ? At Risk Side Left   ? BASELINE SCORE (UNILATERAL) 2.5   ? L-DEX  SCORE (UNILATERAL) 16.8   ? VALUE CHANGE (UNILAT) 14.3   ? ?  ?  ? ?  ? ? ? ? ? ? ? Valerie [name] - 08/19/21 0001   ? ? Open a tight or new jar Mild difficulty   ? Do heavy household chores (wash walls, wash floors) Mild difficulty   ? Carry a shopping bag or briefcase No difficulty   ? Wash your back No difficulty   ? Use a knife to cut food No difficulty   ? Recreational activities in which you take some force or impact through your arm, shoulder, or hand (golf, hammering, tennis) Mild difficulty   ? During the past week, to what extent has your arm, shoulder or hand problem interfered with your normal social activities with family, friends, neighbors, or groups? Slightly   ? During the past week, to what extent has your arm, shoulder or hand problem limited your work or other regular daily activities Slightly   ? Arm, shoulder, or hand pain. None   ? Tingling (pins and needles) in your arm, shoulder, or hand Moderate   ? Difficulty Sleeping No difficulty   ? DASH Score 15.91 %   ? ?  ?  ? ?  ? ? ? ?Objective measurements completed on examination: See above findings.  ? ? ? ? ? [facility] Adult PT Treatment/Exercise - 08/19/21 0001   ? ?  ? Self-Care  ? Other Self-Care Comments  Discussed current sleeve, increasing to class 2 circular knit or class 1 flat knit now that lymphedema seems to be more chronic in nature.  Educated pt briefly on self bandaging with link to MD [facility] caregiver bandaging your arm video as her husband will most likely be the one to bandage.  Applied bandage with vcs and demo.  Instructed pt to have husband try a few times today and not to sleep in it if it uncomfortable.  Plan will be for night bandaging, day time compression sleeve use   ? ?  ?  ? ?  ? ? ? ? ? ? ? ? ? ? PT Education - 08/19/21 1220   ? ? Education Details self bandaging   ?  Person(s) Educated Patient   ? Methods Explanation;Demonstration;Tactile cues;Verbal cues;Handout   ? Comprehension Verbalized understanding;Verbal cues  required;Tactile cues required;Need further instruction   ? ?  ?  ? ?  ? ? ? ? ? ? PT Long Term Goals - 08/19/21 1224   ? ?  ? PT LONG TERM GOAL #1  ? Title pt will be ind with stretches for pectoralis and chest stretching   ? Time 6   ? Period Weeks   ? Status New   ?  ? PT LONG TERM GOAL #2  ? Title Pt and husband will be ind with self bandaging and MLD of the Lt UE   ? Time 6   ? Period Weeks   ? Status New   ?  ? PT LONG TERM GOAL #3  ? Title Pt will obtain new compression garments for chronic lymphedema   ? Time 6   ? Period Weeks   ? Status New   ? ?  ?  ? ?  ? ? ? ? ? ? ? ? ? Plan - 08/19/21 1221   ? ? Clinical Impression Statement Pt returns after SOZO return to green zone and no signs of swelling and not using compression x 6 months.  SOZO has not increased into the red zone and pt has visible edema in the hand and forearm.  We will initiate educated on night bandaging and switch compression to class 2 circular knit vs class 1 flat knit and switch to glove use inseated of gauntlet.  We will also continue Lt UE and breast MLD and STM/PROM for pectoralis tightness.   ? Personal Factors and Comorbidities Past/Current Experience;Comorbidity 3+   ? Comorbidities autoimmune condition, SLNBradiation,   ? Examination-Activity Limitations Lift   ? Stability/Clinical Decision Making Evolving/Moderate complexity   ? Clinical Decision Making Moderate   ? Rehab Potential Excellent   ? PT Frequency 2x / week   ? PT Duration 6 weeks   ? PT Treatment/Interventions ADLs/Self Care Home Management;Therapeutic exercise;Patient/family education;Manual lymph drainage;Compression bandaging;Manual techniques;Passive range of motion   ? PT Next Visit Plan how was self bandaging? Give bandaging care instruction sheet, review bandaging as needed, perform Lt UE and breast MLD with review of all, demo back for flexi or sunmed?  Eventually garment swtich to class 2 circular or class 1 flat knit.   ? Consulted and Agree with Plan of Care  Patient   ? ?  ?  ? ?  ? ? ?Patient will benefit from skilled therapeutic intervention in order to improve the following deficits and impairments:  Postural dysfunction, Decreased range of motion, Decreas

## 2021-08-22 ENCOUNTER — Ambulatory Visit: Payer: Commercial Managed Care - PPO

## 2021-08-22 DIAGNOSIS — Z483 Aftercare following surgery for neoplasm: Secondary | ICD-10-CM

## 2021-08-22 DIAGNOSIS — M25612 Stiffness of left shoulder, not elsewhere classified: Secondary | ICD-10-CM

## 2021-08-22 DIAGNOSIS — R293 Abnormal posture: Secondary | ICD-10-CM

## 2021-08-22 DIAGNOSIS — R600 Localized edema: Secondary | ICD-10-CM

## 2021-08-22 DIAGNOSIS — Z170 Estrogen receptor positive status [ER+]: Secondary | ICD-10-CM

## 2021-08-22 DIAGNOSIS — C50312 Malignant neoplasm of lower-inner quadrant of left female breast: Secondary | ICD-10-CM | POA: Diagnosis not present

## 2021-08-22 NOTE — Therapy (Signed)
[facility] ?[facility] ?[place], [place], [address] ?Phone: [phone]   Fax:  [phone] ? ?Physical Therapy Treatment ? ?Patient Details  ?Name: Valerie [name] ?MRN: [id number] ?Date of Birth: 25-Aug-1970 ?Referring Provider (PT): Dr. Erroll Luna ? ? ?Encounter Date: 08/22/2021 ? ? PT End of Session - 08/22/21 0858   ? ? Visit Number 11   ? Number of Visits 18   ? Date for PT Re-Evaluation 09/30/21   ? PT Start Time 0802   ? PT Stop Time 0858   ? PT Time Calculation (min) 56 min   ? Activity Tolerance Patient tolerated treatment well   ? Behavior During Therapy [facility] for tasks assessed/performed   ? ?  ?  ? ?  ? ? ?Past Medical History:  ?Diagnosis Date  ? Cancer ([place]) 09/2020  ? left breast  ? GERD (gastroesophageal reflux disease)   ? due to prednisone  ? Hashimoto's disease   ? History of radiation therapy 2022  ? completed 01-18-2021  ? Mitral regurgitation   ? mild by echo 06/2020  ? 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 Optic neuritis   ? PONV (postoperative nausea and vomiting)   ? ? ?Past Surgical History:  ?Procedure Laterality Date  ? BIOPSY BREAST  10/17/2020  ? BREAST LUMPECTOMY WITH RADIOACTIVE SEED AND SENTINEL LYMPH NODE BIOPSY Left 11/15/2020  ? Procedure: LEFT BREAST LUMPECTOMY WITH RADIOACTIVE SEED AND LEFT SENTINEL LYMPH NODE BIOPSY;  Surgeon: Erroll Luna, MD;  Location: [facility];  Service: General;  Laterality: Left;  ? TUBAL LIGATION  02/2012  ? WISDOM TOOTH EXTRACTION    ? ? ?There were no vitals filed for this visit. ? ? Subjective Assessment - 08/22/21 0805   ? ? Subjective I feel like I have the sequencing down with the bandaging, but I am really having trouble sleeping in it. I can tell my UE reduces after I take it off though. I didn't bring the bandages today though bc I only wear them at night.   ? Pertinent History She had left lumpectomy on 11/15/2020 with 0/3 lymph  nodes removed. She says that she has a problem with passing out with medical things but can let us know if she feels this might happen.   ? Patient Stated Goals get rid of the arm discomfort   ? Currently in Pain? No/denies   ? ?  ?  ? ?  ? ? ? ? ? ? ? ? ? ? ? ? ? ? ? ? ? ? ? ? [place] [facility] Adult PT Treatment/Exercise - 08/22/21 0001   ? ?  ? Manual Therapy  ? Edema Management Issued 1/2" gray compression foam for pt to add to dorsal hand, and then issued small dotted peach foam and chip pack for to try one at a time to wear at her medial breast in compression bra   ? Manual Lymphatic Drainage (MLD) Supraclavicular,5 diaphragmatic breaths, Rt axillary and Left inguinal LN's, anterior interaxillary pathway, and left axillo-inguinal pathway, then left UE working from proximal to distal and briefly including medial aspect of breast, then retracing all steps and reviewing with pt while performing.   ? Passive ROM Left shoulder flexion, abd, D2 to help improve ROM and decrease tightness   ? ?  ?  ? ?  ? ? ? ? ? ? ? ? ? ? ? ? ? ? ? PT Long Term Goals -  08/19/21 1224   ? ?  ? PT LONG TERM GOAL #1  ? Title pt will be ind with stretches for pectoralis and chest stretching   ? Time 6   ? Period Weeks   ? Status New   ?  ? PT LONG TERM GOAL #2  ? Title Pt and husband will be ind with self bandaging and MLD of the Lt UE   ? Time 6   ? Period Weeks   ? Status New   ?  ? PT LONG TERM GOAL #3  ? Title Pt will obtain new compression garments for chronic lymphedema   ? Time 6   ? Period Weeks   ? Status New   ? ?  ?  ? ?  ? ? ? ? ? ? ? ? Plan - 08/22/21 0859   ? ? Clinical Impression Statement Continued with MLD to Lt UE and included breast sequence per pt request, revieing all with her while performing. Pt did not bring bandages so could not review this with her but did verbally review way to apply correct compression and how bandages should feel once finished. Issued new compression foam, see flowsheet. Pt is bringing her husband to next  appt for review of MLD and bandaging.   ? Personal Factors and Comorbidities Past/Current Experience;Comorbidity 3+   ? Comorbidities autoimmune condition, SLNBradiation,   ? Examination-Activity Limitations Lift   ? Stability/Clinical Decision Making Evolving/Moderate complexity   ? Rehab Potential Excellent   ? PT Frequency 2x / week   ? PT Duration 6 weeks   ? PT Treatment/Interventions ADLs/Self Care Home Management;Therapeutic exercise;Patient/family education;Manual lymph drainage;Compression bandaging;Manual techniques;Passive range of motion   ? PT Next Visit Plan how was self bandaging? Give bandaging care instruction sheet, review bandaging as needed, perform Lt UE and breast MLD with review of all, demo back for flexi or sunmed?  Eventually garment swtich to class 2 circular or class 1 flat knit.   ? PT Home Exercise Plan Post op shoulder ROM HEP, dowel exercises; wear compression sleeve and gauntlet daily for 1 month   ? Consulted and Agree with Plan of Care Patient   ? ?  ?  ? ?  ? ? ?Patient will benefit from skilled therapeutic intervention in order to improve the following deficits and impairments:  Postural dysfunction, Decreased range of motion, Decreased knowledge of precautions, Impaired UE functional use, Increased edema ? ?Visit Diagnosis: ?Aftercare following surgery for neoplasm ? ?Malignant neoplasm of lower-inner quadrant of left breast in female, estrogen receptor positive ([place]) ? ?Localized edema ? ?Abnormal posture ? ?Stiffness of left shoulder, not elsewhere classified ? ? ? ? ?Problem List ?Patient Active Problem List  ? Diagnosis Date Noted  ? Hypothyroidism due to Hashimoto's thyroiditis 06/07/2021  ? Malignant neoplasm of lower-inner quadrant of left breast in female, estrogen receptor positive ([place]) 10/23/2020  ? Optic neuritis, left 10/18/2020  ? 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 Mitral regurgitation   ? High risk medication use  05/07/2020  ? 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 Hypothyroid 04/25/2020  ? Optic neuritis, right 04/24/2020  ? ? ?Otelia Limes, PTA ?08/22/2021, 9:02 AM ? ?[facility] ?[facility] ?[place], [place], [address] ?Phone: [phone]   Fax:  [phone] ? ?Name: Valerie [name] ?MRN: [id number] ?Date of Birth: 03-05-1971 ? ? ? ?

## 2021-08-26 ENCOUNTER — Ambulatory Visit: Payer: Commercial Managed Care - PPO

## 2021-08-26 DIAGNOSIS — Z483 Aftercare following surgery for neoplasm: Secondary | ICD-10-CM

## 2021-08-26 DIAGNOSIS — C50312 Malignant neoplasm of lower-inner quadrant of left female breast: Secondary | ICD-10-CM | POA: Diagnosis not present

## 2021-08-26 DIAGNOSIS — Z170 Estrogen receptor positive status [ER+]: Secondary | ICD-10-CM

## 2021-08-26 DIAGNOSIS — R293 Abnormal posture: Secondary | ICD-10-CM

## 2021-08-26 DIAGNOSIS — R600 Localized edema: Secondary | ICD-10-CM

## 2021-08-26 DIAGNOSIS — M25612 Stiffness of left shoulder, not elsewhere classified: Secondary | ICD-10-CM

## 2021-08-26 NOTE — Therapy (Signed)
?[facility] ?[place], [place], [address] ?Phone: [phone]   Fax:  [phone] ? ?Physical Therapy Treatment ? ?Patient Details  ?Name: Valerie [name] ?MRN: [id number] ?Date of Birth: Sep 25, 1970 ?Referring Provider (PT): Dr. Erroll Luna ? ? ?Encounter Date: 08/26/2021 ? ? PT End of Session - 08/26/21 1703   ? ? Visit Number 12   ? Number of Visits 18   ? Date for PT Re-Evaluation 09/30/21   ? PT Start Time 1550   ? PT Stop Time 0814   ? PT Time Calculation (min) 68 min   ? Activity Tolerance Patient tolerated treatment well   ? Behavior During Therapy [facility] for tasks assessed/performed   ? ?  ?  ? ?  ? ? ?Past Medical History:  ?Diagnosis Date  ? Cancer ([place]) 09/2020  ? left breast  ? GERD (gastroesophageal reflux disease)   ? due to prednisone  ? Hashimoto's disease   ? History of radiation therapy 2022  ? completed 01-18-2021  ? Mitral regurgitation   ? mild by echo 06/2020  ? 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 Optic neuritis   ? PONV (postoperative nausea and vomiting)   ? ? ?Past Surgical History:  ?Procedure Laterality Date  ? BIOPSY BREAST  10/17/2020  ? BREAST LUMPECTOMY WITH RADIOACTIVE SEED AND SENTINEL LYMPH NODE BIOPSY Left 11/15/2020  ? Procedure: LEFT BREAST LUMPECTOMY WITH RADIOACTIVE SEED AND LEFT SENTINEL LYMPH NODE BIOPSY;  Surgeon: Erroll Luna, MD;  Location: [facility];  Service: General;  Laterality: Left;  ? TUBAL LIGATION  02/2012  ? WISDOM TOOTH EXTRACTION    ? ? ?There were no vitals filed for this visit. ? ? Subjective Assessment - 08/26/21 1706   ? ? Subjective I brought my husband for review. The foam you gave me for my hand and to wewar in bra at my breast have both really helped.   ? Pertinent History She had left lumpectomy on 11/15/2020 with 0/3 lymph nodes removed. She says that she has a problem with passing out with medical things but can let us know if  she feels this might happen.   ? Patient Stated Goals get rid of the arm discomfort   ? Currently in Pain? No/denies   ? ?  ?  ? ?  ? ? ? ? ? ? ? ? ? ? ? ? ? ? ? ? ? ? ? ? [facility] Adult PT Treatment/Exercise - 08/26/21 0001   ? ?  ? Manual Therapy  ? Manual Therapy Manual Lymphatic Drainage (MLD);Compression Bandaging   ? Manual Lymphatic Drainage (MLD) Supraclavicular,5 diaphragmatic breaths, Rt axillary and Left inguinal LN's, anterior interaxillary pathway, and left axillo-inguinal pathway, then left UE working from proximal to distal and briefly including medial aspect of breast, then retracing all steps, had pts husband return steps to assess his technique. Hand over hand technique used to demonstrate proper direction of stationary circle and pump, and correct skin stretch, not slide. He was then able to return correct demo.   ? Compression Bandaging First had pts husband perform as follows: Elastomull to fingers 1-4, artiflex with 1/2" gray foam to dorsal hand, artiflex, x1, then 1-6 cm and 2-10 cm short stretch compression banadages; then repeated same bandages to show increased tension as hand bandage was too loose and pt has noticed increased swelling across knuckles in morning when removing bandages   ? ?  ?  ? ?  ? ? ? ? ? ? ? ? ? ? ? ? ? ? ?  PT Long Term Goals - 08/19/21 1224   ? ?  ? PT LONG TERM GOAL #1  ? Title pt will be ind with stretches for pectoralis and chest stretching   ? Time 6   ? Period Weeks   ? Status New   ?  ? PT LONG TERM GOAL #2  ? Title Pt and husband will be ind with self bandaging and MLD of the Lt UE   ? Time 6   ? Period Weeks   ? Status New   ?  ? PT LONG TERM GOAL #3  ? Title Pt will obtain new compression garments for chronic lymphedema   ? Time 6   ? Period Weeks   ? Status New   ? ?  ?  ? ?  ? ? ? ? ? ? ? ? Plan - 08/26/21 1704   ? ? Clinical Impression Statement Pts husband present for session today. Reviewed compression bandaging and manual lymph draiange of Lt UE. See  flowsheet for cuing and instruction provided to husband and pt. Both reported feeling better about technique after session today and he was able to return much improved demo of MLD by end of session as well.   ? Personal Factors and Comorbidities Past/Current Experience;Comorbidity 3+   ? Comorbidities autoimmune condition, SLNBradiation,   ? Examination-Activity Limitations Lift   ? Stability/Clinical Decision Making Evolving/Moderate complexity   ? Rehab Potential Excellent   ? PT Frequency 2x / week   ? PT Duration 6 weeks   ? PT Treatment/Interventions ADLs/Self Care Home Management;Therapeutic exercise;Patient/family education;Manual lymph drainage;Compression bandaging;Manual techniques;Passive range of motion   ? PT Next Visit Plan Give bandaging care instruction sheet, perform Lt UE and breast MLD with review of all, demo back for flexi or sunmed?  Eventually garment swtich to class 1 flat knit.   ? PT Home Exercise Plan Post op shoulder ROM HEP, dowel exercises; wear compression sleeve and gauntlet daily for 1 month   ? Consulted and Agree with Plan of Care Patient   ? ?  ?  ? ?  ? ? ?Patient will benefit from skilled therapeutic intervention in order to improve the following deficits and impairments:  Postural dysfunction, Decreased range of motion, Decreased knowledge of precautions, Impaired UE functional use, Increased edema ? ?Visit Diagnosis: ?Aftercare following surgery for neoplasm ? ?Malignant neoplasm of lower-inner quadrant of left breast in female, estrogen receptor positive ([place]) ? ?Localized edema ? ?Abnormal posture ? ?Stiffness of left shoulder, not elsewhere classified ? ? ? ? ?Problem List ?Patient Active Problem List  ? Diagnosis Date Noted  ? Hypothyroidism due to Hashimoto's thyroiditis 06/07/2021  ? Malignant neoplasm of lower-inner quadrant of left breast in female, estrogen receptor positive ([place]) 10/23/2020  ? Optic neuritis, left 10/18/2020  ? Myelin oligodendrocyte glycoprotein  antibody disorder (MOGAD) ([place]) 07/10/2020  ? Mitral regurgitation   ? High risk medication use 05/07/2020  ? 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 Hypothyroid 04/25/2020  ? Optic neuritis, right 04/24/2020  ? ? ?Otelia Limes, PTA ?08/26/2021, 5:07 PM ? ?[facility] ?[facility] ?[place], [place], [address] ?Phone: [phone]   Fax:  [phone] ? ?Name: Valerie [name] ?MRN: [id number] ?Date of Birth: 17-Nov-1970 ? ? ? ?

## 2021-08-29 ENCOUNTER — Encounter: Payer: Self-pay | Admitting: Internal Medicine

## 2021-08-29 ENCOUNTER — Other Ambulatory Visit (INDEPENDENT_AMBULATORY_CARE_PROVIDER_SITE_OTHER): Payer: Commercial Managed Care - PPO

## 2021-08-29 DIAGNOSIS — E038 Other specified hypothyroidism: Secondary | ICD-10-CM

## 2021-08-29 DIAGNOSIS — E063 Autoimmune thyroiditis: Secondary | ICD-10-CM | POA: Diagnosis not present

## 2021-08-29 LAB — TSH: TSH: 0.08 u[IU]/mL — ABNORMAL LOW (ref 0.35–5.50)

## 2021-08-29 LAB — T4, FREE: Free T4: 1.2 ng/dL (ref 0.60–1.60)

## 2021-08-29 MED ORDER — LEVOTHYROXINE SODIUM 88 MCG PO TABS: 88.0000 ug | ORAL_TABLET | Freq: Every day | ORAL | 3 refills | Status: DC

## 2021-08-30 ENCOUNTER — Ambulatory Visit: Payer: Commercial Managed Care - PPO | Admitting: Rehabilitation

## 2021-08-30 ENCOUNTER — Encounter: Payer: Self-pay | Admitting: Rehabilitation

## 2021-08-30 DIAGNOSIS — R293 Abnormal posture: Secondary | ICD-10-CM

## 2021-08-30 DIAGNOSIS — Z170 Estrogen receptor positive status [ER+]: Secondary | ICD-10-CM

## 2021-08-30 DIAGNOSIS — Z483 Aftercare following surgery for neoplasm: Secondary | ICD-10-CM

## 2021-08-30 DIAGNOSIS — M25612 Stiffness of left shoulder, not elsewhere classified: Secondary | ICD-10-CM

## 2021-08-30 DIAGNOSIS — R600 Localized edema: Secondary | ICD-10-CM

## 2021-08-30 DIAGNOSIS — C50312 Malignant neoplasm of lower-inner quadrant of left female breast: Secondary | ICD-10-CM | POA: Diagnosis not present

## 2021-08-30 MED ORDER — LEVOTHYROXINE SODIUM 88 MCG PO TABS: 88.0000 ug | ORAL_TABLET | Freq: Every day | ORAL | 1 refills | Status: DC

## 2021-08-30 NOTE — Therapy (Signed)
[facility] @ [facility] [place], [place], [address] Phone: [phone]   Fax:  [phone]  Physical Therapy Treatment  Patient Details  Name: Valerie [name] MRN: [id number] Date of Birth: 1970/10/05 Referring Provider (PT): Dr. Erroll Luna   Encounter Date: 08/30/2021   PT End of Session - 08/30/21 1107     Visit Number 13    Number of Visits 18    Date for PT Re-Evaluation 09/30/21    PT Start Time 0804    PT Stop Time 0902    PT Time Calculation (min) 58 min    Activity Tolerance Patient tolerated treatment well    Behavior During Therapy 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   Subjective Assessment - 08/30/21 0804     Subjective nothing new. I am getting better by myself doing the bandage    Pertinent History She had left lumpectomy on 11/15/2020 with 0/3 lymph nodes removed. She says that she has a problem with passing out with medical things but can let us know if she feels this might happen.    Currently in Pain? No/denies                                [facility] Adult PT Treatment/Exercise - 08/30/21 0001       Manual Therapy   Edema Management measured pt for size 1 long medi mondi and size 2 glove in class 1 and size 3 long circaid profile with order sent to [facility] with demo today    Manual Lymphatic Drainage (MLD) Supraclavicular,5 diaphragmatic breaths, bil axillary and Left inguinal LN's, anterior interaxillary pathway, and left axillo-inguinal pathway, then left UE working from proximal to distal, then retracing all steps    Compression Bandaging pt performed hand and finger bandage with cueing to keep hand flat for fingers to not bandage too tight at the wrist and to not overalp the hand bandage at the wrist so many times to try and prevent that shelf from occuring at the forearm                          PT Long Term Goals - 08/19/21 1224       PT LONG TERM GOAL #1   Title pt will be ind with stretches for pectoralis and chest stretching    Time 6  Period Weeks    Status New      PT LONG TERM GOAL #2   Title Pt and husband will be ind with self bandaging and MLD of the Lt UE    Time 6    Period Weeks    Status New      PT LONG TERM GOAL #3   Title Pt will obtain new compression garments for chronic lymphedema    Time 6    Period Weeks    Status New                   Plan - 08/30/21 1111     Clinical Impression Statement Pt is wrapping very well but needing some cueing for changes to the hand part to decrease tightness.  Measured for new garments and performed MLD as pt will not have time for this today.  Will also send demo to [facility].    PT Frequency 2x / week    PT Duration 6 weeks    PT Treatment/Interventions ADLs/Self Care Home Management;Therapeutic exercise;Patient/family education;Manual lymph drainage;Compression bandaging;Manual techniques;Passive range of motion    PT Next Visit Plan Give bandaging care instruction sheet, perform  Lt UE and breast MLD with review of all, demo back for flexi?    Consulted and Agree with Plan of Care Patient             Patient will benefit from skilled therapeutic intervention in order to improve the following deficits and impairments:     Visit Diagnosis: Aftercare following surgery for neoplasm  Malignant neoplasm of lower-inner quadrant of left breast in female, estrogen receptor positive ([place])  Localized edema  Abnormal posture  Stiffness of left shoulder, not elsewhere classified     Problem List Patient Active Problem List   Diagnosis Date Noted   Hypothyroidism due to Hashimoto's thyroiditis 06/07/2021   Malignant neoplasm of lower-inner quadrant of left breast in female, estrogen receptor positive ([place]) 10/23/2020   Optic neuritis, left 10/18/2020   Myelin oligodendrocyte glycoprotein antibody disorder (MOGAD) ([place]) 07/10/2020   Mitral regurgitation    High risk medication use 05/07/2020   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

## 2021-09-03 ENCOUNTER — Ambulatory Visit: Payer: Commercial Managed Care - PPO | Admitting: Physical Therapy

## 2021-09-03 DIAGNOSIS — C50312 Malignant neoplasm of lower-inner quadrant of left female breast: Secondary | ICD-10-CM

## 2021-09-03 DIAGNOSIS — R600 Localized edema: Secondary | ICD-10-CM

## 2021-09-03 DIAGNOSIS — Z483 Aftercare following surgery for neoplasm: Secondary | ICD-10-CM

## 2021-09-03 DIAGNOSIS — I890 Lymphedema, not elsewhere classified: Secondary | ICD-10-CM

## 2021-09-03 NOTE — Patient Instructions (Signed)

## 2021-09-03 NOTE — Therapy (Signed)
[place] @ [facility] [address] [place], [place], [address] Phone: [phone]   Fax:  [phone]  Physical Therapy Treatment  Patient Details  Name: Valerie [name] MRN: [id number] Date of Birth: [birth year] Referring Provider (PT): Dr. Erroll Luna   Encounter Date: 09/03/2021   PT End of Session - 09/03/21 1237     Visit Number 14    Number of Visits 18    Date for PT Re-Evaluation 09/30/21    PT Start Time 0800    PT Stop Time 0900    PT Time Calculation (min) 60 min    Activity Tolerance Patient tolerated treatment well    Behavior During Therapy [facility] for tasks assessed/performed             Past Medical History:  Diagnosis Date   Cancer ([place]) 09/2020   left breast   GERD (gastroesophageal reflux disease)    due to prednisone   Hashimoto's disease    History of radiation therapy 2022   completed 01-18-2021   Mitral regurgitation    mild by echo 06/2020   Myelin oligodendrocyte glycoprotein antibody disorder (MOGAD) ([place])    Optic neuritis    PONV (postoperative nausea and vomiting)     Past Surgical History:  Procedure Laterality Date   BIOPSY BREAST  10/17/2020   BREAST LUMPECTOMY WITH RADIOACTIVE SEED AND SENTINEL LYMPH NODE BIOPSY Left 11/15/2020   Procedure: LEFT BREAST LUMPECTOMY WITH RADIOACTIVE SEED AND LEFT SENTINEL LYMPH NODE BIOPSY;  Surgeon: Erroll Luna, MD;  Location: [facility];  Service: General;  Laterality: Left;   TUBAL LIGATION  02/2012   WISDOM TOOTH EXTRACTION      There were no vitals filed for this visit.   Subjective Assessment - 09/03/21 0807     Subjective Pt says she is doing well with her bandaging and and heard from [facility] and her garments should should ship out today, She was given the codes for lymphedema and her breast cancer to give to the insurance to see if the Flexitouch will be covered.               [facility] Adult PT Treatment/Exercise -  08/2321 0001                Manual Therapy         Manual Lymphatic Drainage (MLD) Supraclavicular,5 diaphragmatic breaths, bil axillary and Left inguinal LN's, anterior interaxillary pathway, and left axillo-inguinal pathway, then left UE working from proximal to distal, then retracing all steps     Compression Bandaging Gave pt information about bandage care                                              PT Long Term Goals - 08/19/21 1224       PT LONG TERM GOAL #1   Title pt will be ind with stretches for pectoralis and chest stretching    Time 6    Period Weeks    Status New      PT LONG TERM GOAL #2   Title Pt and husband will be ind with self bandaging and MLD of the Lt UE    Time 6    Period Weeks    Status New      PT LONG TERM GOAL #3   Title Pt will obtain  new compression garments for chronic lymphedema    Time 6    Period Weeks    Status New              Plan - 09/03/21       Clinical Impression Statement Pt reports she is wrapping well, thought she still has a "ledge" at forearm just proximal to wrist. Pt says she is working on this with bandaging and extra time was spent on this area with MLD today.  Pt was give the card for Flexitouch rep and she will call to follow up     PT Frequency 2x / week     PT Duration 6 weeks     PT Treatment/Interventions ADLs/Self Care Home Management;Therapeutic exercise;Patient/family education;Manual lymph drainage;Compression bandaging;Manual techniques;Passive range of motion     PT Next Visit Plan perform Lt UE and breast MLD with review of all, heard back for flexi?     Consulted and Agree with Plan of Care Patient           Patient will benefit from skilled therapeutic intervention in order to improve the following deficits and impairments:     Visit Diagnosis: Aftercare following surgery for neoplasm  Localized edema  Malignant neoplasm of lower-inner quadrant of left  breast in female, estrogen receptor positive ([place])  Lymphedema, not elsewhere classified     Problem List Patient Active Problem List   Diagnosis Date Noted   Hypothyroidism due to Hashimoto's thyroiditis 06/07/2021   Malignant neoplasm of lower-inner quadrant of left breast in female, estrogen receptor positive ([place]) 10/23/2020   Optic neuritis, left 10/18/2020   Myelin oligodendrocyte glycoprotein antibody disorder (MOGAD) ([place]) 07/10/2020   Mitral regurgitation    High risk medication use 05/07/2020   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 Owens Shark PT  Norwood Levo, PT 09/03/2021, 12:44 PM  [facility] @ [place] [place], [place], [address] Phone: [phone]   Fax:  [phone]  Name: Ramsie Ostrander MRN: [id number] Date of Birth: February 28, 1971

## 2021-09-04 ENCOUNTER — Encounter: Payer: Self-pay | Admitting: Internal Medicine

## 2021-09-05 ENCOUNTER — Ambulatory Visit: Payer: Commercial Managed Care - PPO

## 2021-09-05 DIAGNOSIS — I890 Lymphedema, not elsewhere classified: Secondary | ICD-10-CM

## 2021-09-05 DIAGNOSIS — Z483 Aftercare following surgery for neoplasm: Secondary | ICD-10-CM

## 2021-09-05 DIAGNOSIS — C50312 Malignant neoplasm of lower-inner quadrant of left female breast: Secondary | ICD-10-CM | POA: Diagnosis not present

## 2021-09-05 DIAGNOSIS — R293 Abnormal posture: Secondary | ICD-10-CM

## 2021-09-05 DIAGNOSIS — R600 Localized edema: Secondary | ICD-10-CM

## 2021-09-05 NOTE — Therapy (Signed)
[facility] @ [place] [place], [place], [address] Phone: [phone]   Fax:  [phone]  Physical Therapy Treatment  Patient Details  Name: Valerie [name] MRN: [id number] Date of Birth: 05-27-1970 Referring Provider (PT): Dr. Erroll Luna   Encounter Date: 09/05/2021   PT End of Session - 09/05/21 0804     Visit Number 15    Number of Visits 18    Date for PT Re-Evaluation 09/30/21    PT Start Time 0800    PT Stop Time 2878    PT Time Calculation (min) 55 min    Activity Tolerance Patient tolerated treatment well    Behavior During Therapy [facility] for tasks assessed/performed             Past Medical History:  Diagnosis Date   Cancer ([place]) 09/2020   left breast   GERD (gastroesophageal reflux disease)    due to prednisone   Hashimoto's disease    History of radiation therapy 2022   completed 01-18-2021   Mitral regurgitation    mild by echo 06/2020   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   Subjective Assessment - 09/05/21 0811     Subjective All my compression garments came in and I slept in the [place] last night. It was more comfortable than I thought it would  be for being so bulky and I could tell my fingers had come down also. I really like it. And the compression sleeve and glove are much more comfortable than the circular  knit I had been wearing. Everything fits really good.    Pertinent History She had left lumpectomy on 11/15/2020 with 0/3 lymph nodes removed. She says that she has a problem with passing out with medical things but can let us know if she feels this might happen.    Patient Stated Goals get rid of the arm discomfort    Currently in Pain? No/denies                               [facility] Adult PT Treatment/Exercise - 09/05/21 0001       Manual Therapy   Edema Management Assessed fit of new day and night  compression garments, all are excellent fit and pt reports comfortable; Donned new compression sleeve and glove after MLD    Manual Lymphatic Drainage (MLD) Supraclavicular,5 diaphragmatic breaths, Rt axillary and Left inguinal LN's, anterior interaxillary pathway, and left axillo-inguinal pathway, then left UE working from proximal to distal, then retracing all steps                          PT Long Term Goals - 08/19/21 1224       PT LONG TERM GOAL #1   Title pt will be ind with stretches  for pectoralis and chest stretching    Time 6    Period Weeks    Status New      PT LONG TERM GOAL #2   Title Pt and husband will be ind with self bandaging and MLD of the Lt UE    Time 6    Period Weeks    Status New      PT LONG TERM GOAL #3   Title Pt will obtain new compression garments for chronic lymphedema    Time 6    Period Weeks    Status New                   Plan - 09/05/21 8841     Clinical Impression Statement Pt has received new compression garments and all are great fit and comfortable for pt. She is receiving Flexitouch demo in clinic after session today. Pt would like to return for one more visit in ~2 weeks for final assess of Maintenance Phase and remeasure of circumference.    Personal Factors and Comorbidities Past/Current Experience;Comorbidity 3+    Comorbidities autoimmune condition, SLNBradiation,    Examination-Activity  Limitations Lift    Stability/Clinical Decision Making Evolving/Moderate complexity    Rehab Potential Excellent    PT Frequency 2x / week    PT Duration 6 weeks    PT Treatment/Interventions ADLs/Self Care Home Management;Therapeutic exercise;Patient/family education;Manual lymph drainage;Compression bandaging;Manual techniques;Passive range of motion    PT Next Visit Plan Pt to return for one more visit for final assess and remeasure. How was Flexitouch demo?    PT Home Exercise Plan Post op shoulder ROM HEP, dowel exercises; wear compression sleeve and gauntlet daily and daily self MLD    Consulted and Agree with Plan of Care Patient             Patient will benefit from skilled therapeutic intervention in order to improve the following deficits and impairments:  Postural dysfunction, Decreased range of motion, Decreased knowledge of precautions, Impaired UE functional use, Increased edema  Visit Diagnosis: Aftercare following surgery for neoplasm  Localized edema  Malignant neoplasm of lower-inner quadrant of left breast in female, estrogen receptor positive ([place])  Lymphedema, not elsewhere classified  Abnormal posture     Problem List Patient Active Problem List   Diagnosis Date Noted   Hypothyroidism due to Hashimoto's thyroiditis 06/07/2021   Malignant neoplasm of lower-inner quadrant of left breast in female, estrogen receptor positive ([place]) 10/23/2020   Optic neuritis, left 10/18/2020   Myelin oligodendrocyte glycoprotein antibody disorder (MOGAD) ([place]) 07/10/2020   Mitral regurgitation    High risk medication use 05/07/2020   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

## 2021-09-06 ENCOUNTER — Ambulatory Visit (AMBULATORY_SURGERY_CENTER): Payer: Commercial Managed Care - PPO | Admitting: Internal Medicine

## 2021-09-06 ENCOUNTER — Encounter: Payer: Self-pay | Admitting: Internal Medicine

## 2021-09-06 VITALS — BP 136/52 | HR 72 | Temp 97.3°F | Resp 13 | Ht 66.0 in | Wt 177.0 lb

## 2021-09-06 DIAGNOSIS — D120 Benign neoplasm of cecum: Secondary | ICD-10-CM

## 2021-09-06 DIAGNOSIS — Z1211 Encounter for screening for malignant neoplasm of colon: Secondary | ICD-10-CM | POA: Diagnosis not present

## 2021-09-06 DIAGNOSIS — K635 Polyp of colon: Secondary | ICD-10-CM | POA: Diagnosis not present

## 2021-09-06 DIAGNOSIS — K6389 Other specified diseases of intestine: Secondary | ICD-10-CM

## 2021-09-06 HISTORY — PX: COLONOSCOPY: SHX174

## 2021-09-06 MED ORDER — SODIUM CHLORIDE 0.9 % IV SOLN: 500.0000 mL | INTRAVENOUS | Status: DC

## 2021-09-06 NOTE — Op Note (Addendum)
[facility] Patient Name: Valerie [name] Procedure Date: 09/06/2021 8:33 AM MRN: [id number] Endoscopist: Sonny Masters "Valerie [name] ,  Age: 51 Referring MD:  Date of Birth: [birth year] Gender: Female Account #: [id number] Procedure:                Colonoscopy Indications:              Screening for colorectal malignant neoplasm, This                            is the patient's first colonoscopy Medicines:                Monitored Anesthesia Care Procedure:                Pre-Anesthesia Assessment:                           - Prior to the procedure, a History and Physical                            was performed, and patient medications and                            allergies were reviewed. The patient's tolerance of                            previous anesthesia was also reviewed. The risks                            and benefits of the procedure and the sedation                            options and risks were discussed with the patient.                            All questions were answered, and informed consent                            was obtained. Prior Anticoagulants: The patient has                            taken no previous anticoagulant or antiplatelet                            agents. ASA Grade Assessment: II - A patient with                            mild systemic disease. After reviewing the risks                            and benefits, the patient was deemed in                            satisfactory condition to undergo the procedure.  After obtaining informed consent, the colonoscope                            was passed under direct vision. Throughout the                            procedure, the patient's blood pressure, pulse, and                            oxygen saturations were monitored continuously. The                            Olympus CF-HQ190L [id number]) Colonoscope was                            introduced through the  anus and advanced to the the                            terminal ileum. The colonoscopy was performed                            without difficulty. The patient tolerated the                            procedure well. The quality of the bowel                            preparation was good. The terminal ileum, ileocecal                            valve, appendiceal orifice, and rectum were                            photographed. Scope In: 8:38:48 AM Scope Out: 8:56:40 AM Scope Withdrawal Time: 0 hours 11 minutes 18 seconds  Total Procedure Duration: 0 hours 17 minutes 52 seconds  Findings:                 The terminal ileum appeared normal.                           A localized area of granular mucosa was found at                            the ileocecal valve. Biopsies were taken with a                            cold forceps for histology.                           A 6 mm polyp was found in the cecum. The polyp was                            sessile. The polyp was removed with a cold snare.  Resection and retrieval were complete.                           Non-bleeding internal hemorrhoids were found during                            retroflexion. Complications:            No immediate complications. Estimated Blood Loss:     Estimated blood loss was minimal. Impression:               - The examined portion of the ileum was normal.                           - Granular mucosa at the ileocecal valve. Biopsied.                           - One 6 mm polyp in the cecum, removed with a cold                            snare. Resected and retrieved.                           - Non-bleeding internal hemorrhoids. Recommendation:           - Discharge patient to home (with escort).                           - Await pathology results.                           - The findings and recommendations were discussed                            with the patient. Sonny Masters "Valerie [name],  09/06/2021 9:01:33 AM

## 2021-09-06 NOTE — Progress Notes (Signed)
GASTROENTEROLOGY PROCEDURE H&P NOTE   Primary Care Physician: Tawnya Crook, MD    Reason for Procedure:   Colon cancer screening  Plan:    Colonoscopy  Patient is appropriate for endoscopic procedure(s) in the ambulatory ([facility]) setting.  The nature of the procedure, as well as the risks, benefits, and alternatives were carefully and thoroughly reviewed with the patient. Ample time for discussion and questions allowed. The patient understood, was satisfied, and agreed to proceed.     HPI: Valerie [name] is a 51 y.o. female who presents for colonoscopy for colon cancer screening. Denies blood in stools, changes in bowel habits, weight loss. Denies fam hx of colon cancer.  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 Authorizing Provider  ascorbic acid (VITAMIN C) 1000 MG tablet Take 1 tablet by mouth daily.   Yes [provider]  cholecalciferol (VITAMIN D3) 25 MCG (1000 UNIT) tablet Take 5,000 Units by mouth daily.   Yes [provider]  levothyroxine (SYNTHROID) 88 MCG tablet Take 1 tablet (88  mcg total) by mouth daily. 08/30/21  Yes Philemon Kingdom, MD  Magnesium-Potassium (MAG-K/CHELA-MAX PO) Take by mouth.   Yes [provider]  predniSONE (DELTASONE) 5 MG tablet Take up to 3 pills daily as directed Patient taking differently: 5 mg. Every other day 08/29/20  Yes Sater, Nanine Means, MD  Probiotic Product (CULTRELLE KIDS IMMUNE DEFENSE PO) Take 1 tablet by mouth daily.   Yes [provider]  tamoxifen (NOLVADEX) 10 MG tablet Take 1 tablet by mouth daily.   Yes [provider]  Tocilizumab (ACTEMRA) 162 MG/0.9ML SOSY For MOGAD 06/29/21  Yes [provider]  UNABLE TO FIND Take 4 tablets by mouth daily. Med Name: Omega Pure 2 tablet in AM and 2 tablet in PM   Yes [provider]  UNABLE TO FIND Take by mouth daily. Med Name: Ultra Inflamix 2 scoops daily   Yes [provider]  UNABLE TO FIND Take 2 tablets by mouth daily. Med Name: R-Lipoic Acid   Yes [provider]  omeprazole (PRILOSEC) 20 MG capsule Take 20 mg by mouth as needed.    [provider]    Current Outpatient Medications  Medication Sig Dispense Refill   ascorbic acid (VITAMIN C) 1000 MG tablet Take 1 tablet by mouth daily.     cholecalciferol (VITAMIN D3) 25 MCG (1000 UNIT) tablet Take 5,000 Units by mouth daily.     levothyroxine (SYNTHROID) 88 MCG tablet Take 1 tablet (88 mcg total) by mouth daily. 90 tablet 1  Magnesium-Potassium (MAG-K/CHELA-MAX PO) Take by mouth.     predniSONE (DELTASONE) 5 MG tablet Take up to 3 pills daily as directed (Patient taking differently: 5 mg. Every other day) 100 tablet 3   Probiotic Product (CULTRELLE KIDS IMMUNE DEFENSE PO) Take 1 tablet by mouth daily.     tamoxifen (NOLVADEX) 10 MG tablet Take 1 tablet by mouth daily.     Tocilizumab (ACTEMRA) 162 MG/0.9ML SOSY For MOGAD     UNABLE TO FIND Take 4 tablets by mouth daily. Med Name: Omega Pure 2 tablet in AM and 2 tablet in PM     UNABLE TO FIND Take by mouth  daily. Med Name: Ultra Inflamix 2 scoops daily     UNABLE TO FIND Take 2 tablets by mouth daily. Med Name: R-Lipoic Acid     omeprazole (PRILOSEC) 20 MG capsule Take 20 mg by mouth as needed.     Current Facility-Administered Medications  Medication Dose Route Frequency Provider Last Rate Last Admin   0.9 %  sodium chloride infusion  500 mL Intravenous Continuous Sharyn Creamer, MD        Allergies as of 09/06/2021   (No Known Allergies)    Family History  Problem Relation Age of Onset   Hearing loss Mother    Thyroid disease Mother    Cancer Mother 40       endometrial cancer, lynch negative   GER disease Mother    Other Mother        uterine fibroids, optic disc abnormality    Eczema Mother    Colon polyps Mother    Hypothyroidism Mother    Osteoarthritis Mother    Hypertension Mother    Migraines Mother    Colon polyps Father    Hearing loss Father    Other Father        Colon adenoma   Hyperlipidemia Father    GER disease Father    Heart murmur Father    Amblyopia Father        Right eye   Neuropathy Father    Glaucoma Maternal Grandmother    Macular degeneration Maternal Grandmother    Multiple sclerosis Paternal Grandmother    Breast cancer Neg Hx    Colon cancer Neg Hx    Esophageal cancer Neg Hx    Rectal cancer Neg Hx    Stomach cancer Neg Hx     Social History   Socioeconomic History   Marital status: Married    Spouse name: Not on file   Number of children: 2   Years of education: BS   Highest education level: Not on file  Occupational History   Occupation: Agricultural consultant   Occupation: Shipping and receiving clerk  Tobacco Use   Smoking status: Never   Smokeless tobacco: Never  Vaping Use   Vaping Use: Never used  Substance and Sexual Activity   Alcohol use: Never   Drug use: Never   Sexual activity: Yes    Birth control/protection: Surgical    Comment: BTL  Other Topics Concern   Not on file  Social History Narrative   Right handed     Caffeine use: rare   Social Determinants of Health   Financial Resource Strain: Low Risk    Difficulty of Paying Living Expenses: Not hard at all  Food Insecurity: No Food Insecurity   Worried About Charity fundraiser in the Last Year: Never true   [place] in the Last Year: Never  true  Transportation Needs: No Transportation Needs   Lack of Transportation (Medical): No   Lack of Transportation (Non-Medical): No  Physical Activity: Inactive   Days of Exercise per Week: 0 days   Minutes of Exercise per Session: 0 min  Stress: No Stress Concern Present   Feeling of Stress : Not at all  Social Connections: Socially Integrated   Frequency of Communication with Friends and Family: More than three times a week   Frequency of Social Gatherings with Friends and Family: Three times a week   Attends Religious Services: More than 4 times per year   Active Member of Clubs or Organizations: Yes   Attends Archivist Meetings: 1 to 4 times per year   Marital Status: Married  Human resources officer Violence: Not At Risk   Fear of Current or Ex-Partner: No   Emotionally Abused: No   Physically Abused: No   Sexually Abused: No    Physical Exam: Vital signs in last 24 hours: BP 126/90   Pulse 86   Temp (!) 97.3 F (36.3 C)   Ht '5\' 6"'$  (1.676 m)   Wt 177 lb (80.3 kg)   SpO2 97%   BMI 28.57 kg/m  GEN: NAD EYE: Sclerae anicteric ENT: MMM CV: Non-tachycardic Pulm: No increased work of breathing GI: Soft, NT/ND NEURO:  Alert & Oriented   Christia Reading, MD [facility]  09/06/2021 8:02 AM

## 2021-09-06 NOTE — Progress Notes (Signed)
Called to room to assist during endoscopic procedure.  Patient ID and intended procedure confirmed with present staff. Received instructions for my participation in the procedure from the performing physician.  

## 2021-09-06 NOTE — Patient Instructions (Signed)
Await pathology  Please read over handouts about polyps and hemorrhoids  Continue your normal medications   YOU HAD AN ENDOSCOPIC PROCEDURE TODAY AT [facility]:   Refer to the procedure report that was given to you for any specific questions about what was found during the examination.  If the procedure report does not answer your questions, please call your gastroenterologist to clarify.  If you requested that your care partner not be given the details of your procedure findings, then the procedure report has been included in a sealed envelope for you to review at your convenience later.  YOU SHOULD EXPECT: Some feelings of bloating in the abdomen. Passage of more gas than usual.  Walking can help get rid of the air that was put into your GI tract during the procedure and reduce the bloating. If you had a lower endoscopy (such as a colonoscopy or flexible sigmoidoscopy) you may notice spotting of blood in your stool or on the toilet paper. If you underwent a bowel prep for your procedure, you may not have a normal bowel movement for a few days.  Please Note:  You might notice some irritation and congestion in your nose or some drainage.  This is from the oxygen used during your procedure.  There is no need for concern and it should clear up in a day or so.  SYMPTOMS TO REPORT IMMEDIATELY:  Following lower endoscopy (colonoscopy or flexible sigmoidoscopy):  Excessive amounts of blood in the stool  Significant tenderness or worsening of abdominal pains  Swelling of the abdomen that is new, acute  Fever of 100F or higher  For urgent or emergent issues, a gastroenterologist can be reached at any hour by calling [phone]. Do not use MyChart messaging for urgent concerns.    DIET:  We do recommend a small meal at first, but then you may proceed to your regular diet.  Drink plenty of fluids but you should avoid alcoholic beverages for 24 hours.  ACTIVITY:  You should  plan to take it easy for the rest of today and you should NOT DRIVE or use heavy machinery until tomorrow (because of the sedation medicines used during the test).    FOLLOW UP: Our staff will call the number listed on your records 48-72 hours following your procedure to check on you and address any questions or concerns that you may have regarding the information given to you following your procedure. If we do not reach you, we will leave a message.  We will attempt to reach you two times.  During this call, we will ask if you have developed any symptoms of COVID 19. If you develop any symptoms (ie: fever, flu-like symptoms, shortness of breath, cough etc.) before then, please call [phone].  If you test positive for Covid 19 in the 2 weeks post procedure, please call and report this information to [place].    If any biopsies were taken you will be contacted by phone or by letter within the next 1-3 weeks.  Please call us at [phone] if you have not heard about the biopsies in 3 weeks.    SIGNATURES/CONFIDENTIALITY: You and/or your care partner have signed paperwork which will be entered into your electronic medical record.  These signatures attest to the fact that that the information above on your After Visit Summary has been reviewed and is understood.  Full responsibility of the confidentiality of this discharge information lies with you and/or your care-partner.

## 2021-09-06 NOTE — Progress Notes (Signed)
A and O x3. Report to RN. Tolerated MAC anesthesia well. 

## 2021-09-10 ENCOUNTER — Telehealth: Payer: Self-pay

## 2021-09-10 NOTE — Telephone Encounter (Signed)
  Follow up Call-     09/06/2021    7:34 AM  Call back number  Post procedure Call Back phone  # [phone]  Permission to leave phone message Yes     Patient questions:  Do you have a fever, pain , or abdominal swelling? No. Pain Score  0 *  Have you tolerated food without any problems? Yes.    Have you been able to return to your normal activities? Yes.    Do you have any questions about your discharge instructions: Diet   No. Medications  No. Follow up visit  No.  Do you have questions or concerns about your Care? No.  Actions: * If pain score is 4 or above: No action needed, pain <4.

## 2021-09-11 ENCOUNTER — Encounter: Payer: Commercial Managed Care - PPO | Admitting: Rehabilitation

## 2021-09-11 ENCOUNTER — Encounter: Payer: Self-pay | Admitting: Internal Medicine

## 2021-09-12 ENCOUNTER — Ambulatory Visit
Admission: RE | Admit: 2021-09-12 | Discharge: 2021-09-12 | Disposition: A | Discharge status: Home / Self Care | Payer: Commercial Managed Care - PPO | Source: Ambulatory Visit | Attending: Adult Health | Admitting: Adult Health

## 2021-09-12 ENCOUNTER — Ambulatory Visit: Payer: Commercial Managed Care - PPO | Attending: Adult Health

## 2021-09-12 DIAGNOSIS — Z170 Estrogen receptor positive status [ER+]: Secondary | ICD-10-CM | POA: Insufficient documentation

## 2021-09-12 DIAGNOSIS — R600 Localized edema: Secondary | ICD-10-CM | POA: Diagnosis present

## 2021-09-12 DIAGNOSIS — Z483 Aftercare following surgery for neoplasm: Secondary | ICD-10-CM | POA: Diagnosis present

## 2021-09-12 DIAGNOSIS — C50312 Malignant neoplasm of lower-inner quadrant of left female breast: Secondary | ICD-10-CM | POA: Diagnosis present

## 2021-09-12 DIAGNOSIS — R293 Abnormal posture: Secondary | ICD-10-CM | POA: Diagnosis present

## 2021-09-12 DIAGNOSIS — M25612 Stiffness of left shoulder, not elsewhere classified: Secondary | ICD-10-CM | POA: Insufficient documentation

## 2021-09-12 DIAGNOSIS — I890 Lymphedema, not elsewhere classified: Secondary | ICD-10-CM | POA: Insufficient documentation

## 2021-09-12 IMAGING — MG DIGITAL DIAGNOSTIC BILAT W/ TOMO W/ CAD
6 of 9 series · 6 of 25 positions shown · non-contrast
Comparison: Previous exam(s).

CLINICAL DATA: History of LEFT breast cancer in [K2] status post
lumpectomy. New baseline.

EXAM:
DIGITAL DIAGNOSTIC BILATERAL MAMMOGRAM WITH TOMOSYNTHESIS AND CAD
TECHNIQUE: Bilateral digital diagnostic mammography and breast tomosynthesis
was performed. The images were evaluated with computer-aided
detection.

[L CC]
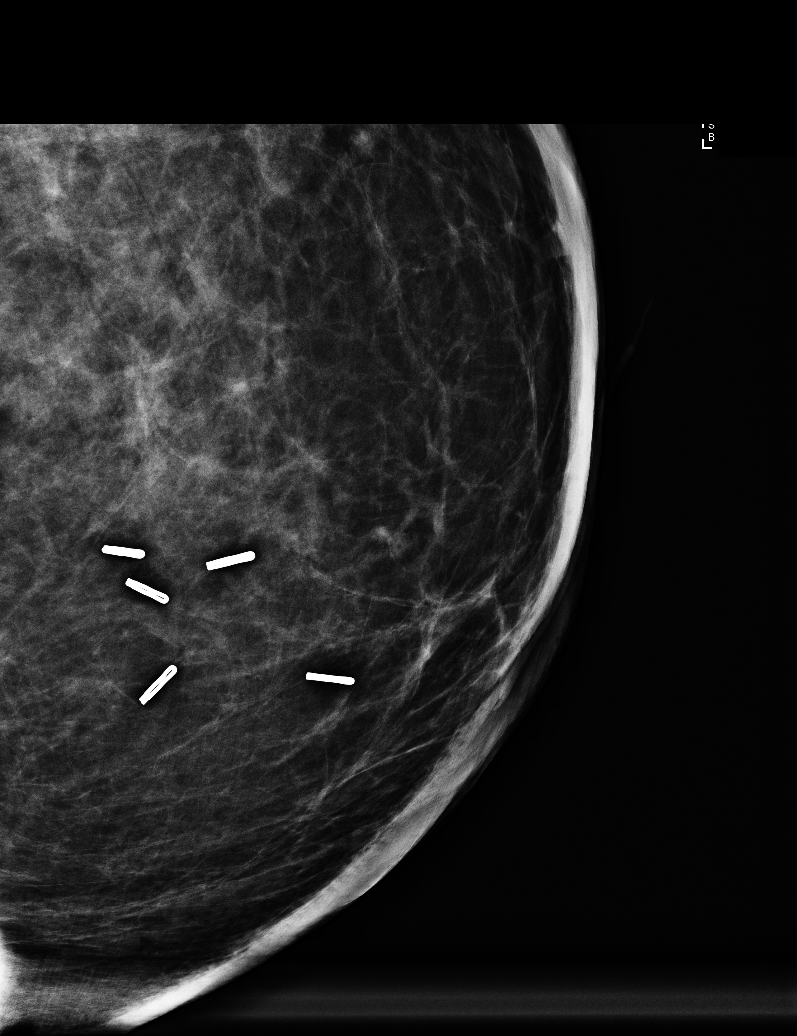

[R MLO synth-2D]
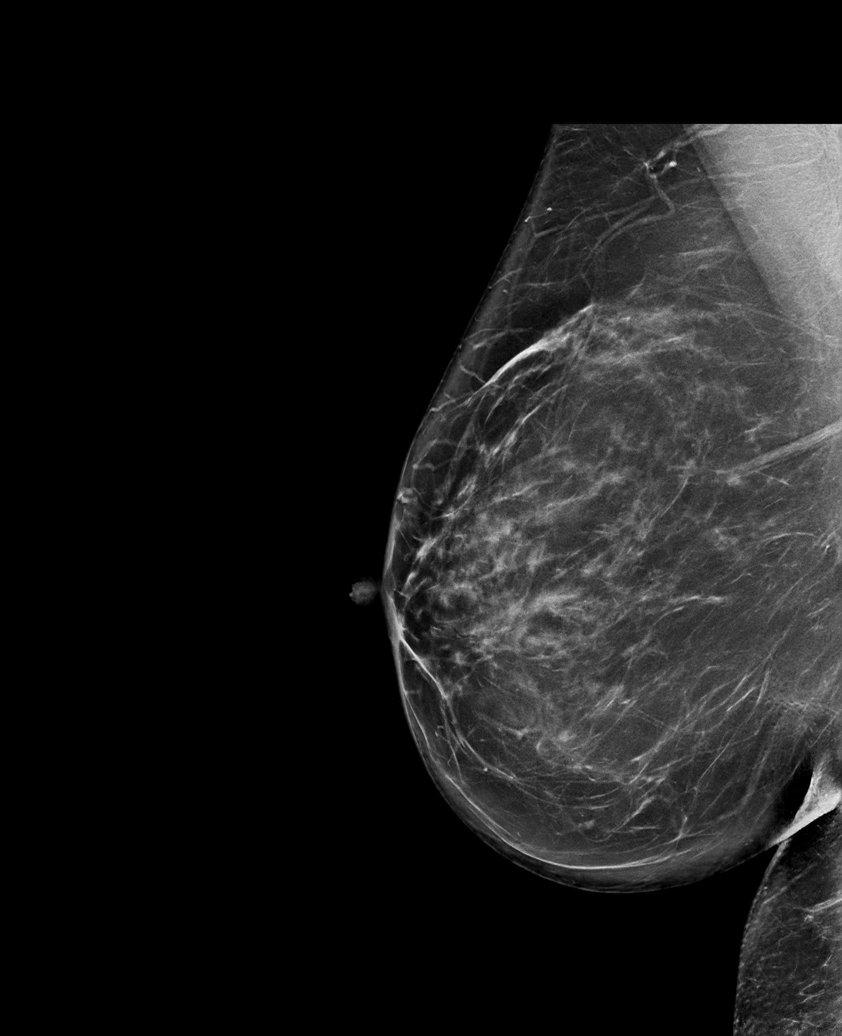

[L MLO synth-2D]
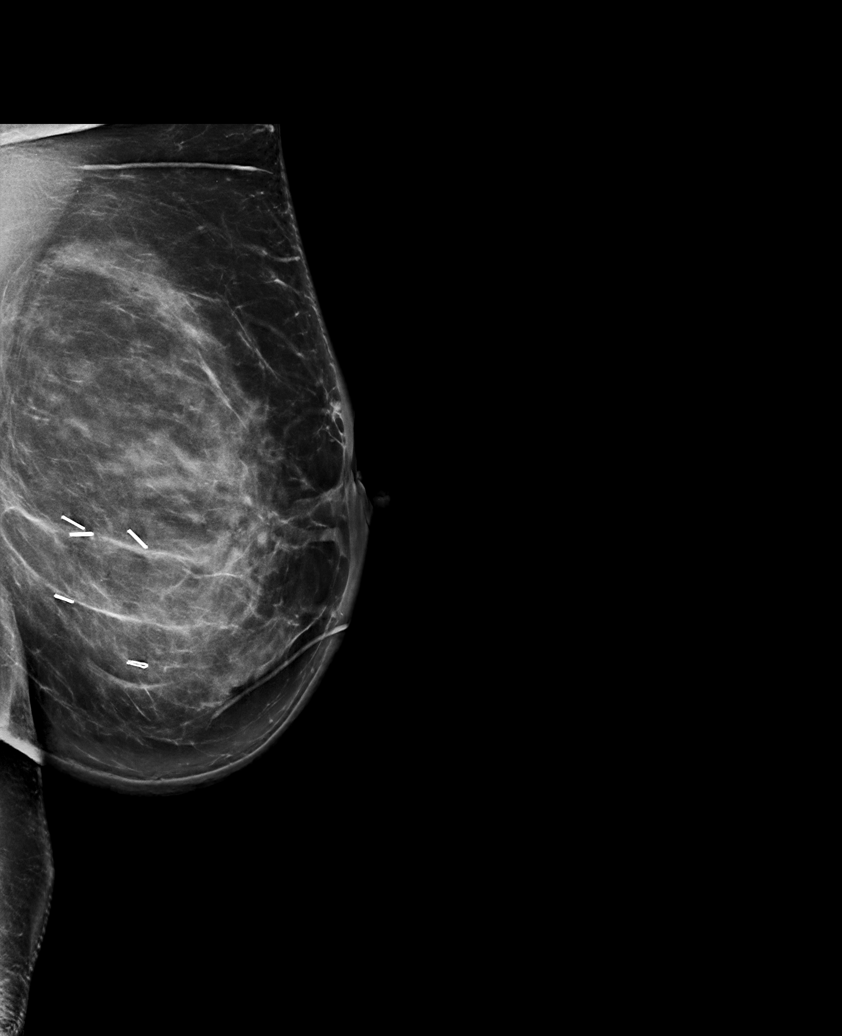

[R CC synth-2D]
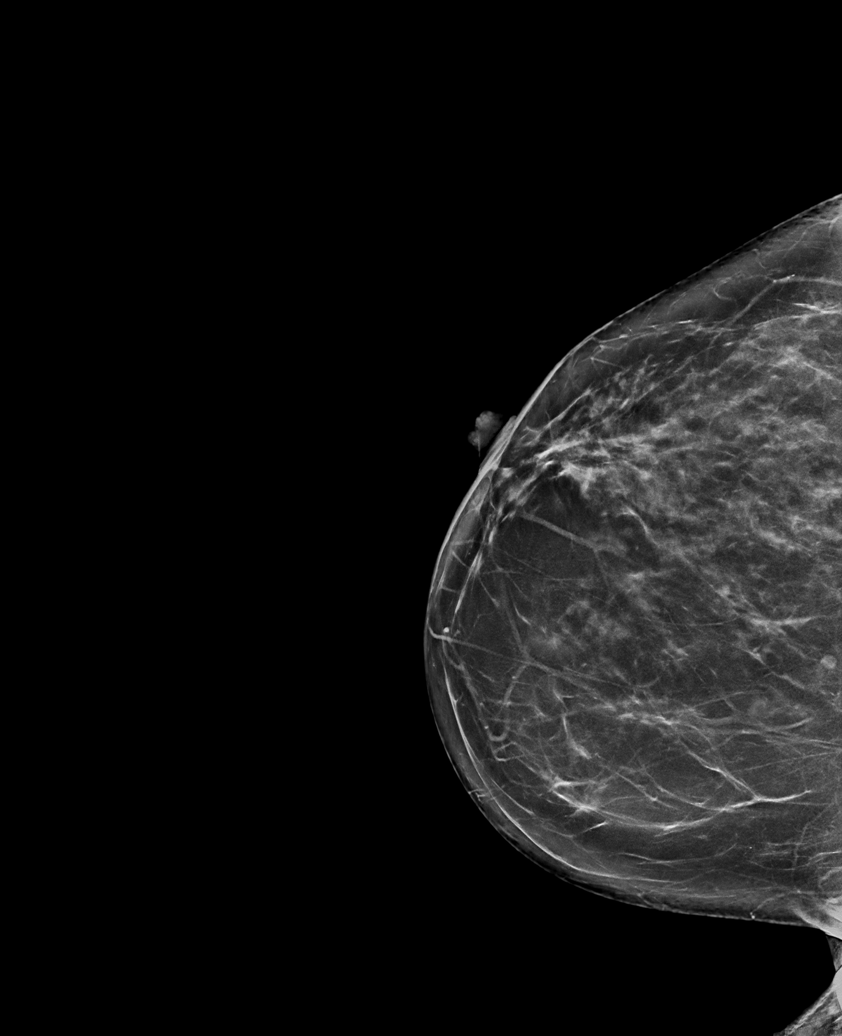

[L CC synth-2D]
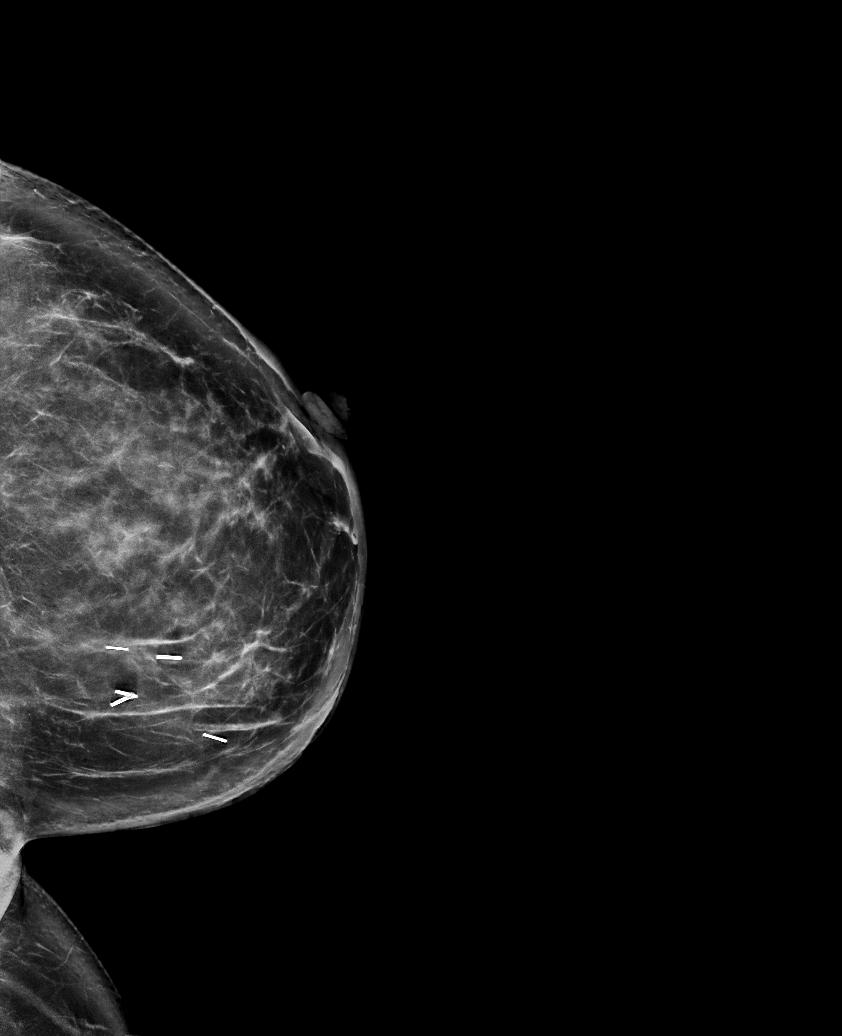

[R MLO tomo · tomo slice 46/91.0]
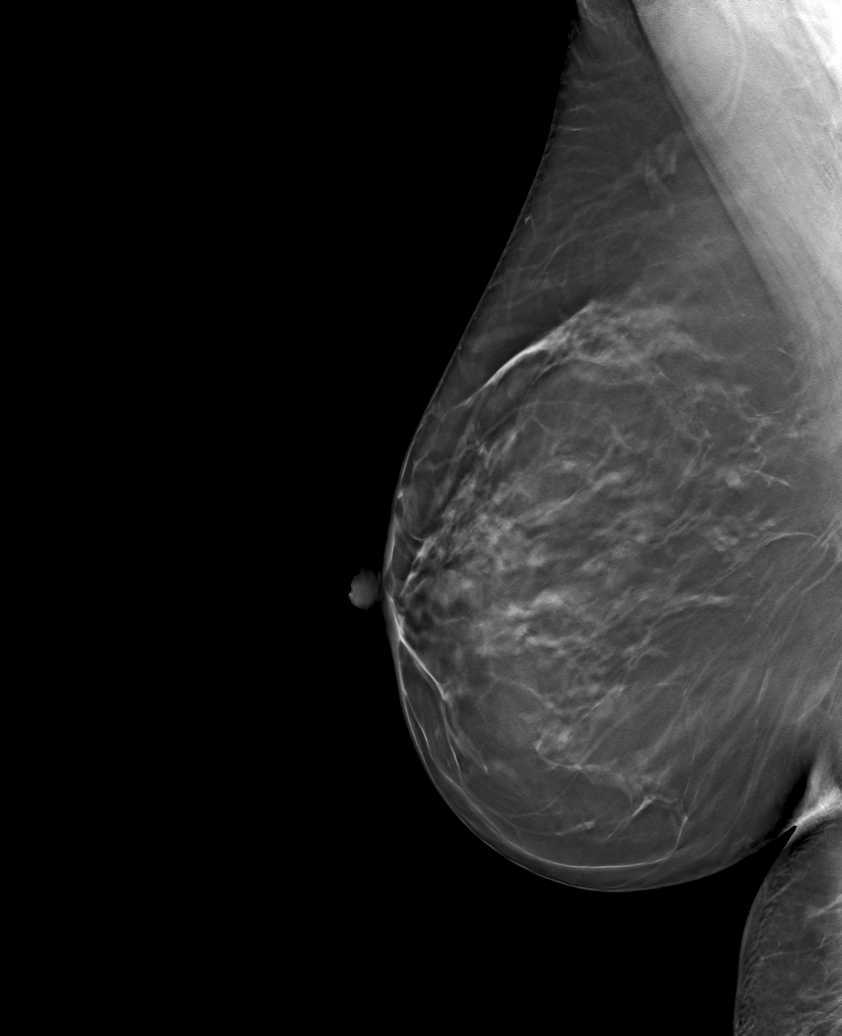

[6 of 25 positions shown; findings below may reference images not displayed]

ACR Breast Density Category c: The breast tissue is heterogeneously
dense, which may obscure small masses.
FINDINGS: There are expected postsurgical changes within the LEFT breast.
There are no new dominant masses, suspicious calcifications or
secondary signs of malignancy within either breast.
IMPRESSION: No evidence of malignancy within either breast. Expected
postsurgical changes within the LEFT breast.

RECOMMENDATION:
Bilateral diagnostic mammogram in 1 year.

I have discussed the findings and recommendations with the patient.
If applicable, a reminder letter will be sent to the patient
regarding the next appointment.

BI-RADS CATEGORY  2: Benign.

## 2021-09-12 NOTE — Therapy (Addendum)
[facility] @ [address] [place], [place], [address] Phone: [phone]   Fax:  [phone]  Physical Therapy Treatment  Patient Details  Name: Valerie [name] MRN: [id number] Date of Birth: [birth year] Referring Provider (PT): Dr. Erroll Luna   Encounter Date: 09/12/2021   PT End of Session - 09/12/21 0920     Visit Number 16    Number of Visits 18    Date for PT Re-Evaluation 09/30/21    PT Start Time 0905    PT Stop Time 1001    PT Time Calculation (min) 56 min    Activity Tolerance Patient tolerated treatment well    Behavior During Therapy [facility] for tasks assessed/performed             Past Medical History:  Diagnosis Date   Cancer ([facility]) 09/2020   left breast   GERD (gastroesophageal reflux disease)    due to prednisone   Hashimoto's disease    History of radiation therapy 2022   completed 01-18-2021   Mitral regurgitation    mild by echo 06/2020   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   Subjective Assessment - 09/12/21 0922     Subjective I snagged my sleeve! It isn't a hole but it does have a snag now, I bump into a nail in my garage in the wall. I'll call [facility] and see if there isn't something that can be done. Also the back of my hand has started to swell a little more despite constant wear of my day and night garments.    Pertinent  History She had left lumpectomy on 11/15/2020 with 0/3 lymph nodes removed. She says that she has a problem with passing out with medical things but can let us know if she feels this might happen.    Patient Stated Goals get rid of the arm discomfort    Currently in Pain? No/denies                               [facility] Adult PT Treatment/Exercise - 09/12/21 0001       Manual Therapy   Edema Management Issued 1/4"  gray foam for pt to wear at dorsal hand and at wrist to see if this will facilitate lymphatic flow out of dorsum of hand. Also encouraged her to wear her 1/2" gray foam and/or small dot peach medi foam at dorsal hand in nighttime garment. Assiste dher with donning her compression sleeve and glove after session and added the 1/4" wrist piece under sleeve and 1/4" dorsal piece in glove    Manual Lymphatic Drainage (MLD) Supraclavicular,5 diaphragmatic breaths, Rt axillary and Left inguinal LN's, anterior interaxillary pathway, and left axillo-inguinal pathway, then left UE working from proximal to distal spending extra time on her dorsal hand where pitting edema noted today, then  retracing all steps                          PT Long Term Goals - 08/19/21 1224       PT LONG TERM GOAL #1   Title pt will be ind with stretches for pectoralis and chest stretching    Time 6    Period Weeks    Status New      PT LONG TERM GOAL #2   Title Pt and husband will be ind with self bandaging and MLD of the Lt UE    Time 6    Period Weeks    Status New      PT LONG TERM GOAL #3   Title Pt will obtain new compression garments for chronic lymphedema    Time 6    Period Weeks    Status New                   Plan - 09/12/21 1004     Clinical Impression Statement Pt returns noting some increased dorsal hand edema despite mostly 24/7 wear of her day and night compression. Worked on troubleshooting with her by adding varying pieces of gray compression  foam (see flowsheet) to better assist lymphatic flow out of her hand. Then spent focused time here during MLD and her pitting edema was much improved by softening noted and no pitting by end of session. Pt will be out of town and return in about 2 weeks. Pt is also eager to get a Flexitouch as this will be beneficial to her Maintenance Phase of treatment at this time due to the fact that patient has trialed and failed four weeks or more of conservative therapies with compression, exercise and elevation.    Personal Factors and Comorbidities Past/Current Experience;Comorbidity 3+    Comorbidities autoimmune condition, SLNBradiation,    Examination-Activity Limitations Lift    Stability/Clinical Decision Making Evolving/Moderate complexity    Rehab Potential Excellent    PT Frequency 2x / week    PT Duration 6 weeks    PT Treatment/Interventions ADLs/Self Care Home Management;Therapeutic exercise;Patient/family education;Manual lymph drainage;Compression bandaging;Manual techniques;Passive range of motion    PT Next Visit Plan Pt to return for one more visit for final assess and remeasure. How is dorsal hand? Get Flexitouch?    PT Home Exercise Plan Post op shoulder ROM HEP, dowel exercises; wear compression sleeve and gauntlet daily and daily self MLD    Consulted and Agree with Plan of Care Patient             Patient will benefit from skilled therapeutic intervention in order to improve the following deficits and impairments:  Postural dysfunction, Decreased range of motion, Decreased knowledge of precautions, Impaired UE functional use, Increased edema  Visit Diagnosis: Aftercare following surgery for neoplasm  Localized edema  Malignant neoplasm of lower-inner quadrant of left breast in female, estrogen receptor positive ([facility])  Lymphedema, not elsewhere classified  Abnormal posture     Problem List Patient Active Problem List   Diagnosis Date Noted   Hypothyroidism due to  Hashimoto's thyroiditis 06/07/2021   Malignant neoplasm of lower-inner quadrant of left breast in female, estrogen receptor positive ([place]) 10/23/2020   Optic neuritis, left 10/18/2020   Myelin oligodendrocyte glycoprotein antibody disorder (MOGAD) ([facility]) 07/10/2020   Mitral regurgitation    High risk medication use 05/07/2020   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

## 2021-09-13 ENCOUNTER — Encounter: Payer: Self-pay | Admitting: Neurology

## 2021-09-13 ENCOUNTER — Encounter: Payer: Commercial Managed Care - PPO | Admitting: Rehabilitation

## 2021-09-15 ENCOUNTER — Encounter: Payer: Self-pay | Admitting: Hematology and Oncology

## 2021-09-16 NOTE — Telephone Encounter (Signed)
Faxed updated prescriber foundation form for 90days supply to [employer] at [phone]. Received fax confirmation.

## 2021-09-19 ENCOUNTER — Ambulatory Visit: Payer: Commercial Managed Care - PPO | Admitting: Hematology and Oncology

## 2021-09-24 NOTE — Progress Notes (Incomplete)
Patient Care Team: Tawnya Crook, MD as PCP - General (Family Medicine) Sueanne Margarita, MD as PCP - Cardiology (Cardiology) Erroll Luna, MD as Consulting Physician (General Surgery) Nicholas Lose, MD as Consulting Physician (Hematology and Oncology) Kyung Rudd, MD as Consulting Physician (Radiation Oncology) Felecia Shelling, Nanine Means, MD (Neurology)  DIAGNOSIS: No diagnosis found.  SUMMARY OF ONCOLOGIC HISTORY: Oncology History  Malignant neoplasm of lower-inner quadrant of left breast in female, estrogen receptor positive ([place])  10/23/2020 Initial Diagnosis   Screening mammogram on 09/11/20 showed possible distortion with calcifications in the left breast. Diagnostic mammogram and [place] on 10/03/20 showed 0.6 cm group of indeterminate lower inner left breast calcifications with possible associated subtle distortion and no abnormal appearing left axillary lymph nodes. Biopsy on 10/17/20 showed invasive ductal carcinoma, DCIS with calcifications, ER+(90%)/PR(95%)/Ki67(<5%).   10/24/2020 Cancer Staging   Staging form: Breast, AJCC 8th Edition - Clinical stage from 10/24/2020: Stage IA (cT1b, cN0, cM0, G1, ER+, PR+, HER2-) - Signed by Nicholas Lose, MD on 10/24/2020 Stage prefix: Initial diagnosis Histologic grading system: 3 grade system Laterality: Left Staged by: Pathologist and managing physician Stage used in treatment planning: Yes National guidelines used in treatment planning: Yes Type of national guideline used in treatment planning: NCCN   11/15/2020 Surgery   Left lumpectomy: No residual cancer, fibrocystic change, margins negative, 0/3 lymph nodes negative (based on the biopsy tumor size: 2 mm), previous ER 90%, PR 95%, HER2 negative, Ki-67 less than 5%   12/25/2020 - 01/18/2021 Radiation Therapy   Adjuvant radiation   02/21/2021 -  Anti-estrogen oral therapy   Tamoxifen     CHIEF COMPLIANT: Follow-up of left breast cancer on Tamoxifen.  INTERVAL HISTORY: Valerie [name] is a 51 y.o. with above-mentioned history of left breast cancer, currently on Tamoxifen. She presents to the clinic today for follow-up.    ALLERGIES:  has No Known Allergies.  MEDICATIONS:  Current Outpatient Medications  Medication Sig Dispense Refill   ascorbic acid (VITAMIN C) 1000 MG tablet Take 1 tablet by mouth daily.     cholecalciferol (VITAMIN D3) 25 MCG (1000 UNIT) tablet Take 5,000 Units by mouth daily.     levothyroxine (SYNTHROID) 88 MCG tablet Take 1 tablet (88 mcg total) by mouth daily. 90 tablet 1   Magnesium-Potassium (MAG-K/CHELA-MAX PO) Take by mouth.     omeprazole (PRILOSEC) 20 MG capsule Take 20 mg by mouth as needed.     predniSONE (DELTASONE) 5 MG tablet Take up to 3 pills daily as directed (Patient taking differently: 5 mg. Every other day) 100 tablet 3   Probiotic Product (CULTRELLE KIDS IMMUNE DEFENSE PO) Take 1 tablet by mouth daily.     tamoxifen (NOLVADEX) 10 MG tablet Take 1 tablet by mouth daily.     Tocilizumab (ACTEMRA) 162 MG/0.9ML SOSY For MOGAD     UNABLE TO FIND Take 4 tablets by mouth daily. Med Name: Omega Pure 2 tablet in AM and 2 tablet in PM     UNABLE TO FIND Take by mouth daily. Med Name: Ultra Inflamix 2 scoops daily     UNABLE TO FIND Take 2 tablets by mouth daily. Med Name: R-Lipoic Acid     No current facility-administered medications for this visit.    PHYSICAL EXAMINATION: ECOG PERFORMANCE STATUS: {CHL ONC ECOG PS:[phone]}  There were no vitals filed for this visit. There were no vitals filed for this visit.  BREAST:*** No palpable masses or nodules in either right or left breasts. No palpable  axillary supraclavicular or infraclavicular adenopathy no breast tenderness or nipple discharge. (exam performed in the presence of a chaperone)  LABORATORY DATA:  I have reviewed the data as listed    Latest Ref Rng & Units 06/11/2021    8:06 AM 10/24/2020    8:40 AM 07/05/2020   11:54 AM  CMP  Glucose 70 - 99 mg/dL 91  87   103   BUN 6 - 24 mg/dL 17  23  16    Creatinine 0.57 - 1.00 mg/dL 0.98  0.97  0.92   Sodium 134 - 144 mmol/L 139  143  137   Potassium 3.5 - 5.2 mmol/L 4.9  4.6  4.8   Chloride 96 - 106 mmol/L 105  109  98   CO2 20 - 29 mmol/L 22  25  25    Calcium 8.7 - 10.2 mg/dL 8.7  9.0  9.2   Total Protein 6.0 - 8.5 g/dL 6.2  6.8    Total Bilirubin 0.0 - 1.2 mg/dL <0.2  0.3    Alkaline Phos 44 - 121 IU/L 36  41    AST 0 - 40 IU/L 16  17    ALT 0 - 32 IU/L 26  16      Lab Results  Component Value Date   WBC 7.1 06/11/2021   HGB 14.4 06/11/2021   HCT 44.5 06/11/2021   MCV 97 06/11/2021   PLT 221 06/11/2021   NEUTROABS 5.4 06/11/2021    ASSESSMENT & PLAN:  No problem-specific Assessment & Plan notes found for this encounter.    No orders of the defined types were placed in this encounter.  The patient has a good understanding of the overall plan. she agrees with it. she will call with any problems that may develop before the next visit here. Total time spent: 30 mins including face to face time and time spent for planning, charting and co-ordination of care   Suzzette Righter, [place] 09/24/21    I Gardiner Coins am scribing for Dr. Lindi Adie  ***

## 2021-09-25 ENCOUNTER — Ambulatory Visit: Payer: Commercial Managed Care - PPO | Admitting: Rehabilitation

## 2021-09-25 ENCOUNTER — Encounter: Payer: Self-pay | Admitting: Rehabilitation

## 2021-09-25 DIAGNOSIS — R600 Localized edema: Secondary | ICD-10-CM

## 2021-09-25 DIAGNOSIS — I890 Lymphedema, not elsewhere classified: Secondary | ICD-10-CM

## 2021-09-25 DIAGNOSIS — Z483 Aftercare following surgery for neoplasm: Secondary | ICD-10-CM | POA: Diagnosis not present

## 2021-09-25 DIAGNOSIS — Z170 Estrogen receptor positive status [ER+]: Secondary | ICD-10-CM

## 2021-09-25 DIAGNOSIS — R293 Abnormal posture: Secondary | ICD-10-CM

## 2021-09-25 DIAGNOSIS — M25612 Stiffness of left shoulder, not elsewhere classified: Secondary | ICD-10-CM

## 2021-09-25 NOTE — Therapy (Signed)
[facility] @ [facility] [place], [place], [address] Phone: [phone]   Fax:  [phone]  Physical Therapy Treatment  Patient Details  Name: Valerie [name] MRN: [id number] Date of Birth: 06/14/1970 Referring Provider (PT): Dr. Erroll Luna   Encounter Date: 09/25/2021   PT End of Session - 09/25/21 1602     Visit Number 17    Number of Visits 18    Date for PT Re-Evaluation 09/30/21    PT Start Time 1603    PT Stop Time 1245    PT Time Calculation (min) 47 min    Activity Tolerance Patient tolerated treatment well    Behavior During Therapy [facility] for tasks assessed/performed             Past Medical History:  Diagnosis Date   Cancer ([place]) 09/2020   left breast   GERD (gastroesophageal reflux disease)    due to prednisone   Hashimoto's disease    History of radiation therapy 2022   completed 01-18-2021   Mitral regurgitation    mild by echo 06/2020   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   Subjective Assessment - 09/25/21 1652     Subjective I think I found a rhythm.  When I get home I put my gauntlet on with a piece of foam. I will be getting a 75% discount on the OOP cost of the flexitouch.  I don't have it yet.. I flew back today.    Pertinent History She had left lumpectomy on 11/15/2020 with 0/3 lymph nodes removed. She says that she  has a problem with passing out with medical things but can let us know if she feels this might happen.    Currently in Pain? No/denies                   LYMPHEDEMA/ONCOLOGY QUESTIONNAIRE - 09/25/21 0001       Left Upper Extremity Lymphedema   At Axilla  31.5 cm    15 cm Proximal to Olecranon Process 31 cm    10 cm Proximal to Olecranon Process 31.5 cm    Olecranon Process 26.5 cm    15 cm Proximal to Ulnar Styloid Process 24.5 cm    10 cm Proximal to Ulnar Styloid Process 22 cm    Just Proximal to Ulnar Styloid Process 15 cm    Across Hand at [employer] 18.8 cm    At [place] of 2nd Digit 6.3 cm                        [facility] Adult PT Treatment/Exercise - 09/25/21 0001       Manual Therapy   Manual therapy comments remeasured size    Manual Lymphatic Drainage (MLD) Supraclavicular,5 diaphragmatic breaths, bil axillary and Left inguinal LN's, anterior interaxillary pathway, and left axillo-inguinal pathway, then left  UE working from proximal to distal spending extra time on her dorsal hand where pitting edema noted today, then retracing all steps                          PT Long Term Goals - 08/19/21 1224       PT LONG TERM GOAL #1   Title pt will be ind with stretches for pectoralis and chest stretching    Time 6    Period Weeks    Status New      PT LONG TERM GOAL #2   Title Pt and husband will be ind with self bandaging and MLD of the Lt UE    Time 6    Period Weeks    Status New      PT LONG TERM GOAL #3   Title Pt will obtain new compression garments for chronic lymphedema    Time 6    Period Weeks    Status New                   Plan - 09/25/21 1656     Clinical Impression Statement Pt returns after 2 week trip to [place].  Pt flew home just today.  The UE remains the same size except for 0.5cm larger in the back of the hand.  Pt has not yet received pump so we planned to have her call and make an appointment 4  weeks after receiving the pump for recheck.    PT Next Visit Plan visit 4 weeks post getting flexitouch    Consulted and Agree with Plan of Care Patient             Patient will benefit from skilled therapeutic intervention in order to improve the following deficits and impairments:     Visit Diagnosis: Aftercare following surgery for neoplasm  Localized edema  Malignant neoplasm of lower-inner quadrant of left breast in female, estrogen receptor positive ([place])  Lymphedema, not elsewhere classified  Abnormal posture  Stiffness of left shoulder, not elsewhere classified     Problem List Patient Active Problem List   Diagnosis Date Noted   Hypothyroidism due to Hashimoto's thyroiditis 06/07/2021   Malignant neoplasm of lower-inner quadrant of left breast in female, estrogen receptor positive ([place]) 10/23/2020   Optic neuritis, left 10/18/2020   Myelin oligodendrocyte glycoprotein antibody disorder (MOGAD) ([place]) 07/10/2020   Mitral regurgitation    High risk medication use 05/07/2020   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

## 2021-10-03 ENCOUNTER — Encounter: Payer: Self-pay | Admitting: Rehabilitation

## 2021-10-08 ENCOUNTER — Other Ambulatory Visit: Payer: Self-pay

## 2021-10-08 ENCOUNTER — Inpatient Hospital Stay: Payer: Commercial Managed Care - PPO | Attending: Hematology and Oncology | Admitting: Hematology and Oncology

## 2021-10-08 DIAGNOSIS — Z170 Estrogen receptor positive status [ER+]: Secondary | ICD-10-CM | POA: Diagnosis not present

## 2021-10-08 DIAGNOSIS — Z79810 Long term (current) use of selective estrogen receptor modulators (SERMs): Secondary | ICD-10-CM | POA: Insufficient documentation

## 2021-10-08 DIAGNOSIS — Z923 Personal history of irradiation: Secondary | ICD-10-CM | POA: Insufficient documentation

## 2021-10-08 DIAGNOSIS — C50312 Malignant neoplasm of lower-inner quadrant of left female breast: Secondary | ICD-10-CM | POA: Diagnosis not present

## 2021-10-16 ENCOUNTER — Other Ambulatory Visit (INDEPENDENT_AMBULATORY_CARE_PROVIDER_SITE_OTHER): Payer: Commercial Managed Care - PPO

## 2021-10-16 DIAGNOSIS — E038 Other specified hypothyroidism: Secondary | ICD-10-CM

## 2021-10-16 DIAGNOSIS — E063 Autoimmune thyroiditis: Secondary | ICD-10-CM | POA: Diagnosis not present

## 2021-10-16 LAB — T4, FREE: Free T4: 1.31 ng/dL (ref 0.60–1.60)

## 2021-10-16 LAB — TSH: TSH: 0.51 u[IU]/mL (ref 0.35–5.50)

## 2021-10-17 ENCOUNTER — Encounter: Payer: Self-pay | Admitting: Neurology

## 2021-10-17 ENCOUNTER — Ambulatory Visit (INDEPENDENT_AMBULATORY_CARE_PROVIDER_SITE_OTHER): Payer: Commercial Managed Care - PPO | Admitting: Neurology

## 2021-10-17 VITALS — BP 107/74 | HR 81 | Ht 66.0 in | Wt 179.0 lb

## 2021-10-17 DIAGNOSIS — G378 Other specified demyelinating diseases of central nervous system: Secondary | ICD-10-CM | POA: Diagnosis not present

## 2021-10-17 DIAGNOSIS — C50312 Malignant neoplasm of lower-inner quadrant of left female breast: Secondary | ICD-10-CM | POA: Diagnosis not present

## 2021-10-17 DIAGNOSIS — Z79899 Other long term (current) drug therapy: Secondary | ICD-10-CM | POA: Diagnosis not present

## 2021-10-17 DIAGNOSIS — H469 Unspecified optic neuritis: Secondary | ICD-10-CM | POA: Diagnosis not present

## 2021-10-17 DIAGNOSIS — Z170 Estrogen receptor positive status [ER+]: Secondary | ICD-10-CM

## 2021-10-17 NOTE — Progress Notes (Signed)
[facility]  PATIENT: Valerie [name] DOB: 03-Aug-1970  REFERRING DOCTOR OR PCP:  Dr. Curly Rim SOURCE: Patient, notes from recent hospitalization, laboratory results, MRI images personally reviewed.  _________________________________   HISTORICAL  CHIEF COMPLAINT:  Chief Complaint  Patient presents with   Follow-up    Rm 2, alone. Here for 6 month f/u for MOGAD. Pt reports doing better since being on Actemra.     HISTORY OF PRESENT ILLNESS:  Valerie [name], is a 51 y.o. woman with MOGAD and  optic neuritis   Update 10/17/2021 She has been on Actemra and tolerating it well.   She gets free drug through [place].  Vision is doing better now.     She saw ophthalmology Curt Jews) in March and was told VF is back to normal.       She had three episodes of ON, the right December 2021 and the left 05/05/2020 and left January 2023.   The left optic neuritis in 2020 was a worse episode with almost complete blindness.  Vision has improved near 20/20.   Colors are mildly desaturated OS.    She is off the prednisone now.   Heartburn did not improve.   Prilosec helps.   Tomato and acidy drinks worsen it.      She has not had any spinal cord syndrome.  Sometimes she drops items but denies any significant weakness  She denies any change in her gait or balance now but had some issues in January.   She notes no difficulty with strength or sensation.  Bladder function is fine.  She notes occasionally brain fog.   She sleeps well.     She had calcifications on the left breast and had a biopsy.   She was diagnosed with left breast cancer summer 2022 - she did a biopsy, followed by surgery and radiation.Marland Kitchen  Lymph nodes were clean at surgery.   She is on Tamoxifen  (will take x 5 years).    Last mammogram was fineShe has lymphedema  She had Covid 11/2019.   She did not do the vaccination.     HISTORY MOGAD / ON She  had right >> left eye pain at the end of December 2021.   On 04/20/2020 she began to have reduced vision OD and photophobia.    She saw an ophthalmologist who felt she had dry eyes.   She saw Dr. Glenis Smoker the next day who diagnosed her with right optic neuritis.  She was referred to Dr. Baird Cancer who did OCT c/with optic neuritis.   She was admittted 04/24/2020 to 04/29/2020 and received 5 days IV Solu-Medrol.   Vision improved with the steroids.   Upon discharge, she still had reduced visual acuity but good color vision.   She had only mild eye aching and mild photophobia.    On 05/05/2020,  she had aching in the left eye followed by complete loss of vision.  Steroids helped.  I initially placed her on azathioprine but she had trouble tolerating it.  We were able to get her on Rituxan.  She had another visual optic neuritis exacerbation early 2023 prompting switch from Rituxan to Actemra.  Around the time she started March 2023 she noted slight visual blurring and eye temporarily increase her prednisone.  She was able to completely come off of prednisone and May 2023.  Imaging studies: MRI of the orbits, and brain 04/24/2020 showed evidence of right optic neuritis.  The brain was otherwise normal.  MRI of the cervical and thoracic spine 04/24/2020 showed no demyelination.  She does have some mild degenerative changes in the cervical spine.  Data review: Imaging: MRI of the brain 04/24/2020 was normal.  MRI of the orbits 04/24/2020 showed abnormal signal with enhancement in the right optic nerve consistent with optic neuritis.  MRI of the cervical spine and MRI of the thoracic spine 04/24/2020 showed normal spinal cord and some degenerative changes with moderate right foraminal narrowing at C5-C6.  Laboratory data: Labs 04/25/2020:   Anti-MOG antibodies were high titer positive (1: 320).  HIV was negative.   ANA/Sjogrn's antibodies were negative.   RPR was negative,  Anti-NMO Ab was negative,   HgbA1c was normal 5.4.  Sugars ranged 110-183.      REVIEW OF  SYSTEMS: Constitutional: No fevers, chills, sweats, or change in appetite Eyes: No visual changes, double vision, eye pain Ear, nose and throat: No hearing loss, ear pain, nasal congestion, sore throat Cardiovascular: No chest pain, palpitations Respiratory:  No shortness of breath at rest or with exertion.   No wheezes GastrointestinaI: No nausea, vomiting, diarrhea, abdominal pain, fecal incontinence Genitourinary:  No dysuria, urinary retention or frequency.  No nocturia. Musculoskeletal:  No neck pain, back pain Integumentary: No rash, pruritus, skin lesions Neurological: as above Psychiatric: No depression at this time.  No anxiety Endocrine: No palpitations, diaphoresis, change in appetite, change in weigh or increased thirst Hematologic/Lymphatic:  No anemia, purpura, petechiae. Allergic/Immunologic: No itchy/runny eyes, nasal congestion, recent allergic reactions, rashes  ALLERGIES: No Known Allergies  HOME MEDICATIONS:  Current Outpatient Medications:    ascorbic acid (VITAMIN C) 1000 MG tablet, Take 1 tablet by mouth daily., Disp: , Rfl:    cholecalciferol (VITAMIN D3) 25 MCG (1000 UNIT) tablet, Take 5,000 Units by mouth daily., Disp: , Rfl:    levothyroxine (SYNTHROID) 75 MCG tablet, Take 1 tablet by mouth daily., Disp: , Rfl:    Magnesium-Potassium (MAG-K/CHELA-MAX PO), Take by mouth., Disp: , Rfl:    omeprazole (PRILOSEC) 20 MG capsule, Take 20 mg by mouth as needed., Disp: , Rfl:    Probiotic Product (CULTRELLE KIDS IMMUNE DEFENSE PO), Take 1 tablet by mouth daily., Disp: , Rfl:    tamoxifen (NOLVADEX) 10 MG tablet, Take 1 tablet by mouth daily., Disp: , Rfl:    Tocilizumab (ACTEMRA) 162 MG/0.9ML SOSY, For MOGAD, Disp: , Rfl:    UNABLE TO FIND, Take 4 tablets by mouth daily. Med Name: Omega Pure 2 tablet in AM and 2 tablet in PM, Disp: , Rfl:    UNABLE TO FIND, Take by mouth daily. Med Name: Ultra Inflamix 2 scoops daily, Disp: , Rfl:    UNABLE TO FIND, Take 2 tablets by  mouth daily. 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)    Optic neuritis    PONV (postoperative nausea and vomiting)     PAST SURGICAL HISTORY: Past Surgical History:  Procedure Laterality Date   BIOPSY BREAST  10/17/2020   BREAST LUMPECTOMY WITH RADIOACTIVE SEED AND SENTINEL LYMPH NODE BIOPSY Left 11/15/2020   Procedure: LEFT BREAST LUMPECTOMY WITH RADIOACTIVE SEED AND  LEFT SENTINEL LYMPH NODE BIOPSY;  Surgeon: Erroll Luna, MD;  Location: [facility];  Service: General;  Laterality: Left;   TUBAL LIGATION  02/2012   WISDOM TOOTH EXTRACTION      FAMILY HISTORY: Family History  Problem Relation Age of Onset   Hearing loss Mother    Thyroid disease Mother    Cancer Mother 7       endometrial cancer, lynch negative   GER disease Mother    Other Mother        uterine fibroids, optic disc abnormality    Eczema Mother    Colon polyps Mother    Hypothyroidism Mother    Osteoarthritis Mother    Hypertension Mother    Migraines Mother    Colon polyps Father    Hearing loss Father    Other Father        Colon adenoma   Hyperlipidemia Father    GER disease Father    Heart murmur Father    Amblyopia Father        Right eye   Neuropathy Father    Glaucoma Maternal Grandmother    Macular degeneration Maternal Grandmother    Multiple sclerosis Paternal Grandmother    Breast cancer Neg Hx    Colon cancer Neg Hx    Esophageal cancer Neg Hx    Rectal cancer Neg Hx    Stomach cancer Neg Hx     SOCIAL HISTORY:  Social History   Socioeconomic History   Marital status: Married    Spouse name: Not on  file   Number of children: 2   Years of education: BS   Highest education level: Not on file  Occupational History   Occupation: Agricultural consultant   Occupation: Shipping and receiving clerk  Tobacco Use   Smoking status: Never   Smokeless tobacco: Never  Vaping Use   Vaping Use: Never used  Substance and Sexual Activity   Alcohol use: Never   Drug use: Never   Sexual activity: Yes    Birth control/protection: Surgical    Comment: BTL  Other Topics Concern   Not on file  Social History Narrative   Right handed    Caffeine use: rare   Social Determinants of Health   Financial Resource Strain: Low Risk  (03/19/2021)   Overall Financial Resource Strain (CARDIA)    Difficulty of Paying Living Expenses: Not hard at all  Food Insecurity: No Food Insecurity (03/19/2021)   Hunger Vital Sign    Worried About Running Out of Food in the Last Year: Never true    Ran Out of Food in the Last Year: Never true  Transportation Needs: No Transportation Needs (03/19/2021)   PRAPARE - Hydrologist (Medical): No    Lack of Transportation (Non-Medical): No  Physical Activity: Inactive (03/19/2021)   Exercise Vital Sign    Days of Exercise per Week: 0 days    Minutes of Exercise per Session: 0 min  Stress: No Stress Concern Present (03/19/2021)   [place]    Feeling of Stress : Not at all  Social Connections: [place] (03/19/2021)   Social Connection and Isolation Panel [NHANES]    Frequency of Communication with Friends and Family: More than three times a week    Frequency of Social Gatherings with Friends and Family: Three times a week    Attends Religious Services: More than 4 times per year  Active Member of Clubs or Organizations: Yes    Attends Archivist Meetings: 1 to 4 times per year    Marital Status: Married  Human resources officer Violence: Not At Risk (03/19/2021)    Humiliation, Afraid, Rape, and Kick questionnaire    Fear of Current or Ex-Partner: No    Emotionally Abused: No    Physically Abused: No    Sexually Abused: No     PHYSICAL EXAM  Vitals:   10/17/21 1552  BP: 107/74  Pulse: 81  Weight: 179 lb (81.2 kg)  Height: '5\' 6"'$  (1.676 m)    Body mass index is 28.89 kg/m.  No results found.    General: The patient is well-developed and well-nourished and in no acute distress  HEENT:  Head is [place]/AT.  Sclera are anicteric.    Skin: She has lymphedema in the left arm.   Neurologic Exam  Mental status: The patient is alert and oriented x 3 at the time of the examination. The patient has apparent normal recent and remote memory, with an apparently normal attention span and concentration ability.   Speech is normal.  Cranial nerves: Extraocular movements are full.  Visual acuity is 20/20 out of either eye.  She can move her eyes without pain.  She has a 1+ left APD.  Color vision is desaturated slightly OD.  Facial strength and sensation was normal.. No obvious hearing deficits are noted.  Motor:  Muscle bulk is normal.   Tone is normal. Strength is  5 / 5 in all 4 extremities.   Sensory: Sensory testing is intact to pinprick, soft touch and vibration sensation in all 4 extremities.  Coordination: Cerebellar testing reveals good finger-nose-finger and heel-to-shin bilaterally.  Gait and station: Station is normal.   Gait is normal Tandem gait is mildly wide.  Romberg negative.  Reflexes: Deep tendon reflexes are symmetric and normal bilaterally.     DIAGNOSTIC DATA (LABS, IMAGING, TESTING) - I reviewed patient records, labs, notes, testing and imaging myself where available.  Lab Results  Component Value Date   WBC 7.1 06/11/2021   HGB 14.4 06/11/2021   HCT 44.5 06/11/2021   MCV 97 06/11/2021   PLT 221 06/11/2021      Component Value Date/Time   NA 139 06/11/2021 0806   K 4.9 06/11/2021 0806   CL 105 06/11/2021 0806    CO2 22 06/11/2021 0806   GLUCOSE 91 06/11/2021 0806   GLUCOSE 87 10/24/2020 0840   BUN 17 06/11/2021 0806   CREATININE 0.98 06/11/2021 0806   CREATININE 0.97 10/24/2020 0840   CALCIUM 8.7 06/11/2021 0806   PROT 6.2 06/11/2021 0806   ALBUMIN 4.0 06/11/2021 0806   AST 16 06/11/2021 0806   AST 17 10/24/2020 0840   ALT 26 06/11/2021 0806   ALT 16 10/24/2020 0840   ALKPHOS 36 (L) 06/11/2021 0806   BILITOT <0.2 06/11/2021 0806   BILITOT 0.3 10/24/2020 0840   GFRNONAA >60 10/24/2020 0840   Lab Results  Component Value Date   CHOL 172 06/11/2021   HDL 51 06/11/2021   LDLCALC 100 (H) 06/11/2021   TRIG 119 06/11/2021   CHOLHDL 3.4 06/11/2021   Lab Results  Component Value Date   HGBA1C 5.4 04/25/2020       ASSESSMENT AND PLAN  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   Continue Actemra.  We will check CBC with differential, CMP and lipid panel next week (will have to come in the morning for lipid panel)  2.   Stay active.  Vitamins and supplements 3.   Follow-up with Dr. Lindi Adie for breast cancer.   4.   rtc 6 months    Nirvaan Frett A. Felecia Shelling, MD, [facility] 07/19/3401, 7:09 PM Certified in Neurology, Clinical Neurophysiology, Sleep Medicine and Neuroimaging  [facility] [address], [place], [place] [address] [phone]

## 2021-10-21 ENCOUNTER — Other Ambulatory Visit (INDEPENDENT_AMBULATORY_CARE_PROVIDER_SITE_OTHER): Payer: Self-pay

## 2021-10-21 ENCOUNTER — Encounter: Payer: Self-pay | Admitting: *Deleted

## 2021-10-21 ENCOUNTER — Encounter: Payer: Self-pay | Admitting: Neurology

## 2021-10-21 DIAGNOSIS — Z79899 Other long term (current) drug therapy: Secondary | ICD-10-CM

## 2021-10-21 DIAGNOSIS — Z0289 Encounter for other administrative examinations: Secondary | ICD-10-CM

## 2021-10-21 DIAGNOSIS — G378 Other specified demyelinating diseases of central nervous system: Secondary | ICD-10-CM

## 2021-10-22 LAB — COMPREHENSIVE METABOLIC PANEL
ALT: 21 IU/L (ref 0–32)
AST: 18 IU/L (ref 0–40)
Albumin/Globulin Ratio: 1.8 (ref 1.2–2.2)
Albumin: 3.9 g/dL (ref 3.8–4.9)
Alkaline Phosphatase: 30 IU/L — ABNORMAL LOW (ref 44–121)
BUN/Creatinine Ratio: 14 (ref 9–23)
BUN: 14 mg/dL (ref 6–24)
Bilirubin Total: 0.3 mg/dL (ref 0.0–1.2)
CO2: 24 mmol/L (ref 20–29)
Calcium: 8.6 mg/dL — ABNORMAL LOW (ref 8.7–10.2)
Chloride: 108 mmol/L — ABNORMAL HIGH (ref 96–106)
Creatinine, Ser: 0.97 mg/dL (ref 0.57–1.00)
Globulin, Total: 2.2 g/dL (ref 1.5–4.5)
Glucose: 99 mg/dL (ref 70–99)
Potassium: 4.6 mmol/L (ref 3.5–5.2)
Sodium: 144 mmol/L (ref 134–144)
Total Protein: 6.1 g/dL (ref 6.0–8.5)
eGFR: 70 mL/min/{1.73_m2} (ref >59)

## 2021-10-22 LAB — LIPID PANEL
Chol/HDL Ratio: 2.6 ratio (ref 0.0–4.4)
Cholesterol, Total: 137 mg/dL (ref 100–199)
HDL: 53 mg/dL (ref >39)
LDL Chol Calc (NIH): 69 mg/dL (ref 0–99)
Triglycerides: 77 mg/dL (ref 0–149)
VLDL Cholesterol Cal: 15 mg/dL (ref 5–40)

## 2021-10-22 LAB — CBC WITH DIFFERENTIAL/PLATELET
Basophils Absolute: 0 10*3/uL (ref 0.0–0.2)
Basos: 1 %
EOS (ABSOLUTE): 0.3 10*3/uL (ref 0.0–0.4)
Eos: 8 %
Hematocrit: 44.6 % (ref 34.0–46.6)
Hemoglobin: 15.3 g/dL (ref 11.1–15.9)
Immature Grans (Abs): 0 10*3/uL (ref 0.0–0.1)
Immature Granulocytes: 0 %
Lymphocytes Absolute: 1.2 10*3/uL (ref 0.7–3.1)
Lymphs: 32 %
MCH: 33.3 pg — ABNORMAL HIGH (ref 26.6–33.0)
MCHC: 34.3 g/dL (ref 31.5–35.7)
MCV: 97 fL (ref 79–97)
Monocytes Absolute: 0.7 10*3/uL (ref 0.1–0.9)
Monocytes: 18 %
Neutrophils Absolute: 1.6 10*3/uL (ref 1.4–7.0)
Neutrophils: 41 %
Platelets: 164 10*3/uL (ref 150–450)
RBC: 4.59 x10E6/uL (ref 3.77–5.28)
RDW: 11.7 % (ref 11.7–15.4)
WBC: 3.7 10*3/uL (ref 3.4–10.8)

## 2021-11-04 ENCOUNTER — Ambulatory Visit: Payer: Commercial Managed Care - PPO | Attending: Adult Health | Admitting: Rehabilitation

## 2021-11-04 ENCOUNTER — Encounter: Payer: Self-pay | Admitting: Rehabilitation

## 2021-11-04 DIAGNOSIS — C50312 Malignant neoplasm of lower-inner quadrant of left female breast: Secondary | ICD-10-CM | POA: Diagnosis present

## 2021-11-04 DIAGNOSIS — Z170 Estrogen receptor positive status [ER+]: Secondary | ICD-10-CM | POA: Diagnosis present

## 2021-11-04 DIAGNOSIS — Z483 Aftercare following surgery for neoplasm: Secondary | ICD-10-CM | POA: Insufficient documentation

## 2021-11-04 DIAGNOSIS — I890 Lymphedema, not elsewhere classified: Secondary | ICD-10-CM | POA: Insufficient documentation

## 2021-11-04 NOTE — Therapy (Addendum)
 [facility] [address] [place], [place], [address] Phone: [phone]   Fax:  [phone]  Physical Therapy Treatment  Patient Details  Name: Valerie [name] MRN: [id number] Date of Birth: March 20, 1971 Referring Provider (PT): Dr. Debby [name]   Encounter Date: 11/04/2021   PT End of Session - 11/04/21 1158     Visit Number 18    Number of Visits 18    PT Start Time 1200    PT Stop Time 1220    PT Time Calculation (min) 20 min    Activity Tolerance Patient tolerated treatment well    Behavior During Therapy [facility] for tasks assessed/performed             Past Medical History:  Diagnosis Date   Cancer (HCC) 09/2020   left breast   GERD (gastroesophageal reflux disease)    due to prednisone    Hashimoto's disease    History of radiation therapy 2022   completed 01-18-2021   Mitral regurgitation    mild by echo 06/2020   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   Subjective Assessment - 11/04/21 1158     Subjective I need to measure for the pump progress.    Pertinent History She had left lumpectomy on 11/15/2020 with 0/3 lymph nodes removed. She says that she has a problem with passing out with medical things but can let us  know if she feels this might happen.    Currently in Pain? No/denies                   LYMPHEDEMA/ONCOLOGY  QUESTIONNAIRE - 11/04/21 0001       Left Upper Extremity Lymphedema   At Axilla  31.5 cm    15 cm Proximal to Olecranon Process 31.5 cm    10 cm Proximal to Olecranon Process 31.5 cm    Olecranon Process 26.5 cm    15 cm Proximal to Ulnar Styloid Process 24.5 cm    10 cm Proximal to Ulnar Styloid Process 22 cm    Just Proximal to Ulnar Styloid Process 15.8 cm    Across Hand at [facility] 18.8 cm    At [facility] of 2nd Digit 6.4 cm            Per phone call on 11/19/21 pt reports no change in above measurements as well as umbilical trunk circumference of 104.1cm And chest at axilla of 109.4cm     [facility] Adult PT Treatment/Exercise - 11/04/21 0001       Manual Therapy   Manual therapy comments remeasured UE for flexitouch basic pump check in    Edema Management gave pt new chip pack for bra                PT Long Term Goals - 08/19/21 1224       PT LONG TERM GOAL #1  Title pt will be ind with stretches for pectoralis and chest stretching    Time 6    Period Weeks    Status New      PT LONG TERM GOAL #2   Title Pt and husband will be ind with self bandaging and MLD of the Lt UE    Time 6    Period Weeks    Status New      PT LONG TERM GOAL #3   Title Pt will obtain new compression garments for chronic lymphedema    Time 6    Period Weeks    Status New                Plan - 11/04/21 1220     Clinical Impression Statement Pt returns after using basic flexitouch pump up until now with no significant change in measurements.  Pt is still struggling with increased dorsal hand edema throughout the day and chronic breast edema.  Pt will benefit from advanced pump once time requirements are met to address proximal clearance and breast edema and to attempt to decrease measurements in the UE and prevent fibrosis from sustained lymphedema.             Patient will benefit from skilled therapeutic intervention in order to improve the following deficits and  impairments:     Visit Diagnosis: Aftercare following surgery for neoplasm  Malignant neoplasm of lower-inner quadrant of left breast in female, estrogen receptor positive (HCC)  Lymphedema, not elsewhere classified     Problem List Patient Active Problem List   Diagnosis Date Noted   Hypothyroidism due to Hashimoto's thyroiditis 06/07/2021   Malignant neoplasm of lower-inner quadrant of left breast in female, estrogen receptor positive (HCC) 10/23/2020   Optic neuritis, left 10/18/2020   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 [place], [place], [address] Phone: [phone]   Fax:  [phone]  Name: Valerie [name] MRN: [id number] Date of Birth: October 08, 1970  Addendum from phone call on 11/19/21: Pt reports consistent status from previous above visit without measurements change.   Pt continues with chronic lymphedema after at least 4 weeks of compression, elevation, and exercises provided with PT treatment and self management.   Pt demonstrates hyperplasia with increased size and is at risk for hyperkeratosis and fibrosis with untreated lymphedema.    Pt has also failed a basic pump at this time and would benefit from an advanced model to provide proximal clearance and a multi chambered approach  Pt also demonstrates truncal edema unmanaged by current pump.  Valerie [name], PT 11/22/21  PHYSICAL THERAPY DISCHARGE SUMMARY  Visits from Start of Care: 18  Current functional level related to goals / functional outcomes: See above   Remaining deficits: Chronic lymphedema   Education / Equipment: Final self care plan  Plan: Patient  agrees to discharge.  Patient is being discharged due to meeting the stated rehab goals.      *Addendum 11/03/23: Pt was measured today for new compression garments for her left UE lymphedema post breast cancer treatment.  She has a long history of elevation and decrease of lymph fluid levels in the arm which is demonstrated by SOZO screenings  fluctuating from green to yellow to red and back at various time periods.  She is considered stage 1 lymphedema and has trouble fitting well into off the shelf products in the past so she will get a new custom medi 350 class 2 x 2 sleeve and matching glove.

## 2021-11-07 ENCOUNTER — Encounter: Payer: Self-pay | Admitting: Hematology and Oncology

## 2021-11-07 ENCOUNTER — Encounter: Payer: Self-pay | Admitting: Neurology

## 2021-11-07 ENCOUNTER — Other Ambulatory Visit: Payer: Self-pay | Admitting: *Deleted

## 2021-11-07 ENCOUNTER — Other Ambulatory Visit: Payer: Self-pay

## 2021-11-07 DIAGNOSIS — M7989 Other specified soft tissue disorders: Secondary | ICD-10-CM

## 2021-11-07 NOTE — Progress Notes (Signed)
Labs entered for [facility] visit.  

## 2021-11-08 ENCOUNTER — Ambulatory Visit (HOSPITAL_COMMUNITY)
Admission: RE | Admit: 2021-11-08 | Discharge: 2021-11-08 | Disposition: A | Discharge status: Home / Self Care | Payer: Commercial Managed Care - PPO | Source: Ambulatory Visit | Attending: Hematology and Oncology | Admitting: Hematology and Oncology

## 2021-11-08 ENCOUNTER — Inpatient Hospital Stay (HOSPITAL_BASED_OUTPATIENT_CLINIC_OR_DEPARTMENT_OTHER): Payer: Commercial Managed Care - PPO | Admitting: Physician Assistant

## 2021-11-08 ENCOUNTER — Other Ambulatory Visit: Payer: Self-pay

## 2021-11-08 ENCOUNTER — Ambulatory Visit: Payer: Commercial Managed Care - PPO | Admitting: Family Medicine

## 2021-11-08 ENCOUNTER — Inpatient Hospital Stay: Payer: Commercial Managed Care - PPO | Attending: Hematology and Oncology

## 2021-11-08 VITALS — BP 125/79 | HR 78 | Temp 98.3°F | Resp 16 | Wt 182.0 lb

## 2021-11-08 DIAGNOSIS — M7989 Other specified soft tissue disorders: Secondary | ICD-10-CM

## 2021-11-08 DIAGNOSIS — M79671 Pain in right foot: Secondary | ICD-10-CM | POA: Diagnosis not present

## 2021-11-08 DIAGNOSIS — Z170 Estrogen receptor positive status [ER+]: Secondary | ICD-10-CM

## 2021-11-08 DIAGNOSIS — M79672 Pain in left foot: Secondary | ICD-10-CM | POA: Insufficient documentation

## 2021-11-08 DIAGNOSIS — C50312 Malignant neoplasm of lower-inner quadrant of left female breast: Secondary | ICD-10-CM | POA: Diagnosis not present

## 2021-11-08 DIAGNOSIS — Z79810 Long term (current) use of selective estrogen receptor modulators (SERMs): Secondary | ICD-10-CM | POA: Diagnosis not present

## 2021-11-08 DIAGNOSIS — R2242 Localized swelling, mass and lump, left lower limb: Secondary | ICD-10-CM | POA: Diagnosis not present

## 2021-11-08 DIAGNOSIS — Z923 Personal history of irradiation: Secondary | ICD-10-CM | POA: Diagnosis not present

## 2021-11-08 LAB — CMP (CANCER CENTER ONLY)
ALT: 26 U/L (ref 0–44)
AST: 20 U/L (ref 15–41)
Albumin: 3.7 g/dL (ref 3.5–5.0)
Alkaline Phosphatase: 26 U/L — ABNORMAL LOW (ref 38–126)
Anion gap: 2 — ABNORMAL LOW (ref 5–15)
BUN: 16 mg/dL (ref 6–20)
CO2: 30 mmol/L (ref 22–32)
Calcium: 8.8 mg/dL — ABNORMAL LOW (ref 8.9–10.3)
Chloride: 109 mmol/L (ref 98–111)
Creatinine: 0.88 mg/dL (ref 0.44–1.00)
GFR, Estimated: >60 mL/min (ref >60)
Glucose, Bld: 93 mg/dL (ref 70–99)
Potassium: 4.3 mmol/L (ref 3.5–5.1)
Sodium: 141 mmol/L (ref 135–145)
Total Bilirubin: 0.4 mg/dL (ref 0.3–1.2)
Total Protein: 5.9 g/dL — ABNORMAL LOW (ref 6.5–8.1)

## 2021-11-08 LAB — CBC WITH DIFFERENTIAL (CANCER CENTER ONLY)
Abs Immature Granulocytes: 0.01 10*3/uL (ref 0.00–0.07)
Basophils Absolute: 0 10*3/uL (ref 0.0–0.1)
Basophils Relative: 0 %
Eosinophils Absolute: 0.4 10*3/uL (ref 0.0–0.5)
Eosinophils Relative: 7 %
HCT: 41.6 % (ref 36.0–46.0)
Hemoglobin: 14.2 g/dL (ref 12.0–15.0)
Immature Granulocytes: 0 %
Lymphocytes Relative: 19 %
Lymphs Abs: 1.1 10*3/uL (ref 0.7–4.0)
MCH: 32.9 pg (ref 26.0–34.0)
MCHC: 34.1 g/dL (ref 30.0–36.0)
MCV: 96.3 fL (ref 80.0–100.0)
Monocytes Absolute: 0.8 10*3/uL (ref 0.1–1.0)
Monocytes Relative: 14 %
Neutro Abs: 3.5 10*3/uL (ref 1.7–7.7)
Neutrophils Relative %: 60 %
Platelet Count: 176 10*3/uL (ref 150–400)
RBC: 4.32 MIL/uL (ref 3.87–5.11)
RDW: 12.1 % (ref 11.5–15.5)
WBC Count: 5.8 10*3/uL (ref 4.0–10.5)
nRBC: 0 % (ref 0.0–0.2)

## 2021-11-08 LAB — MAGNESIUM: Magnesium: 1.6 mg/dL — ABNORMAL LOW (ref 1.7–2.4)

## 2021-11-08 MED ORDER — FUROSEMIDE 20 MG PO TABS: 20.0000 mg | ORAL_TABLET | Freq: Every day | ORAL | 0 refills | Status: DC

## 2021-11-08 NOTE — Progress Notes (Signed)
Symptom Management Consult note [place]    Patient Care Team: Tawnya Crook, MD as PCP - General (Family Medicine) Sueanne Margarita, MD as PCP - Cardiology (Cardiology) Erroll Luna, MD as Consulting Physician (General Surgery) Nicholas Lose, MD as Consulting Physician (Hematology and Oncology) Kyung Rudd, MD as Consulting Physician (Radiation Oncology) Britt Bottom, MD (Neurology)    Name of the patient: Valerie [name]  [id number]  18-May-1970   Date of visit: 11/08/2021    Chief complaint/ Reason for visit-bilateral leg swelling  Oncology History  Malignant neoplasm of lower-inner quadrant of left breast in female, estrogen receptor positive ([place])  10/23/2020 Initial Diagnosis   Screening mammogram on 09/11/20 showed possible distortion with calcifications in the left breast. Diagnostic mammogram and [place] on 10/03/20 showed 0.6 cm group of indeterminate lower inner left breast calcifications with possible associated subtle distortion and no abnormal appearing left axillary lymph nodes. Biopsy on 10/17/20 showed invasive ductal carcinoma, DCIS with calcifications, ER+(90%)/PR(95%)/Ki67(<5%).   10/24/2020 Cancer Staging   Staging form: Breast, AJCC 8th Edition - Clinical stage from 10/24/2020: Stage IA (cT1b, cN0, cM0, G1, ER+, PR+, HER2-) - Signed by Nicholas Lose, MD on 10/24/2020 Stage prefix: Initial diagnosis Histologic grading system: 3 grade system Laterality: Left Staged by: Pathologist and managing physician Stage used in treatment planning: Yes National guidelines used in treatment planning: Yes Type of national guideline used in treatment planning: NCCN   11/15/2020 Surgery   Left lumpectomy: No residual cancer, fibrocystic change, margins negative, 0/3 lymph nodes negative (based on the biopsy tumor size: 2 mm), previous ER 90%, PR 95%, HER2 negative, Ki-67 less than 5%   12/25/2020 - 01/18/2021 Radiation Therapy   Adjuvant radiation    02/21/2021 -  Anti-estrogen oral therapy   Tamoxifen     Current Therapy: Tamoxifen started in November 2022, plan for 5 years    Interval history- Valerie [name] is a 51 y.o. with oncologic history as above presenting to [facility] today with chief complaint of bilateral leg swelling x 1 week.  She states the swelling worsens throughout the day and typically looks improved in the mornings.  She states she first noticed the swelling in her ankles approximately 1 month ago however states it was intermittent so she was not as concerned.  She reports yesterday the swelling was worse than usual specifically around her ankles and in her feet.  She states when she woke up this morning the swelling had improved.  The swelling is worse after the workday.  She stands for 8 hours a day on a concrete floor at work. She is also endorsing mild joint pain and pain in her feet.  She suspects this is related to the tamoxifen.  She feels like she can still manage her ADLs despite the pain.  She reports the pain in her feet is mostly located in her heel.  She does have a history of plantar fasciitis and wonders if that might be happening now.  She recently went to the store to get more supportive shoes and inserts.  She states those have slightly helped the pain in her feet however it is still present.  She denies treatment for plantar fasciitis in the past stating that it went away on its own.  She denies any fall or injury.  No over-the-counter occasions tried prior to arrival today.  Denies any fever, chills, chest pain, shortness of breath.      ROS  All other systems  are reviewed and are negative for acute change except as noted in the HPI.    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)    Optic neuritis    PONV (postoperative nausea and vomiting)      Past Surgical History:  Procedure Laterality Date   BIOPSY BREAST  10/17/2020   BREAST LUMPECTOMY WITH RADIOACTIVE SEED AND SENTINEL LYMPH NODE BIOPSY Left 11/15/2020   Procedure: LEFT BREAST LUMPECTOMY WITH RADIOACTIVE SEED AND LEFT SENTINEL LYMPH NODE BIOPSY;  Surgeon: Erroll Luna, MD;  Location: [place];  Service: General;  Laterality: Left;   TUBAL LIGATION  02/2012   WISDOM TOOTH EXTRACTION      Social History   Socioeconomic History   Marital status: Married    Spouse name: Not on file   Number of children: 2   Years of education: BS   Highest education level: Not on file  Occupational History   Occupation: Agricultural consultant   Occupation: Shipping and receiving clerk  Tobacco Use   Smoking status: Never   Smokeless tobacco: Never  Vaping Use   Vaping Use: Never used  Substance and Sexual Activity   Alcohol use: Never   Drug use: Never   Sexual activity: Yes    Birth control/protection: Surgical    Comment: BTL  Other Topics Concern   Not on file  Social History Narrative   Right handed    Caffeine use: rare   Social Determinants of Health   Financial Resource Strain: Low Risk  (03/19/2021)   Overall Financial Resource Strain (CARDIA)    Difficulty of Paying Living Expenses: Not hard at all  Food Insecurity: No Food Insecurity (03/19/2021)   Hunger Vital Sign    Worried About Running Out of Food in the Last Year: Never true    Ran Out of Food in the Last Year: Never true  Transportation Needs: No Transportation Needs (03/19/2021)   PRAPARE - Hydrologist (Medical): No    Lack of Transportation (Non-Medical): No  Physical Activity: Inactive (03/19/2021)   Exercise Vital Sign    Days of Exercise per Week: 0 days    Minutes of Exercise per  Session: 0 min  Stress: No Stress Concern Present (03/19/2021)   [place]    Feeling of Stress : Not at all  Social Connections: [place] (03/19/2021)   Social Connection and Isolation Panel [NHANES]    Frequency of Communication with Friends and Family: More than three times a week    Frequency of Social Gatherings with Friends and Family: Three times a week    Attends Religious Services: More than 4 times per year    Active Member of Clubs or Organizations: Yes    Attends Archivist Meetings: 1 to 4 times per year    Marital Status: Married  Human resources officer Violence: Not At Risk (03/19/2021)   Humiliation, Afraid, Rape, and Kick questionnaire    Fear of Current or Ex-Partner: No    Emotionally Abused: No    Physically Abused: No    Sexually Abused: No  Family History  Problem Relation Age of Onset   Hearing loss Mother    Thyroid disease Mother    Cancer Mother 63       endometrial cancer, lynch negative   GER disease Mother    Other Mother        uterine fibroids, optic disc abnormality    Eczema Mother    Colon polyps Mother    Hypothyroidism Mother    Osteoarthritis Mother    Hypertension Mother    Migraines Mother    Colon polyps Father    Hearing loss Father    Other Father        Colon adenoma   Hyperlipidemia Father    GER disease Father    Heart murmur Father    Amblyopia Father        Right eye   Neuropathy Father    Glaucoma Maternal Grandmother    Macular degeneration Maternal Grandmother    Multiple sclerosis Paternal Grandmother    Breast cancer Neg Hx    Colon cancer Neg Hx    Esophageal cancer Neg Hx    Rectal cancer Neg Hx    Stomach cancer Neg Hx      Current Outpatient Medications:    furosemide (LASIX) 20 MG tablet, Take 1 tablet (20 mg total) by mouth daily for 7 days., Disp: 7 tablet, Rfl: 0   ascorbic acid (VITAMIN C) 1000 MG tablet, Take 1 tablet  by mouth daily., Disp: , Rfl:    cholecalciferol (VITAMIN D3) 25 MCG (1000 UNIT) tablet, Take 5,000 Units by mouth daily., Disp: , Rfl:    levothyroxine (SYNTHROID) 88 MCG tablet, Take 88 mcg by mouth daily before breakfast., Disp: , Rfl:    Magnesium-Potassium (MAG-K/CHELA-MAX PO), Take by mouth., Disp: , Rfl:    omeprazole (PRILOSEC) 20 MG capsule, Take 20 mg by mouth as needed., Disp: , Rfl:    Probiotic Product (CULTRELLE KIDS IMMUNE DEFENSE PO), Take 1 tablet by mouth daily., Disp: , Rfl:    tamoxifen (NOLVADEX) 10 MG tablet, Take 1 tablet by mouth daily., Disp: , Rfl:    Tocilizumab (ACTEMRA) 162 MG/0.9ML SOSY, For MOGAD, Disp: , Rfl:    UNABLE TO FIND, Take 4 tablets by mouth daily. Med Name: Omega Pure 2 tablet in AM and 2 tablet in PM, Disp: , Rfl:    UNABLE TO FIND, Take by mouth daily. Med Name: Ultra Inflamix 2 scoops daily, Disp: , Rfl:    UNABLE TO FIND, Take 2 tablets by mouth daily. Med Name: R-Lipoic Acid, Disp: , Rfl:   PHYSICAL EXAM: ECOG FS:1 - Symptomatic but completely ambulatory    Vitals:   11/08/21 1123  BP: 125/79  Pulse: 78  Resp: 16  Temp: 98.3 F (36.8 C)  TempSrc: Oral  SpO2: 94%  Weight: 182 lb (82.6 kg)   Physical Exam Vitals and nursing note reviewed.  Constitutional:      Appearance: She is well-developed. She is not ill-appearing or toxic-appearing.  HENT:     Head: Normocephalic and atraumatic.     Nose: Nose normal.  Eyes:     General: No scleral icterus.       Right eye: No discharge.        Left eye: No discharge.     Conjunctiva/sclera: Conjunctivae normal.  Neck:     Vascular: No JVD.  Cardiovascular:     Rate and Rhythm: Normal rate and regular rhythm.     Pulses: Normal pulses.  Dorsalis pedis pulses are 2+ on the right side and 2+ on the left side.     Heart sounds: Normal heart sounds.  Pulmonary:     Effort: Pulmonary effort is normal.     Breath sounds: Normal breath sounds.  Abdominal:     General: There is no  distension.  Musculoskeletal:        General: Normal range of motion.     Cervical back: Normal range of motion.     Right lower leg: 1+ Pitting Edema present.     Left lower leg: 1+ Pitting Edema present.     Comments: Compartments are soft in bilateral lower extremities.  Bilateral lower extremities are neurovascular intact.  Skin:    General: Skin is warm and dry.     Comments: Equal tactile temperature in bilateral lower extremities.  Neurological:     Mental Status: She is oriented to person, place, and time.     GCS: GCS eye subscore is 4. GCS verbal subscore is 5. GCS motor subscore is 6.     Comments: Fluent speech, no facial droop.  Psychiatric:        Behavior: Behavior normal.        LABORATORY DATA: I have reviewed the data as listed    Latest Ref Rng & Units 11/08/2021   10:16 AM 10/21/2021    8:08 AM 06/11/2021    8:06 AM  CBC  WBC 4.0 - 10.5 K/uL 5.8  3.7  7.1   Hemoglobin 12.0 - 15.0 g/dL 14.2  15.3  14.4   Hematocrit 36.0 - 46.0 % 41.6  44.6  44.5   Platelets 150 - 400 K/uL 176  164  221         Latest Ref Rng & Units 11/08/2021   10:16 AM 10/21/2021    8:08 AM 06/11/2021    8:06 AM  CMP  Glucose 70 - 99 mg/dL 93  99  91   BUN 6 - 20 mg/dL $Remove'16  14  17   'SyUbWmT$ Creatinine 0.44 - 1.00 mg/dL 0.88  0.97  0.98   Sodium 135 - 145 mmol/L 141  144  139   Potassium 3.5 - 5.1 mmol/L 4.3  4.6  4.9   Chloride 98 - 111 mmol/L 109  108  105   CO2 22 - 32 mmol/L $RemoveB'30  24  22   'pWAUdWZg$ Calcium 8.9 - 10.3 mg/dL 8.8  8.6  8.7   Total Protein 6.5 - 8.1 g/dL 5.9  6.1  6.2   Total Bilirubin 0.3 - 1.2 mg/dL 0.4  0.3  <0.2   Alkaline Phos 38 - 126 U/L 26  30  36   AST 15 - 41 U/L $Remo'20  18  16   'GSkkF$ ALT 0 - 44 U/L $Remo'26  21  26        'GksBZ$ RADIOGRAPHIC STUDIES (from last 24 hours if applicable) I have personally reviewed the radiological images as listed and agreed with the findings in the report. VAS [place] LOWER EXTREMITY VENOUS (DVT)  Result Date: 11/08/2021  Lower Venous DVT Study Patient Name:   TANASHIA CIESLA  Date of Exam:   11/08/2021 Medical Rec #: [id number]               Accession #:    [id number] Date of Birth: [birth year]               Patient Gender: F Patient Age:   27 years Exam Location:  [facility]  Procedure:      VAS [place] LOWER EXTREMITY VENOUS (DVT) Referring Phys: Nicholas Lose --------------------------------------------------------------------------------  Indications: Swelling.  Risk Factors: Cancer. Comparison Study: No prior studies. Performing Technologist: Oliver Hum RVT  Examination Guidelines: A complete evaluation includes B-mode imaging, spectral Doppler, color Doppler, and power Doppler as needed of all accessible portions of each vessel. Bilateral testing is considered an integral part of a complete examination. Limited examinations for reoccurring indications may be performed as noted. The reflux portion of the exam is performed with the patient in reverse Trendelenburg.  +---------+---------------+---------+-----------+----------+--------------+ RIGHT    CompressibilityPhasicitySpontaneityPropertiesThrombus Aging +---------+---------------+---------+-----------+----------+--------------+ CFV      Full           Yes      Yes                                 +---------+---------------+---------+-----------+----------+--------------+ SFJ      Full                                                        +---------+---------------+---------+-----------+----------+--------------+ FV Prox  Full                                                        +---------+---------------+---------+-----------+----------+--------------+ FV Mid   Full                                                        +---------+---------------+---------+-----------+----------+--------------+ FV DistalFull                                                        +---------+---------------+---------+-----------+----------+--------------+ PFV      Full                                                         +---------+---------------+---------+-----------+----------+--------------+ POP      Full           Yes      Yes                                 +---------+---------------+---------+-----------+----------+--------------+ PTV      Full                                                        +---------+---------------+---------+-----------+----------+--------------+ PERO     Full                                                        +---------+---------------+---------+-----------+----------+--------------+   +---------+---------------+---------+-----------+----------+--------------+  LEFT     CompressibilityPhasicitySpontaneityPropertiesThrombus Aging +---------+---------------+---------+-----------+----------+--------------+ CFV      Full           Yes      Yes                                 +---------+---------------+---------+-----------+----------+--------------+ SFJ      Full                                                        +---------+---------------+---------+-----------+----------+--------------+ FV Prox  Full                                                        +---------+---------------+---------+-----------+----------+--------------+ FV Mid   Full                                                        +---------+---------------+---------+-----------+----------+--------------+ FV DistalFull                                                        +---------+---------------+---------+-----------+----------+--------------+ PFV      Full                                                        +---------+---------------+---------+-----------+----------+--------------+ POP      Full           Yes      Yes                                 +---------+---------------+---------+-----------+----------+--------------+ PTV      Full                                                         +---------+---------------+---------+-----------+----------+--------------+ PERO     Full                                                        +---------+---------------+---------+-----------+----------+--------------+     Summary: RIGHT: - There is no evidence of deep vein thrombosis in the lower extremity.  - No cystic structure found in the popliteal fossa.  LEFT: - There is no evidence of deep vein thrombosis in the lower extremity.  - No  cystic structure found in the popliteal fossa.  *See table(s) above for measurements and observations. Electronically signed by Jamelle Haring on 11/08/2021 at 2:09:55 PM.    Final        ASSESSMENT & PLAN: Patient is a 51 y.o. female  with oncologic history of malignant neoplasm of lower-inner quadrant of left breast, ER positive followed by Dr. Drucilla Schmidt.  I have viewed most recent oncology note and lab work.   #) Leg swelling-patient with bilateral 1+ pitting edema.  No overlying signs of infection and bilateral lower extremities are neurovascular intact.  DVT study is negative.  Suspect symptoms are related to patient standing for long periods of time.  I discussed symptomatic care including elevation and compression stockings.  Engaged in shared decision making with patient who is agreeable for short course of Lasix to help with swelling.  Patient advised to follow-up with PCP.  Discussed strict ED precautions should symptoms worsen.  #) Malignant neoplasm of lower-inner quadrant of left breast, ER positive- Next appointment with oncologist is 10/13/2022.  Patient knows to call cancer center if her joint pain worsens while on the tamoxifen or if she feels she is unable to tolerate it any longer.   Visit Diagnosis: 1. Malignant neoplasm of lower-inner quadrant of left breast in female, estrogen receptor positive ([place])   2. Localized swelling of lower extremity      No orders of the defined types were placed in this encounter.   All questions  were answered. The patient knows to call the clinic with any problems, questions or concerns. No barriers to learning was detected.  I have spent a total of 30 minutes minutes of face-to-face and non-face-to-face time, preparing to see the patient, obtaining and/or reviewing separately obtained history, performing a medically appropriate examination, counseling and educating the patient, ordering tests, documenting clinical information in the electronic health record, and care coordination (communications with other health care professionals or caregivers).    Thank you for allowing me to participate in the care of this patient.    Barrie Folk, PA-C Department of Hematology/Oncology [facility] at [facility] Phone: [phone]  [phone]    11/08/2021 5:31 PM

## 2021-11-08 NOTE — Progress Notes (Signed)
Bilateral lower extremity venous duplex has been completed. Preliminary results can be found in CV Proc through chart review.  Results were given to [facility] at Dr. Geralyn Flash office.  11/08/21 9:49 AM Valerie [name] RVT

## 2021-11-11 ENCOUNTER — Encounter: Payer: Self-pay | Admitting: Family Medicine

## 2021-11-13 ENCOUNTER — Encounter: Payer: Self-pay | Admitting: Rehabilitation

## 2021-11-15 ENCOUNTER — Ambulatory Visit (INDEPENDENT_AMBULATORY_CARE_PROVIDER_SITE_OTHER): Payer: Commercial Managed Care - PPO | Admitting: Family Medicine

## 2021-11-15 ENCOUNTER — Encounter: Payer: Self-pay | Admitting: Family Medicine

## 2021-11-15 VITALS — BP 102/74 | HR 78 | Temp 97.8°F | Resp 16 | Ht 66.0 in | Wt 179.5 lb

## 2021-11-15 DIAGNOSIS — K219 Gastro-esophageal reflux disease without esophagitis: Secondary | ICD-10-CM

## 2021-11-15 DIAGNOSIS — R1319 Other dysphagia: Secondary | ICD-10-CM | POA: Diagnosis not present

## 2021-11-15 DIAGNOSIS — R600 Localized edema: Secondary | ICD-10-CM

## 2021-11-15 MED ORDER — OMEPRAZOLE 20 MG PO CPDR: 20.0000 mg | DELAYED_RELEASE_CAPSULE | Freq: Every day | ORAL | 1 refills | Status: DC

## 2021-11-15 MED ORDER — FUROSEMIDE 20 MG PO TABS: 20.0000 mg | ORAL_TABLET | Freq: Every day | ORAL | 1 refills | Status: DC

## 2021-11-15 NOTE — Progress Notes (Signed)
Subjective:     Patient ID: Valerie [name], female    DOB: [birth year], 51 y.o.   MRN: [id number]  Chief Complaint  Patient presents with   Referral    Referral to GI     HPI  GERD-Long h/o GERD-takes omeprazole intermitt. Was on pred for 1.5 yrs. No EGD ever. Mild HH on CT scan. Has some throat clearing. Occ dysphagia bottom of throat. Solids.  Can cough while eating.  saw onc and mentioned some intermitt dysphagia-onc rec referral to GI.  No blood in stools.  No n/v.  No pain Lyphedema LUE from breast ca-has pump-not working.  Ankle edema-lasix helps-intermitt.  Got compression stockings recently. When lies down at hs, coughs few times.   There are no preventive care reminders to display for this patient.   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)    Optic neuritis    PONV (postoperative nausea and vomiting)     Past Surgical History:  Procedure Laterality Date   BIOPSY BREAST  10/17/2020   BREAST LUMPECTOMY WITH RADIOACTIVE SEED AND SENTINEL LYMPH NODE BIOPSY Left 11/15/2020   Procedure: LEFT BREAST LUMPECTOMY WITH RADIOACTIVE SEED AND LEFT SENTINEL LYMPH NODE BIOPSY;  Surgeon: Erroll Luna, MD;  Location: [place];  Service: General;  Laterality: Left;   TUBAL LIGATION  02/2012   WISDOM TOOTH EXTRACTION      Outpatient Medications Prior to Visit  Medication Sig Dispense Refill   ascorbic acid (VITAMIN C) 1000 MG tablet Take 1 tablet by mouth daily.     cholecalciferol (VITAMIN D3) 25 MCG (1000 UNIT) tablet Take 5,000 Units by mouth daily.     furosemide (LASIX) 20 MG tablet Take 1 tablet (20 mg total) by mouth daily for 7 days. 7 tablet 0    levothyroxine (SYNTHROID) 88 MCG tablet Take 88 mcg by mouth daily before breakfast.     Magnesium-Potassium (MAG-K/CHELA-MAX PO) Take by mouth.     omeprazole (PRILOSEC) 20 MG capsule Take 20 mg by mouth as needed.     Probiotic Product (CULTRELLE KIDS IMMUNE DEFENSE PO) Take 1 tablet by mouth daily.     tamoxifen (NOLVADEX) 10 MG tablet Take 1 tablet by mouth daily.     Tocilizumab (ACTEMRA) 162 MG/0.9ML SOSY For MOGAD     UNABLE TO FIND Take 4 tablets by mouth daily. Med Name: Omega Pure 2 tablet in AM and 2 tablet in PM     UNABLE TO FIND Take by mouth daily. Med Name: Ultra Inflamix 2 scoops daily     UNABLE TO FIND Take 2 tablets by mouth daily. Med Name: R-Lipoic Acid     No facility-administered medications prior to visit.    No Known Allergies ROS neg/noncontributory except as noted HPI/below      Objective:     BP 102/74   Pulse 78   Temp 97.8 F (36.6 C) (Temporal)   Resp 16   Ht '5\' 6"'$  (1.676 m)   Wt 179 lb 8 oz (81.4 kg)   LMP 10/15/2021 (Approximate)   SpO2 96%   BMI 28.97 kg/m  Wt Readings from Last 3 Encounters:  11/15/21 179 lb  8 oz (81.4 kg)  11/08/21 182 lb (82.6 kg)  10/17/21 179 lb (81.2 kg)    Physical Exam   Gen: WDWN NAD HEENT: NCAT, conjunctiva not injected, sclera nonicteric NECK:  supple, no thyromegaly, no nodes, no carotid bruits CARDIAC: RRR, S1S2+, no murmur. DP 2+B LUNGS: CTAB. No wheezes ABDOMEN:  BS+, soft, NTND, No HSM, no masses EXT:  tr edema MSK: no gross abnormalities.  NEURO: A&O x3.  CN II-XII intact.  PSYCH: normal mood. Good eye contact  Reviewed labs and echo(06/2020)    Assessment & Plan:   Problem List Items Addressed This Visit   None Visit Diagnoses     Gastroesophageal reflux disease without esophagitis    -  Primary   Other dysphagia       Local edema          GERD-chronic, but not taking omeprazole daily.  Will do daily omeprazole '20mg'$ .  Educated of ER if stuck.  Refer GI as has dysphagia as well.  And  cough Localized edema-ankles.  Compression stockings.  Protein sl low.  Pt not want to take meds.  As has occ cough(may be GERD or cardiac).  Lasix '20mg'$  daily for 3 days and see if cough better as well.  If so, echo.    No orders of the defined types were placed in this encounter.   Wellington Hampshire, MD

## 2021-11-15 NOTE — Patient Instructions (Signed)
It was very nice to see you today!  If cough improves after 3 days of lasix, let me know and will order echo.  Take omeprazole daily.  Referral placed to GI.    PLEASE NOTE:  If you had any lab tests please let us know if you have not heard back within a few days. You may see your results on MyChart before we have a chance to review them but we will give you a call once they are reviewed by [place]. If we ordered any referrals today, please let us know if you have not heard from their office within the next week.   Please try these tips to maintain a healthy lifestyle:  Eat most of your calories during the day when you are active. Eliminate processed foods including packaged sweets (pies, cakes, cookies), reduce intake of potatoes, white bread, white pasta, and white rice. Look for whole grain options, oat flour or almond flour.  Each meal should contain half fruits/vegetables, one quarter protein, and one quarter carbs (no bigger than a computer mouse).  Cut down on sweet beverages. This includes juice, soda, and sweet tea. Also watch fruit intake, though this is a healthier sweet option, it still contains natural sugar! Limit to 3 servings daily.  Drink at least 1 glass of water with each meal and aim for at least 8 glasses per day  Exercise at least 150 minutes every week.

## 2021-11-18 ENCOUNTER — Other Ambulatory Visit: Payer: Self-pay | Admitting: *Deleted

## 2021-11-18 DIAGNOSIS — R600 Localized edema: Secondary | ICD-10-CM

## 2021-11-19 ENCOUNTER — Encounter: Payer: Self-pay | Admitting: Rehabilitation

## 2021-11-22 ENCOUNTER — Encounter: Payer: Self-pay | Admitting: Rehabilitation

## 2021-11-28 ENCOUNTER — Ambulatory Visit (INDEPENDENT_AMBULATORY_CARE_PROVIDER_SITE_OTHER): Payer: Commercial Managed Care - PPO

## 2021-11-28 ENCOUNTER — Encounter: Payer: Self-pay | Admitting: Neurology

## 2021-11-28 DIAGNOSIS — R931 Abnormal findings on diagnostic imaging of heart and coronary circulation: Secondary | ICD-10-CM

## 2021-11-28 DIAGNOSIS — R600 Localized edema: Secondary | ICD-10-CM | POA: Diagnosis not present

## 2021-11-28 HISTORY — DX: Abnormal findings on diagnostic imaging of heart and coronary circulation: R93.1

## 2021-11-28 HISTORY — PX: TRANSTHORACIC ECHOCARDIOGRAM: SHX275

## 2021-11-28 LAB — ECHOCARDIOGRAM COMPLETE
Area-P 1/2: 5.54 cm2
S' Lateral: 2.71 cm

## 2021-12-02 ENCOUNTER — Other Ambulatory Visit: Payer: Self-pay | Admitting: Family Medicine

## 2021-12-02 ENCOUNTER — Telehealth: Payer: Self-pay | Admitting: Family Medicine

## 2021-12-02 DIAGNOSIS — R931 Abnormal findings on diagnostic imaging of heart and coronary circulation: Secondary | ICD-10-CM

## 2021-12-02 NOTE — Telephone Encounter (Signed)
Patient informed that pcp has not had the chance to look over results and make recommendations. Patient advised that she will get a call when they have been viewed. Patient verbalized understanding.

## 2021-12-02 NOTE — Telephone Encounter (Signed)
Patient states: -She had echocardiogram completed on 11/28/21 and is concerned about results  Patient requests: - Further explanation from PCP and next steps based on results

## 2021-12-03 NOTE — Telephone Encounter (Signed)
Noted  

## 2021-12-05 ENCOUNTER — Ambulatory Visit (INDEPENDENT_AMBULATORY_CARE_PROVIDER_SITE_OTHER): Payer: Commercial Managed Care - PPO | Admitting: Internal Medicine

## 2021-12-05 ENCOUNTER — Encounter: Payer: Self-pay | Admitting: Internal Medicine

## 2021-12-05 VITALS — BP 128/88 | HR 79 | Ht 66.0 in | Wt 180.8 lb

## 2021-12-05 DIAGNOSIS — E038 Other specified hypothyroidism: Secondary | ICD-10-CM | POA: Diagnosis not present

## 2021-12-05 DIAGNOSIS — E063 Autoimmune thyroiditis: Secondary | ICD-10-CM | POA: Diagnosis not present

## 2021-12-05 DIAGNOSIS — E663 Overweight: Secondary | ICD-10-CM

## 2021-12-05 NOTE — Patient Instructions (Signed)
Please continue Levothyroxine 88 mcg daily.  Take the thyroid hormone every day, with water, at least 30 minutes before breakfast, separated by at least 4 hours from: - acid reflux medications - calcium - iron - multivitamins  Please stop at the lab.  Please come back for a follow-up appointment in 1 year but for labs, in 6 months.

## 2021-12-05 NOTE — Progress Notes (Signed)
Patient ID: Valerie [name], female   DOB: [birth year], 51 y.o.   MRN: [id number]  HPI  Valerie [name] is a 51 y.o.-year-old female, returning for follow-up for Hashimoto's hypothyroidism.  Last visit 6 months ago. She previously saw Dr. Buddy Duty.  Interim history: She was on 5 mg of prednisone daily >> stopped ~2 mo ago.  She send me a message since last visit but she started on an anti-inflammatory diet (no dairy and gluten). She had a recurrence of her Lymphedema in 08/2021.  She also had plantar fasciitis in L foot in 08/2021.  She got shoe inserts - pain improved. She developed B leg swelling more recently. No blood clots. She was started on diuretic.  She had an abnormal 2d ECHO. She will see cardiology.  Reviewed and addended history: Pt. has been dx with hypothyroidism in 1996 during infertility evaluation (while living in [place]). She had 2 successful pregnancies since then, after starting on thyroid hormone therapy.   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  She sees Dr. Felecia Shelling at [facility] neurology.  She tried rituximab and azathioprine, but these did not work.  She was on steroids higher doses, and now on Actemra.  When I first saw her in 05/2021, she was not taking levothyroxine correctly.  After she started to take it correctly, we had to gradually reduce the dose.  She is on Levothyroxine 88 mcg daily (latest dose decrease 08/2021): - fasting, at 5 am - prev. along with tamoxifen >> we moved to tamoxifen with breakfast in 05/2021 - with water - separated by 2h from b'fast  - no iron, PPIs + Hormone Balance multivitamins  - with 150 mcg Biotin  - 1-1.5h later- per [facility] >> we moved this later in the day in 05/2021.  I reviewed pt's thyroid tests: Lab Results  Component Value Date   TSH 0.51 10/16/2021   TSH 0.08 (L) 08/29/2021   TSH 0.14 (L) 07/26/2021   TSH 8.470 (H) 06/11/2021   FREET4 1.31  10/16/2021   FREET4 1.20 08/29/2021   FREET4 1.29 07/26/2021   FREET4 1.39 06/11/2021   T3FREE 2.3 06/11/2021   Previously: 10/08/2020: TSH 1.48 08/24/2020: TSH 7.5 07/04/2020: TSH 16.47 10/28/2019: TSH 2.59 08/11/2019: TSH 1.49 06/13/2019: TSH 0.16 (0.34-4.5)  2019-06-13     FT4 1.22   0.61-1.12  TSH   2019-01-05    TSH 1.26   0.34-4.50  TSH   2018-10-22    TSH 7.25   0.34-4.50  TSH   2018-09-08    TSH 22.28   0.34-4.50  Free T4   2018-09-08    FT4 0.63   0.61-1.12  T3, Free   2018-09-08    FT3 2.25   2.50-3.90  TSH   2018-07-21    TSH 2.81   0.34-4.50  TSH   2018-06-07    TSH 5.88   0.34-4.50  Free T4   2018-06-07    FT4 1.06   0.61-1.12  T3, Free   2018-06-07    FT3 2.60   2.50-3.90   Thyroid Panel With TSH ([id number])   2018-06-07    Thyroxine (T4) 8.4   4.5-12.0  T3 Uptake 31   24-39  Free Thyroxine Index 2.6   1.2-4.9  TSH   2018-02-23    TSH 1.07   0.34-4.50  TSH   2017-11-19    TSH 2.43   0.34-4.50  TSH   2017-09-10    TSH 0.26   0.34-4.50  Free T4   2017-09-10    FT4 1.00   0.61-1.12  TSH   2017-07-17    TSH 0.12   0.34-4.50  Free T4   2017-07-17    FT4 1.00   0.61-1.12   Antithyroid antibodies:    2018-06-07    Thyroid Peroxidase (TPO) Ab 52   0-34  Thyroglobulin Antibody <1.0   0.0-0.9   At last visit, she described: - weight gain - fatigue - insomnia - hot flushes  She continues to have hot flashes. Also, she has a hard time losing weight.  Pt denies feeling nodules in neck, hoarseness, dysphagia/odynophagia.  She has + FH of thyroid disorders in: M. No FH of thyroid cancer.  No h/o radiation tx to head or neck. No recent use of iodine supplements.  Reviewing Dr. Cindra Eves notes, she had investigation for hirsutism/adrenal disorders in the past: Cortisol, Salivary X2, Timed ([id number])   2017-06-08    #1 Salivary Cortisol 0.032      #2 Salivary Cortisol 0.022       Testosterone   2017-06-02    TESTO 45.5   8.0-48.0  Vitamin B12   2017-06-02     B12 1400   180-914  Vitamin D 25(OH) Total   2017-06-02    17-OH Progesterone LCMS ([id number])   2017-06-02    Androstenedione LCMS ([id number])   2017-06-02    DHEA-Sulfate ([id number])   2017-06-02    DHEA-Sulfate 181.2   41.2-243.7  IGF-1 ([id number])   2017-06-02    Insulin-Like Growth Factor I 134   57-195  Metanephrines, Frac., Pl. Free [phone])   2017-06-02    Metanephrine, Pl 23   0-62  Normetanephrine, Pl 132   0-145   Pt. also has a history of breast cancer, vitamin D deficiency.  For exercise, she is stretching 5 times a week, also walking/biking occasionally.  ROS:+ see HPI  Past Medical History:  Diagnosis Date   Cancer ([place]) 09/2020   left breast   GERD (gastroesophageal reflux disease)    due to prednisone   Hashimoto's disease    History of radiation therapy 2022   completed 01-18-2021   Mitral regurgitation    mild by echo 06/2020   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)    Optic neuritis    PONV (postoperative nausea and vomiting)    Past Surgical History:  Procedure Laterality Date   BIOPSY BREAST  10/17/2020   BREAST LUMPECTOMY WITH RADIOACTIVE SEED AND SENTINEL LYMPH NODE BIOPSY Left 11/15/2020   Procedure: LEFT BREAST LUMPECTOMY WITH RADIOACTIVE SEED AND LEFT SENTINEL LYMPH NODE BIOPSY;  Surgeon: Erroll Luna, MD;  Location: [facility];  Service: General;  Laterality: Left;   TUBAL LIGATION  02/2012   WISDOM TOOTH EXTRACTION     Social History   Socioeconomic History   Marital status: Married    Spouse name: Not on file   Number of children: 2   Years of education: BS   Highest education level: Not on file  Occupational History   Occupation: Agricultural consultant   Occupation: Shipping and receiving clerk  Tobacco Use   Smoking status: Never   Smokeless tobacco: Never  Vaping Use   Vaping Use: Never used  Substance and Sexual Activity   Alcohol use: Never   Drug use: Never   Sexual activity: Yes    Birth  control/protection: Surgical    Comment: BTL  Other Topics Concern   Not on file  Social History Narrative  Right handed    Caffeine use: rare   Social Determinants of Health   Financial Resource Strain: Low Risk  (03/19/2021)   Overall Financial Resource Strain (CARDIA)    Difficulty of Paying Living Expenses: Not hard at all  Food Insecurity: No Food Insecurity (03/19/2021)   Hunger Vital Sign    Worried About Running Out of Food in the Last Year: Never true    Ran Out of Food in the Last Year: Never true  Transportation Needs: No Transportation Needs (03/19/2021)   PRAPARE - Hydrologist (Medical): No    Lack of Transportation (Non-Medical): No  Physical Activity: Inactive (03/19/2021)   Exercise Vital Sign    Days of Exercise per Week: 0 days    Minutes of Exercise per Session: 0 min  Stress: No Stress Concern Present (03/19/2021)   [place]    Feeling of Stress : Not at all  Social Connections: [place] (03/19/2021)   Social Connection and Isolation Panel [NHANES]    Frequency of Communication with Friends and Family: More than three times a week    Frequency of Social Gatherings with Friends and Family: Three times a week    Attends Religious Services: More than 4 times per year    Active Member of Clubs or Organizations: Yes    Attends Archivist Meetings: 1 to 4 times per year    Marital Status: Married  Human resources officer Violence: Not At Risk (03/19/2021)   Humiliation, Afraid, Rape, and Kick questionnaire    Fear of Current or Ex-Partner: No    Emotionally Abused: No    Physically Abused: No    Sexually Abused: No   Current Outpatient Medications on File Prior to Visit  Medication Sig Dispense Refill   ascorbic acid (VITAMIN C) 1000 MG tablet Take 1 tablet by mouth daily.     cholecalciferol (VITAMIN D3) 25 MCG (1000 UNIT) tablet Take 5,000 Units by  mouth daily.     furosemide (LASIX) 20 MG tablet Take 1 tablet (20 mg total) by mouth daily. 30 tablet 1   levothyroxine (SYNTHROID) 88 MCG tablet Take 88 mcg by mouth daily before breakfast.     Magnesium-Potassium (MAG-K/CHELA-MAX PO) Take by mouth.     omeprazole (PRILOSEC) 20 MG capsule Take 1 capsule (20 mg total) by mouth daily. 90 capsule 1   Probiotic Product (CULTRELLE KIDS IMMUNE DEFENSE PO) Take 1 tablet by mouth daily.     tamoxifen (NOLVADEX) 10 MG tablet Take 1 tablet by mouth daily.     Tocilizumab (ACTEMRA) 162 MG/0.9ML SOSY For MOGAD     UNABLE TO FIND Take 4 tablets by mouth daily. Med Name: Omega Pure 2 tablet in AM and 2 tablet in PM     UNABLE TO FIND Take by mouth daily. Med Name: Ultra Inflamix 2 scoops daily     UNABLE TO FIND Take 2 tablets by mouth daily. Med Name: R-Lipoic Acid     No current facility-administered medications on file prior to visit.   No Known Allergies Family History  Problem Relation Age of Onset   Hearing loss Mother    Thyroid disease Mother    Cancer Mother 54       endometrial cancer, lynch negative   GER disease Mother    Other Mother        uterine fibroids, optic disc abnormality    Eczema Mother    Colon  polyps Mother    Hypothyroidism Mother    Osteoarthritis Mother    Hypertension Mother    Migraines Mother    Colon polyps Father    Hearing loss Father    Other Father        Colon adenoma   Hyperlipidemia Father    GER disease Father    Heart murmur Father    Amblyopia Father        Right eye   Neuropathy Father    Glaucoma Maternal Grandmother    Macular degeneration Maternal Grandmother    Multiple sclerosis Paternal Grandmother    Breast cancer Neg Hx    Colon cancer Neg Hx    Esophageal cancer Neg Hx    Rectal cancer Neg Hx    Stomach cancer Neg Hx    PE: BP 128/88 (BP Location: Right Arm, Patient Position: Sitting, Cuff Size: Normal)   Pulse 79   Ht '5\' 6"'$  (1.676 m)   Wt 180 lb 12.8 oz (82 kg)   LMP  10/15/2021 (Approximate)   SpO2 98%   BMI 29.18 kg/m  Wt Readings from Last 3 Encounters:  12/05/21 180 lb 12.8 oz (82 kg)  11/15/21 179 lb 8 oz (81.4 kg)  11/08/21 182 lb (82.6 kg)   Constitutional: overweight, in NAD Eyes: no exophthalmos ENT: moist mucous membranes, no masses palpated in neck, no cervical lymphadenopathy Cardiovascular: RRR, No MRG Respiratory: CTA B Musculoskeletal: no deformities Skin: moist, warm, no rashes Neurological: no tremor with outstretched hands  ASSESSMENT: 1. Hypothyroidism  2.  Overweight  PLAN:  1. Patient with longstanding hypothyroidism, on levothyroxine therapy. - No neck compression symptoms.  She remembered having had a thyroid ultrasound in the past without significant findings - latest thyroid labs reviewed with pt. >> normal recently: Lab Results  Component Value Date   TSH 0.51 10/16/2021  - she continues on LT4 88 mcg daily - pt feels good on this dose.  She does have heat intolerance, which is not new.  Possibly related to tamoxifen. - we discussed about taking the thyroid hormone every day, with water, >30 minutes before breakfast, separated by >4 hours from acid reflux medications, calcium, iron, multivitamins. Pt. is taking it correctly.  Last visit, she was taking levothyroxine along with tamoxifen and I advised her to move the tamoxifen later.  She did so since last visit.  However, she also was taking a hormonal support supplement including 560 mg of calcium to close to levothyroxine.  I advised her to move this later in the day.  After these changes, her TSH became suppressed so we had to gradually decrease the dose of her levothyroxine. - will check thyroid tests today: TSH and fT4 - If labs are abnormal, she will need to return for repeat TFTs in 1.5 months -We will see her back in 1 year, but in 6 months for labs.  2.  Overweight - she lost 2 lbs since last OV -She is frustrated about the fact that she is not losing  weight, despite stopping the steroid. -She is wondering about Mancel Parsons - discussed that this is an option for her, as it can be prescribed in patients without diabetes. -Kirke Shaggy is another option  -I advised her that I am not usually doing weight management, but she can discuss with PCP about these.  A referral to a weight management clinic is another option.  She just refilled the 88 mcg dose.  Component     Latest Ref Rng 12/05/2021  T4,Free(Direct)     0.60 - 1.60 ng/dL 0.99   TSH     0.35 - 5.50 uIU/mL 0.32 (L)     TSH is slightly low.  We will advised her to back up the dose to 75 mcg daily.  For this, I will have her take the 88 mcg tablets 6 out of 7 days.  We will need to repeat her labs in 1.5 months.  Philemon Kingdom, MD PhD [facility]

## 2021-12-06 ENCOUNTER — Encounter: Payer: Self-pay | Admitting: Internal Medicine

## 2021-12-06 LAB — T4, FREE: Free T4: 0.99 ng/dL (ref 0.60–1.60)

## 2021-12-06 LAB — TSH: TSH: 0.32 u[IU]/mL — ABNORMAL LOW (ref 0.35–5.50)

## 2021-12-06 MED ORDER — LEVOTHYROXINE SODIUM 75 MCG PO TABS
75.0000 ug | ORAL_TABLET | Freq: Every day | ORAL | 3 refills | Status: DC
Start: 2021-12-06 — End: 2022-04-16

## 2021-12-08 ENCOUNTER — Other Ambulatory Visit: Payer: Self-pay | Admitting: Family Medicine

## 2021-12-08 NOTE — Telephone Encounter (Signed)
Needs bmp within 1 wk as taking it daily and potassium may go low

## 2021-12-25 ENCOUNTER — Encounter: Payer: Self-pay | Admitting: Adult Health

## 2021-12-25 ENCOUNTER — Encounter: Payer: Self-pay | Admitting: Rehabilitation

## 2021-12-25 ENCOUNTER — Encounter (HOSPITAL_BASED_OUTPATIENT_CLINIC_OR_DEPARTMENT_OTHER): Payer: Self-pay | Admitting: Cardiology

## 2021-12-25 ENCOUNTER — Ambulatory Visit (INDEPENDENT_AMBULATORY_CARE_PROVIDER_SITE_OTHER): Payer: Commercial Managed Care - PPO | Admitting: Cardiology

## 2021-12-25 VITALS — BP 110/82 | HR 76 | Ht 66.0 in | Wt 180.0 lb

## 2021-12-25 DIAGNOSIS — Z7189 Other specified counseling: Secondary | ICD-10-CM

## 2021-12-25 DIAGNOSIS — R931 Abnormal findings on diagnostic imaging of heart and coronary circulation: Secondary | ICD-10-CM | POA: Diagnosis not present

## 2021-12-25 DIAGNOSIS — Z712 Person consulting for explanation of examination or test findings: Secondary | ICD-10-CM

## 2021-12-25 DIAGNOSIS — R600 Localized edema: Secondary | ICD-10-CM

## 2021-12-25 DIAGNOSIS — R0609 Other forms of dyspnea: Secondary | ICD-10-CM | POA: Diagnosis not present

## 2021-12-25 DIAGNOSIS — R059 Cough, unspecified: Secondary | ICD-10-CM

## 2021-12-25 NOTE — Patient Instructions (Signed)
Medication Instructions:  Your Physician recommend you continue on your current medication as directed.    *If you need a refill on your cardiac medications before your next appointment, please call your pharmacy*   Lab Work: None ordered today   Testing/Procedures: None ordered today   Follow-Up: At [facility], you and your health needs are our priority.  As part of our continuing mission to provide you with exceptional heart care, we have created designated Provider Care Teams.  These Care Teams include your primary Cardiologist (physician) and Advanced Practice Providers (APPs -  Physician Assistants and Nurse Practitioners) who all work together to provide you with the care you need, when you need it.  We recommend signing up for the patient portal called "MyChart".  Sign up information is provided on this After Visit Summary.  MyChart is used to connect with patients for Virtual Visits (Telemedicine).  Patients are able to view lab/test results, encounter notes, upcoming appointments, etc.  Non-urgent messages can be sent to your provider as well.   To learn more about what you can do with MyChart, go to [email or web address].    Your next appointment:   As needed  The format for your next appointment:   In Person  Provider:   Bridgette Christopher, MD           

## 2021-12-25 NOTE — Progress Notes (Signed)
Cardiology Office Note:    Date:  12/25/2021   ID:  Claudius Sis, DOB [birth year], MRN [id number]  PCP:  Tawnya Crook, MD  Cardiologist:  Buford Dresser, MD  Referring MD: Tawnya Crook, MD   CC: new patient consultation for the evaluation of abnormal echocardiogram.  History of Present Illness:    Valerie [name] is a 51 y.o. female with a hx of mitral regurgitation, GERD, Hashimoto's disease, and cancer, who is seen as a new consult at the request of Tawnya Crook, MD for the evaluation and management of abnormal echocardiogram.  Note from 11/15/21 from Dr. Cherlynn Kaiser reviewed. Noted history of ankle edema, cough when she lies down. Lasix helped symptoms. Echo ordered, noted change in her strain pattern, referred for further evaluation.  Today: She appears to be doing well. Her medical history within the past couple of years was reviewed. Her echocardiogram results were discussed at length. We personally reviewed her echo images today. Overall strain is borderline normal--overall tracking is ok but not perfect.  She has been feeling winded more easily lately. She bikes, walks, and lifts weights for exercise, but she can become winded, as well as very hot. She states that she has lost consciousness during exercise in the past, which she believes is related to becoming overheated.   Additionally, she reports that sometimes walking up the stairs can make her heart race. She will occasionally also notice a pronounced heart beat when she is just sitting down.   Of note, she has noticed a cough recently, and details that it will sometimes occur when she is lying down.   With her MOGAD, she states she has had times where she has completely lost hearing/vision before.   She reports that she bruises and bleeds easily.  She has been trying to lose weight and received some floor exercises from her physical therapist to try. She expresses that she would like to exercise  more, but that she becomes so hot during it, which makes it very tiring and difficult.   She denies any or chest pain. No lightheadedness, headaches, syncope, orthopnea, or PND.   Her dad and her 67 year old brother both have heart murmurs. Her maternal aunt recently had a stent placed for her CAD with full blockage. Her maternal grandfather and grandmother also had some heart issues.  ROS cough of unclear etiology. LE edema improved from prior.  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)    Optic neuritis    PONV (postoperative nausea and vomiting)     Past Surgical History:  Procedure Laterality Date   BIOPSY BREAST  10/17/2020   BREAST LUMPECTOMY WITH RADIOACTIVE SEED AND SENTINEL LYMPH NODE BIOPSY Left 11/15/2020   Procedure: LEFT BREAST LUMPECTOMY WITH RADIOACTIVE SEED AND LEFT SENTINEL LYMPH NODE BIOPSY;  Surgeon: Erroll Luna, MD;  Location: [facility];  Service: General;  Laterality: Left;   TUBAL LIGATION  02/2012   WISDOM TOOTH EXTRACTION      Current Medications: Current Outpatient Medications on File Prior to Visit  Medication Sig   ascorbic acid (VITAMIN C) 1000 MG tablet Take 1 tablet by mouth daily.  cholecalciferol (VITAMIN D3) 25 MCG (1000 UNIT) tablet Take 5,000 Units by mouth daily.   furosemide (LASIX) 20 MG tablet TAKE 1 TABLET BY MOUTH EVERY DAY   levothyroxine (SYNTHROID) 75 MCG tablet Take 1 tablet (75 mcg total) by mouth daily before breakfast.   Magnesium-Potassium (MAG-K/CHELA-MAX PO) Take by mouth.   omeprazole (PRILOSEC) 20 MG capsule Take 1 capsule (20 mg total) by mouth daily.   Probiotic Product (CULTRELLE KIDS IMMUNE DEFENSE PO) Take 1  tablet by mouth daily.   tamoxifen (NOLVADEX) 10 MG tablet Take 1 tablet by mouth daily.   Tocilizumab (ACTEMRA) 162 MG/0.9ML SOSY For MOGAD   UNABLE TO FIND Take 4 tablets by mouth daily. Med Name: Omega Pure 2 tablet in AM and 2 tablet in PM   UNABLE TO FIND Take by mouth daily. Med Name: Ultra Inflamix 2 scoops daily   UNABLE TO FIND Take 2 tablets by mouth daily. Med Name: R-Lipoic Acid   No current facility-administered medications on file prior to visit.     Allergies:   Patient has no known allergies.   Social History   Tobacco Use   Smoking status: Never   Smokeless tobacco: Never  Vaping Use   Vaping Use: Never used  Substance Use Topics   Alcohol use: Never   Drug use: Never    Family History: family history includes Amblyopia in her father; Cancer (age of onset: 30) in her mother; Colon polyps in her father and mother; Eczema in her mother; GER disease in her father and mother; Glaucoma in her maternal grandmother; Hearing loss in her father and mother; Heart murmur in her father; Hyperlipidemia in her father; Hypertension in her mother; Hypothyroidism in her mother; Macular degeneration in her maternal grandmother; Migraines in her mother; Multiple sclerosis in her paternal grandmother; Neuropathy in her father; Osteoarthritis in her mother; Other in her father and mother; Thyroid disease in her mother. There is no history of Breast cancer, Colon cancer, Esophageal cancer, Rectal cancer, or Stomach cancer.  ROS:   Please see the history of present illness.   (+) Shortness of breath (+) Bilateral LE edema (+) Occasional cough  Additional pertinent ROS otherwise negative.    EKGs/Labs/Other Studies Reviewed:    The following studies were reviewed today:  Echo 11/28/2021: 1. Compared with the echo 06/2020, GLS was -21.3. It is now -16.0, which  is worse. Systolic function is unchanged. Left ventricular ejection  fraction, by estimation, is 60 to 65%. The left  ventricle has normal  function. The left ventricle has no regional   wall motion abnormalities. There is mild left ventricular hypertrophy of  the basal-septal segment. Left ventricular diastolic parameters were  normal. The average left ventricular global longitudinal strain is -16.0  %. The global longitudinal strain is  abnormal.   2. Right ventricular systolic function is normal. The right ventricular  size is normal. There is normal pulmonary artery systolic pressure.   3. The mitral valve is normal in structure. Trivial mitral valve  regurgitation. No evidence of mitral stenosis.   4. The aortic valve is tricuspid. Aortic valve regurgitation is not  visualized. No aortic stenosis is present.   5. The inferior vena cava is normal in size with greater than 50%  respiratory variability, suggesting right atrial pressure of 3 mmHg.    Bilateral LE Venous Doppler 11/08/2021: Summary:  RIGHT:  - There is no evidence of deep vein thrombosis in the lower extremity.     - No cystic  structure found in the popliteal fossa.     LEFT:  - There is no evidence of deep vein thrombosis in the lower extremity.     - No cystic structure found in the popliteal fossa.    CT Coronary Morph 07/13/2020: FINDINGS: The visualized portions of the lower lung fields show no suspicious nodules, masses, or infiltrates. No pleural fluid seen.   The visualized portions of the mediastinum and chest wall are unremarkable. A small hiatal hernia is noted.   IMPRESSION: Small hiatal hernia. No other significant non-cardiovascular abnormality in visualized portion of the thorax.   EKG:  EKG is personally reviewed.   12/25/21: NSR at 76 bpm  Recent Labs: 11/08/2021: ALT 26; BUN 16; Creatinine 0.88; Hemoglobin 14.2; Magnesium 1.6; Platelet Count 176; Potassium 4.3; Sodium 141 12/05/2021: TSH 0.32  Recent Lipid Panel    Component Value Date/Time   CHOL 137 10/21/2021 0808   TRIG 77 10/21/2021 0808   HDL 53  10/21/2021 0808   CHOLHDL 2.6 10/21/2021 0808   LDLCALC 69 10/21/2021 0808    Physical Exam:    VS:  BP 110/82 (BP Location: Right Arm, Patient Position: Sitting, Cuff Size: Large)   Pulse 76   Ht '5\' 6"'$  (1.676 m)   Wt 180 lb (81.6 kg)   BMI 29.05 kg/m     Wt Readings from Last 3 Encounters:  01/08/22 179 lb (81.2 kg)  01/02/22 179 lb 6 oz (81.4 kg)  12/25/21 180 lb (81.6 kg)    GEN: Well nourished, well developed in no acute distress HEENT: Normal, moist mucous membranes NECK: No JVD CARDIAC: regular rhythm, normal S1 and S2, no rubs or gallops. No murmur. VASCULAR: Radial and DP pulses 2+ bilaterally. No carotid bruits RESPIRATORY:  Clear to auscultation without rales, wheezing or rhonchi  ABDOMEN: Soft, non-tender, non-distended MUSCULOSKELETAL:  Ambulates independently SKIN: Warm and dry, left arm lymphedema. Trivial LE edema NEUROLOGIC:  Alert and oriented x 3. No focal neuro deficits noted. PSYCHIATRIC:  Normal affect    ASSESSMENT:    1. Abnormal echocardiogram   2. DOE (dyspnea on exertion)   3. Encounter to discuss test results   4. Cough, unspecified type   5. Bilateral leg edema   6. Cardiac risk counseling    PLAN:    Abnormal echocardiogram Dyspnea on exertion Cough Intermittent bilateral LE edema -we reviewed her echo results at length today. I personally looked at the images. The strain tracking isn't perfect, and I would suspect that the strain is within normal limits. No other significant abnormalities. Diastolic function is normal. -I suspect her cough is not cardiac in etiology. She is on PPI -we discussed compression stockings, elevation, salt avoidance for LE edema -would use lasix as needed for LE edema, likely does not need routinely   Cardiac risk counseling and prevention recommendations: -recommend heart healthy/Mediterranean diet, with whole grains, fruits, vegetable, fish, lean meats, nuts, and olive oil. Limit salt. -recommend moderate  walking, 3-5 times/week for 30-50 minutes each session. Aim for at least 150 minutes.week. Goal should be pace of 3 miles/hours, or walking 1.5 miles in 30 minutes -recommend avoidance of tobacco products. Avoid excess alcohol. -ASCVD risk score: The 10-year ASCVD risk score (Arnett DK, et al., 2019) is: 1%   Values used to calculate the score:     Age: 9 years     Sex: Female     Is Non-Hispanic African American: No     Diabetic: No     Tobacco smoker:  No     Systolic Blood Pressure: 301 mmHg     Is BP treated: No     HDL Cholesterol: 53 mg/dL     Total Cholesterol: 137 mg/dL    Plan for follow up: I would be happy to see her back as needed  Buford Dresser, MD, PhD, [facility]    Medication Adjustments/Labs and Tests Ordered: Current medicines are reviewed at length with the patient today.  Concerns regarding medicines are outlined above.  Orders Placed This Encounter  Procedures   EKG 12-Lead   No orders of the defined types were placed in this encounter.   Patient Instructions  Medication Instructions:  Your Physician recommend you continue on your current medication as directed.    *If you need a refill on your cardiac medications before your next appointment, please call your pharmacy*   Lab Work: None ordered today   Testing/Procedures: None ordered today   Follow-Up: At [facility], you and your health needs are our priority.  As part of our continuing mission to provide you with exceptional heart care, we have created designated Provider Care Teams.  These Care Teams include your primary Cardiologist (physician) and Advanced Practice Providers (APPs -  Physician Assistants and Nurse Practitioners) who all work together to provide you with the care you need, when you need it.  We recommend signing up for the patient portal called "MyChart".  Sign up information is provided on this After Visit Summary.  MyChart is used to  connect with patients for Virtual Visits (Telemedicine).  Patients are able to view lab/test results, encounter notes, upcoming appointments, etc.  Non-urgent messages can be sent to your provider as well.   To learn more about what you can do with MyChart, go to [email or web address].    Your next appointment:   As needed  The format for your next appointment:   In Person  Provider:   Buford Dresser, MD            I,Breanna Adamick,acting as a scribe for Buford Dresser, MD.,have documented all relevant documentation on the behalf of Buford Dresser, MD,as directed by  Buford Dresser, MD while in the presence of Buford Dresser, MD.   I, Buford Dresser, MD, have reviewed all documentation for this visit. The documentation on 01/13/22 for the exam, diagnosis, procedures, and orders are all accurate and complete.   Signed, Buford Dresser, MD PhD 12/25/2021     [place]

## 2022-01-02 ENCOUNTER — Encounter: Payer: Self-pay | Admitting: Nurse Practitioner

## 2022-01-02 ENCOUNTER — Encounter: Payer: Self-pay | Admitting: Neurology

## 2022-01-02 ENCOUNTER — Ambulatory Visit: Payer: Commercial Managed Care - PPO | Admitting: Nurse Practitioner

## 2022-01-02 DIAGNOSIS — R1310 Dysphagia, unspecified: Secondary | ICD-10-CM | POA: Diagnosis not present

## 2022-01-02 DIAGNOSIS — K219 Gastro-esophageal reflux disease without esophagitis: Secondary | ICD-10-CM

## 2022-01-02 NOTE — Progress Notes (Signed)
Chief Complaint:  heartburn, swallowing problems.    Assessment &  Plan   # 51 yo female with heartburn, throat clearing and solid food dysphagia .  Symptoms started when she required prolonged steroids for myelin oligodendrocyte glycoprotein antibody disorder.  Off steroids since late May but with persistent GI symptoms.  No oral Candida on exam.  Symptoms may be GERD with peptic stricture.  She could also have dysmotility of the esophagus Swallowing precautions discussed. Advised to eat slowly, chew food well before swallowing. Drink  liquids in between each bite to avoid food impaction. Schedule for EGD. The risks and benefits of EGD with possible biopsies were discussed with the patient who agrees to proceed.   # Myelin oligodendrocyte glycoprotein antibody disorder.  Treated with prolonged course of steroids, none since May.  Now maintained on Actemra.    HPI   Valerie [name] is a 51 y.o. female known to Dr.  Lorenso Courier with a past medical history significant for hypothyroidism, GERD, breast cancer, sessile serrated colon polyp, myelin oligodendrocyte glycoprotein antibody disorder .See PMH /PSH for additional history   Valerie [name] had a screening colonoscopy here in May 2023  Interval History:  She is here today for evaluation of dysphagia and throat clearing. Symptoms started in 2022 while taking A LOT of steroids. Last does of prednisone was in late May.  Has a lot of phlegm in throat.  She isn't sure if she has post-nasal drip. Some acidic food  / citrus foods give her heartburn. Does her best to avoid culprit food. Taking Omeprazole but only QOD . Feels like it helps heartburn but she doesn't want to take it ( takes too many meds). She also has problems with certain food such as hamburger or berries getting "stuck " in throat. Water helps push the food down slowly.   She doesn't have pain with swallowing and her weight is stable.   Followed by Neurology. Myelin oligodendrocyte  glycoprotein antibody disorder started with severe visual disturbances.  Azathioupurine didn't work, Rituximab didn't work. She tried something else but that didn't work, now on Actermra injections.   Labs:     Latest Ref Rng & Units 11/08/2021   10:16 AM 10/21/2021    8:08 AM 06/11/2021    8:06 AM  CBC  WBC 4.0 - 10.5 K/uL 5.8  3.7  7.1   Hemoglobin 12.0 - 15.0 g/dL 14.2  15.3  14.4   Hematocrit 36.0 - 46.0 % 41.6  44.6  44.5   Platelets 150 - 400 K/uL 176  164  221        Latest Ref Rng & Units 11/08/2021   10:16 AM 10/21/2021    8:08 AM 06/11/2021    8:06 AM  Hepatic Function  Total Protein 6.5 - 8.1 g/dL 5.9  6.1  6.2   Albumin 3.5 - 5.0 g/dL 3.7  3.9  4.0   AST 15 - 41 U/L 20  18  16    ALT 0 - 44 U/L 26  21  26    Alk Phosphatase 38 - 126 U/L 26  30  36   Total Bilirubin 0.3 - 1.2 mg/dL 0.4  0.3  <0.2   Bilirubin, Direct 0.00 - 0.40 mg/dL   0.11      Past Medical History:  Diagnosis Date   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     Current Outpatient Medications  Medication Sig Dispense Refill    ascorbic acid (VITAMIN C) 1000 MG tablet Take 1 tablet by mouth daily.     cholecalciferol (VITAMIN D3) 25 MCG (1000 UNIT) tablet Take 5,000 Units by mouth daily.     furosemide (LASIX) 20 MG tablet Take 20 mg by mouth daily as needed. For weight gain 3 lbs overnight or 5 lbs in a week, or for significant leg swelling.     levothyroxine (SYNTHROID) 75 MCG tablet Take 1 tablet (75 mcg total) by mouth daily before breakfast. 90 tablet 3   MAGNESIUM GLUCONATE PO Take 2 tablets by mouth daily at 12 noon.     omeprazole (PRILOSEC) 20 MG capsule Take 1 capsule (20 mg total) by mouth daily. (Patient taking differently: Take 20 mg by mouth every other day.) 90 capsule 1   Probiotic Product (CULTRELLE KIDS IMMUNE DEFENSE PO) Take 1 tablet by mouth daily.     tamoxifen (NOLVADEX) 10 MG tablet Take 1 tablet by mouth daily.     Tocilizumab (ACTEMRA) 162 MG/0.9ML SOSY For MOGAD     UNABLE TO FIND Take 4 tablets by mouth daily. Med Name: Omega Pure 2 tablet in AM and 2 tablet in PM     UNABLE TO FIND Take by mouth daily. Med Name: Ultra Inflamix 2 scoops daily     UNABLE TO FIND Take 2 tablets by mouth daily. Med Name: R-Lipoic Acid     No current facility-administered medications for this visit.    Review of Systems: No chest pain. No shortness of breath. No urinary complaints.    Physical Exam  Wt Readings from Last 3 Encounters:  12/25/21 180 lb (81.6 kg)  12/05/21 180 lb 12.8 oz (82 kg)  11/15/21 179 lb 8 oz (81.4 kg)    BP 94/66 (BP Location: Right Arm, Patient Position: Sitting, Cuff Size: Normal)   Pulse 84   Ht 5' 5.25" (1.657 m) Comment: height measured without shoes  Wt 179 lb 6 oz (81.4 kg)   LMP 12/24/2021   BMI 29.62 kg/m  Constitutional:  Generally well appearing female in no acute distress. Psychiatric: Pleasant. Normal mood and affect. Behavior is normal. EENT: Pupils normal.  Conjunctivae are normal. No scleral icterus. No candida seen Neck supple.  Cardiovascular: Normal  rate, regular rhythm. No edema Pulmonary/chest: Effort normal and breath sounds normal. No wheezing, rales or rhonchi. Abdominal: Soft, nondistended, nontender. Bowel sounds active throughout. There are no masses palpable. No hepatomegaly. Neurological: Alert and oriented to person place and time. Skin: Skin is warm and dry. No rashes noted.  Tye Savoy, NP  01/02/2022, 7:54 AM

## 2022-01-02 NOTE — Patient Instructions (Addendum)
If you are age 52 or younger, your body mass index should be between 19-25. Your Body mass index is 29.62 kg/m. If this is out of the aformentioned range listed, please consider follow up with your Primary Care Provider.  ________________________________________________________  The  GI providers would like to encourage you to use [facility] to communicate with providers for non-urgent requests or questions.  Due to long hold times on the telephone, sending your provider a message by [facility] may be a faster and more efficient way to get a response.  Please allow 48 business hours for a response.  Please remember that this is for non-urgent requests.  _______________________________________________________ Dennis Bast have been scheduled for an endoscopy. Please follow written instructions given to you at your visit today. If you use inhalers (even only as needed), please bring them with you on the day of your procedure.  Due to recent changes in healthcare laws, you may see the results of your imaging and laboratory studies on MyChart before your provider has had a chance to review them.  We understand that in some cases there may be results that are confusing or concerning to you. Not all laboratory results come back in the same time frame and the provider may be waiting for multiple results in order to interpret others.  Please give [place] 48 hours in order for your provider to thoroughly review all the results before contacting the office for clarification of your results.   Until we can better understand what is going on with your swallowing be sure to take swallowing precautions. Eat slowly, chew food well before swallowing. Drink  liquids in between each bite to avoid food impaction.  Thank you for entrusting me with your care and choosing [facility].  Tye Savoy, NP

## 2022-01-02 NOTE — Progress Notes (Signed)
I agree with the assessment and plan as outlined by Ms. Guenther. 

## 2022-01-07 ENCOUNTER — Other Ambulatory Visit: Payer: Self-pay | Admitting: Family Medicine

## 2022-01-08 ENCOUNTER — Encounter: Payer: Self-pay | Admitting: Internal Medicine

## 2022-01-08 ENCOUNTER — Other Ambulatory Visit: Payer: Self-pay | Admitting: Internal Medicine

## 2022-01-08 ENCOUNTER — Ambulatory Visit (AMBULATORY_SURGERY_CENTER): Payer: Commercial Managed Care - PPO | Admitting: Internal Medicine

## 2022-01-08 ENCOUNTER — Other Ambulatory Visit: Payer: Self-pay

## 2022-01-08 VITALS — BP 145/71 | HR 76 | Temp 97.8°F | Resp 19 | Ht 65.0 in | Wt 179.0 lb

## 2022-01-08 DIAGNOSIS — K2100 Gastro-esophageal reflux disease with esophagitis, without bleeding: Secondary | ICD-10-CM | POA: Diagnosis not present

## 2022-01-08 DIAGNOSIS — K222 Esophageal obstruction: Secondary | ICD-10-CM

## 2022-01-08 DIAGNOSIS — K449 Diaphragmatic hernia without obstruction or gangrene: Secondary | ICD-10-CM

## 2022-01-08 DIAGNOSIS — R1310 Dysphagia, unspecified: Secondary | ICD-10-CM

## 2022-01-08 DIAGNOSIS — Z8719 Personal history of other diseases of the digestive system: Secondary | ICD-10-CM

## 2022-01-08 DIAGNOSIS — K2950 Unspecified chronic gastritis without bleeding: Secondary | ICD-10-CM | POA: Diagnosis not present

## 2022-01-08 DIAGNOSIS — K2210 Ulcer of esophagus without bleeding: Secondary | ICD-10-CM | POA: Diagnosis not present

## 2022-01-08 HISTORY — DX: Personal history of other diseases of the digestive system: Z87.19

## 2022-01-08 HISTORY — PX: ESOPHAGOGASTRODUODENOSCOPY: SHX1529

## 2022-01-08 MED ORDER — OMEPRAZOLE 40 MG PO CPDR: 40.0000 mg | DELAYED_RELEASE_CAPSULE | Freq: Two times a day (BID) | ORAL | 1 refills | Status: DC

## 2022-01-08 MED ORDER — SODIUM CHLORIDE 0.9 % IV SOLN: 500.0000 mL | Freq: Once | INTRAVENOUS | Status: DC

## 2022-01-08 NOTE — Progress Notes (Signed)
Called to room to assist during endoscopic procedure.  Patient ID and intended procedure confirmed with present staff. Received instructions for my participation in the procedure from the performing physician.  

## 2022-01-08 NOTE — Progress Notes (Signed)
GASTROENTEROLOGY PROCEDURE H&P NOTE   Primary Care Physician: Tawnya Crook, MD    Reason for Procedure:   Dysphagia  Plan:    Colonoscopy  Patient is appropriate for endoscopic procedure(s) in the ambulatory ([place]) setting.  The nature of the procedure, as well as the risks, benefits, and alternatives were carefully and thoroughly reviewed with the patient. Ample time for discussion and questions allowed. The patient understood, was satisfied, and agreed to proceed.     HPI: Valerie [name] is a 51 y.o. female who presents for colonoscopy for evaluation of dysphagia .  Patient was most recently seen in the Gastroenterology Clinic on 01/02/22.  No interval change in medical history since that appointment. Please refer to that note for full details regarding GI history and clinical presentation.   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 Authorizing Provider  ascorbic acid (VITAMIN C) 1000 MG tablet Take 1 tablet by mouth daily.   Yes [provider]  cholecalciferol (VITAMIN D3) 25 MCG (1000 UNIT)  tablet Take 5,000 Units by mouth daily.   Yes [provider]  furosemide (LASIX) 20 MG tablet TAKE 1 TABLET BY MOUTH EVERY DAY 01/07/22  Yes Tawnya Crook, MD  levothyroxine (SYNTHROID) 75 MCG tablet Take 1 tablet (75 mcg total) by mouth daily before breakfast. 12/06/21  Yes Philemon Kingdom, MD  MAGNESIUM GLUCONATE PO Take 2 tablets by mouth daily at 12 noon.   Yes [provider]  omeprazole (PRILOSEC) 20 MG capsule Take 1 capsule (20 mg total) by mouth daily. Patient taking differently: Take 20 mg by mouth every other day. 11/15/21  Yes Tawnya Crook, MD  Probiotic Product (CULTRELLE KIDS IMMUNE DEFENSE PO) Take 1 tablet by mouth daily.   Yes [provider]  tamoxifen (NOLVADEX) 10 MG tablet Take 1 tablet by mouth daily.   Yes [provider]  Tocilizumab (ACTEMRA) 162 MG/0.9ML SOSY For MOGAD 06/29/21  Yes [provider]  UNABLE TO FIND Take 4 tablets by mouth daily. Med Name: Omega Pure 2 tablet in AM and 2 tablet in PM   Yes [provider]  UNABLE TO FIND Take by mouth daily. Med Name: Ultra Inflamix 2 scoops daily   Yes [provider]  UNABLE TO FIND Take 2 tablets by mouth daily. Med Name: R-Lipoic Acid   Yes [provider]    Current Outpatient Medications  Medication Sig Dispense Refill   ascorbic acid (VITAMIN C) 1000 MG tablet Take 1 tablet by mouth daily.     cholecalciferol (VITAMIN  D3) 25 MCG (1000 UNIT) tablet Take 5,000 Units by mouth daily.     furosemide (LASIX) 20 MG tablet TAKE 1 TABLET BY MOUTH EVERY DAY 30 tablet 1   levothyroxine (SYNTHROID) 75 MCG tablet Take 1 tablet (75 mcg total) by mouth daily before breakfast. 90 tablet 3   MAGNESIUM GLUCONATE PO Take 2 tablets by mouth daily at 12 noon.     omeprazole (PRILOSEC) 20 MG capsule Take 1 capsule (20 mg total) by mouth daily. (Patient taking differently: Take 20 mg by mouth every other day.) 90 capsule 1   Probiotic Product (CULTRELLE KIDS  IMMUNE DEFENSE PO) Take 1 tablet by mouth daily.     tamoxifen (NOLVADEX) 10 MG tablet Take 1 tablet by mouth daily.     Tocilizumab (ACTEMRA) 162 MG/0.9ML SOSY For MOGAD     UNABLE TO FIND Take 4 tablets by mouth daily. Med Name: Omega Pure 2 tablet in AM and 2 tablet in PM     UNABLE TO FIND Take by mouth daily. Med Name: Ultra Inflamix 2 scoops daily     UNABLE TO FIND Take 2 tablets by mouth daily. Med Name: R-Lipoic Acid     Current Facility-Administered Medications  Medication Dose Route Frequency Provider Last Rate Last Admin   0.9 %  sodium chloride infusion  500 mL Intravenous Once Sharyn Creamer, MD        Allergies as of 01/08/2022   (No Known Allergies)    Family History  Problem Relation Age of Onset   Hearing loss Mother    Thyroid disease Mother    Cancer Mother 57       endometrial cancer, lynch negative   GER disease Mother    Other Mother        uterine fibroids, optic disc abnormality    Eczema Mother    Colon polyps Mother    Hypothyroidism Mother    Osteoarthritis Mother    Hypertension Mother    Migraines Mother    Colon polyps Father    Hearing loss Father    Other Father        Colon adenoma   Hyperlipidemia Father    GER disease Father    Heart murmur Father    Amblyopia Father        Right eye   Neuropathy Father    Glaucoma Maternal Grandmother    Macular degeneration Maternal Grandmother    Multiple sclerosis Paternal Grandmother    Breast cancer Neg Hx    Colon cancer Neg Hx    Esophageal cancer Neg Hx    Rectal cancer Neg Hx    Stomach cancer Neg Hx     Social History   Socioeconomic History   Marital status: Married    Spouse name: Not on file   Number of children: 2   Years of education: BS   Highest education level: Not on file  Occupational History   Occupation: Agricultural consultant   Occupation: Shipping and receiving clerk  Tobacco Use   Smoking status: Never   Smokeless tobacco: Never  Vaping Use   Vaping Use: Never  used  Substance and Sexual Activity   Alcohol use: Never   Drug use: Never   Sexual activity: Yes    Birth control/protection: Surgical    Comment: BTL  Other Topics Concern   Not on file  Social History Narrative   Right handed    Caffeine use: rare   Social Determinants of Health  Financial Resource Strain: Low Risk  (03/19/2021)   Overall Financial Resource Strain (CARDIA)    Difficulty of Paying Living Expenses: Not hard at all  Food Insecurity: No Food Insecurity (03/19/2021)   Hunger Vital Sign    Worried About Running Out of Food in the Last Year: Never true    Ran Out of Food in the Last Year: Never true  Transportation Needs: No Transportation Needs (03/19/2021)   PRAPARE - Hydrologist (Medical): No    Lack of Transportation (Non-Medical): No  Physical Activity: Inactive (03/19/2021)   Exercise Vital Sign    Days of Exercise per Week: 0 days    Minutes of Exercise per Session: 0 min  Stress: No Stress Concern Present (03/19/2021)   [place]    Feeling of Stress : Not at all  Social Connections: [place] (03/19/2021)   Social Connection and Isolation Panel [NHANES]    Frequency of Communication with Friends and Family: More than three times a week    Frequency of Social Gatherings with Friends and Family: Three times a week    Attends Religious Services: More than 4 times per year    Active Member of Clubs or Organizations: Yes    Attends Archivist Meetings: 1 to 4 times per year    Marital Status: Married  Human resources officer Violence: Not At Risk (03/19/2021)   Humiliation, Afraid, Rape, and Kick questionnaire    Fear of Current or Ex-Partner: No    Emotionally Abused: No    Physically Abused: No    Sexually Abused: No    Physical Exam: Vital signs in last 24 hours: BP 110/61   Pulse 65   Temp 97.8 F (36.6 C)   Ht '5\' 5"'$  (1.651 m)   Wt 179 lb  (81.2 kg)   LMP 12/24/2021   SpO2 97%   BMI 29.79 kg/m  GEN: NAD EYE: Sclerae anicteric ENT: MMM CV: Non-tachycardic Pulm: No increased WOB GI: Soft NEURO:  Alert & Oriented   Christia Reading, MD [facility]   01/08/2022 8:48 AM

## 2022-01-08 NOTE — Patient Instructions (Signed)
Await pathology results.  Please adhere to the Post Esophageal Dilation Diet:  Nothing by mouth until 11:00am,  Clear liquids for 11:00 am to 12:00 noon. Soft diet after noon and until the rest of the day.  You may proceed to your prior diet tomorrow morning.  A Prescription for Prilosec '40mg'$  twice daily for 8 weeks has been sent to your pharmacy.  Return to GI clinic in 6 weeks (Someone will call you to schedule this appointment).  Handouts on post esophageal dilation diet, esophagitis and stricture, GERD/hernia provided.  YOU HAD AN ENDOSCOPIC PROCEDURE TODAY AT [facility]:   Refer to the procedure report that was given to you for any specific questions about what was found during the examination.  If the procedure report does not answer your questions, please call your gastroenterologist to clarify.  If you requested that your care partner not be given the details of your procedure findings, then the procedure report has been included in a sealed envelope for you to review at your convenience later.  YOU SHOULD EXPECT: Some feelings of bloating in the abdomen. Passage of more gas than usual.  Walking can help get rid of the air that was put into your GI tract during the procedure and reduce the bloating. If you had a lower endoscopy (such as a colonoscopy or flexible sigmoidoscopy) you may notice spotting of blood in your stool or on the toilet paper. If you underwent a bowel prep for your procedure, you may not have a normal bowel movement for a few days.  Please Note:  You might notice some irritation and congestion in your nose or some drainage.  This is from the oxygen used during your procedure.  There is no need for concern and it should clear up in a day or so.  SYMPTOMS TO REPORT IMMEDIATELY: Following upper endoscopy (EGD)  Vomiting of blood or coffee ground material  New chest pain or pain under the shoulder blades  Painful or persistently difficult  swallowing  New shortness of breath  Fever of 100F or higher  Black, tarry-looking stools  For urgent or emergent issues, a gastroenterologist can be reached at any hour by calling [phone]. Do not use MyChart messaging for urgent concerns.    DIET:  Post dilation diet (Handout provided).  You should avoid alcoholic beverages for 24 hours.  ACTIVITY:  You should plan to take it easy for the rest of today and you should NOT DRIVE or use heavy machinery until tomorrow (because of the sedation medicines used during the test).    FOLLOW UP: Our staff will call the number listed on your records the next business day following your procedure.  We will call around 7:15- 8:00 am to check on you and address any questions or concerns that you may have regarding the information given to you following your procedure. If we do not reach you, we will leave a message.     If any biopsies were taken you will be contacted by phone or by letter within the next 1-3 weeks.  Please call us at [phone] if you have not heard about the biopsies in 3 weeks.    SIGNATURES/CONFIDENTIALITY: You and/or your care partner have signed paperwork which will be entered into your electronic medical record.  These signatures attest to the fact that that the information above on your After Visit Summary has been reviewed and is understood.  Full responsibility of the confidentiality of this discharge information  lies with you and/or your care-partner.  

## 2022-01-08 NOTE — Progress Notes (Signed)
Pt resting comfortably. VSS. Airway intact. SBAR complete to RN. All questions answered.   

## 2022-01-08 NOTE — Op Note (Signed)
[facility] Patient Name: Arine Foley Procedure Date: 01/08/2022 9:40 AM MRN: [id number] Endoscopist: Sonny Masters "Christia Reading ,  Age: 51 Referring MD:  Date of Birth: [birth year] Gender: Female Account #: [id number] Procedure:                Upper GI endoscopy Indications:              Dysphagia, Heartburn Medicines:                Monitored Anesthesia Care Procedure:                Pre-Anesthesia Assessment:                           - Prior to the procedure, a History and Physical                            was performed, and patient medications and                            allergies were reviewed. The patient's tolerance of                            previous anesthesia was also reviewed. The risks                            and benefits of the procedure and the sedation                            options and risks were discussed with the patient.                            All questions were answered, and informed consent                            was obtained. Prior Anticoagulants: The patient has                            taken no previous anticoagulant or antiplatelet                            agents. ASA Grade Assessment: II - A patient with                            mild systemic disease. After reviewing the risks                            and benefits, the patient was deemed in                            satisfactory condition to undergo the procedure.                           After obtaining informed consent, the endoscope was  passed under direct vision. Throughout the                            procedure, the patient's blood pressure, pulse, and                            oxygen saturations were monitored continuously. The                            GIF [id number] #[id number] was introduced through the                            mouth, and advanced to the second part of duodenum.                            The upper GI endoscopy was  accomplished without                            difficulty. The patient tolerated the procedure                            well. Scope In: Scope Out: Findings:                 One benign-appearing, intrinsic moderate                            (circumferential scarring or stenosis; an endoscope                            may pass) stenosis was found at the                            gastroesophageal junction. This stenosis measured                            less than one cm (in length). The stenosis was                            traversed. A TTS dilator was passed through the                            scope. Dilation with an 18-19-20 mm balloon dilator                            was performed to 20 mm. The dilation site was                            examined following endoscope reinsertion and showed                            mild mucosal disruption. Biopsies were taken with a  cold forceps for histology.                           A 2 cm hiatal hernia was present.                           Localized mild inflammation characterized by                            congestion (edema), erosions and erythema was found                            in the gastric antrum. Biopsies were taken with a                            cold forceps for histology.                           The examined duodenum was normal. Complications:            No immediate complications. Estimated Blood Loss:     Estimated blood loss was minimal. Impression:               - Benign-appearing esophageal stenosis. Dilated.                            Biopsied.                           - 2 cm hiatal hernia.                           - Gastritis. Biopsied.                           - Normal examined duodenum. Recommendation:           - Discharge patient to home (with escort).                           - Use Prilosec (omeprazole) 40 mg PO BID for 8                            weeks.                            - Await pathology results.                           - Return to GI clinic in 6 weeks.                           - The findings and recommendations were discussed                            with the patient. Dr Georgian Co "[place]" [place],  01/08/2022 10:06:12 AM

## 2022-01-09 ENCOUNTER — Telehealth: Payer: Self-pay

## 2022-01-09 ENCOUNTER — Other Ambulatory Visit (HOSPITAL_COMMUNITY): Payer: Self-pay

## 2022-01-09 NOTE — Telephone Encounter (Signed)
Was notified about PA requirement through Pt/Provider.   PA has been completed through CoverMyMeds by [employer].  Received notification from [facility] regarding a prior authorization for Omeprazole '40mg'$  DR caps. Authorization has been APPROVED from 01-09-2022 to 01-10-2023.   Per test claim, copay for 30 days supply is $10.00  Key: [id number] PA Case ID: [id number]

## 2022-01-09 NOTE — Telephone Encounter (Signed)
  Follow up Call-     01/08/2022    8:25 AM 09/06/2021    7:34 AM  Call back number  Post procedure Call Back phone  # [phone]  Permission to leave phone message Yes Yes     Patient questions:  Do you have a fever, pain , or abdominal swelling? No. Pain Score  0 *  Have you tolerated food without any problems? Yes.    Have you been able to return to your normal activities? Yes.    Do you have any questions about your discharge instructions: Diet   No. Medications  No. Follow up visit  No.  Do you have questions or concerns about your Care? No.  Actions: * If pain score is 4 or above: No action needed, pain <4.

## 2022-01-10 ENCOUNTER — Encounter: Payer: Self-pay | Admitting: Internal Medicine

## 2022-01-13 ENCOUNTER — Other Ambulatory Visit (HOSPITAL_COMMUNITY): Payer: Self-pay

## 2022-01-15 ENCOUNTER — Other Ambulatory Visit: Payer: Commercial Managed Care - PPO

## 2022-01-17 NOTE — Telephone Encounter (Signed)
Please see previous encounter. Nothing else needed. Please sign/close this encounter.

## 2022-01-18 ENCOUNTER — Other Ambulatory Visit: Payer: Self-pay | Admitting: Family Medicine

## 2022-01-18 ENCOUNTER — Encounter: Payer: Self-pay | Admitting: Rehabilitation

## 2022-01-22 ENCOUNTER — Other Ambulatory Visit (INDEPENDENT_AMBULATORY_CARE_PROVIDER_SITE_OTHER): Payer: Commercial Managed Care - PPO

## 2022-01-22 DIAGNOSIS — E063 Autoimmune thyroiditis: Secondary | ICD-10-CM | POA: Diagnosis not present

## 2022-01-22 DIAGNOSIS — E038 Other specified hypothyroidism: Secondary | ICD-10-CM

## 2022-01-22 LAB — TSH: TSH: 8.75 u[IU]/mL — ABNORMAL HIGH (ref 0.35–5.50)

## 2022-01-22 LAB — T4, FREE: Free T4: 0.77 ng/dL (ref 0.60–1.60)

## 2022-02-01 ENCOUNTER — Other Ambulatory Visit: Payer: Self-pay | Admitting: Internal Medicine

## 2022-02-01 ENCOUNTER — Other Ambulatory Visit: Payer: Self-pay | Admitting: Hematology and Oncology

## 2022-02-01 DIAGNOSIS — E038 Other specified hypothyroidism: Secondary | ICD-10-CM

## 2022-02-03 ENCOUNTER — Other Ambulatory Visit: Payer: Self-pay

## 2022-02-03 MED ORDER — TAMOXIFEN CITRATE 10 MG PO TABS: 10.0000 mg | ORAL_TABLET | Freq: Every day | ORAL | 3 refills | Status: DC

## 2022-02-04 ENCOUNTER — Telehealth: Payer: Self-pay | Admitting: *Deleted

## 2022-02-04 NOTE — Telephone Encounter (Signed)
Patient returned call and stated that she will need medication sent to [facility] when it is time, but right now she doesn't need them because she only take it every other day.

## 2022-02-04 NOTE — Telephone Encounter (Signed)
[employer] sent fax requesting refill of Lasix 20 mg tablet. Called patient to confirm pharmacy change. Left message to return my call.

## 2022-02-07 ENCOUNTER — Other Ambulatory Visit: Payer: Self-pay

## 2022-02-10 ENCOUNTER — Other Ambulatory Visit: Payer: Self-pay

## 2022-02-19 ENCOUNTER — Other Ambulatory Visit: Payer: Self-pay | Admitting: Internal Medicine

## 2022-02-19 DIAGNOSIS — K449 Diaphragmatic hernia without obstruction or gangrene: Secondary | ICD-10-CM

## 2022-02-19 DIAGNOSIS — R1310 Dysphagia, unspecified: Secondary | ICD-10-CM

## 2022-02-20 ENCOUNTER — Encounter: Payer: Self-pay | Admitting: Internal Medicine

## 2022-02-20 ENCOUNTER — Encounter: Payer: Self-pay | Admitting: Family Medicine

## 2022-02-20 ENCOUNTER — Ambulatory Visit (INDEPENDENT_AMBULATORY_CARE_PROVIDER_SITE_OTHER): Payer: Commercial Managed Care - PPO | Admitting: Internal Medicine

## 2022-02-20 VITALS — BP 100/70 | HR 77 | Ht 65.5 in | Wt 180.0 lb

## 2022-02-20 DIAGNOSIS — K219 Gastro-esophageal reflux disease without esophagitis: Secondary | ICD-10-CM

## 2022-02-20 DIAGNOSIS — R1310 Dysphagia, unspecified: Secondary | ICD-10-CM | POA: Diagnosis not present

## 2022-02-20 DIAGNOSIS — K449 Diaphragmatic hernia without obstruction or gangrene: Secondary | ICD-10-CM | POA: Diagnosis not present

## 2022-02-20 MED ORDER — OMEPRAZOLE 20 MG PO CPDR: 20.0000 mg | DELAYED_RELEASE_CAPSULE | Freq: Every day | ORAL | 11 refills | Status: DC

## 2022-02-20 NOTE — Progress Notes (Signed)
Chief Complaint:  GERD, hiatal hernia   Assessment &  Plan   GERD Hiatal hernia History of esophageal stricture Patient's GERD has improved on the omeprazole therapy. She is able to tolerate 40 mg QD so will trial her on omeprazole 20 mg QD. Dysphagia has improved after dilation of esophageal stricture. She does have some persistent throat clearing, but this may be related to non-GERD etiology such as seasonal allergies or post-nasal drip. Will reduce omeprazole from 40 mg to 20 mg QD RTC in 1 year   HPI   Valerie [name] is a 51 y.o. female with history of hypothyroidism, GERD, breast cancer, sessile serrated colon polyp, myelin oligodendrocyte glycoprotein antibody disorder presents with follow up of GERD and hiatal hernia  Interval History:  The omeprazole has helped with the acid reflux. She decreased from BID to QD for the last 4-5 weeks. She still has to clear her throat. She did have improvement in her dysphagia after her dilation. She is eating well. Denies ab pain. Denies constipation or diarrhea.  Labs 01/2022: TSH with elevated 8.75. FT4 nml.   Colonoscopy 09/06/21: - The examined portion of the ileum was normal. - Granular mucosa at the ileocecal valve. Biopsied. - One 6 mm polyp in the cecum, removed with a cold snare. Resected and retrieved. - Non-bleeding internal hemorrhoids. Path: 1. Surgical [P], colon, cecum, polyp (1) SESSILE SERRATED POLYP WITHOUT CYTOLOGIC DYSPLASIA 2. Surgical [P], small bowel, ileocecal valve BENIGN ILEOCOLONIC JUNCTIONAL TYPE MUCOSA WITHOUT DIAGNOSTIC ABNORMALITY  EGD 01/08/22: - Benign-appearing esophageal stenosis. Dilated. Biopsied. - 2 cm hiatal hernia. - Gastritis. Biopsied. - Normal examined duodenum. Path: 1. Surgical [P], gastric REACTIVE GASTROPATHY WITH MINIMAL CHRONIC GASTRITIS NEGATIVE FOR H. PYLORI, INTESTINAL METAPLASIA, DYSPLASIA AND CARCINOMA 2. Surgical [P], esophageal stricture REACTIVE SQUAMOUS MUCOSA  WITH FOCAL EROSION MILD CHRONIC GASTRITIS NEGATIVE FOR INTESTINAL METAPLASIA, DYSPLASIA AND CARCINOMA (SEE MICROSCOPIC COMMENT) 3. Surgical [P], esophageal REFLUX ESOPHAGITIS (5 EOS/HIGH POWER FIELD) NEGATIVE FOR GLANDULAR EPITHELIUM, DYSPLASIA AND CARCINOMA Microscopic Comment 2. Sections of the esophageal stricture show multiple fragments of squamous mucosa and a fragment of squama gastric junctional mucosa. The squamous epithelium shows reactive changes including elongation papillae and basal zone hyperplasia with a patchy mononuclear cell infiltrate. At the squamogastric junction the squamous epithelium is eroded. The scant cardia type gastric mucosa shows a chronic inflammatory cell infiltrate including plasma cells. There is no evidence of intestinal metaplasia, dysplasia or carcinoma.  Labs:     Latest Ref Rng & Units 11/08/2021   10:16 AM 10/21/2021    8:08 AM 06/11/2021    8:06 AM  CBC  WBC 4.0 - 10.5 K/uL 5.8  3.7  7.1   Hemoglobin 12.0 - 15.0 g/dL 14.2  15.3  14.4   Hematocrit 36.0 - 46.0 % 41.6  44.6  44.5   Platelets 150 - 400 K/uL 176  164  221        Latest Ref Rng & Units 11/08/2021   10:16 AM 10/21/2021    8:08 AM 06/11/2021    8:06 AM  Hepatic Function  Total Protein 6.5 - 8.1 g/dL 5.9  6.1  6.2   Albumin 3.5 - 5.0 g/dL 3.7  3.9  4.0   AST 15 - 41 U/L _0 ALT 0 - 44 U/L _1 Alk Phosphatase 38 - 126 U/L 26  30  36   Total Bilirubin 0.3 - 1.2 mg/dL 0.4  0.3  <  0.2   Bilirubin, Direct 0.00 - 0.40 mg/dL   0.11       Current Outpatient Medications  Medication Sig Dispense Refill   ascorbic acid (VITAMIN C) 1000 MG tablet Take 1 tablet by mouth daily.     cholecalciferol (VITAMIN D3) 25 MCG (1000 UNIT) tablet Take 5,000 Units by mouth daily.     furosemide (LASIX) 20 MG tablet TAKE 1 TABLET BY MOUTH EVERY DAY 90 tablet 1   levothyroxine (SYNTHROID) 75 MCG tablet Take 1 tablet (75 mcg total) by mouth daily before breakfast. 90 tablet 3   MAGNESIUM  GLUCONATE PO Take 2 tablets by mouth daily at 12 noon.     omeprazole (PRILOSEC) 40 MG capsule TAKE 1 CAPSULE BY MOUTH TWICE  DAILY FOR 8 WEEKS (Patient taking differently: Take 40 mg by mouth daily.) 120 capsule 5   Probiotic Product (CULTRELLE KIDS IMMUNE DEFENSE PO) Take 1 tablet by mouth daily.     tamoxifen (NOLVADEX) 10 MG tablet Take 1 tablet (10 mg total) by mouth daily. 90 tablet 3   Tocilizumab (ACTEMRA) 162 MG/0.9ML SOSY For MOGAD     UNABLE TO FIND Take 4 tablets by mouth daily. Med Name: Omega Pure 2 tablet in AM and 2 tablet in PM     UNABLE TO FIND Take by mouth daily. Med Name: Ultra Inflamix 2 scoops daily     UNABLE TO FIND Take 2 tablets by mouth daily. Med Name: R-Lipoic Acid     No current facility-administered medications for this visit.    Physical Exam  Wt Readings from Last 3 Encounters:  02/20/22 180 lb (81.6 kg)  01/08/22 179 lb (81.2 kg)  01/02/22 179 lb 6 oz (81.4 kg)    BP 100/70   Pulse 77   Ht 5' 5.5" (1.664 m)   Wt 180 lb (81.6 kg)   BMI 29.50 kg/m  Constitutional:  Generally well appearing female in no acute distress. Psychiatric: Pleasant. Normal mood and affect. Behavior is normal. EENT: Pupils normal.  Conjunctivae are normal. No scleral icterus. No candida seen Neck supple.  Cardiovascular: Normal rate, regular rhythm. No edema Pulmonary/chest: Effort normal and breath sounds normal. No wheezing, rales or rhonchi. Abdominal: Soft, nondistended, nontender. Bowel sounds active throughout. There are no masses palpable. No hepatomegaly. Neurological: Alert and oriented to person place and time. Skin: Skin is warm and dry. No rashes noted.  Sharyn Creamer, NP  02/20/2022, 8:38 AM

## 2022-02-20 NOTE — Patient Instructions (Signed)
If you are age 52 or younger, your body mass index should be between 19-25. Your Body mass index is 29.5 kg/m. If this is out of the aformentioned range listed, please consider follow up with your Primary Care Provider.  ________________________________________________________  The [facility] providers would like to encourage you to use [facility] to communicate with providers for non-urgent requests or questions.  Due to long hold times on the telephone, sending your provider a message by [facility] may be a faster and more efficient way to get a response.  Please allow 48 business hours for a response.  Please remember that this is for non-urgent requests.  _______________________________________________________  We have sent the following medications to your pharmacy for you to pick up at your convenience:  DECREASE: Omeprazole to '20mg'$  daily.  You will need a follow up appointment in 1 year.  We will contact you to schedule this appointment.  Thank you for entrusting me with your care and choosing [facility].  Dr Lorenso Courier

## 2022-02-21 ENCOUNTER — Other Ambulatory Visit: Payer: Self-pay

## 2022-02-21 DIAGNOSIS — R1310 Dysphagia, unspecified: Secondary | ICD-10-CM

## 2022-02-21 DIAGNOSIS — K449 Diaphragmatic hernia without obstruction or gangrene: Secondary | ICD-10-CM

## 2022-02-21 MED ORDER — OMEPRAZOLE 20 MG PO CPDR: 20.0000 mg | DELAYED_RELEASE_CAPSULE | Freq: Every day | ORAL | 3 refills | Status: DC

## 2022-02-25 ENCOUNTER — Ambulatory Visit (INDEPENDENT_AMBULATORY_CARE_PROVIDER_SITE_OTHER): Payer: Commercial Managed Care - PPO | Admitting: Family Medicine

## 2022-02-25 ENCOUNTER — Encounter (HOSPITAL_BASED_OUTPATIENT_CLINIC_OR_DEPARTMENT_OTHER): Payer: Self-pay | Admitting: Obstetrics & Gynecology

## 2022-02-25 ENCOUNTER — Encounter: Payer: Self-pay | Admitting: Family Medicine

## 2022-02-25 VITALS — BP 100/60 | HR 76 | Temp 98.1°F | Ht 65.25 in | Wt 181.2 lb

## 2022-02-25 DIAGNOSIS — R600 Localized edema: Secondary | ICD-10-CM | POA: Insufficient documentation

## 2022-02-25 LAB — COMPREHENSIVE METABOLIC PANEL
ALT: 27 U/L (ref 0–35)
AST: 20 U/L (ref 0–37)
Albumin: 3.8 g/dL (ref 3.5–5.2)
Alkaline Phosphatase: 30 U/L — ABNORMAL LOW (ref 39–117)
BUN: 19 mg/dL (ref 6–23)
CO2: 29 mEq/L (ref 19–32)
Calcium: 8.7 mg/dL (ref 8.4–10.5)
Chloride: 105 mEq/L (ref 96–112)
Creatinine, Ser: 0.86 mg/dL (ref 0.40–1.20)
GFR: 77.69 mL/min (ref >60.00)
Glucose, Bld: 89 mg/dL (ref 70–99)
Potassium: 4.7 mEq/L (ref 3.5–5.1)
Sodium: 139 mEq/L (ref 135–145)
Total Bilirubin: 0.4 mg/dL (ref 0.2–1.2)
Total Protein: 6 g/dL (ref 6.0–8.3)

## 2022-02-25 LAB — CBC WITH DIFFERENTIAL/PLATELET
Basophils Absolute: 0 10*3/uL (ref 0.0–0.1)
Basophils Relative: 0.3 % (ref 0.0–3.0)
Eosinophils Absolute: 0.4 10*3/uL (ref 0.0–0.7)
Eosinophils Relative: 5.7 % — ABNORMAL HIGH (ref 0.0–5.0)
HCT: 43.2 % (ref 36.0–46.0)
Hemoglobin: 14.5 g/dL (ref 12.0–15.0)
Lymphocytes Relative: 21.7 % (ref 12.0–46.0)
Lymphs Abs: 1.4 10*3/uL (ref 0.7–4.0)
MCHC: 33.6 g/dL (ref 30.0–36.0)
MCV: 97.5 fl (ref 78.0–100.0)
Monocytes Absolute: 0.9 10*3/uL (ref 0.1–1.0)
Monocytes Relative: 13.5 % — ABNORMAL HIGH (ref 3.0–12.0)
Neutro Abs: 3.9 10*3/uL (ref 1.4–7.7)
Neutrophils Relative %: 58.8 % (ref 43.0–77.0)
Platelets: 171 10*3/uL (ref 150.0–400.0)
RBC: 4.44 Mil/uL (ref 3.87–5.11)
RDW: 12.4 % (ref 11.5–15.5)
WBC: 6.6 10*3/uL (ref 4.0–10.5)

## 2022-02-25 LAB — MAGNESIUM: Magnesium: 1.8 mg/dL (ref 1.5–2.5)

## 2022-02-25 NOTE — Patient Instructions (Addendum)
It was very nice to see you today!  Happy Holidays! Referral sent to vascular Wear compression stockings daily and take the lasix daily    PLEASE NOTE:  If you had any lab tests please let us know if you have not heard back within a few days. You may see your results on MyChart before we have a chance to review them but we will give you a call once they are reviewed by [place]. If we ordered any referrals today, please let us know if you have not heard from their office within the next week.   Please try these tips to maintain a healthy lifestyle:  Eat most of your calories during the day when you are active. Eliminate processed foods including packaged sweets (pies, cakes, cookies), reduce intake of potatoes, white bread, white pasta, and white rice. Look for whole grain options, oat flour or almond flour.  Each meal should contain half fruits/vegetables, one quarter protein, and one quarter carbs (no bigger than a computer mouse).  Cut down on sweet beverages. This includes juice, soda, and sweet tea. Also watch fruit intake, though this is a healthier sweet option, it still contains natural sugar! Limit to 3 servings daily.  Drink at least 1 glass of water with each meal and aim for at least 8 glasses per day  Exercise at least 150 minutes every week.

## 2022-02-25 NOTE — Progress Notes (Signed)
Subjective:     Patient ID: Valerie [name], female    DOB: 05-15-1970, 51 y.o.   MRN: [id number]  Chief Complaint  Patient presents with   Follow-up    Follow-up on fluid retention that will not go away    HPI Edema BLE-many months.  At first thought pred, but off since May.  Heat makes worse.  Did have in past rarely. Chronic edema hand on L w/compression and lymphedema pump(but from surgery breast)-can be worse at times.  Taking lasix qod.  Occ compression stockings.  Pt wants etiology rather than just tx.  Echo has been done, dopplers.   Feels "heavy" in lower back-chiro thought "fluid".  edema Feet and ankles B.  Not R hand, chronic LUE.   Back can be so bad, hard to walk.  Seeing chiro.  There are no preventive care reminders to display for this patient.  Past Medical History:  Diagnosis Date   Cancer ([place]) 09/2020   left breast   GERD (gastroesophageal reflux disease)    due to prednisone   Hashimoto's disease    History of radiation therapy 2022   completed 01-18-2021   Mitral regurgitation    mild by echo 06/2020   Myelin oligodendrocyte glycoprotein antibody disorder (MOGAD)    Optic neuritis    PONV (postoperative nausea and vomiting)     Past Surgical History:  Procedure Laterality Date   BIOPSY BREAST  10/17/2020   BREAST LUMPECTOMY WITH RADIOACTIVE SEED AND SENTINEL LYMPH NODE BIOPSY Left 11/15/2020   Procedure: LEFT BREAST LUMPECTOMY WITH RADIOACTIVE SEED AND LEFT SENTINEL LYMPH NODE BIOPSY;  Surgeon: Erroll Luna, MD;  Location: [facility];  Service: General;  Laterality: Left;   TUBAL LIGATION  02/2012   WISDOM TOOTH EXTRACTION      Outpatient Medications Prior to Visit  Medication Sig Dispense Refill   ascorbic acid (VITAMIN C) 1000 MG tablet Take 1 tablet by mouth daily.     cholecalciferol (VITAMIN D3) 25 MCG (1000 UNIT) tablet Take 5,000 Units by mouth daily.     furosemide (LASIX) 20 MG tablet TAKE 1 TABLET BY MOUTH EVERY  DAY 90 tablet 1   levothyroxine (SYNTHROID) 75 MCG tablet Take 1 tablet (75 mcg total) by mouth daily before breakfast. 90 tablet 3   MAGNESIUM GLUCONATE PO Take 2 tablets by mouth daily at 12 noon.     omeprazole (PRILOSEC) 20 MG capsule Take 1 capsule (20 mg total) by mouth daily. TAKE 1 CAPSULE BY MOUTH DAILY 90 capsule 3   Probiotic Product (CULTRELLE KIDS IMMUNE DEFENSE PO) Take 1 tablet by mouth daily.     tamoxifen (NOLVADEX) 10 MG tablet Take 1 tablet (10 mg total) by mouth daily. 90 tablet 3   Tocilizumab (ACTEMRA) 162 MG/0.9ML SOSY For MOGAD     UNABLE TO FIND Take 4 tablets by mouth daily. Med Name: Omega Pure 2 tablet in AM and 2 tablet in PM     UNABLE TO FIND Take by mouth daily. Med Name: Ultra Inflamix 2 scoops daily     UNABLE TO FIND Take 2 tablets by mouth daily. Med Name: R-Lipoic Acid     No facility-administered medications prior to visit.    No Known Allergies ROS neg/noncontributory except as noted HPI/below      Objective:     BP 100/60   Pulse 76   Temp 98.1 F (36.7 C) (Temporal)   Ht 5' 5.25" (1.657 m)   Wt 181 lb 4  oz (82.2 kg)   LMP 12/24/2021 (Exact Date) Comment: has one every 2 months, irregular  SpO2 95%   BMI 29.93 kg/m  Wt Readings from Last 3 Encounters:  02/25/22 181 lb 4 oz (82.2 kg)  02/20/22 180 lb (81.6 kg)  01/08/22 179 lb (81.2 kg)    Physical Exam   Gen: WDWN NAD HEENT: NCAT, conjunctiva not injected, sclera nonicteric NECK:  supple, no thyromegaly, no nodes, no carotid bruits CARDIAC: RRR, S1S2+, no murmur. DP 2+B LUNGS: CTAB. No wheezes ABDOMEN:  BS+, soft, NTND, No HSM, no masses EXT:  chronic LUE edema. Tr edema BLE MSK: no gross abnormalities.  NEURO: A&O x3.  CN II-XII intact.  PSYCH: normal mood. Good eye contact     Assessment & Plan:   Problem List Items Addressed This Visit       Other   Local edema - Primary   Relevant Orders   CBC with Differential/Platelet (Completed)   Comprehensive metabolic  panel (Completed)   Magnesium (Completed)   Urinalysis, Routine w reflex microscopic   Ambulatory referral to Vascular Surgery  1.  Edema-chronic.  Has chronic left upper extremity lymphedema from breast surgery.  The legs are newer.  Discussed possible etiologies-including venous insufficiency.  Echocardiogram has been negative.  Thyroid has been high and low at times which may be contributing.  Protein has been a little bit low.  Explained that definitive etiology may not be obtainable.  She is getting a repeat TSH with endocrinology next week.  Will do CBC, CMP, magnesium, urinalysis looking for abnormalities.  Use Lasix 20 mg daily rather than every other day and see if it improves.  Wear compression stockings.  She already drinks a lot of water-advised to add electrolytes to one of her waters per day.  Refer to vascular as may be venous insufficiency.  No orders of the defined types were placed in this encounter.   Wellington Hampshire, MD

## 2022-02-26 ENCOUNTER — Other Ambulatory Visit: Payer: Self-pay | Admitting: *Deleted

## 2022-02-26 MED ORDER — FUROSEMIDE 20 MG PO TABS: 20.0000 mg | ORAL_TABLET | Freq: Every day | ORAL | 2 refills | Status: DC

## 2022-02-27 ENCOUNTER — Other Ambulatory Visit: Payer: Self-pay

## 2022-02-27 ENCOUNTER — Encounter (HOSPITAL_BASED_OUTPATIENT_CLINIC_OR_DEPARTMENT_OTHER): Payer: Self-pay | Admitting: Obstetrics & Gynecology

## 2022-02-27 ENCOUNTER — Ambulatory Visit (INDEPENDENT_AMBULATORY_CARE_PROVIDER_SITE_OTHER): Payer: Commercial Managed Care - PPO | Admitting: Obstetrics & Gynecology

## 2022-02-27 ENCOUNTER — Other Ambulatory Visit (HOSPITAL_COMMUNITY)
Admission: RE | Admit: 2022-02-27 | Discharge: 2022-02-27 | Disposition: A | Discharge status: Home / Self Care | Payer: Commercial Managed Care - PPO | Source: Ambulatory Visit | Attending: Obstetrics & Gynecology | Admitting: Obstetrics & Gynecology

## 2022-02-27 ENCOUNTER — Ambulatory Visit (INDEPENDENT_AMBULATORY_CARE_PROVIDER_SITE_OTHER): Payer: Commercial Managed Care - PPO | Admitting: Family Medicine

## 2022-02-27 VITALS — BP 126/80 | HR 90 | Ht 65.0 in | Wt 180.4 lb

## 2022-02-27 DIAGNOSIS — Z124 Encounter for screening for malignant neoplasm of cervix: Secondary | ICD-10-CM

## 2022-02-27 DIAGNOSIS — N921 Excessive and frequent menstruation with irregular cycle: Secondary | ICD-10-CM | POA: Diagnosis present

## 2022-02-27 DIAGNOSIS — R350 Frequency of micturition: Secondary | ICD-10-CM | POA: Diagnosis not present

## 2022-02-27 DIAGNOSIS — Z79899 Other long term (current) drug therapy: Secondary | ICD-10-CM | POA: Insufficient documentation

## 2022-02-27 DIAGNOSIS — R600 Localized edema: Secondary | ICD-10-CM

## 2022-02-27 LAB — POCT URINALYSIS DIP (MANUAL ENTRY)
Bilirubin, UA: NEGATIVE
Blood, UA: NEGATIVE
Glucose, UA: NEGATIVE mg/dL
Ketones, POC UA: NEGATIVE mg/dL
Leukocytes, UA: NEGATIVE
Nitrite, UA: NEGATIVE
Spec Grav, UA: 1.02 (ref 1.010–1.025)
Urobilinogen, UA: 0.2 E.U./dL
pH, UA: 5 (ref 5.0–8.0)

## 2022-02-27 NOTE — Progress Notes (Signed)
GYNECOLOGY  VISIT  CC:   evaluation of abnormal bleeding  HPI: 51 y.o. G3P0012 [name] here for complaint of changing menstrual cycles.  They are no longer regular.  She has skipped as long as almost 12 weeks between cycles.  Had a very heavy cycle without clotting in September when did have cycle.  She is on Tamoxifen and when she shared how heavy bleeding was with Dr. Cherlynn Kaiser, she recommended having some additional evaluation.  Feel pt should have endometrial biopsy and updated pap smear done today and possibly an ultrasound as well.  She is ok with all of this and having the biopsy done today.   Past Medical History:  Diagnosis Date   Cancer ([place]) 09/2020   left breast   GERD (gastroesophageal reflux disease)    due to prednisone   Hashimoto's disease    History of radiation therapy 2022   completed 01-18-2021   Mitral regurgitation    mild by echo 06/2020   Myelin oligodendrocyte glycoprotein antibody disorder (MOGAD)    Optic neuritis    PONV (postoperative nausea and vomiting)     MEDS:   Current Outpatient Medications on File Prior to Visit  Medication Sig Dispense Refill   ascorbic acid (VITAMIN C) 1000 MG tablet Take 1 tablet by mouth daily.     cholecalciferol (VITAMIN D3) 25 MCG (1000 UNIT) tablet Take 5,000 Units by mouth daily.     furosemide (LASIX) 20 MG tablet Take 1 tablet (20 mg total) by mouth daily. 90 tablet 2   levothyroxine (SYNTHROID) 75 MCG tablet Take 1 tablet (75 mcg total) by mouth daily before breakfast. 90 tablet 3   MAGNESIUM GLUCONATE PO Take 2 tablets by mouth daily at 12 noon.     omeprazole (PRILOSEC) 20 MG capsule Take 1 capsule (20 mg total) by mouth daily. TAKE 1 CAPSULE BY MOUTH DAILY 90 capsule 3   Probiotic Product (CULTRELLE KIDS IMMUNE DEFENSE PO) Take 1 tablet by mouth daily.     tamoxifen (NOLVADEX) 10 MG tablet Take 1 tablet (10 mg total) by mouth daily. 90 tablet 3   Tocilizumab (ACTEMRA) 162 MG/0.9ML SOSY For  MOGAD     UNABLE TO FIND Take 4 tablets by mouth daily. Med Name: Omega Pure 2 tablet in AM and 2 tablet in PM     UNABLE TO FIND Take by mouth daily. Med Name: Ultra Inflamix 2 scoops daily     UNABLE TO FIND Take 2 tablets by mouth daily. Med Name: R-Lipoic Acid     No current facility-administered medications on file prior to visit.    ALLERGIES: Patient has no known allergies.  SH:  married, non smoker  Review of Systems  Constitutional: Negative.     PHYSICAL EXAMINATION:    BP 126/80 (BP Location: Right Arm, Patient Position: Sitting, Cuff Size: Large)   Pulse 90   Ht '5\' 5"'$  (1.651 m) Comment: reported  Wt 180 lb 6.4 oz (81.8 kg)   LMP 12/24/2021 (Exact Date) Comment: has one every 2 months, irregular  BMI 30.02 kg/m     General appearance: alert, cooperative and appears stated age Lymph:  no inguinal LAD noted  Pelvic: External genitalia:  no lesions              Urethra:  normal appearing urethra with no masses, tenderness or lesions              Bartholins and Skenes: normal  Vagina: normal appearing vagina with normal color and discharge, no lesions              Cervix: no lesions             Endometrial biopsy recommended.  Discussed with patient.  Verbal and written consent obtained.   Procedure:  Speculum placed.  Cervix visualized and cleansed with betadine prep.  A single toothed tenaculum was applied to the anterior lip of the cervix.  Endometrial pipelle was advanced through the cervix into the endometrial cavity without difficulty.  Pipelle passed to 7cm.  Suction applied and pipelle removed with good tissue sample obtained.  Tenculum removed.  No bleeding noted.  Patient tolerated procedure well.  Chaperone, Octaviano Batty, CMA, was present for exam.  Assessment/Plan: 1. Menorrhagia with irregular cycle - biopsy obtained today without difficulty.  Will order ultrasound as well - Surgical pathology( [place]/ [facility]) - Cytology - PAP(  [place]) - US PELVIC COMPLETE WITH TRANSVAGINAL; Future  2. High risk medication use (Tamoxifen use) - Surgical pathology( [place]/ [facility])  3. Cervical cancer screening - Cytology - PAP( [place])

## 2022-02-27 NOTE — Telephone Encounter (Signed)
Urine was not sent out. Valerie [name] called patient to come give another sample to send out.

## 2022-02-28 NOTE — Progress Notes (Signed)
Nurse/lab visit. Patient not seen.

## 2022-02-28 NOTE — Progress Notes (Signed)
No note needed 

## 2022-03-02 ENCOUNTER — Encounter (HOSPITAL_BASED_OUTPATIENT_CLINIC_OR_DEPARTMENT_OTHER): Payer: Self-pay | Admitting: Obstetrics & Gynecology

## 2022-03-03 LAB — SURGICAL PATHOLOGY

## 2022-03-04 ENCOUNTER — Other Ambulatory Visit (INDEPENDENT_AMBULATORY_CARE_PROVIDER_SITE_OTHER): Payer: Commercial Managed Care - PPO

## 2022-03-04 DIAGNOSIS — E038 Other specified hypothyroidism: Secondary | ICD-10-CM | POA: Diagnosis not present

## 2022-03-04 DIAGNOSIS — E063 Autoimmune thyroiditis: Secondary | ICD-10-CM | POA: Diagnosis not present

## 2022-03-04 LAB — CYTOLOGY - PAP
Comment: NEGATIVE
Diagnosis: UNDETERMINED — AB
High risk HPV: NEGATIVE

## 2022-03-04 LAB — TSH: TSH: 0.9 u[IU]/mL (ref 0.35–5.50)

## 2022-03-04 LAB — T4, FREE: Free T4: 1.07 ng/dL (ref 0.60–1.60)

## 2022-03-05 ENCOUNTER — Other Ambulatory Visit: Payer: Commercial Managed Care - PPO

## 2022-03-10 ENCOUNTER — Ambulatory Visit (HOSPITAL_BASED_OUTPATIENT_CLINIC_OR_DEPARTMENT_OTHER)
Admission: RE | Admit: 2022-03-10 | Discharge: 2022-03-10 | Disposition: A | Discharge status: Home / Self Care | Payer: Commercial Managed Care - PPO | Source: Ambulatory Visit | Attending: Obstetrics & Gynecology | Admitting: Obstetrics & Gynecology

## 2022-03-10 DIAGNOSIS — N921 Excessive and frequent menstruation with irregular cycle: Secondary | ICD-10-CM | POA: Insufficient documentation

## 2022-03-13 ENCOUNTER — Other Ambulatory Visit: Payer: Self-pay | Admitting: *Deleted

## 2022-03-13 ENCOUNTER — Other Ambulatory Visit: Payer: Self-pay | Admitting: Surgery

## 2022-03-13 DIAGNOSIS — R609 Edema, unspecified: Secondary | ICD-10-CM

## 2022-03-13 DIAGNOSIS — C50912 Malignant neoplasm of unspecified site of left female breast: Secondary | ICD-10-CM

## 2022-03-13 NOTE — Progress Notes (Signed)
Lab order have been placed for UA, patient will go to lab to complete.

## 2022-03-13 NOTE — Progress Notes (Signed)
Patient has been notified. Patient will stop by and leave a urine sample.

## 2022-03-14 ENCOUNTER — Other Ambulatory Visit: Payer: Self-pay | Admitting: Surgery

## 2022-03-14 ENCOUNTER — Other Ambulatory Visit (HOSPITAL_BASED_OUTPATIENT_CLINIC_OR_DEPARTMENT_OTHER): Payer: Self-pay | Admitting: Obstetrics & Gynecology

## 2022-03-14 ENCOUNTER — Other Ambulatory Visit: Payer: Commercial Managed Care - PPO

## 2022-03-14 DIAGNOSIS — Z79899 Other long term (current) drug therapy: Secondary | ICD-10-CM

## 2022-03-14 DIAGNOSIS — N921 Excessive and frequent menstruation with irregular cycle: Secondary | ICD-10-CM

## 2022-03-14 DIAGNOSIS — R9389 Abnormal findings on diagnostic imaging of other specified body structures: Secondary | ICD-10-CM

## 2022-03-14 DIAGNOSIS — C50912 Malignant neoplasm of unspecified site of left female breast: Secondary | ICD-10-CM

## 2022-03-14 DIAGNOSIS — R600 Localized edema: Secondary | ICD-10-CM

## 2022-03-14 DIAGNOSIS — N83201 Unspecified ovarian cyst, right side: Secondary | ICD-10-CM

## 2022-03-14 NOTE — Addendum Note (Signed)
Addended by: Doran Clay A on: 03/14/2022 04:17 PM   Modules accepted: Orders

## 2022-03-15 LAB — URINALYSIS, ROUTINE W REFLEX MICROSCOPIC
Bilirubin Urine: NEGATIVE
Glucose, UA: NEGATIVE
Hgb urine dipstick: NEGATIVE
Ketones, ur: NEGATIVE
Leukocytes,Ua: NEGATIVE
Nitrite: NEGATIVE
Protein, ur: NEGATIVE
Specific Gravity, Urine: 1.014 (ref 1.001–1.035)
pH: 7.5 (ref 5.0–8.0)

## 2022-03-17 ENCOUNTER — Other Ambulatory Visit: Payer: Self-pay | Admitting: *Deleted

## 2022-03-17 DIAGNOSIS — M7989 Other specified soft tissue disorders: Secondary | ICD-10-CM

## 2022-03-24 ENCOUNTER — Ambulatory Visit (HOSPITAL_COMMUNITY)
Admission: RE | Admit: 2022-03-24 | Discharge: 2022-03-24 | Disposition: A | Discharge status: Home / Self Care | Payer: Commercial Managed Care - PPO | Source: Ambulatory Visit | Attending: Vascular Surgery | Admitting: Vascular Surgery

## 2022-03-24 ENCOUNTER — Ambulatory Visit (INDEPENDENT_AMBULATORY_CARE_PROVIDER_SITE_OTHER): Payer: Commercial Managed Care - PPO | Admitting: Physician Assistant

## 2022-03-24 VITALS — BP 107/73 | HR 73 | Temp 97.7°F | Resp 14 | Ht 65.5 in | Wt 172.0 lb

## 2022-03-24 DIAGNOSIS — M7989 Other specified soft tissue disorders: Secondary | ICD-10-CM | POA: Diagnosis not present

## 2022-03-24 DIAGNOSIS — I872 Venous insufficiency (chronic) (peripheral): Secondary | ICD-10-CM

## 2022-03-24 NOTE — Progress Notes (Signed)
Requested by:  Tawnya Crook, [place],  [place] [id number]  Reason for consultation: lower extremity edema    History of Present Illness   Valerie [name] is a 51 y.o. (Dec 06, 1970) female who presents for evaluation of bilateral lower extremity edema. She states she has been dealing with bilateral lower leg and ankle swelling for about 6 months, and both legs are equal. Her legs will be most swollen at night and worsen with prolonged sitting and standing. She uses a compression pump left arm sleeve and shorts for 20 minutes nightly to help with swelling. She was prescribed these after being diagnosed with left arm lymphedema s/p lymph node removal for breast cancer. These pumps help her swelling greatly.  She denies any noticeable aching, heaviness, or pain. She denies any bleeding, ulcers, or hx of DVT. She has not had any vein procedures before.  She works as a Banker at [employer].  Past Medical History:  Diagnosis Date   Cancer ([place]) 09/2020   left breast   GERD (gastroesophageal reflux disease)    due to prednisone   Hashimoto's disease    History of radiation therapy 2022   completed 01-18-2021   Mitral regurgitation    mild by echo 06/2020   Myelin oligodendrocyte glycoprotein antibody disorder (MOGAD)    Optic neuritis    PONV (postoperative nausea and vomiting)     Past Surgical History:  Procedure Laterality Date   BIOPSY BREAST  10/17/2020   BREAST LUMPECTOMY WITH RADIOACTIVE SEED AND SENTINEL LYMPH NODE BIOPSY Left 11/15/2020   Procedure: LEFT BREAST LUMPECTOMY WITH RADIOACTIVE SEED AND LEFT SENTINEL LYMPH NODE BIOPSY;  Surgeon: Erroll Luna, MD;  Location: [facility];  Service: General;  Laterality: Left;   TUBAL LIGATION  02/2012   WISDOM TOOTH EXTRACTION      Social History   Socioeconomic History   Marital status: Married    Spouse name: Not on file   Number of children: 2   Years of  education: BS   Highest education level: Not on file  Occupational History   Occupation: Agricultural consultant   Occupation: Shipping and receiving clerk  Tobacco Use   Smoking status: Never   Smokeless tobacco: Not on file  Vaping Use   Vaping Use: Never used  Substance and Sexual Activity   Alcohol use: Never   Drug use: Never   Sexual activity: Yes    Birth control/protection: Surgical    Comment: BTL  Other Topics Concern   Not on file  Social History Narrative   Right handed    Caffeine use: rare   Social Determinants of Health   Financial Resource Strain: Low Risk  (03/19/2021)   Overall Financial Resource Strain (CARDIA)    Difficulty of Paying Living Expenses: Not hard at all  Food Insecurity: No Food Insecurity (03/19/2021)   Hunger Vital Sign    Worried About Running Out of Food in the Last Year: Never true    Ran Out of Food in the Last Year: Never true  Transportation Needs: No Transportation Needs (03/19/2021)   PRAPARE - Hydrologist (Medical): No    Lack of Transportation (Non-Medical): No  Physical Activity: Inactive (03/19/2021)   Exercise Vital Sign    Days of Exercise per Week: 0 days    Minutes of Exercise per Session: 0 min  Stress: No Stress Concern Present (03/19/2021)   [place] -  Occupational Stress Questionnaire    Feeling of Stress : Not at all  Social Connections: Socially Integrated (03/19/2021)   Social Connection and Isolation Panel [NHANES]    Frequency of Communication with Friends and Family: More than three times a week    Frequency of Social Gatherings with Friends and Family: Three times a week    Attends Religious Services: More than 4 times per year    Active Member of Clubs or Organizations: Yes    Attends Archivist Meetings: 1 to 4 times per year    Marital Status: Married  Human resources officer Violence: Not At Risk (03/19/2021)   Humiliation, Afraid, Rape, and Kick  questionnaire    Fear of Current or Ex-Partner: No    Emotionally Abused: No    Physically Abused: No    Sexually Abused: No    Family History  Problem Relation Age of Onset   Hearing loss Mother    Thyroid disease Mother    Cancer Mother 72       endometrial cancer, lynch negative   GER disease Mother    Other Mother        uterine fibroids, optic disc abnormality    Eczema Mother    Colon polyps Mother    Hypothyroidism Mother    Osteoarthritis Mother    Hypertension Mother    Migraines Mother    Colon polyps Father    Hearing loss Father    Other Father        Colon adenoma   Hyperlipidemia Father    GER disease Father    Heart murmur Father    Amblyopia Father        Right eye   Neuropathy Father    Glaucoma Maternal Grandmother    Macular degeneration Maternal Grandmother    Multiple sclerosis Paternal Grandmother    Breast cancer Neg Hx    Colon cancer Neg Hx    Esophageal cancer Neg Hx    Rectal cancer Neg Hx    Stomach cancer Neg Hx     Current Outpatient Medications  Medication Sig Dispense Refill   ascorbic acid (VITAMIN C) 1000 MG tablet Take 1 tablet by mouth daily.     cholecalciferol (VITAMIN D3) 25 MCG (1000 UNIT) tablet Take 5,000 Units by mouth daily.     furosemide (LASIX) 20 MG tablet Take 1 tablet (20 mg total) by mouth daily. 90 tablet 2   levothyroxine (SYNTHROID) 75 MCG tablet Take 1 tablet (75 mcg total) by mouth daily before breakfast. 90 tablet 3   MAGNESIUM GLUCONATE PO Take 2 tablets by mouth daily at 12 noon.     omeprazole (PRILOSEC) 20 MG capsule Take 1 capsule (20 mg total) by mouth daily. TAKE 1 CAPSULE BY MOUTH DAILY 90 capsule 3   Probiotic Product (CULTRELLE KIDS IMMUNE DEFENSE PO) Take 1 tablet by mouth daily.     tamoxifen (NOLVADEX) 10 MG tablet Take 1 tablet (10 mg total) by mouth daily. 90 tablet 3   Tocilizumab (ACTEMRA) 162 MG/0.9ML SOSY For MOGAD     UNABLE TO FIND Take 4 tablets by mouth daily. Med Name: Omega Pure 2  tablet in AM and 2 tablet in PM     UNABLE TO FIND Take by mouth daily. Med Name: Ultra Inflamix 2 scoops daily     UNABLE TO FIND Take 2 tablets by mouth daily. Med Name: R-Lipoic Acid     No current facility-administered medications for this visit.    No  Known Allergies  REVIEW OF SYSTEMS (negative unless checked):   Cardiac:  '[]'$  Chest pain or chest pressure? '[]'$  Shortness of breath upon activity? '[]'$  Shortness of breath when lying flat? '[]'$  Irregular heart rhythm?  Vascular:  '[]'$  Pain in calf, thigh, or hip brought on by walking? '[]'$  Pain in feet at night that wakes you up from your sleep? '[]'$  Blood clot in your veins? '[x]'$  Leg swelling?  Pulmonary:  '[]'$  Oxygen at home? '[]'$  Productive cough? '[]'$  Wheezing?  Neurologic:  '[]'$  Sudden weakness in arms or legs? '[]'$  Sudden numbness in arms or legs? '[]'$  Sudden onset of difficult speaking or slurred speech? '[]'$  Temporary loss of vision in one eye? '[]'$  Problems with dizziness?  Gastrointestinal:  '[]'$  Blood in stool? '[]'$  Vomited blood?  Genitourinary:  '[]'$  Burning when urinating? '[]'$  Blood in urine?  Psychiatric:  '[]'$  Major depression  Hematologic:  '[]'$  Bleeding problems? '[]'$  Problems with blood clotting?  Dermatologic:  '[]'$  Rashes or ulcers?  Constitutional:  '[]'$  Fever or chills?  Ear/Nose/Throat:  '[]'$  Change in hearing? '[]'$  Nose bleeds? '[]'$  Sore throat?  Musculoskeletal:  '[]'$  Back pain? '[]'$  Joint pain? '[]'$  Muscle pain?   Physical Examination     Vitals:   03/24/22 0813  BP: 107/73  Pulse: 73  Resp: 14  Temp: 97.7 F (36.5 C)  TempSrc: Temporal  SpO2: 95%  Weight: 172 lb (78 kg)  Height: 5' 5.5" (1.664 m)   Body mass index is 28.19 kg/m.  General:  WDWN in NAD; vital signs documented above Gait: Not observed HENT: WNL, normocephalic Pulmonary: normal non-labored breathing, CTAB Cardiac: NSR without murmur, no carotid bruit Abdomen: soft, NT, no masses Skin: without rashes Vascular Exam/Pulses: 2+ DP pulses  bilaterally Extremities: without varicose veins, without stasis pigmentation, without ulcers. Trace ankle edema bilaterally. Some small spider veins across bilateral shins Musculoskeletal: no muscle wasting or atrophy  Neurologic: A&O X 3;  No focal weakness or paresthesias are detected Psychiatric:  The pt has Normal affect.  Non-invasive Vascular Imaging   LLE Venous Insufficiency Duplex (03/24/2022):   LEFT          Reflux NoRefluxReflux TimeDiameter cmsComments                          Yes                                   +--------------+---------+------+-----------+------------+--------+  CFV          no                                              +--------------+---------+------+-----------+------------+--------+  FV prox       no                                              +--------------+---------+------+-----------+------------+--------+  FV mid        no                                              +--------------+---------+------+-----------+------------+--------+  Popliteal    no                                              +--------------+---------+------+-----------+------------+--------+  GSV at [facility]    no                            0.78              +--------------+---------+------+-----------+------------+--------+  GSV prox thighno                           0.477              +--------------+---------+------+-----------+------------+--------+  GSV mid thigh no                           0.308              +--------------+---------+------+-----------+------------+--------+  GSV dist thighno                           0.383              +--------------+---------+------+-----------+------------+--------+  GSV at knee   no                           0.297              +--------------+---------+------+-----------+------------+--------+  GSV prox calf           yes    >500 ms     0.379               +--------------+---------+------+-----------+------------+--------+  GSV mid calf            yes    >500 ms      0.26              +--------------+---------+------+-----------+------------+--------+  SSV Pop Fossa no                           0.436              +--------------+---------+------+-----------+------------+--------+  SSV prox calf no                           0.318              +--------------+---------+------+-----------+------------+--------+  SSV mid calf  no                           0.334              +--------------+---------+------+-----------+------------+--------+    Medical Decision Making   Murphy Bundick is a 51 y.o. female who presents with bilateral lower extremity edema  Based on the patient's reflux study, there is reflux in the left GSV in the proximal and mid calf. The rest of the deep and superficial venous system is competent. There is also no evidence of DVT or SVT. The patient has bilateral and equal lower extremity swelling mostly limited to the ankles. I believe this could be due to venous insufficiency, as seen on duplex. She would not be a  candidate for GSV ablation since her GSV is competent in the thigh. I believe she would benefit from continued conservative therapy, including compression stockings, elevation, and avoiding prolonged sitting and standing. She can follow up with our office as needed   Gerri Lins, PA-C [facility]: [phone]  03/24/2022, 8:22 AM  Clinic MD: Trula Slade

## 2022-03-31 ENCOUNTER — Other Ambulatory Visit: Payer: Commercial Managed Care - PPO

## 2022-04-01 ENCOUNTER — Encounter: Payer: Self-pay | Admitting: Family Medicine

## 2022-04-02 NOTE — Telephone Encounter (Signed)
Please see pt msg as FYI 

## 2022-04-03 ENCOUNTER — Encounter (HOSPITAL_BASED_OUTPATIENT_CLINIC_OR_DEPARTMENT_OTHER): Payer: Self-pay | Admitting: Obstetrics & Gynecology

## 2022-04-03 ENCOUNTER — Ambulatory Visit
Admission: RE | Admit: 2022-04-03 | Discharge: 2022-04-03 | Disposition: A | Discharge status: Home / Self Care | Payer: Commercial Managed Care - PPO | Source: Ambulatory Visit | Attending: Surgery | Admitting: Surgery

## 2022-04-03 ENCOUNTER — Encounter (HOSPITAL_BASED_OUTPATIENT_CLINIC_OR_DEPARTMENT_OTHER): Payer: Self-pay

## 2022-04-03 DIAGNOSIS — C50912 Malignant neoplasm of unspecified site of left female breast: Secondary | ICD-10-CM

## 2022-04-03 HISTORY — DX: Personal history of irradiation: Z92.3

## 2022-04-09 ENCOUNTER — Ambulatory Visit (HOSPITAL_BASED_OUTPATIENT_CLINIC_OR_DEPARTMENT_OTHER)
Admission: RE | Admit: 2022-04-09 | Discharge: 2022-04-09 | Disposition: A | Discharge status: Home / Self Care | Payer: Commercial Managed Care - PPO | Source: Ambulatory Visit | Attending: Obstetrics & Gynecology | Admitting: Obstetrics & Gynecology

## 2022-04-09 ENCOUNTER — Encounter (HOSPITAL_BASED_OUTPATIENT_CLINIC_OR_DEPARTMENT_OTHER): Payer: Self-pay | Admitting: Obstetrics & Gynecology

## 2022-04-09 ENCOUNTER — Ambulatory Visit (INDEPENDENT_AMBULATORY_CARE_PROVIDER_SITE_OTHER): Payer: Commercial Managed Care - PPO | Admitting: Obstetrics & Gynecology

## 2022-04-09 VITALS — BP 118/70 | HR 75 | Ht 65.5 in | Wt 180.0 lb

## 2022-04-09 DIAGNOSIS — R9389 Abnormal findings on diagnostic imaging of other specified body structures: Secondary | ICD-10-CM

## 2022-04-09 DIAGNOSIS — C50312 Malignant neoplasm of lower-inner quadrant of left female breast: Secondary | ICD-10-CM | POA: Diagnosis not present

## 2022-04-09 DIAGNOSIS — N921 Excessive and frequent menstruation with irregular cycle: Secondary | ICD-10-CM

## 2022-04-09 DIAGNOSIS — N83201 Unspecified ovarian cyst, right side: Secondary | ICD-10-CM | POA: Diagnosis present

## 2022-04-09 DIAGNOSIS — Z79899 Other long term (current) drug therapy: Secondary | ICD-10-CM | POA: Diagnosis present

## 2022-04-09 DIAGNOSIS — Z170 Estrogen receptor positive status [ER+]: Secondary | ICD-10-CM

## 2022-04-10 ENCOUNTER — Encounter (HOSPITAL_BASED_OUTPATIENT_CLINIC_OR_DEPARTMENT_OTHER): Payer: Self-pay

## 2022-04-11 ENCOUNTER — Encounter: Payer: Self-pay | Admitting: Hematology and Oncology

## 2022-04-12 NOTE — Progress Notes (Signed)
GYNECOLOGY  VISIT  CC:   discuss surgical options  HPI: 51 y.o. [id number] [name] here for discussion of surgical options.  Pt has decided she does not want to proceed with hysteroscopy but would like more definitive treatment with hysterectomy.  She does not to keep having bleeding and does not want a minor procedure that might need treatment again in the future.  With hx of breast cancer, she does have more anxiety about possible future cancer.  Is not interested in future child bearing.  We discussed removal of ovaries as well as need for change in breast cancer treatment if ovaries removed.  She is ok with this.  Recommended reaching out to oncology as well.  Hysterectomy procedure discussed with patient.  Hospital stay, recovery and pain management all discussed.  Risks discussed including but not limited to bleeding, 1% risk of receiving a  transfusion, infection, 3-4% risk of bowel/bladder/ureteral/vascular injury discussed as well as possible need for additional surgery if injury does occur discussed.  DVT/PE and rare risk of death discussed.  My actual complications with prior surgeries discussed.  Vaginal cuff dehiscence discussed.  Hernia formation discussed.  Positioning and incision locations discussed.  Patient aware if pathology abnormal she may need additional treatment.  All questions answered and she does want to change her procedure to more definitive option at this point.  She is aware I will communicate with our surgery scheduler to see what we can accommodate in short amount of time.   Past Medical History:  Diagnosis Date   Cancer ([place]) 09/2020   left breast   GERD (gastroesophageal reflux disease)    due to prednisone   Hashimoto's disease    History of radiation therapy 2022   completed 01-18-2021   Mitral regurgitation    mild by echo 06/2020   Myelin oligodendrocyte glycoprotein antibody disorder (MOGAD)    Optic neuritis    Personal history of  radiation therapy    PONV (postoperative nausea and vomiting)     MEDS:   Current Outpatient Medications on File Prior to Visit  Medication Sig Dispense Refill   ascorbic acid (VITAMIN C) 1000 MG tablet Take 1 tablet by mouth daily.     cholecalciferol (VITAMIN D3) 25 MCG (1000 UNIT) tablet Take 5,000 Units by mouth daily.     furosemide (LASIX) 20 MG tablet Take 1 tablet (20 mg total) by mouth daily. 90 tablet 2   levothyroxine (SYNTHROID) 75 MCG tablet Take 1 tablet (75 mcg total) by mouth daily before breakfast. 90 tablet 3   MAGNESIUM GLUCONATE PO Take 2 tablets by mouth daily at 12 noon.     omeprazole (PRILOSEC) 20 MG capsule Take 1 capsule (20 mg total) by mouth daily. TAKE 1 CAPSULE BY MOUTH DAILY 90 capsule 3   Probiotic Product (CULTRELLE KIDS IMMUNE DEFENSE PO) Take 1 tablet by mouth daily.     tamoxifen (NOLVADEX) 10 MG tablet Take 1 tablet (10 mg total) by mouth daily. 90 tablet 3   Tocilizumab (ACTEMRA) 162 MG/0.9ML SOSY For MOGAD     UNABLE TO FIND Take 4 tablets by mouth daily. Med Name: Omega Pure 2 tablet in AM and 2 tablet in PM     UNABLE TO FIND Take by mouth daily. Med Name: Ultra Inflamix 2 scoops daily     UNABLE TO FIND Take 2 tablets by mouth daily. Med Name: R-Lipoic Acid     No current facility-administered medications on file prior to visit.  ALLERGIES: Patient has no known allergies.  SH:  married, non smoker  Review of Systems  Constitutional: Negative.   Genitourinary:        Irregular and heavy bleeding    PHYSICAL EXAMINATION:    BP 118/70 (BP Location: Right Arm, Patient Position: Sitting, Cuff Size: Large)   Pulse 75   Ht 5' 5.5" (1.664 m) Comment: Reported  Wt 180 lb (81.6 kg)   BMI 29.50 kg/m     General appearance: alert, cooperative and appears stated age   Assessment/Plan: 1. Menorrhagia with irregular cycle - will change procedure to TLH/bilateral salpingectomy/possible oophorectomy, cystoscopy.    2. Thickened  endometrium  3. Malignant neoplasm of lower-inner quadrant of left breast in female, estrogen receptor positive ([place])  4. High risk medication use - on tamoxifen  Total time with pt:  35 minutes.  4 minutes spend on additional documentation.

## 2022-04-14 ENCOUNTER — Other Ambulatory Visit: Payer: Self-pay | Admitting: Internal Medicine

## 2022-04-14 DIAGNOSIS — E038 Other specified hypothyroidism: Secondary | ICD-10-CM

## 2022-04-15 ENCOUNTER — Encounter (HOSPITAL_BASED_OUTPATIENT_CLINIC_OR_DEPARTMENT_OTHER): Payer: Self-pay | Admitting: Obstetrics & Gynecology

## 2022-04-16 ENCOUNTER — Other Ambulatory Visit: Payer: Self-pay

## 2022-04-16 ENCOUNTER — Encounter (HOSPITAL_BASED_OUTPATIENT_CLINIC_OR_DEPARTMENT_OTHER): Payer: Self-pay | Admitting: Obstetrics & Gynecology

## 2022-04-16 DIAGNOSIS — Z01812 Encounter for preprocedural laboratory examination: Secondary | ICD-10-CM | POA: Diagnosis not present

## 2022-04-16 DIAGNOSIS — E038 Other specified hypothyroidism: Secondary | ICD-10-CM

## 2022-04-16 MED ORDER — LEVOTHYROXINE SODIUM 88 MCG PO TABS: 88.0000 ug | ORAL_TABLET | Freq: Every day | ORAL | 1 refills | Status: DC

## 2022-04-16 NOTE — Progress Notes (Signed)
Spoke w/ via phone for pre-op interview---Valerie [name] needs dos---- urine pregnancy per anesthesia, surgeon orders pending as of 04/16/2022              [name] results------04/17/21 [name] appt for cbc, bmp, type & screen COVID test -----patient states asymptomatic no test needed Arrive at -------0945 on Wednesday, 04/23/22 NPO after MN NO Solid Food.  Clear liquids from MN until---0845 Med rec completed Medications to take morning of surgery -----Synthroid, Prilosec Diabetic medication -----n/a Patient instructed no nail polish to be worn day of surgery Patient instructed to bring photo id and insurance card day of surgery Patient aware to have Driver (ride ) / caregiver    for 24 hours after surgery - husband, Valerie [name] Patient Special Instructions -----Extended / overnight stay instructions given. Pre-Op special Istructions -----Requested orders from Dr. Sabra Heck via Epic IB on 04/15/22. NO BP or NS in left arm. Patient verbalized understanding of instructions that were given at this phone interview. Patient denies shortness of breath, chest pain, fever, cough at this phone interview.

## 2022-04-16 NOTE — Progress Notes (Signed)
Your procedure is scheduled on Wednesday, 04/23/2022.  Report to [facility]   Call this number if you have problems the morning of surgery  [phone].   OUR ADDRESS IS [place].  WE ARE LOCATED IN [facility].  PLEASE BRING YOUR INSURANCE CARD AND PHOTO ID DAY OF SURGERY.  ONLY 2 PEOPLE ARE ALLOWED IN  WAITING  ROOM.                                      REMEMBER:  DO NOT EAT FOOD, CANDY GUM OR MINTS  AFTER MIDNIGHT THE NIGHT BEFORE YOUR SURGERY . YOU MAY HAVE CLEAR LIQUIDS FROM MIDNIGHT THE NIGHT BEFORE YOUR SURGERY UNTIL  8:45 AM. NO CLEAR LIQUIDS AFTER   8:45 AM DAY OF SURGERY.  YOU MAY  BRUSH YOUR TEETH MORNING OF SURGERY AND RINSE YOUR MOUTH OUT, NO CHEWING GUM CANDY OR MINTS.     CLEAR LIQUID DIET   Foods Allowed                                                                     Foods Excluded  Coffee and tea, regular and decaf                             liquids that you cannot  Plain Jell-O                                                                   see through such as: Fruit ices (not with fruit pulp)                                     milk, soups, orange juice  Plain  Popsicles                                    All solid food Carbonated beverages, regular and diet                                    Cranberry, grape and apple juices Sports drinks like Gatorade _____________________________________________________________________     TAKE ONLY THESE MEDICATIONS MORNING OF SURGERY: Synthroid & Prilosec    UP TO 4 VISITORS  MAY VISIT IN THE EXTENDED RECOVERY ROOM UNTIL 800 PM ONLY.  ONE  VISITOR AGE 48 AND OVER MAY SPEND THE NIGHT AND MUST BE IN EXTENDED RECOVERY ROOM NO LATER THAN 800 PM . YOUR DISCHARGE TIME AFTER YOU SPEND THE NIGHT IS 900 AM THE MORNING AFTER YOUR SURGERY.  YOU MAY PACK A SMALL OVERNIGHT BAG WITH TOILETRIES FOR YOUR  OVERNIGHT STAY IF YOU WISH.  YOUR PRESCRIPTION  MEDICATIONS WILL BE PROVIDED DURING [place].                                      DO NOT WEAR JEWERLY, MAKE UP. DO NOT WEAR LOTIONS, POWDERS, PERFUMES OR NAIL POLISH ON YOUR FINGERNAILS. TOENAIL POLISH IS OK TO WEAR. DO NOT SHAVE FOR 48 HOURS PRIOR TO DAY OF SURGERY. MEN MAY SHAVE FACE AND NECK. CONTACTS, GLASSES, OR DENTURES MAY NOT BE WORN TO SURGERY.  REMEMBER: NO SMOKING, DRUGS OR ALCOHOL FOR 24 HOURS BEFORE YOUR SURGERY.                                    [facility] IS NOT RESPONSIBLE  FOR ANY BELONGINGS.                                                                    Marland Kitchen           [facility] - Preparing for Surgery Before surgery, you can play an important role.  Because skin is not sterile, your skin needs to be as free of germs as possible.  You can reduce the number of germs on your skin by washing with CHG (chlorahexidine gluconate) soap before surgery.  CHG is an antiseptic cleaner which kills germs and bonds with the skin to continue killing germs even after washing. Please DO NOT use if you have an allergy to CHG or antibacterial soaps.  If your skin becomes reddened/irritated stop using the CHG and inform your nurse when you arrive at Short Stay. Do not shave (including legs and underarms) for at least 48 hours prior to the first CHG shower.  You may shave your face/neck. Please follow these instructions carefully:  1.  Shower with CHG Soap the night before surgery and the  morning of Surgery.  2.  If you choose to wash your hair, wash your hair first as usual with your  normal  shampoo.  3.  After you shampoo, rinse your hair and body thoroughly to remove the  shampoo.                                        4.  Use CHG as you would any other liquid soap.  You can apply chg directly  to the skin and wash , chg soap provided, night before and morning of your surgery.  5.  Apply the CHG Soap to your body ONLY FROM THE NECK DOWN.   Do not use on face/ open                            Wound or open sores. Avoid contact with eyes, ears mouth and genitals (private parts).                       Wash face,  Genitals (private parts) with your normal soap.  6.  Wash thoroughly, paying special attention to the area where your surgery  will be performed.  7.  Thoroughly rinse your body with warm water from the neck down.  8.  DO NOT shower/wash with your normal soap after using and rinsing off  the CHG Soap.             9.  Pat yourself dry with a clean towel.            10.  Wear clean pajamas.            11.  Place clean sheets on your bed the night of your first shower and do not  sleep with pets. Day of Surgery : Do not apply any lotions/deodorants the morning of surgery.  Please wear clean clothes to the hospital/surgery center.  IF YOU HAVE ANY SKIN IRRITATION OR PROBLEMS WITH THE SURGICAL SOAP, PLEASE GET A BAR OF GOLD DIAL SOAP AND SHOWER THE NIGHT BEFORE YOUR SURGERY AND THE MORNING OF YOUR SURGERY. PLEASE LET THE NURSE KNOW MORNING OF YOUR SURGERY IF YOU HAD ANY PROBLEMS WITH THE SURGICAL SOAP.   ________________________________________________________________________                                                        QUESTIONS [facility] PRE OP NURSE PHONE [phone].

## 2022-04-17 ENCOUNTER — Encounter (HOSPITAL_COMMUNITY)
Admission: RE | Admit: 2022-04-17 | Discharge: 2022-04-17 | Disposition: A | Discharge status: Home / Self Care | Payer: Commercial Managed Care - PPO | Source: Ambulatory Visit | Attending: Obstetrics & Gynecology | Admitting: Obstetrics & Gynecology

## 2022-04-17 DIAGNOSIS — Z01812 Encounter for preprocedural laboratory examination: Secondary | ICD-10-CM | POA: Insufficient documentation

## 2022-04-17 DIAGNOSIS — Z01818 Encounter for other preprocedural examination: Secondary | ICD-10-CM

## 2022-04-17 LAB — CBC
HCT: 43.7 % (ref 36.0–46.0)
Hemoglobin: 14.4 g/dL (ref 12.0–15.0)
MCH: 32.4 pg (ref 26.0–34.0)
MCHC: 33 g/dL (ref 30.0–36.0)
MCV: 98.4 fL (ref 80.0–100.0)
Platelets: 169 10*3/uL (ref 150–400)
RBC: 4.44 MIL/uL (ref 3.87–5.11)
RDW: 11.6 % (ref 11.5–15.5)
WBC: 4 10*3/uL (ref 4.0–10.5)
nRBC: 0 % (ref 0.0–0.2)

## 2022-04-17 LAB — BASIC METABOLIC PANEL
Anion gap: 8 (ref 5–15)
BUN: 19 mg/dL (ref 6–20)
CO2: 24 mmol/L (ref 22–32)
Calcium: 8.4 mg/dL — ABNORMAL LOW (ref 8.9–10.3)
Chloride: 107 mmol/L (ref 98–111)
Creatinine, Ser: 0.87 mg/dL (ref 0.44–1.00)
GFR, Estimated: >60 mL/min (ref >60)
Glucose, Bld: 99 mg/dL (ref 70–99)
Potassium: 4.7 mmol/L (ref 3.5–5.1)
Sodium: 139 mmol/L (ref 135–145)

## 2022-04-18 ENCOUNTER — Encounter (HOSPITAL_BASED_OUTPATIENT_CLINIC_OR_DEPARTMENT_OTHER): Payer: Self-pay | Admitting: Obstetrics & Gynecology

## 2022-04-21 ENCOUNTER — Other Ambulatory Visit (HOSPITAL_BASED_OUTPATIENT_CLINIC_OR_DEPARTMENT_OTHER): Payer: Self-pay | Admitting: Obstetrics & Gynecology

## 2022-04-21 DIAGNOSIS — Z01818 Encounter for other preprocedural examination: Secondary | ICD-10-CM

## 2022-04-21 DIAGNOSIS — Z170 Estrogen receptor positive status [ER+]: Secondary | ICD-10-CM

## 2022-04-21 DIAGNOSIS — N921 Excessive and frequent menstruation with irregular cycle: Secondary | ICD-10-CM

## 2022-04-21 DIAGNOSIS — N83201 Unspecified ovarian cyst, right side: Secondary | ICD-10-CM

## 2022-04-23 ENCOUNTER — Ambulatory Visit (HOSPITAL_BASED_OUTPATIENT_CLINIC_OR_DEPARTMENT_OTHER): Payer: Commercial Managed Care - PPO | Admitting: Anesthesiology

## 2022-04-23 ENCOUNTER — Encounter (HOSPITAL_BASED_OUTPATIENT_CLINIC_OR_DEPARTMENT_OTHER)
Admission: RE | Disposition: A | Discharge status: Home / Self Care | Payer: Self-pay | Source: Ambulatory Visit | Attending: Obstetrics & Gynecology

## 2022-04-23 ENCOUNTER — Other Ambulatory Visit: Payer: Self-pay

## 2022-04-23 ENCOUNTER — Encounter (HOSPITAL_BASED_OUTPATIENT_CLINIC_OR_DEPARTMENT_OTHER): Payer: Self-pay | Admitting: Obstetrics & Gynecology

## 2022-04-23 ENCOUNTER — Ambulatory Visit (HOSPITAL_BASED_OUTPATIENT_CLINIC_OR_DEPARTMENT_OTHER)
Admission: RE | Admit: 2022-04-23 | Discharge: 2022-04-24 | Disposition: A | Discharge status: Home / Self Care | Payer: Commercial Managed Care - PPO | Source: Ambulatory Visit | Attending: Obstetrics & Gynecology | Admitting: Obstetrics & Gynecology

## 2022-04-23 DIAGNOSIS — N921 Excessive and frequent menstruation with irregular cycle: Secondary | ICD-10-CM | POA: Diagnosis not present

## 2022-04-23 DIAGNOSIS — N840 Polyp of corpus uteri: Secondary | ICD-10-CM | POA: Diagnosis not present

## 2022-04-23 DIAGNOSIS — D271 Benign neoplasm of left ovary: Secondary | ICD-10-CM

## 2022-04-23 DIAGNOSIS — K219 Gastro-esophageal reflux disease without esophagitis: Secondary | ICD-10-CM | POA: Diagnosis not present

## 2022-04-23 DIAGNOSIS — Z8542 Personal history of malignant neoplasm of other parts of uterus: Secondary | ICD-10-CM | POA: Diagnosis not present

## 2022-04-23 DIAGNOSIS — Z8049 Family history of malignant neoplasm of other genital organs: Secondary | ICD-10-CM

## 2022-04-23 DIAGNOSIS — E039 Hypothyroidism, unspecified: Secondary | ICD-10-CM | POA: Insufficient documentation

## 2022-04-23 DIAGNOSIS — N72 Inflammatory disease of cervix uteri: Secondary | ICD-10-CM

## 2022-04-23 DIAGNOSIS — R9389 Abnormal findings on diagnostic imaging of other specified body structures: Secondary | ICD-10-CM

## 2022-04-23 DIAGNOSIS — N8501 Benign endometrial hyperplasia: Secondary | ICD-10-CM

## 2022-04-23 DIAGNOSIS — R935 Abnormal findings on diagnostic imaging of other abdominal regions, including retroperitoneum: Secondary | ICD-10-CM

## 2022-04-23 DIAGNOSIS — Z853 Personal history of malignant neoplasm of breast: Secondary | ICD-10-CM | POA: Insufficient documentation

## 2022-04-23 DIAGNOSIS — Z79899 Other long term (current) drug therapy: Secondary | ICD-10-CM | POA: Insufficient documentation

## 2022-04-23 DIAGNOSIS — E063 Autoimmune thyroiditis: Secondary | ICD-10-CM | POA: Diagnosis not present

## 2022-04-23 DIAGNOSIS — N852 Hypertrophy of uterus: Secondary | ICD-10-CM

## 2022-04-23 DIAGNOSIS — N920 Excessive and frequent menstruation with regular cycle: Secondary | ICD-10-CM | POA: Insufficient documentation

## 2022-04-23 DIAGNOSIS — Z923 Personal history of irradiation: Secondary | ICD-10-CM | POA: Diagnosis not present

## 2022-04-23 DIAGNOSIS — N888 Other specified noninflammatory disorders of cervix uteri: Secondary | ICD-10-CM

## 2022-04-23 DIAGNOSIS — Z01818 Encounter for other preprocedural examination: Secondary | ICD-10-CM

## 2022-04-23 DIAGNOSIS — Z79890 Hormone replacement therapy (postmenopausal): Secondary | ICD-10-CM | POA: Diagnosis not present

## 2022-04-23 HISTORY — DX: Edema, unspecified: R60.9

## 2022-04-23 HISTORY — PX: CYSTOSCOPY: SHX5120

## 2022-04-23 HISTORY — DX: Presence of spectacles and contact lenses: Z97.3

## 2022-04-23 HISTORY — DX: Personal history of other diseases of the digestive system: Z87.19

## 2022-04-23 HISTORY — PX: TOTAL LAPAROSCOPIC HYSTERECTOMY WITH SALPINGECTOMY: SHX6742

## 2022-04-23 HISTORY — DX: Lymphedema, not elsewhere classified: I89.0

## 2022-04-23 LAB — TYPE AND SCREEN
ABO/RH(D): A POS
Antibody Screen: NEGATIVE

## 2022-04-23 LAB — POCT PREGNANCY, URINE: Preg Test, Ur: NEGATIVE

## 2022-04-23 LAB — ABO/RH: ABO/RH(D): A POS

## 2022-04-23 SURGERY — HYSTERECTOMY, TOTAL, LAPAROSCOPIC, WITH SALPINGECTOMY
Anesthesia: General | Site: Bladder

## 2022-04-23 MED ORDER — ROCURONIUM BROMIDE 100 MG/10ML IV SOLN
INTRAVENOUS | Status: DC | PRN
  Administered 2022-04-23: 50 mg via INTRAVENOUS
  Administered 2022-04-23: 10 mg via INTRAVENOUS
  Administered 2022-04-23: 20 mg via INTRAVENOUS
  Administered 2022-04-23 (×2): 10 mg via INTRAVENOUS

## 2022-04-23 MED ORDER — GABAPENTIN 100 MG PO CAPS
100.0000 mg | ORAL_CAPSULE | Freq: Three times a day (TID) | ORAL | Status: DC
  Administered 2022-04-23 – 2022-04-24 (×2): 100 mg via ORAL

## 2022-04-23 MED ORDER — OXYCODONE-ACETAMINOPHEN 5-325 MG PO TABS: 1.0000 | ORAL_TABLET | ORAL | Status: DC | PRN

## 2022-04-23 MED ORDER — LIDOCAINE HCL (CARDIAC) PF 100 MG/5ML IV SOSY
PREFILLED_SYRINGE | INTRAVENOUS | Status: DC | PRN
  Administered 2022-04-23: 50 mg via INTRAVENOUS

## 2022-04-23 MED ORDER — FENTANYL CITRATE (PF) 100 MCG/2ML IJ SOLN
INTRAMUSCULAR | Status: DC | PRN
  Administered 2022-04-23 (×2): 50 ug via INTRAVENOUS

## 2022-04-23 MED ORDER — SODIUM CHLORIDE 0.9 % IR SOLN
Status: DC | PRN
  Administered 2022-04-23: 350 mL

## 2022-04-23 MED ORDER — FENTANYL CITRATE (PF) 100 MCG/2ML IJ SOLN: 25.0000 ug | INTRAMUSCULAR | Status: DC | PRN

## 2022-04-23 MED ORDER — HEMOSTATIC AGENTS (NO CHARGE) OPTIME
TOPICAL | Status: DC | PRN
  Administered 2022-04-23: 1 via TOPICAL

## 2022-04-23 MED ORDER — SODIUM CHLORIDE 0.9 % IV SOLN
INTRAVENOUS | Status: DC | PRN
  Administered 2022-04-23: 60 mL

## 2022-04-23 MED ORDER — SODIUM CHLORIDE 0.9 % IV SOLN
INTRAVENOUS | Status: AC
  Filled 2022-04-23: qty 2

## 2022-04-23 MED ORDER — ONDANSETRON HCL 4 MG/2ML IJ SOLN
INTRAMUSCULAR | Status: DC | PRN
  Administered 2022-04-23: 4 mg via INTRAVENOUS

## 2022-04-23 MED ORDER — GABAPENTIN 100 MG PO CAPS
ORAL_CAPSULE | ORAL | Status: AC
  Filled 2022-04-23: qty 1

## 2022-04-23 MED ORDER — PANTOPRAZOLE SODIUM 40 MG IV SOLR
40.0000 mg | Freq: Every day | INTRAVENOUS | Status: DC
  Administered 2022-04-23: 40 mg via INTRAVENOUS

## 2022-04-23 MED ORDER — PROPOFOL 500 MG/50ML IV EMUL
INTRAVENOUS | Status: DC | PRN
  Administered 2022-04-23: 50 ug/kg/min via INTRAVENOUS

## 2022-04-23 MED ORDER — 0.9 % SODIUM CHLORIDE (POUR BTL) OPTIME
TOPICAL | Status: DC | PRN
  Administered 2022-04-23: 500 mL

## 2022-04-23 MED ORDER — ENOXAPARIN SODIUM 40 MG/0.4ML IJ SOSY
PREFILLED_SYRINGE | INTRAMUSCULAR | Status: AC
  Filled 2022-04-23: qty 0.4

## 2022-04-23 MED ORDER — ENOXAPARIN SODIUM 40 MG/0.4ML IJ SOSY
40.0000 mg | PREFILLED_SYRINGE | INTRAMUSCULAR | Status: AC
  Administered 2022-04-23: 40 mg via SUBCUTANEOUS

## 2022-04-23 MED ORDER — KETOROLAC TROMETHAMINE 30 MG/ML IJ SOLN
30.0000 mg | Freq: Four times a day (QID) | INTRAMUSCULAR | Status: DC
  Administered 2022-04-23 – 2022-04-24 (×2): 30 mg via INTRAVENOUS

## 2022-04-23 MED ORDER — FENTANYL CITRATE (PF) 100 MCG/2ML IJ SOLN
INTRAMUSCULAR | Status: AC
  Filled 2022-04-23: qty 2

## 2022-04-23 MED ORDER — KETOROLAC TROMETHAMINE 30 MG/ML IJ SOLN
INTRAMUSCULAR | Status: AC
  Filled 2022-04-23: qty 1

## 2022-04-23 MED ORDER — PANTOPRAZOLE SODIUM 40 MG IV SOLR
INTRAVENOUS | Status: AC
  Filled 2022-04-23: qty 10

## 2022-04-23 MED ORDER — OXYCODONE HCL 5 MG PO TABS: 5.0000 mg | ORAL_TABLET | Freq: Once | ORAL | Status: DC | PRN

## 2022-04-23 MED ORDER — SCOPOLAMINE 1 MG/3DAYS TD PT72: 1.0000 | MEDICATED_PATCH | TRANSDERMAL | Status: DC

## 2022-04-23 MED ORDER — PROPOFOL 10 MG/ML IV BOLUS
INTRAVENOUS | Status: DC | PRN
  Administered 2022-04-23: 150 mg via INTRAVENOUS

## 2022-04-23 MED ORDER — MENTHOL 3 MG MT LOZG: 1.0000 | LOZENGE | OROMUCOSAL | Status: DC | PRN

## 2022-04-23 MED ORDER — MORPHINE SULFATE (PF) 4 MG/ML IV SOLN: 1.0000 mg | INTRAVENOUS | Status: DC | PRN

## 2022-04-23 MED ORDER — DEXAMETHASONE SODIUM PHOSPHATE 4 MG/ML IJ SOLN
INTRAMUSCULAR | Status: DC | PRN
  Administered 2022-04-23: 10 mg via INTRAVENOUS

## 2022-04-23 MED ORDER — SUGAMMADEX SODIUM 200 MG/2ML IV SOLN
INTRAVENOUS | Status: DC | PRN
  Administered 2022-04-23: 160 mg via INTRAVENOUS

## 2022-04-23 MED ORDER — ACETAMINOPHEN 500 MG PO TABS
ORAL_TABLET | ORAL | Status: AC
  Filled 2022-04-23: qty 2

## 2022-04-23 MED ORDER — ACETAMINOPHEN 500 MG PO TABS
1000.0000 mg | ORAL_TABLET | ORAL | Status: AC
  Administered 2022-04-23: 1000 mg via ORAL

## 2022-04-23 MED ORDER — MIDAZOLAM HCL 2 MG/2ML IJ SOLN
INTRAMUSCULAR | Status: DC | PRN
  Administered 2022-04-23: 2 mg via INTRAVENOUS

## 2022-04-23 MED ORDER — LEVOTHYROXINE SODIUM 100 MCG PO TABS
100.0000 ug | ORAL_TABLET | Freq: Every day | ORAL | Status: DC
  Administered 2022-04-24: 100 ug via ORAL
  Filled 2022-04-23: qty 1

## 2022-04-23 MED ORDER — BUPIVACAINE HCL (PF) 0.25 % IJ SOLN
INTRAMUSCULAR | Status: DC | PRN
  Administered 2022-04-23: 10 mL

## 2022-04-23 MED ORDER — POVIDONE-IODINE 10 % EX SWAB: 2.0000 | Freq: Once | CUTANEOUS | Status: DC

## 2022-04-23 MED ORDER — AMISULPRIDE (ANTIEMETIC) 5 MG/2ML IV SOLN: 10.0000 mg | Freq: Once | INTRAVENOUS | Status: DC | PRN

## 2022-04-23 MED ORDER — ALUM & MAG HYDROXIDE-SIMETH 200-200-20 MG/5ML PO SUSP: 30.0000 mL | ORAL | Status: DC | PRN

## 2022-04-23 MED ORDER — KETOROLAC TROMETHAMINE 30 MG/ML IJ SOLN
INTRAMUSCULAR | Status: DC | PRN
  Administered 2022-04-23: 30 mg via INTRAVENOUS

## 2022-04-23 MED ORDER — GABAPENTIN 100 MG PO CAPS
100.0000 mg | ORAL_CAPSULE | ORAL | Status: AC
  Administered 2022-04-23: 100 mg via ORAL

## 2022-04-23 MED ORDER — MIDAZOLAM HCL 2 MG/2ML IJ SOLN
INTRAMUSCULAR | Status: AC
  Filled 2022-04-23: qty 2

## 2022-04-23 MED ORDER — IBUPROFEN 200 MG PO TABS: 600.0000 mg | ORAL_TABLET | Freq: Four times a day (QID) | ORAL | Status: DC

## 2022-04-23 MED ORDER — ONDANSETRON HCL 4 MG/2ML IJ SOLN: 4.0000 mg | Freq: Four times a day (QID) | INTRAMUSCULAR | Status: DC | PRN

## 2022-04-23 MED ORDER — OXYCODONE HCL 5 MG/5ML PO SOLN: 5.0000 mg | Freq: Once | ORAL | Status: DC | PRN

## 2022-04-23 MED ORDER — SODIUM CHLORIDE 0.9 % IR SOLN
Status: DC | PRN
  Administered 2022-04-23: 300 mL

## 2022-04-23 MED ORDER — ONDANSETRON HCL 4 MG PO TABS: 4.0000 mg | ORAL_TABLET | Freq: Four times a day (QID) | ORAL | Status: DC | PRN

## 2022-04-23 MED ORDER — DEXMEDETOMIDINE HCL IN NACL 80 MCG/20ML IV SOLN
INTRAVENOUS | Status: DC | PRN
  Administered 2022-04-23: 4 ug via BUCCAL
  Administered 2022-04-23: 8 ug via BUCCAL

## 2022-04-23 MED ORDER — DEXTROSE-NACL 5-0.45 % IV SOLN: INTRAVENOUS | Status: DC

## 2022-04-23 MED ORDER — SIMETHICONE 80 MG PO CHEW: 80.0000 mg | CHEWABLE_TABLET | Freq: Four times a day (QID) | ORAL | Status: DC | PRN

## 2022-04-23 MED ORDER — LACTATED RINGERS IV SOLN: INTRAVENOUS | Status: DC

## 2022-04-23 MED ORDER — SODIUM CHLORIDE 0.9 % IV SOLN
2.0000 g | INTRAVENOUS | Status: AC
  Administered 2022-04-23: 2 g via INTRAVENOUS

## 2022-04-23 MED ORDER — LEVOTHYROXINE SODIUM 100 MCG PO TABS: 100.0000 ug | ORAL_TABLET | Freq: Every day | ORAL | Status: DC

## 2022-04-23 SURGICAL SUPPLY — 69 items
ADH SKN CLS APL DERMABOND .7 (GAUZE/BANDAGES/DRESSINGS) ×2
APL PRP STRL LF DISP 70% ISPRP (MISCELLANEOUS) ×2
APL SRG 38 LTWT LNG FL B (MISCELLANEOUS) ×2
APPLICATOR ARISTA FLEXITIP XL (MISCELLANEOUS) IMPLANT
BLADE SURG 10 STRL SS (BLADE) IMPLANT
CABLE HIGH FREQUENCY MONO STRZ (ELECTRODE) IMPLANT
CELL SAVER LIPIGURD (MISCELLANEOUS) IMPLANT
CHLORAPREP W/TINT 26 (MISCELLANEOUS) ×2 IMPLANT
COVER BACK TABLE 60X90IN (DRAPES) ×2 IMPLANT
COVER MAYO STAND STRL (DRAPES) ×2 IMPLANT
COVER SURGICAL LIGHT HANDLE (MISCELLANEOUS) IMPLANT
DERMABOND ADVANCED .7 DNX12 (GAUZE/BANDAGES/DRESSINGS) ×2 IMPLANT
DEVICE RETRIEVAL ALEXIS 14 (MISCELLANEOUS) IMPLANT
DILATOR CANAL MILEX (MISCELLANEOUS) IMPLANT
DRAPE SURG IRRIG POUCH 19X23 (DRAPES) ×2 IMPLANT
DRSG COVADERM PLUS 2X2 (GAUZE/BANDAGES/DRESSINGS) IMPLANT
EXTRT SYSTEM ALEXIS 14CM (MISCELLANEOUS)
EXTRT SYSTEM ALEXIS 17CM (MISCELLANEOUS)
GAUZE 4X4 16PLY ~~LOC~~+RFID DBL (SPONGE) ×4 IMPLANT
GLOVE BIO SURGEON STRL SZ 6.5 (GLOVE) ×2 IMPLANT
GLOVE BIOGEL PI IND STRL 6.5 (GLOVE) ×2 IMPLANT
GLOVE BIOGEL PI IND STRL 7.0 (GLOVE) ×4 IMPLANT
GLOVE ECLIPSE 6.5 STRL STRAW (GLOVE) ×4 IMPLANT
GOWN STRL REUS W/TWL XL LVL3 (GOWN DISPOSABLE) ×4 IMPLANT
HEMOSTAT ARISTA ABSORB 3G PWDR (HEMOSTASIS) IMPLANT
IV NS 1000ML (IV SOLUTION) ×4
IV NS 1000ML BAXH (IV SOLUTION) IMPLANT
KIT PINK PAD W/HEAD ARE REST (MISCELLANEOUS) ×2
KIT PINK PAD W/HEAD ARM REST (MISCELLANEOUS) ×4 IMPLANT
KIT TURNOVER CYSTO (KITS) ×2 IMPLANT
LEGGING LITHOTOMY PAIR STRL (DRAPES) ×2 IMPLANT
LIGASURE VESSEL 5MM BLUNT TIP (ELECTROSURGICAL) ×2 IMPLANT
NDL INSUFFLATION 14GA 120MM (NEEDLE) ×2 IMPLANT
NEEDLE INSUFFLATION 14GA 120MM (NEEDLE) ×2 IMPLANT
NS IRRIG 1000ML POUR BTL (IV SOLUTION) ×2 IMPLANT
NS IRRIG 500ML POUR BTL (IV SOLUTION) IMPLANT
OCCLUDER COLPOPNEUMO (BALLOONS) ×2 IMPLANT
PACK LAPAROSCOPY BASIN (CUSTOM PROCEDURE TRAY) ×2 IMPLANT
PENCIL SMOKE EVACUATOR (MISCELLANEOUS) IMPLANT
POUCH LAPAROSCOPIC INSTRUMENT (MISCELLANEOUS) ×2 IMPLANT
PROTECTOR NERVE ULNAR (MISCELLANEOUS) ×4 IMPLANT
SCISSORS LAP 5X35 DISP (ENDOMECHANICALS) IMPLANT
SET IRRIG Y TYPE TUR BLADDER L (SET/KITS/TRAYS/PACK) ×2 IMPLANT
SET SUCTION IRRIG HYDROSURG (IRRIGATION / IRRIGATOR) ×2 IMPLANT
SET TRI-LUMEN FLTR TB AIRSEAL (TUBING) ×2 IMPLANT
SHEARS HARMONIC ACE PLUS 36CM (ENDOMECHANICALS) ×2 IMPLANT
SUT VIC AB 0 CT1 27 (SUTURE) ×4
SUT VIC AB 0 CT1 27XBRD ANBCTR (SUTURE) ×4 IMPLANT
SUT VIC AB 4-0 PS2 18 (SUTURE) ×2 IMPLANT
SUT VICRYL 0 UR6 27IN ABS (SUTURE) IMPLANT
SUT VICRYL 4-0 PS2 18IN ABS (SUTURE) IMPLANT
SUT VLOC 180 0 9IN  GS21 (SUTURE) ×2
SUT VLOC 180 0 9IN GS21 (SUTURE) ×2 IMPLANT
SYR 10ML LL (SYRINGE) ×2 IMPLANT
SYR 50ML LL SCALE MARK (SYRINGE) ×4 IMPLANT
SYSTEM CARTER THOMASON II (TROCAR) IMPLANT
SYSTEM CONTND EXTRCTN KII BLLN (MISCELLANEOUS) IMPLANT
TIP UTERINE 5.1X6CM LAV DISP (MISCELLANEOUS) IMPLANT
TIP UTERINE 6.7X10CM GRN DISP (MISCELLANEOUS) IMPLANT
TIP UTERINE 6.7X6CM WHT DISP (MISCELLANEOUS) IMPLANT
TIP UTERINE 6.7X8CM BLUE DISP (MISCELLANEOUS) IMPLANT
TOWEL OR 17X26 10 PK STRL BLUE (TOWEL DISPOSABLE) ×4 IMPLANT
TRAY FOLEY W/BAG SLVR 14FR LF (SET/KITS/TRAYS/PACK) ×2 IMPLANT
TROCAR ADV FIXATION 5X100MM (TROCAR) ×2 IMPLANT
TROCAR KII 8X100ML NONTHREADED (TROCAR) ×2 IMPLANT
TROCAR PORT AIRSEAL 5X120 (TROCAR) ×2 IMPLANT
TROCAR Z-THREAD FIOS 5X100MM (TROCAR) ×2 IMPLANT
WARMER LAPAROSCOPE (MISCELLANEOUS) ×2 IMPLANT
WATER STERILE IRR 3000ML UROMA (IV SOLUTION) ×2 IMPLANT

## 2022-04-23 NOTE — Progress Notes (Signed)
Day of Surgery Procedure(s) (LRB): TOTAL LAPAROSCOPIC HYSTERECTOMY BILATERAL SALPINGECTOMY, LEFT OOPHORECTOMY (Bilateral) CYSTOSCOPY (N/A)  Subjective: Patient reports being really cold.  No nausea or pain.  Procedure reviewed. As case started later in the day, feel pt should stay overnight.  She and spouse are ok with this plan.   Objective: I have reviewed patient's vital signs, intake and output, and medications. Vitals:   04/23/22 1739 04/23/22 1845  BP: 120/69 118/64  Pulse: 66 82  Resp: 13 15  Temp: 98 F (36.7 C) 98.4 F (36.9 C)  SpO2: 96% 95%    General: alert and no distress Resp: normal respiratory effort Cardio: regular rate and rhythm   Assessment: s/p Procedure(s): TOTAL LAPAROSCOPIC HYSTERECTOMY BILATERAL SALPINGECTOMY, LEFT OOPHORECTOMY (Bilateral) CYSTOSCOPY (N/A): stable  Plan: Advance diet Encourage ambulation [facility] to PO medication Hb in AM  LOS: 0 days    Megan Salon, MD 04/23/2022, 8:19 PM

## 2022-04-23 NOTE — H&P (Signed)
Valerie [name] is an 52 y.o. female G3 P2 MWF with hx of menorrhagia and endometrial polyp who is here for definitive treatment.  Pt was originally scheduled for hysteroscopy and polyp resection but after considering this, decided that she wanted more definitive treatment as she knows the polyp resection may not improve her bleeding at all.  Pt has under gone evaluation with ultrasound with uterus measuring 11 x 5.5 x 6cm and endometrium was 28m.  Endometrial biopsy was negative as well.  Ovaries did appear normal.  There was a dominant follicle on the left ovary.    Pt has decided she would like to keep her ovaries if they appear normal at the time of surgery.  Risks and benefits have been reviewed and are documented in the last office note.  Pertinent Gynecological History: Menses:  irregular but heavy Contraception: tubal ligation DES exposure: denies Blood transfusions: none Sexually transmitted diseases: no past history Previous GYN Procedures:  none   Last mammogram: normal Date: 04/03/2022 Last pap: normal Date: 02/27/2022 OB History: G3, P2 A1   Menstrual History: Patient's last menstrual period was 12/23/2021 (exact date).    Past Medical History:  Diagnosis Date   Abnormal echocardiogram 11/28/2021   EF 60 -65%, abnormal global longitudinal strain, see cardiology OV in Epic dated 12/25/21   [place]   left breast, ER+, s/p radiation therapy 12/25/20 - 01/18/21, [place] [facility] [place]   COVID-19 11/2019   flu-like symptoms, cough, no hospitalizations   Edema    lower extremity, follows w/ [facility] See OV dated 03/24/22 in Epic.   GERD (gastroesophageal reflux disease)    Follows w/ Dr. YDaleen Bo gastroenterologist and PCP, [facility]   Hashimoto's disease    Follows with Dr. CBenjiman Core@ [facility].   History of hiatal hernia    2 cm hiatal hernia per  01/08/22 EGD   History of radiation therapy 2022   completed 01-18-2021   Hypothyroidism    Follows w/ endocronologist, Dr. CBenjiman Core@ [facility].   Lymphedema    left arm, pt uses lymphedema pump at night   Mitral regurgitation    mild by echo 06/2020   Myelin oligodendrocyte glycoprotein antibody disorder (MOGAD)    [facility]   Optic neuritis    Follows w/ opthamology, Dr. 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   PONV (postoperative nausea and vomiting)    Status post dilation of esophageal narrowing 01/08/2022   EGD, esophagus dilated   Wears glasses     Past Surgical History:  Procedure Laterality Date   BIOPSY BREAST Left 10/17/2020   BREAST LUMPECTOMY WITH RADIOACTIVE SEED AND SENTINEL LYMPH NODE BIOPSY Left 11/15/2020   Procedure: LEFT BREAST LUMPECTOMY WITH RADIOACTIVE SEED AND LEFT SENTINEL LYMPH NODE BIOPSY;  Surgeon: CErroll Luna MD;  [place] [facility]  Service: General;  Laterality: Left;   COLONOSCOPY  09/06/2021   cecal polyp   CT CORONARY CA SCORING  07/13/2020   Coronary Calcium = 0 (in Epic)   ESOPHAGOGASTRODUODENOSCOPY  01/08/2022   2cm hiatal hernia, esophagus dilated   TRANSTHORACIC ECHOCARDIOGRAM  11/28/2021   [place] [facility]  [place]   [place]   WISDOM TOOTH EXTRACTION     early 1990's    Family History  Problem Relation Age of Onset   Hearing loss Mother    Thyroid disease Mother    Cancer Mother 29       endometrial cancer, lynch negative   GER disease Mother    Other Mother        uterine fibroids, optic disc abnormality    Eczema Mother    Colon polyps Mother    Hypothyroidism Mother    Osteoarthritis Mother    Hypertension Mother    Migraines Mother    Colon polyps Father    Hearing loss Father    Other Father        Colon adenoma   Hyperlipidemia Father    GER  disease Father    Heart murmur Father    Amblyopia Father        Right eye   Neuropathy Father    Glaucoma Maternal Grandmother    Macular degeneration Maternal Grandmother    Multiple sclerosis Paternal Grandmother    Breast cancer Neg Hx    Colon cancer Neg Hx    Esophageal cancer Neg Hx    Rectal cancer Neg Hx    Stomach cancer Neg Hx     Social History:  reports that she has never smoked. She does not have any smokeless tobacco history on file. She reports that she does not currently use alcohol. She reports that she does not use drugs.  Allergies: No Known Allergies  Medications Prior to Admission  Medication Sig Dispense Refill Last Dose   ascorbic acid (VITAMIN C) 1000 MG tablet Take 1 tablet by mouth daily.   04/22/2022   ASHWAGANDHA PO Take by mouth.   04/22/2022   cholecalciferol (VITAMIN D3) 25 MCG (1000 UNIT) tablet Take 5,000 Units by mouth daily.   04/22/2022   furosemide (LASIX) 20 MG tablet Take 1 tablet (20 mg total) by mouth daily. (Patient taking differently: Take 20 mg by mouth as needed.) 90 tablet 2 04/22/2022   levothyroxine (SYNTHROID) 88 MCG tablet Take 1 tablet (88 mcg total) by mouth daily before breakfast. 90 tablet 1 04/23/2022 at 0500   MAGNESIUM GLUCONATE PO Take 2 tablets by mouth daily at 12 noon.   04/22/2022   omeprazole (PRILOSEC) 20 MG capsule Take 1 capsule (20 mg total) by mouth daily. TAKE 1 CAPSULE BY MOUTH DAILY 90 capsule 3 04/22/2022   Probiotic Product (CULTRELLE KIDS IMMUNE DEFENSE PO) Take 1 tablet by mouth daily.   04/22/2022   tamoxifen (NOLVADEX) 10 MG tablet Take 1 tablet (10 mg total) by mouth daily. 90 tablet 3 Past Month   UNABLE TO FIND Take 4 tablets by mouth daily. Med Name: Omega Pure 2 tablet in AM and 2 tablet in PM   04/22/2022   UNABLE TO FIND Take by mouth daily. Med Name: Ultra Inflamix 2 scoops daily   Past Month   UNABLE TO FIND Take 2 tablets by mouth daily. Med Name: R-Lipoic Acid   04/22/2022   Tocilizumab (ACTEMRA) 162 MG/0.9ML SOSY  For MOGAD Takes weekly on Saturday.   04/19/2022    Review of Systems  Constitutional: Negative.   Genitourinary:  Positive for menstrual problem.    Blood pressure (!) 127/92, pulse 75, temperature 98 F (36.7 C), temperature source Oral, resp. rate 17, height 5' 5.5" (1.664 m), weight 81.5 kg, last menstrual period 12/23/2021, SpO2 98 %. Physical Exam  Results for orders placed or performed during the hospital encounter of 04/23/22 (from the past 24 hour(s))  Pregnancy, urine POC  Status: None   Collection Time: 04/23/22  9:26 AM  Result Value Ref Range   Preg Test, Ur NEGATIVE NEGATIVE  ABO/Rh     Status: None   Collection Time: 04/23/22 10:06 AM  Result Value Ref Range   ABO/RH(D)      A POS Performed at [facility], [place] [address], [place], [place] [address]     No results found.  Assessment/Plan: 52 yo G3P2 MWF with menorrhagia with irregular menstrual cycles, thickened endometrium here for definitive treatment with TLH/bilateral salpingectomy/cystoscopy.  Possible oophorectomy will be done if there is anything abnormal appearing with her ovaries.  Reviewed again with pt who agrees with plan of care.  Megan Salon 04/23/2022, 1:14 PM

## 2022-04-23 NOTE — Anesthesia Preprocedure Evaluation (Addendum)
Anesthesia Evaluation  Patient identified by MRN, date of birth, ID band Patient awake    Reviewed: Allergy & Precautions, NPO status , Patient's Chart, lab work & pertinent test results  History of Anesthesia Complications (+) PONV and history of anesthetic complications  Airway Mallampati: II  TM Distance: >3 FB Neck ROM: Full    Dental  (+) Dental Advisory Given   Pulmonary neg pulmonary ROS   breath sounds clear to auscultation       Cardiovascular negative cardio ROS  Rhythm:Regular Rate:Normal     Neuro/Psych negative neurological ROS     GI/Hepatic Neg liver ROS, hiatal hernia,GERD  ,,  Endo/Other  Hypothyroidism    Renal/GU negative Renal ROS     Musculoskeletal   Abdominal   Peds  Hematology negative hematology ROS (+)   Anesthesia Other Findings   Reproductive/Obstetrics                             Anesthesia Physical Anesthesia Plan  ASA: 2  Anesthesia Plan: General   Post-op Pain Management: Tylenol PO (pre-op)*, Gabapentin PO (pre-op)* and Toradol IV (intra-op)*   Induction: Intravenous  PONV Risk Score and Plan: 4 or greater and Midazolam, Propofol infusion, Dexamethasone, Ondansetron and Treatment may vary due to age or medical condition  Airway Management Planned: Oral ETT  Additional Equipment: None  Intra-op Plan:   Post-operative Plan: Extubation in OR  Informed Consent: I have reviewed the patients History and Physical, chart, labs and discussed the procedure including the risks, benefits and alternatives for the proposed anesthesia with the patient or authorized representative who has indicated his/her understanding and acceptance.     Dental advisory given  Plan Discussed with: CRNA  Anesthesia Plan Comments:        Anesthesia Quick Evaluation

## 2022-04-23 NOTE — Anesthesia Procedure Notes (Signed)
Procedure Name: Intubation Date/Time: 04/23/2022 1:47 PM  Performed by: Georgeanne Nim, CRNAPre-anesthesia Checklist: Patient identified, Emergency Drugs available, Suction available, Patient being monitored and Timeout performed Patient Re-evaluated:Patient Re-evaluated prior to induction Oxygen Delivery Method: Circle system utilized Preoxygenation: Pre-oxygenation with 100% oxygen Induction Type: IV induction Ventilation: Mask ventilation without difficulty Laryngoscope Size: Mac and 4 Grade View: Grade I Tube type: Oral Tube size: 7.0 mm Number of attempts: 1 Airway Equipment and Method: Stylet Placement Confirmation: positive ETCO2, ETT inserted through vocal cords under direct vision and breath sounds checked- equal and bilateral Secured at: 22 cm Tube secured with: Tape Dental Injury: Teeth and Oropharynx as per pre-operative assessment

## 2022-04-23 NOTE — Op Note (Signed)
04/23/2022  4:01 PM  PATIENT:  Valerie [name]  52 y.o. female  PRE-OPERATIVE DIAGNOSIS:  Menorrhagia Personal H/O Breast Cancer Family H/O Uterine Cancer Thickened Endometrium  POST-OPERATIVE DIAGNOSIS:  Menorrhagia Personal H/O Breast Cancer Family H/O Uterine Cancer Thickened Endometrium  PROCEDURE:  Procedure(s): TOTAL LAPAROSCOPIC HYSTERECTOMY BILATERAL SALPINGECTOMY, LEFT OOPHORECTOMY CYSTOSCOPY  SURGEON:  Megan Salon  ASSISTANTS: Radene Gunning, MD.  An experienced assistant was required given the standard of surgical care given the complexity of the case.  This assistant was needed for exposure, dissection, suctioning, retraction, instrument exchange and for overall help during the procedure.  RNFA help was also unavailable.  ANESTHESIA:   general  ESTIMATED BLOOD LOSS: 25 mL  BLOOD ADMINISTERED:none   FLUIDS: 1300ccLR  UOP: 250cc clear  SPECIMEN:  uterus. Weight in the OR was 255 grams.  Left tube and ovary, right tube  DISPOSITION OF SPECIMEN:  PATHOLOGY  FINDINGS: mildly enlarged uterus, left ovarian cyst that is larger since ultrasound from 04/09/2022   DESCRIPTION OF OPERATION: Patient is taken to the operating room. She is placed in the supine position. She is a running IV in place. Informed consent was present on the chart. SCDs on her lower extremities and functioning properly. Patient was positioned while she was awake.  Her legs were placed in the low lithotomy position in [place]. Her arms were tucked by the side.  General endotracheal anesthesia was administered by the anesthesia staff without difficulty. Dr. Ola Spurr, anesthesia, oversaw case.  Time out performed.    Clora prep was then used to prep the abdomen and Hibiclens was used to prep the inner thighs, perineum and vagina. Once 3 minutes had past the patient was draped in a normal standard fashion. The legs were lifted to the high lithotomy position. The cervix was visualized by placing a  heavy weighted speculum in the posterior aspect of the vagina and using a curved Deaver retractor to the retract anteriorly. The anterior lip of the cervix was grasped with single-tooth tenaculum.  The cervix sounded to 9.5 cm. Pratt dilators were used to dilate the cervix up to a #21. A RUMI uterine manipulator was obtained. A #8 disposable tip was placed on the RUMI manipulator as well as a 4.0, silver KOH ring. This was passed through the cervix and the bulb of the disposable tip was inflated with 10 cc of normal saline. There was a good fit of the KOH ring around the cervix. The tenaculum was removed. There is also good manipulation of the uterus. The speculum and retractor were removed as well. A Foley catheter was placed to straight drain.  Clear urine was noted. Legs were lowered to the low lithotomy position and attention was turned the abdomen.  The umbilicus was everted.  Marcaine 0.25% used to anesthetize the skin.  Using #11 blade, 36m skin incision was made.  A Veress needle was obtained. Syringe of sterile saline was placed on a open Veress needle.  With the abdomen elevated, the Veress needle was passed into the umbilicus until the pop was heard and then fluid started to drip.  Then low flow CO2 gas was attached the needle and the pneumoperitoneum was achieved without difficulty. Once four liters of gas was in the abdomen the Veress needle was removed and a 5 millimeter non-bladed Optiview trocar and port were passed directly to the abdomen. The laparoscope was then used to confirm intraperitoneal placement. Findings included enlarged uterus and enlarged left ovarian cyst.  Locations for RLQ,  LLQ, and suprapubic ports were noted by transillumination of the abdominal wall.  0.25% marcaine was used to anesthetize the skin.  13m skin incision was made in the RLQ and an AirSeal port was placed underdirect visualization of the laparoscope.  Then a 576mskin incision was made and a 108m68monbladed trochar  and port was placed in the LLQ.  Finally, and 8mm58min incision was made about 4cm above the pubic symphasis and an 8mm 5108m-bladed port was placed with direct visualization of the laparoscope.  All trochars were removed.    Ureters were identifies.  Attention was turned to the left side. Due to the enlarged left ovarian cyst, decision was made to remove the ovary and fallopian tube.  With uterus on stretch the left IP ligament was serially clamped, cauterized and incised.  Then the left round ligament was serially clamped cauterized and incised. The anterior and posterior peritoneum of the inferior leaf of the broad ligament were opened. The beginning of the bladder flap was created.  The bladder was taken down below the level of the KOH ring. The left uterine artery skeletonized and then just superior to the KOH ring this vessel was serially clamped, cauterized, and incised.  Attention was turned the right side.  The uterus was placed on stretch to the opposite side.  The tube was excised off the ovary using sharp dissection a bipolar cautery.  The mesosalpinx was incised freeing the tube. Then the right uterine ovarian pedicle was serially clamped cauterized and incised. Next the right round ligament was serially clamped cauterized and incised. The anterior posterior peritoneum of the inferiorly for the broad ligament were opened. The anterior peritoneum was carried across to the dissection on the left side. The remainder of the bladder flap was created using sharp dissection. The bladder was well below the level of the KOH ring. The left uterine artery skeletonized. Then the left uterine artery, above the level of the KOH ring, was serially clamped cauterized and incised. The uterus was devascularized at this point.  The colpotomy was performed a starting in the midline and using a harmonic scalpel with the inferior edge of the open blade  This was carried around a circumferential fashion until the vaginal  mucosa was completely incised in the specimen was freed.  The specimen was then delivered to the vagina.  A vaginal occlusive device was used to maintain the pneumoperitoneum  Instruments were changed with a needle driver and Kobra graspers.  Using a 9 inch V. lock suture, the cuff was closed by incorporating the anterior and posterior vaginal mucosa in each stitch. This was carried across all the way to the left corner and a running fashion. Two stitches were brought back towards the midline and the suture was cut flush with the vagina. The needle was brought out the pelvis. The pelvis was irrigated. All pedicles were inspected. No bleeding was noted.   Co2 pressures were lowered to 8mm H6108m Again, no bleeding was noted.  Ureters were noted deep in the pelvis to be peristalsing.  Arista was placed along the pedicles.  At this point the procedure was completed.  The remaining instruments were removed.  The ports (except the suprapubic port) were removed under direct visualization of the laparoscope and the pneumoperitoneum was relieved.  The patient was taken out of Trendelenburg positioning.  Several deep breaths were given to the patient's trying to any gas the abdomen and finally the suprapubic port was removed.  The skin  was then closed with subcuticular stitches of 3-0 Vicryl. The skin was cleansed Dermabond was applied. Attention was then turned the vagina and the cuff was inspected. No bleeding was noted. The anterior posterior vaginal mucosa was incorporated in each stitch. The Foley catheter was removed.  Cystoscopy was performed.  No sutures or bladder injuries were noted.  Ureters were noted with normal urine jets from each one was seen.  Foley was left out after the cystoscopic fluid was drained and cystoscope removed.  Sponge, lap, needle, instrument counts were correct x2. Patient tolerated the procedure very well. She was awakened from anesthesia, extubated and taken to recovery in stable  condition.   COUNTS:  YES  PLAN OF CARE: Transfer to PACU

## 2022-04-23 NOTE — Transfer of Care (Signed)
Immediate Anesthesia Transfer of Care Note  Patient: Valerie [name]  Procedure(s) Performed: TOTAL LAPAROSCOPIC HYSTERECTOMY BILATERAL SALPINGECTOMY, LEFT OOPHORECTOMY (Bilateral: Abdomen) CYSTOSCOPY (Bladder)  Patient Location: PACU  Anesthesia Type:General  Level of Consciousness: drowsy and patient cooperative  Airway & Oxygen Therapy: Patient Spontanous Breathing and Patient connected to nasal cannula oxygen  Post-op Assessment: Report given to RN and Post -op Vital signs reviewed and stable  Post vital signs: Reviewed and stable  Last Vitals:  Vitals Value Taken Time  BP 104/70 04/23/22 1604  Temp    Pulse 64 04/23/22 1607  Resp 8 04/23/22 1607  SpO2 100 % 04/23/22 1607  Vitals shown include unvalidated device data.  Last Pain:  Vitals:   04/23/22 0952  TempSrc: Oral  PainSc: 0-No pain      Patients Stated Pain Goal: 5 (91/98/02 2179)  Complications: No notable events documented.

## 2022-04-24 ENCOUNTER — Encounter: Payer: Self-pay | Admitting: Neurology

## 2022-04-24 ENCOUNTER — Ambulatory Visit: Payer: Commercial Managed Care - PPO | Admitting: Neurology

## 2022-04-24 ENCOUNTER — Encounter (HOSPITAL_BASED_OUTPATIENT_CLINIC_OR_DEPARTMENT_OTHER): Payer: Self-pay | Admitting: Obstetrics & Gynecology

## 2022-04-24 DIAGNOSIS — N920 Excessive and frequent menstruation with regular cycle: Secondary | ICD-10-CM | POA: Diagnosis not present

## 2022-04-24 LAB — CBC
HCT: 41.1 % (ref 36.0–46.0)
Hemoglobin: 13.3 g/dL (ref 12.0–15.0)
MCH: 32.3 pg (ref 26.0–34.0)
MCHC: 32.4 g/dL (ref 30.0–36.0)
MCV: 99.8 fL (ref 80.0–100.0)
Platelets: 159 10*3/uL (ref 150–400)
RBC: 4.12 MIL/uL (ref 3.87–5.11)
RDW: 11.6 % (ref 11.5–15.5)
WBC: 8.2 10*3/uL (ref 4.0–10.5)
nRBC: 0 % (ref 0.0–0.2)

## 2022-04-24 MED ORDER — HYDROCODONE-ACETAMINOPHEN 5-325 MG PO TABS: 1.0000 | ORAL_TABLET | Freq: Four times a day (QID) | ORAL | 0 refills | Status: DC | PRN

## 2022-04-24 MED ORDER — KETOROLAC TROMETHAMINE 30 MG/ML IJ SOLN
INTRAMUSCULAR | Status: AC
  Filled 2022-04-24: qty 1

## 2022-04-24 MED ORDER — IBUPROFEN 800 MG PO TABS: 800.0000 mg | ORAL_TABLET | Freq: Three times a day (TID) | ORAL | 0 refills | Status: DC | PRN

## 2022-04-24 MED ORDER — GABAPENTIN 100 MG PO CAPS
ORAL_CAPSULE | ORAL | Status: AC
  Filled 2022-04-24: qty 1

## 2022-04-24 NOTE — Discharge Instructions (Signed)

## 2022-04-24 NOTE — Progress Notes (Signed)
1 Day Post-Op Procedure(s) (LRB): TOTAL LAPAROSCOPIC HYSTERECTOMY BILATERAL SALPINGECTOMY, LEFT OOPHORECTOMY (Bilateral) CYSTOSCOPY (N/A)  Subjective: Patient reports minimal pain.  Denies nausea.  Emptying bladder normally.  No flatus yet    Objective: I have reviewed patient's vital signs, intake and output, medications, and labs. Vitals:   04/24/22 0239 04/24/22 0600  BP: 98/60 111/73  Pulse: 83 84  Resp: 12 14  Temp: 98.3 F (36.8 C) 98.1 F (36.7 C)  SpO2: 93% 94%    General: alert and no distress Resp: clear to auscultation bilaterally Cardio: regular rate and rhythm, S1, S2 normal, no murmur, click, rub or gallop GI: soft, non-tender; bowel sounds normal; no masses,  no organomegaly Extremities: extremities normal, atraumatic, no cyanosis or edema Vaginal Bleeding: none  Assessment: s/p Procedure(s): TOTAL LAPAROSCOPIC HYSTERECTOMY BILATERAL SALPINGECTOMY, LEFT OOPHORECTOMY (Bilateral) CYSTOSCOPY (N/A): stable and progressing well  Plan: Discharge home  LOS: 0 days    Megan Salon, MD 04/24/2022, 7:57 AM

## 2022-04-24 NOTE — Anesthesia Postprocedure Evaluation (Signed)
Anesthesia Post Note  Patient: Valerie [name]  Procedure(s) Performed: TOTAL LAPAROSCOPIC HYSTERECTOMY BILATERAL SALPINGECTOMY, LEFT OOPHORECTOMY (Bilateral: Abdomen) CYSTOSCOPY (Bladder)     Patient location during evaluation: PACU Anesthesia Type: General Level of consciousness: awake and alert Pain management: pain level controlled Vital Signs Assessment: post-procedure vital signs reviewed and stable Respiratory status: spontaneous breathing, nonlabored ventilation, respiratory function stable and patient connected to nasal cannula oxygen Cardiovascular status: blood pressure returned to baseline and stable Postop Assessment: no apparent nausea or vomiting Anesthetic complications: no   No notable events documented.  Last Vitals:  Vitals:   04/24/22 0600 04/24/22 0845  BP: 111/73 114/73  Pulse: 84 86  Resp: 14 16  Temp: 36.7 C 36.7 C  SpO2: 94% 95%    Last Pain:  Vitals:   04/24/22 0845  TempSrc:   PainSc: 0-No pain                 [name]

## 2022-04-25 LAB — SURGICAL PATHOLOGY

## 2022-04-29 ENCOUNTER — Encounter (HOSPITAL_BASED_OUTPATIENT_CLINIC_OR_DEPARTMENT_OTHER): Payer: Self-pay

## 2022-04-29 ENCOUNTER — Ambulatory Visit (HOSPITAL_BASED_OUTPATIENT_CLINIC_OR_DEPARTMENT_OTHER): Admit: 2022-04-29 | Payer: Commercial Managed Care - PPO | Admitting: Obstetrics & Gynecology

## 2022-04-29 SURGERY — DILATATION AND CURETTAGE /HYSTEROSCOPY
Anesthesia: Choice

## 2022-04-30 ENCOUNTER — Ambulatory Visit (HOSPITAL_BASED_OUTPATIENT_CLINIC_OR_DEPARTMENT_OTHER): Payer: Commercial Managed Care - PPO | Admitting: Obstetrics & Gynecology

## 2022-04-30 ENCOUNTER — Encounter (HOSPITAL_BASED_OUTPATIENT_CLINIC_OR_DEPARTMENT_OTHER): Payer: Self-pay | Admitting: Obstetrics & Gynecology

## 2022-04-30 VITALS — BP 118/77 | HR 89 | Ht 65.5 in | Wt 177.0 lb

## 2022-04-30 DIAGNOSIS — Z9889 Other specified postprocedural states: Secondary | ICD-10-CM

## 2022-04-30 NOTE — Progress Notes (Signed)
GYNECOLOGY  VISIT  CC:   post op recheck  HPI: 52 y.o. [id number] [name] here for recheck after undergoing TLH/bilateral salpingectomy/Left ovary removal on 04/23/2022.  She reports bleeding is none.  She has minimal pain.  Bowel function is Normal.  Bladder function is normal.    Pathology reviewed:  Yes .  Questions answered.    MEDS:   Current Outpatient Medications on File Prior to Visit  Medication Sig Dispense Refill   ascorbic acid (VITAMIN C) 1000 MG tablet Take 1 tablet by mouth daily.     ASHWAGANDHA PO Take by mouth.     cholecalciferol (VITAMIN D3) 25 MCG (1000 UNIT) tablet Take 5,000 Units by mouth daily.     furosemide (LASIX) 20 MG tablet Take 1 tablet (20 mg total) by mouth daily. (Patient taking differently: Take 20 mg by mouth as needed.) 90 tablet 2   levothyroxine (SYNTHROID) 88 MCG tablet Take 1 tablet (88 mcg total) by mouth daily before breakfast. 90 tablet 1   MAGNESIUM GLUCONATE PO Take 2 tablets by mouth daily at 12 noon.     omeprazole (PRILOSEC) 20 MG capsule Take 1 capsule (20 mg total) by mouth daily. TAKE 1 CAPSULE BY MOUTH DAILY 90 capsule 3   Probiotic Product (CULTRELLE KIDS IMMUNE DEFENSE PO) Take 1 tablet by mouth daily.     tamoxifen (NOLVADEX) 10 MG tablet Take 1 tablet (10 mg total) by mouth daily. 90 tablet 3   Tocilizumab (ACTEMRA) 162 MG/0.9ML SOSY For MOGAD Takes weekly on Saturday.     UNABLE TO FIND Take 4 tablets by mouth daily. Med Name: Omega Pure 2 tablet in AM and 2 tablet in PM     UNABLE TO FIND Take by mouth daily. Med Name: Ultra Inflamix 2 scoops daily     UNABLE TO FIND Take 2 tablets by mouth daily. Med Name: R-Lipoic Acid     No current facility-administered medications on file prior to visit.    SH:  Smoking No    PHYSICAL EXAMINATION:    BP 118/77   Pulse 89   Ht 5' 5.5" (1.664 m)   Wt 177 lb (80.3 kg)   LMP 12/23/2021 (Exact Date)   BMI 29.01 kg/m     General appearance: alert, cooperative  and appears stated age CV:  Regular rate and rhythm Lungs:  clear to auscultation, no wheezes, rales or rhonchi, symmetric air entry Abdomen: soft, non-tender; bowel sounds normal; no masses,  no organomegaly Incisions:  C/D/I  Pelvic: deferred   Assessment/Plan: 1. Post-operative state - doing well.  Pathology discussed.  Pt may discuss genetic testing with Dr. Lindi Adie. - recheck 3-4 weeks

## 2022-05-06 ENCOUNTER — Encounter: Payer: Self-pay | Admitting: Neurology

## 2022-05-06 ENCOUNTER — Ambulatory Visit (INDEPENDENT_AMBULATORY_CARE_PROVIDER_SITE_OTHER): Payer: Commercial Managed Care - PPO | Admitting: Neurology

## 2022-05-06 VITALS — BP 122/82 | HR 73 | Ht 65.0 in | Wt 178.5 lb

## 2022-05-06 DIAGNOSIS — G3781 Myelin oligodendrocyte glycoprotein antibody disease: Secondary | ICD-10-CM

## 2022-05-06 DIAGNOSIS — Z79899 Other long term (current) drug therapy: Secondary | ICD-10-CM | POA: Diagnosis not present

## 2022-05-06 DIAGNOSIS — H469 Unspecified optic neuritis: Secondary | ICD-10-CM | POA: Diagnosis not present

## 2022-05-06 DIAGNOSIS — C50312 Malignant neoplasm of lower-inner quadrant of left female breast: Secondary | ICD-10-CM

## 2022-05-06 DIAGNOSIS — Z170 Estrogen receptor positive status [ER+]: Secondary | ICD-10-CM

## 2022-05-06 NOTE — Progress Notes (Signed)
[facility]  PATIENT: Valerie [name] DOB: Nov 24, 1970  REFERRING DOCTOR OR PCP:  Dr. Curly Rim SOURCE: Patient, notes from recent hospitalization, laboratory results, MRI images personally reviewed.  _________________________________   HISTORICAL  CHIEF COMPLAINT:  Chief Complaint  Patient presents with   Follow-up    Rm 2, alone. Here for 6 month f/u for MOGAD. Pt reports doing better since being on Actemra.     HISTORY OF PRESENT ILLNESS:  Valerie [name], is a 52 y.o. 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   She gets free drug through [place].  Vision is doing better now.     She saw ophthalmology Curt Jews) in March 2023 and was told VF is back to normal.    She.  Vision improved but is not quite back to baseline.  She had three episodes of ON, the right December 2021 and the left 05/05/2020 and left January 2023.   The left optic neuritis in 2020 was a worse episode with almost complete blindness.  Vision has improved near 20/20.   Colors are mildly desaturated OS and visual resolution seems worse OD.  She has had some itching in the eyes .  She was placed on eye drops prn with benefit.      She has not had any spinal cord syndrome.  Sometimes she drops items but denies any significant weakness  She denies any change in her gait or balance now but had some issues in January.   She notes no difficulty with strength or sensation.  Bladder function is fine.  She notes occasionally brain fog.   She sleeps well.     She had calcifications on the left breast and had a biopsy.   She was diagnosed with left breast cancer summer 2022 - she did a biopsy, followed by surgery and radiation.Marland Kitchen  Lymph nodes were clean at surgery.   She is on Tamoxifen  (will take x 5 years).    Last mammogram was fine.   She has lymphedema.  She had a hysterectomy 04/23/2022 .  She had squamous metaplasia - no actual  cancer.        HISTORY MOGAD / ON She  had right >> left eye pain at the end of December 2021.  On 04/20/2020 she began to have reduced vision OD and photophobia.    She saw an ophthalmologist who felt she had dry eyes.   She saw Dr. Glenis Smoker the next day who diagnosed her with right optic neuritis.  She was referred to Dr. Baird Cancer who did OCT c/with optic neuritis.   She was admittted 04/24/2020 to 04/29/2020 and received 5 days IV Solu-Medrol.   Vision improved with the steroids.   Upon discharge, she still had reduced visual acuity but good color vision.   She had only mild eye aching and mild photophobia.    On 05/05/2020,  she had aching in the left eye followed by complete loss of vision.  Steroids helped.  I initially placed her on azathioprine but she had trouble tolerating it.  We were able to get her on Rituxan.  She had another visual optic neuritis exacerbation early 2023 prompting switch from Rituxan to Actemra.  Around the time she started March 2023 she noted slight visual blurring and eye temporarily increase her prednisone.  She was able to completely come off of prednisone and May 2023.  Imaging studies: MRI of  the orbits, and brain 04/24/2020 showed evidence of right optic neuritis.  The brain was otherwise normal.  MRI of the cervical and thoracic spine 04/24/2020 showed no demyelination.  She does have some mild degenerative changes in the cervical spine.  Data review: Imaging: MRI of the brain 04/24/2020 was normal.  MRI of the orbits 04/24/2020 showed abnormal signal with enhancement in the right optic nerve consistent with optic neuritis.  MRI of the cervical spine and MRI of the thoracic spine 04/24/2020 showed normal spinal cord and some degenerative changes with moderate right foraminal narrowing at C5-C6.  Laboratory data: Labs 04/25/2020:   Anti-MOG antibodies were high titer positive (1: 320).  HIV was negative.   ANA/Sjogrn's antibodies were negative.   RPR was negative,   Anti-NMO Ab was negative,   HgbA1c was normal 5.4.  Sugars ranged 110-183.      REVIEW OF SYSTEMS: Constitutional: No fevers, chills, sweats, or change in appetite Eyes: No visual changes, double vision, eye pain Ear, nose and throat: No hearing loss, ear pain, nasal congestion, sore throat Cardiovascular: No chest pain, palpitations Respiratory:  No shortness of breath at rest or with exertion.   No wheezes GastrointestinaI: No nausea, vomiting, diarrhea, abdominal pain, fecal incontinence Genitourinary:  No dysuria, urinary retention or frequency.  No nocturia. Musculoskeletal:  No neck pain, back pain Integumentary: No rash, pruritus, skin lesions Neurological: as above Psychiatric: No depression at this time.  No anxiety Endocrine: No palpitations, diaphoresis, change in appetite, change in weigh or increased thirst Hematologic/Lymphatic:  No anemia, purpura, petechiae. Allergic/Immunologic: No itchy/runny eyes, nasal congestion, recent allergic reactions, rashes  ALLERGIES: No Known Allergies  HOME MEDICATIONS:  Current Outpatient Medications:    ascorbic acid (VITAMIN C) 1000 MG tablet, Take 1 tablet by mouth daily., Disp: , Rfl:    cholecalciferol (VITAMIN D3) 25 MCG (1000 UNIT) tablet, Take 5,000 Units by mouth daily., Disp: , Rfl:    levothyroxine (SYNTHROID) 75 MCG tablet, Take 1 tablet by mouth daily., Disp: , Rfl:    Magnesium-Potassium (MAG-K/CHELA-MAX PO), Take by mouth., Disp: , Rfl:    omeprazole (PRILOSEC) 20 MG capsule, Take 20 mg by mouth as needed., Disp: , Rfl:    Probiotic Product (CULTRELLE KIDS IMMUNE DEFENSE PO), Take 1 tablet by mouth daily., Disp: , Rfl:    tamoxifen (NOLVADEX) 10 MG tablet, Take 1 tablet by mouth daily., Disp: , Rfl:    Tocilizumab (ACTEMRA) 162 MG/0.9ML SOSY, For MOGAD, Disp: , Rfl:    UNABLE TO FIND, Take 4 tablets by mouth daily. Med Name: Omega Pure 2 tablet in AM and 2 tablet in PM, Disp: , Rfl:    UNABLE TO FIND, Take by mouth  daily. Med Name: Ultra Inflamix 2 scoops daily, Disp: , Rfl:    UNABLE TO FIND, Take 2 tablets by mouth daily. 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)    Optic neuritis    PONV (postoperative nausea and vomiting)     PAST SURGICAL HISTORY: Past Surgical History:  Procedure Laterality Date   BIOPSY BREAST  10/17/2020   BREAST LUMPECTOMY WITH RADIOACTIVE  SEED AND SENTINEL LYMPH NODE BIOPSY Left 11/15/2020   Procedure: LEFT BREAST LUMPECTOMY WITH RADIOACTIVE SEED AND LEFT SENTINEL LYMPH NODE BIOPSY;  Surgeon: Erroll Luna, MD;  Location: [facility];  Service: General;  Laterality: Left;   TUBAL LIGATION  02/2012   WISDOM TOOTH EXTRACTION      FAMILY HISTORY: Family History  Problem Relation Age of Onset   Hearing loss Mother    Thyroid disease Mother    Cancer Mother 59       endometrial cancer, lynch negative   GER disease Mother    Other Mother        uterine fibroids, optic disc abnormality    Eczema Mother    Colon polyps Mother    Hypothyroidism Mother    Osteoarthritis Mother    Hypertension Mother    Migraines Mother    Colon polyps Father    Hearing loss Father    Other Father        Colon adenoma   Hyperlipidemia Father    GER disease Father    Heart murmur Father    Amblyopia Father        Right eye   Neuropathy Father    Glaucoma Maternal Grandmother    Macular degeneration Maternal Grandmother    Multiple sclerosis Paternal Grandmother    Breast cancer Neg Hx    Colon cancer Neg Hx    Esophageal cancer Neg Hx    Rectal cancer Neg Hx    Stomach cancer Neg Hx     SOCIAL  HISTORY:  Social History   Socioeconomic History   Marital status: Married    Spouse name: Not on file   Number of children: 2   Years of education: BS   Highest education level: Not on file  Occupational History   Occupation: Agricultural consultant   Occupation: Shipping and receiving clerk  Tobacco Use   Smoking status: Never   Smokeless tobacco: Never  Vaping Use   Vaping Use: Never used  Substance and Sexual Activity   Alcohol use: Never   Drug use: Never   Sexual activity: Yes    Birth control/protection: Surgical    Comment: BTL  Other Topics Concern   Not on file  Social History Narrative   Right handed    Caffeine use: rare   Social Determinants of Health   Financial Resource Strain: Low Risk  (03/19/2021)   Overall Financial Resource Strain (CARDIA)    Difficulty of Paying Living Expenses: Not hard at all  Food Insecurity: No Food Insecurity (03/19/2021)   Hunger Vital Sign    Worried About Running Out of Food in the Last Year: Never true    Ran Out of Food in the Last Year: Never true  Transportation Needs: No Transportation Needs (03/19/2021)   PRAPARE - Hydrologist (Medical): No    Lack of Transportation (Non-Medical): No  Physical Activity: Inactive (03/19/2021)   Exercise Vital Sign    Days of Exercise per Week: 0 days    Minutes of Exercise per Session: 0 min  Stress: No Stress Concern Present (03/19/2021)   [place]    Feeling of Stress : Not at all  Social Connections: [place] (03/19/2021)   Social Connection and Isolation Panel [NHANES]    Frequency of Communication with Friends and Family: More than three times a week    Frequency of Social Gatherings with Friends and Family:  Three times a week    Attends Religious Services: More than 4 times per year    Active Member of Clubs or Organizations: Yes    Attends Archivist Meetings: 1 to 4  times per year    Marital Status: Married  Human resources officer Violence: Not At Risk (03/19/2021)   Humiliation, Afraid, Rape, and Kick questionnaire    Fear of Current or Ex-Partner: No    Emotionally Abused: No    Physically Abused: No    Sexually Abused: No     PHYSICAL EXAM  Vitals:   10/17/21 1552  BP: 107/74  Pulse: 81  Weight: 179 lb (81.2 kg)  Height: '5\' 6"'$  (1.676 m)    Body mass index is 28.89 kg/m.  VA:   20/20-1 either eye   General: The patient is well-developed and well-nourished and in no acute distress  HEENT:  Head is [place]/AT.  Sclera are anicteric.    Skin: She has lymphedema in the left arm.   Neurologic Exam  Mental status: The patient is alert and oriented x 3 at the time of the examination. The patient has apparent normal recent and remote memory, with an apparently normal attention span and concentration ability.   Speech is normal.  Cranial nerves: Extraocular movements are full.  Visual acuity is 20/20 out of either eye.  She can move her eyes without pain.  She has a 1+ left APD.  Color vision is desaturated slightly OD.  Facial strength and sensation was normal.  Hearing was symmetric.  Motor:  Muscle bulk is normal.   Tone is normal. Strength is  5 / 5 in all 4 extremities.   Sensory: Sensory testing is intact to pinprick, soft touch and vibration sensation in all 4 extremities.  Coordination: Cerebellar testing reveals good finger-nose-finger and heel-to-shin bilaterally.  Gait and station: Station is normal.   Gait is normal Tandem gait is mildly wide.  Romberg negative.  Reflexes: Deep tendon reflexes are symmetric and normal bilaterally.     DIAGNOSTIC DATA (LABS, IMAGING, TESTING) - I reviewed patient records, labs, notes, testing and imaging myself where available.  Lab Results  Component Value Date   WBC 7.1 06/11/2021   HGB 14.4 06/11/2021   HCT 44.5 06/11/2021   MCV 97 06/11/2021   PLT 221 06/11/2021      Component Value  Date/Time   NA 139 06/11/2021 0806   K 4.9 06/11/2021 0806   CL 105 06/11/2021 0806   CO2 22 06/11/2021 0806   GLUCOSE 91 06/11/2021 0806   GLUCOSE 87 10/24/2020 0840   BUN 17 06/11/2021 0806   CREATININE 0.98 06/11/2021 0806   CREATININE 0.97 10/24/2020 0840   CALCIUM 8.7 06/11/2021 0806   PROT 6.2 06/11/2021 0806   ALBUMIN 4.0 06/11/2021 0806   AST 16 06/11/2021 0806   AST 17 10/24/2020 0840   ALT 26 06/11/2021 0806   ALT 16 10/24/2020 0840   ALKPHOS 36 (L) 06/11/2021 0806   BILITOT <0.2 06/11/2021 0806   BILITOT 0.3 10/24/2020 0840   GFRNONAA >60 10/24/2020 0840   Lab Results  Component Value Date   CHOL 172 06/11/2021   HDL 51 06/11/2021   LDLCALC 100 (H) 06/11/2021   TRIG 119 06/11/2021   CHOLHDL 3.4 06/11/2021   Lab Results  Component Value Date   HGBA1C 5.4 04/25/2020       ASSESSMENT AND PLAN  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   Continue Actemra.  She will come in fasting next visit and we will check CBC with differential, CMP and lipid panel next week (will have to come in the morning for lipid panel)  2.   Stay active.  Vitamins and supplements 3.   Follow-up with Dr. Lindi Adie for breast cancer.   4.   rtc 6 months    Rendon Howell A. Felecia Shelling, MD, [facility] Shave 12/15/9242, 6:28 PM Certified in Neurology, Clinical Neurophysiology, Sleep Medicine and Neuroimaging  [facility] [address] [place], [place] [address] [phone]

## 2022-05-07 ENCOUNTER — Encounter (HOSPITAL_BASED_OUTPATIENT_CLINIC_OR_DEPARTMENT_OTHER): Payer: Self-pay | Admitting: *Deleted

## 2022-05-12 NOTE — Assessment & Plan Note (Signed)
10/23/2020 screening mammogram on 09/11/20 showed possible distortion with calcifications in the left breast. Diagnostic mammogram and [place] on 10/03/20 showed 0.6 cm group of indeterminate lower inner left breast calcifications with possible associated subtle distortion and no abnormal appearing left axillary lymph nodes. Biopsy on 10/17/20 showed invasive ductal carcinoma, DCIS with calcifications, ER+(90%)/PR(95%)/Ki67(<5%).   11/15/2020:Left lumpectomy: No residual cancer, fibrocystic change, margins negative, 0/3 lymph nodes negative (based on the biopsy tumor size: 2 mm), previous ER 90%, PR 95%, HER2 negative, Ki-67 less than 5%   Adjuvant radiation: 12/25/2020-01/18/2021   Treatment plan: Adjuvant antiestrogen therapy with tamoxifen 10 mg daily x5 yrs Tamoxifen toxicities: Elbow pain: Not responding to stopping Tam, oral steroids havent helped her.Advised her to use CBD oil and Voltaren gel Hot flushes   She will review the tamoxifen to 10 mg 4 days a week.  Breast cancer surveillance: mammogram 04/03/2022: benign Density cat C Assymetric redness around the Right Nipple areolar complex: Not very symptomatic hence we will watch and monitor since the mammogram and ultrasound were negative. Breast Exam: 05/13/22: benign, redness in the right breast nipple areolar complex    Recent hysterectomy and unilateral oophorectomy.  I would like to obtain [facility] and estradiol levels.  She is interested in genetic testing and we will make a referral for that.  Return to clinic in 1 year for follow-up

## 2022-05-12 NOTE — Progress Notes (Signed)
Patient Care Team: Tawnya Crook, MD as PCP - General (Family Medicine) Buford Dresser, MD as PCP - Cardiology (Cardiology) Erroll Luna, MD as Consulting Physician (General Surgery) Nicholas Lose, MD as Consulting Physician (Hematology and Oncology) Kyung Rudd, MD as Consulting Physician (Radiation Oncology) Felecia Shelling, Nanine Means, MD (Neurology)  DIAGNOSIS: No diagnosis found.  SUMMARY OF ONCOLOGIC HISTORY: Oncology History  Malignant neoplasm of lower-inner quadrant of left breast in female, estrogen receptor positive ([place])  10/23/2020 Initial Diagnosis   Screening mammogram on 09/11/20 showed possible distortion with calcifications in the left breast. Diagnostic mammogram and [place] on 10/03/20 showed 0.6 cm group of indeterminate lower inner left breast calcifications with possible associated subtle distortion and no abnormal appearing left axillary lymph nodes. Biopsy on 10/17/20 showed invasive ductal carcinoma, DCIS with calcifications, ER+(90%)/PR(95%)/Ki67(<5%).   10/24/2020 Cancer Staging   Staging form: Breast, AJCC 8th Edition - Clinical stage from 10/24/2020: Stage IA (cT1b, cN0, cM0, G1, ER+, PR+, HER2-) - Signed by Nicholas Lose, MD on 10/24/2020 Stage prefix: Initial diagnosis Histologic grading system: 3 grade system Laterality: Left Staged by: Pathologist and managing physician Stage used in treatment planning: Yes National guidelines used in treatment planning: Yes Type of national guideline used in treatment planning: NCCN   11/15/2020 Surgery   Left lumpectomy: No residual cancer, fibrocystic change, margins negative, 0/3 lymph nodes negative (based on the biopsy tumor size: 2 mm), previous ER 90%, PR 95%, HER2 negative, Ki-67 less than 5%   12/25/2020 - 01/18/2021 Radiation Therapy   Adjuvant radiation   02/21/2021 -  Anti-estrogen oral therapy   Tamoxifen     CHIEF COMPLIANT:   INTERVAL HISTORY: Brynley Cuddeback is a   ALLERGIES:  has No Known  Allergies.  MEDICATIONS:  Current Outpatient Medications  Medication Sig Dispense Refill   ascorbic acid (VITAMIN C) 1000 MG tablet Take 1 tablet by mouth daily.     ASHWAGANDHA PO Take by mouth.     cholecalciferol (VITAMIN D3) 25 MCG (1000 UNIT) tablet Take 5,000 Units by mouth daily.     furosemide (LASIX) 20 MG tablet Take 1 tablet (20 mg total) by mouth daily. (Patient taking differently: Take 20 mg by mouth as needed.) 90 tablet 2   levothyroxine (SYNTHROID) 88 MCG tablet Take 1 tablet (88 mcg total) by mouth daily before breakfast. 90 tablet 1   MAGNESIUM GLUCONATE PO Take 2 tablets by mouth daily at 12 noon.     omeprazole (PRILOSEC) 20 MG capsule Take 1 capsule (20 mg total) by mouth daily. TAKE 1 CAPSULE BY MOUTH DAILY 90 capsule 3   Probiotic Product (CULTRELLE KIDS IMMUNE DEFENSE PO) Take 1 tablet by mouth daily.     tamoxifen (NOLVADEX) 10 MG tablet Take 1 tablet (10 mg total) by mouth daily. 90 tablet 3   Tocilizumab (ACTEMRA) 162 MG/0.9ML SOSY For MOGAD Takes weekly on Saturday.     UNABLE TO FIND Take 4 tablets by mouth daily. Med Name: Omega Pure 2 tablet in AM and 1 tablet in PM     UNABLE TO FIND Take by mouth daily. Med Name: Ultra Inflamix 2 scoops daily     UNABLE TO FIND Take 2 tablets by mouth daily. Med Name: R-Lipoic Acid     No current facility-administered medications for this visit.    PHYSICAL EXAMINATION: ECOG PERFORMANCE STATUS: {CHL ONC ECOG PS:[phone]}  There were no vitals filed for this visit. There were no vitals filed for this visit.  BREAST:*** No palpable masses or  nodules in either right or left breasts. No palpable axillary supraclavicular or infraclavicular adenopathy no breast tenderness or nipple discharge. (exam performed in the presence of a chaperone)  LABORATORY DATA:  I have reviewed the data as listed    Latest Ref Rng & Units 04/17/2022    8:17 AM 02/25/2022   11:31 AM 11/08/2021   10:16 AM  CMP  Glucose 70 - 99 mg/dL 99  89   93   BUN 6 - 20 mg/dL '19  19  16   '$ Creatinine 0.44 - 1.00 mg/dL 0.87  0.86  0.88   Sodium 135 - 145 mmol/L 139  139  141   Potassium 3.5 - 5.1 mmol/L 4.7  4.7  4.3   Chloride 98 - 111 mmol/L 107  105  109   CO2 22 - 32 mmol/L '24  29  30   '$ Calcium 8.9 - 10.3 mg/dL 8.4  8.7  8.8   Total Protein 6.0 - 8.3 g/dL  6.0  5.9   Total Bilirubin 0.2 - 1.2 mg/dL  0.4  0.4   Alkaline Phos 39 - 117 U/L  30  26   AST 0 - 37 U/L  20  20   ALT 0 - 35 U/L  27  26     Lab Results  Component Value Date   WBC 8.2 04/24/2022   HGB 13.3 04/24/2022   HCT 41.1 04/24/2022   MCV 99.8 04/24/2022   PLT 159 04/24/2022   NEUTROABS 3.9 02/25/2022    ASSESSMENT & PLAN:  No problem-specific Assessment & Plan notes found for this encounter.    No orders of the defined types were placed in this encounter.  The patient has a good understanding of the overall plan. she agrees with it. she will call with any problems that may develop before the next visit here. Total time spent: 30 mins including face to face time and time spent for planning, charting and co-ordination of care   Suzzette Righter, [place] 05/12/22    I Gardiner Coins am acting as a Education administrator for [employer]  ***

## 2022-05-13 ENCOUNTER — Inpatient Hospital Stay: Payer: Commercial Managed Care - PPO | Attending: Hematology and Oncology | Admitting: Hematology and Oncology

## 2022-05-13 VITALS — BP 126/87 | HR 81 | Temp 97.6°F | Resp 17 | Wt 177.1 lb

## 2022-05-13 DIAGNOSIS — Z170 Estrogen receptor positive status [ER+]: Secondary | ICD-10-CM | POA: Diagnosis not present

## 2022-05-13 DIAGNOSIS — C50312 Malignant neoplasm of lower-inner quadrant of left female breast: Secondary | ICD-10-CM | POA: Insufficient documentation

## 2022-05-13 DIAGNOSIS — Z79810 Long term (current) use of selective estrogen receptor modulators (SERMs): Secondary | ICD-10-CM | POA: Diagnosis not present

## 2022-05-13 DIAGNOSIS — Z79899 Other long term (current) drug therapy: Secondary | ICD-10-CM | POA: Diagnosis not present

## 2022-05-15 ENCOUNTER — Encounter (HOSPITAL_BASED_OUTPATIENT_CLINIC_OR_DEPARTMENT_OTHER): Payer: Self-pay

## 2022-05-15 ENCOUNTER — Encounter: Payer: Self-pay | Admitting: Internal Medicine

## 2022-05-16 ENCOUNTER — Encounter: Payer: Self-pay | Admitting: Neurology

## 2022-05-19 ENCOUNTER — Other Ambulatory Visit: Payer: Self-pay | Admitting: Neurology

## 2022-05-19 DIAGNOSIS — E559 Vitamin D deficiency, unspecified: Secondary | ICD-10-CM

## 2022-05-19 DIAGNOSIS — G3781 Myelin oligodendrocyte glycoprotein antibody disease: Secondary | ICD-10-CM

## 2022-05-19 DIAGNOSIS — Z79899 Other long term (current) drug therapy: Secondary | ICD-10-CM

## 2022-05-22 ENCOUNTER — Telehealth: Payer: Self-pay

## 2022-05-23 ENCOUNTER — Encounter (HOSPITAL_BASED_OUTPATIENT_CLINIC_OR_DEPARTMENT_OTHER): Payer: Self-pay | Admitting: Obstetrics & Gynecology

## 2022-05-23 ENCOUNTER — Ambulatory Visit (HOSPITAL_BASED_OUTPATIENT_CLINIC_OR_DEPARTMENT_OTHER): Payer: Commercial Managed Care - PPO | Admitting: Obstetrics & Gynecology

## 2022-05-23 VITALS — BP 122/75 | HR 76 | Ht 65.5 in | Wt 180.6 lb

## 2022-05-23 DIAGNOSIS — Z9889 Other specified postprocedural states: Secondary | ICD-10-CM

## 2022-05-23 NOTE — Progress Notes (Unsigned)
GYNECOLOGY  VISIT  CC:   post op recheck  HPI: 52 y.o. G3P0012 [name] here for recheck after undergoing TLH/bilateral salpingectomy/cystoscopy on 1/10/204.  She reports minimal spotting.  She has no pain.  Bowel function is Normal.  Bladder function is normal.    Pathology reviewed:  Yes .  Questions answered.    MEDS:   Current Outpatient Medications on File Prior to Visit  Medication Sig Dispense Refill   ascorbic acid (VITAMIN C) 1000 MG tablet Take 1 tablet by mouth daily.     ASHWAGANDHA PO Take by mouth.     cholecalciferol (VITAMIN D3) 25 MCG (1000 UNIT) tablet Take 5,000 Units by mouth daily.     furosemide (LASIX) 20 MG tablet Take 1 tablet (20 mg total) by mouth daily. (Patient taking differently: Take 20 mg by mouth as needed.) 90 tablet 2   levothyroxine (SYNTHROID) 88 MCG tablet Take 1 tablet (88 mcg total) by mouth daily before breakfast. 90 tablet 1   MAGNESIUM GLUCONATE PO Take 2 tablets by mouth daily at 12 noon.     omeprazole (PRILOSEC) 20 MG capsule Take 1 capsule (20 mg total) by mouth daily. TAKE 1 CAPSULE BY MOUTH DAILY 90 capsule 3   Probiotic Product (CULTRELLE KIDS IMMUNE DEFENSE PO) Take 1 tablet by mouth daily.     tamoxifen (NOLVADEX) 10 MG tablet Take 1 tablet (10 mg total) by mouth daily. 90 tablet 3   Tocilizumab (ACTEMRA) 162 MG/0.9ML SOSY For MOGAD Takes weekly on Saturday.     UNABLE TO FIND Take 4 tablets by mouth daily. Med Name: Omega Pure 2 tablet in AM and 1 tablet in PM     UNABLE TO FIND Take by mouth daily. Med Name: Ultra Inflamix 2 scoops daily     UNABLE TO FIND Take 2 tablets by mouth daily. Med Name: R-Lipoic Acid     No current facility-administered medications on file prior to visit.    SH:  Smoking No    PHYSICAL EXAMINATION:    BP 122/75 (BP Location: Right Arm, Patient Position: Sitting, Cuff Size: Large)   Pulse 76   Ht 5' 5.5" (1.664 m) Comment: Reported  Wt 180 lb 9.6 oz (81.9 kg)   LMP  12/23/2021 (Exact Date)   BMI 29.60 kg/m     General appearance: alert, cooperative and appears stated age CV:  Regular rate and rhythm Lungs:  clear to auscultation, no wheezes, rales or rhonchi, symmetric air entry Abdomen: soft, non-tender; bowel sounds normal; no masses,  no organomegaly Incisions:  C/D/I  Pelvic: External genitalia:  no lesions              Urethra:  normal appearing urethra with no masses, tenderness or lesions              Bartholins and Skenes: normal                 Vagina: normal appearing vagina with normal color and discharge, no lesions              Cervix: absent              Bimanual Exam:  Uterus:  uterus absent              Adnexa: no mass, fullness, tenderness   Assessment/Plan: 1. Post-operative state - pt will recheck in 4 weeks due to small amount of spotting - she is going to working towards full time work.  Lifting limitations discussed.  She reports this is not difficult to accommodate.

## 2022-06-02 ENCOUNTER — Encounter: Payer: Self-pay | Admitting: *Deleted

## 2022-06-02 ENCOUNTER — Other Ambulatory Visit: Payer: Self-pay | Admitting: *Deleted

## 2022-06-02 DIAGNOSIS — G3781 Myelin oligodendrocyte glycoprotein antibody disease: Secondary | ICD-10-CM

## 2022-06-02 MED ORDER — ACTEMRA 162 MG/0.9ML ~~LOC~~ SOSY: 0.9000 mL | PREFILLED_SYRINGE | SUBCUTANEOUS | 3 refills | Status: DC

## 2022-06-02 NOTE — Telephone Encounter (Signed)
Faxed rx Actemra refill to [facility] at [phone]. Received fax confirmation.

## 2022-06-03 ENCOUNTER — Encounter: Payer: Self-pay | Admitting: Rehabilitation

## 2022-06-03 ENCOUNTER — Encounter: Payer: Self-pay | Admitting: Family Medicine

## 2022-06-06 ENCOUNTER — Other Ambulatory Visit: Payer: Self-pay | Admitting: Internal Medicine

## 2022-06-06 DIAGNOSIS — E038 Other specified hypothyroidism: Secondary | ICD-10-CM

## 2022-06-09 ENCOUNTER — Other Ambulatory Visit (INDEPENDENT_AMBULATORY_CARE_PROVIDER_SITE_OTHER): Payer: Commercial Managed Care - PPO

## 2022-06-09 ENCOUNTER — Encounter: Payer: Self-pay | Admitting: Internal Medicine

## 2022-06-09 DIAGNOSIS — E063 Autoimmune thyroiditis: Secondary | ICD-10-CM

## 2022-06-09 DIAGNOSIS — E038 Other specified hypothyroidism: Secondary | ICD-10-CM | POA: Diagnosis not present

## 2022-06-09 LAB — T4, FREE: Free T4: 1.16 ng/dL (ref 0.60–1.60)

## 2022-06-09 LAB — TSH: TSH: 0.15 u[IU]/mL — ABNORMAL LOW (ref 0.35–5.50)

## 2022-06-10 ENCOUNTER — Other Ambulatory Visit: Payer: Self-pay | Admitting: Internal Medicine

## 2022-06-10 DIAGNOSIS — E038 Other specified hypothyroidism: Secondary | ICD-10-CM

## 2022-06-17 ENCOUNTER — Ambulatory Visit (INDEPENDENT_AMBULATORY_CARE_PROVIDER_SITE_OTHER): Payer: Commercial Managed Care - PPO | Admitting: Obstetrics & Gynecology

## 2022-06-17 ENCOUNTER — Encounter (HOSPITAL_BASED_OUTPATIENT_CLINIC_OR_DEPARTMENT_OTHER): Payer: Self-pay | Admitting: Obstetrics & Gynecology

## 2022-06-17 VITALS — BP 122/71 | HR 74 | Ht 65.5 in | Wt 178.4 lb

## 2022-06-17 DIAGNOSIS — Z90710 Acquired absence of both cervix and uterus: Secondary | ICD-10-CM

## 2022-06-17 NOTE — Progress Notes (Signed)
GYNECOLOGY  VISIT  CC:   post op recheck  HPI: 52 y.o. G3P0012 [name] here for recheck after undergoing TLH/BSO/cystoscopy on 04/23/2022.  She had some bleeding around 4 weeks post op.  Advised four more weeks of pelvic rest.  Bowel and bladder function is normal.  Denies pelvic pain.     MEDS:   Current Outpatient Medications on File Prior to Visit  Medication Sig Dispense Refill   ascorbic acid (VITAMIN C) 1000 MG tablet Take 1 tablet by mouth daily.     ASHWAGANDHA PO Take by mouth.     cholecalciferol (VITAMIN D3) 25 MCG (1000 UNIT) tablet Take 5,000 Units by mouth daily.     furosemide (LASIX) 20 MG tablet Take 1 tablet (20 mg total) by mouth daily. (Patient taking differently: Take 20 mg by mouth as needed.) 90 tablet 2   levothyroxine (SYNTHROID) 88 MCG tablet Take 1 tablet (88 mcg total) by mouth daily before breakfast. 90 tablet 1   MAGNESIUM GLUCONATE PO Take 2 tablets by mouth daily at 12 noon.     omeprazole (PRILOSEC) 20 MG capsule Take 1 capsule (20 mg total) by mouth daily. TAKE 1 CAPSULE BY MOUTH DAILY 90 capsule 3   Probiotic Product (CULTRELLE KIDS IMMUNE DEFENSE PO) Take 1 tablet by mouth daily.     tamoxifen (NOLVADEX) 10 MG tablet Take 1 tablet (10 mg total) by mouth daily. 90 tablet 3   Tocilizumab (ACTEMRA) 162 MG/0.9ML SOSY Inject 0.9 mLs (162 mg total) into the skin once a week. For MOGAD Takes weekly on Saturday. 3.9 mL 3   UNABLE TO FIND Take 4 tablets by mouth daily. Med Name: Omega Pure 2 tablet in AM and 1 tablet in PM     UNABLE TO FIND Take by mouth daily. Med Name: Ultra Inflamix 2 scoops daily     UNABLE TO FIND Take 2 tablets by mouth daily. Med Name: R-Lipoic Acid     No current facility-administered medications on file prior to visit.    SH:  Smoking No    PHYSICAL EXAMINATION:    BP 122/71   Pulse 74   Ht 5' 5.5" (1.664 m)   Wt 178 lb 6.4 oz (80.9 kg)   LMP 12/23/2021 (Exact Date)   BMI 29.24 kg/m     General  appearance: alert, cooperative and appears stated age  Pelvic: External genitalia:  no lesions              Urethra:  normal appearing urethra with no masses, tenderness or lesions              Bartholins and Skenes: normal                 Vagina: normal appearing vagina with normal color and discharge, no lesions, well healed, no sutures present              Cervix: absent   Assessment/Plan: 1. H/O: hysterectomy - doing well.  No bleeding.  Pelvic rest for another 4 weeks reviewed.  Pap smears not indicated.  Follow up 1 year if pt desires.  Ok to not have this appt if doing well.  Reasons for being seen regarding remaining right ovary discussed.

## 2022-06-18 ENCOUNTER — Encounter: Payer: Self-pay | Admitting: Neurology

## 2022-06-18 ENCOUNTER — Other Ambulatory Visit: Payer: Self-pay | Admitting: *Deleted

## 2022-06-18 DIAGNOSIS — G3781 Myelin oligodendrocyte glycoprotein antibody disease: Secondary | ICD-10-CM

## 2022-06-18 MED ORDER — ACTEMRA 162 MG/0.9ML ~~LOC~~ SOSY: 0.9000 mL | PREFILLED_SYRINGE | SUBCUTANEOUS | 3 refills | Status: DC

## 2022-06-19 ENCOUNTER — Ambulatory Visit (HOSPITAL_BASED_OUTPATIENT_CLINIC_OR_DEPARTMENT_OTHER): Payer: Commercial Managed Care - PPO | Admitting: Obstetrics & Gynecology

## 2022-06-24 ENCOUNTER — Other Ambulatory Visit: Payer: Self-pay | Admitting: Genetic Counselor

## 2022-06-24 ENCOUNTER — Inpatient Hospital Stay: Payer: Commercial Managed Care - PPO

## 2022-06-24 ENCOUNTER — Inpatient Hospital Stay: Payer: Commercial Managed Care - PPO | Attending: Hematology and Oncology | Admitting: Genetic Counselor

## 2022-06-24 ENCOUNTER — Other Ambulatory Visit: Payer: Self-pay

## 2022-06-24 ENCOUNTER — Encounter: Payer: Self-pay | Admitting: Genetic Counselor

## 2022-06-24 ENCOUNTER — Encounter: Payer: Self-pay | Admitting: Hematology and Oncology

## 2022-06-24 DIAGNOSIS — Z1379 Encounter for other screening for genetic and chromosomal anomalies: Secondary | ICD-10-CM

## 2022-06-24 DIAGNOSIS — Z8049 Family history of malignant neoplasm of other genital organs: Secondary | ICD-10-CM

## 2022-06-24 DIAGNOSIS — Z170 Estrogen receptor positive status [ER+]: Secondary | ICD-10-CM

## 2022-06-24 DIAGNOSIS — C50312 Malignant neoplasm of lower-inner quadrant of left female breast: Secondary | ICD-10-CM | POA: Diagnosis not present

## 2022-06-24 LAB — GENETIC SCREENING ORDER

## 2022-06-24 NOTE — Progress Notes (Signed)
REFERRING PROVIDER: Nicholas Lose, MD  PRIMARY PROVIDER:  Tawnya Crook, MD  PRIMARY REASON FOR VISIT:  Encounter Diagnoses  Name Primary?   Malignant neoplasm of lower-inner quadrant of left breast in female, estrogen receptor positive ([place]) Yes   Family history of uterine cancer    HISTORY OF PRESENT ILLNESS:   Valerie [name], a 52 y.o. female, was seen for a [facility] cancer genetics consultation at the request of Dr. Lindi Adie due to a personal and family history of breast cancer.  Valerie [name] presents to clinic today to discuss the possibility of a hereditary predisposition to cancer, to discuss genetic testing, and to further clarify her future cancer risks, as well as potential cancer risks for family members.   In 2022, at the age of 70, Valerie [name] was diagnosed with invasive ductal carcinoma of the left breast (ER/PR positive, HER2 negative).    CANCER HISTORY:  Oncology History  Malignant neoplasm of lower-inner quadrant of left breast in female, estrogen receptor positive ([place])  10/23/2020 Initial Diagnosis   Screening mammogram on 09/11/20 showed possible distortion with calcifications in the left breast. Diagnostic mammogram and [place] on 10/03/20 showed 0.6 cm group of indeterminate lower inner left breast calcifications with possible associated subtle distortion and no abnormal appearing left axillary lymph nodes. Biopsy on 10/17/20 showed invasive ductal carcinoma, DCIS with calcifications, ER+(90%)/PR(95%)/Ki67(<5%).   10/24/2020 Cancer Staging   Staging form: Breast, AJCC 8th Edition - Clinical stage from 10/24/2020: Stage IA (cT1b, cN0, cM0, G1, ER+, PR+, HER2-) - Signed by Nicholas Lose, MD on 10/24/2020 Stage prefix: Initial diagnosis Histologic grading system: 3 grade system Laterality: Left Staged by: Pathologist and managing physician Stage used in treatment planning: Yes National guidelines used in treatment planning: Yes Type of national guideline used in treatment  planning: NCCN   11/15/2020 Surgery   Left lumpectomy: No residual cancer, fibrocystic change, margins negative, 0/3 lymph nodes negative (based on the biopsy tumor size: 2 mm), previous ER 90%, PR 95%, HER2 negative, Ki-67 less than 5%   12/25/2020 - 01/18/2021 Radiation Therapy   Adjuvant radiation   02/21/2021 -  Anti-estrogen oral therapy   Tamoxifen      RISK FACTORS:  First live birth at age 67.  Ovaries intact: right ovary intact, left ovary removed.  Uterus intact: no.  Colonoscopy: yes; 28m sessile polyp removed from cecum. Mammogram within the last year: yes.   Past Medical History:  Diagnosis Date   Abnormal echocardiogram 11/28/2021   EF 60 -65%, abnormal global longitudinal strain, see cardiology OV in Epic dated 12/25/21   [place]   left breast, ER+, s/p radiation therapy 12/25/20 - 01/18/21, Patient [place] [place] [facility]   COVID-19 11/2019   flu-like symptoms, cough, no hospitalizations   Edema    lower extremity, follows [facility]/ [facility] [place] [place] See OV dated 03/24/22 in Epic.   GERD (gastroesophageal reflux disease)    Follows w/ Dr. YDaleen Bo gastroenterologist and PCP, [facility]   Hashimoto's disease    Follows with Dr. CBenjiman Core@ [place].   History of hiatal hernia    2 cm hiatal hernia per 01/08/22 EGD   History of radiation therapy 2022   completed 01-18-2021   Hypothyroidism    Follows w/ endocronologist, Dr. CBenjiman Core@ [place].   Lymphedema    left arm, pt uses lymphedema pump at night   Mitral regurgitation  mild by echo 06/2020   Myelin oligodendrocyte glycoprotein antibody disorder (MOGAD)    Follows with Dr. Arlice Colt @ [facility].   Optic neuritis    Follows w/ opthamology, Dr. Curt Jews.03/2020 right eye (almost complete blindness), 04/2020 left eye   PONV (postoperative nausea and vomiting)    Status post dilation of  esophageal narrowing 01/08/2022   EGD, esophagus dilated   Wears glasses     Past Surgical History:  Procedure Laterality Date   BIOPSY BREAST Left 10/17/2020   BREAST LUMPECTOMY WITH RADIOACTIVE SEED AND SENTINEL LYMPH NODE BIOPSY Left 11/15/2020   Procedure: LEFT BREAST LUMPECTOMY WITH RADIOACTIVE SEED AND LEFT SENTINEL LYMPH NODE BIOPSY;  Surgeon: Erroll Luna, MD;  Location: [facility];  Service: General;  Laterality: Left;   COLONOSCOPY  09/06/2021   cecal polyp   CT CORONARY CA SCORING  07/13/2020   Coronary Calcium = 0 (in Epic)   CYSTOSCOPY N/A 04/23/2022   Procedure: CYSTOSCOPY;  Surgeon: Megan Salon, MD;  Location[facility];  Service: Gynecology;  Laterality: N/A;   ESOPHAGOGASTRODUODENOSCOPY  01/08/2022   2cm hiatal hernia, esophagus dilated   TOTAL LAPAROSCOPIC HYSTERECTOMY WITH SALPINGECTOMY Bilateral 04/23/2022   Procedure: TOTAL LAPAROSCOPIC HYSTERECTOMY BILATERAL SALPINGECTOMY, LEFT OOPHORECTOMY;  Surgeon: Megan Salon, MD;  Location: [facility];  Service: Gynecology;  Laterality: Bilateral;   TRANSTHORACIC ECHOCARDIOGRAM  11/28/2021   see results in [place] ECHOCARDIOGRAM  07/05/2020   in [place]  02/2012   WISDOM TOOTH EXTRACTION     early 1990's    Social History   Socioeconomic History   Marital status: Married    Spouse name: Not on file   Number of children: 2   Years of education: BS   Highest education level: Not on file  Occupational History   Occupation: Agricultural consultant   Occupation: Shipping and receiving clerk  Tobacco Use   Smoking status: Never   Smokeless tobacco: Not on file  Vaping Use   Vaping Use: Never used  Substance and Sexual Activity   Alcohol use: Not Currently   Drug use: Never   Sexual activity: Not Currently    Birth control/protection: Surgical    Comment: hysterectomy  Other Topics Concern   Not on file  Social History Narrative   Right  handed    Caffeine use: rare   Social Determinants of Health   Financial Resource Strain: Low Risk  (03/19/2021)   Overall Financial Resource Strain (CARDIA)    Difficulty of Paying Living Expenses: Not hard at all  Food Insecurity: No Food Insecurity (03/19/2021)   Hunger Vital Sign    Worried About Running Out of Food in the Last Year: Never true    [place] in the Last Year: Never true  Transportation Needs: No Transportation Needs (03/19/2021)   PRAPARE - Hydrologist (Medical): No    Lack of Transportation (Non-Medical): No  Physical Activity: Inactive (03/19/2021)   Exercise Vital Sign    Days of Exercise per Week: 0 days    Minutes of Exercise per Session: 0 min  Stress: No Stress Concern Present (03/19/2021)   [place]    Feeling of Stress : Not at all  Social Connections: [place] (03/19/2021)   Social Connection and Isolation Panel [NHANES]    Frequency of Communication with Friends and Family: More than three times  a week    Frequency of Social Gatherings with Friends and Family: Three times a week    Attends Religious Services: More than 4 times per year    Active Member of Clubs or Organizations: Yes    Attends Archivist Meetings: 1 to 4 times per year    Marital Status: Married     FAMILY HISTORY:  We obtained a detailed, 4-generation family history.  Significant diagnoses are listed below:  Family History  Problem Relation Age of Onset   Hearing loss Mother    Thyroid disease Mother    Cancer Mother 3       endometrial cancer, lynch negative   GER disease Mother    Other Mother        uterine fibroids, optic disc abnormality    Eczema Mother    Colon polyps Mother    Hypothyroidism Mother    Osteoarthritis Mother    Hypertension Mother    Migraines Mother    Colon polyps Father    Hearing loss Father    Other Father        Colon  adenoma   Hyperlipidemia Father    GER disease Father    Heart murmur Father    Amblyopia Father        Right eye   Neuropathy Father    Glaucoma Maternal Grandmother    Macular degeneration Maternal Grandmother    Multiple sclerosis Paternal Grandmother    Breast cancer Neg Hx    Colon cancer Neg Hx    Esophageal cancer Neg Hx    Rectal cancer Neg Hx    Stomach cancer Neg Hx       Valerie [name] mother was diagnosed with uterine cancer at age 35. Pathology showed normal IHC. Valerie [name] reports her mother had negative genetic testing for Lynch Syndrome but we do not have the report to confirm. Patient's maternal ancestors are of [place] and [place] descent, and paternal ancestors are of English descent. There is no reported Ashkenazi Jewish ancestry and no known consanguinity.  GENETIC COUNSELING ASSESSMENT: Valerie [name] is a 52 y.o. female with a personal history of breast cancer and a family history of uterine cancer which is somewhat suggestive of a hereditary predisposition to cancer. We, therefore, discussed and recommended the following at today's visit.   DISCUSSION: We discussed that 5 - 10% of cancer is hereditary, with most cases of breast cancer associated with BRCA1 and BRCA2.  There are other genes that can be associated with hereditary cancer syndromes.  We discussed that testing is beneficial for several reasons including knowing how to follow individuals after completing their treatment, identifying whether potential treatment options would be beneficial, and understanding if other family members could be at risk for cancer and allowing them to undergo genetic testing.   We reviewed the characteristics, features and inheritance patterns of hereditary cancer syndromes. We also discussed genetic testing, including the appropriate family members to test, the process of testing, insurance coverage and turn-around-time for results. We discussed the implications of a negative, positive,  carrier and/or variant of uncertain significant result. We recommended Valerie [name] pursue genetic testing for a panel that includes genes associated with breast and uterine cancer.   Valerie [name] elected to have [facility] Multi-Cancer Panel. The Multi-Cancer + RNA Panel offered by [facility] includes sequencing and/or deletion/duplication analysis of the following 70 genes:  AIP*, ALK, APC*, ATM*, AXIN2*, BAP1*, BARD1*, BLM*, BMPR1A*, BRCA1*, BRCA2*, BRIP1*, CDC73*, CDH1*, CDK4, CDKN1B*, CDKN2A, CHEK2*,  CTNNA1*, DICER1*, EPCAM (del/dup only), EGFR, FH*, FLCN*, GREM1 (promoter dup only), HOXB13, KIT, LZTR1, MAX*, MBD4, MEN1*, MET, MITF, MLH1*, MSH2*, MSH3*, MSH6*, MUTYH*, NF1*, NF2*, NTHL1*, PALB2*, PDGFRA, PMS2*, POLD1*, POLE*, POT1*, PRKAR1A*, PTCH1*, PTEN*, RAD51C*, RAD51D*, RB1*, RET, SDHA* (sequencing only), SDHAF2*, SDHB*, SDHC*, SDHD*, SMAD4*, SMARCA4*, SMARCB1*, SMARCE1*, STK11*, SUFU*, TMEM127*, TP53*, TSC1*, TSC2*, VHL*. RNA analysis is performed for * genes.  Based on Ms. Negro's personal and family history of cancer, she does not meet NCCN criteria for genetic testing. However, we are currently recommending genetic testing for all women diagnosed with breast cancer under age 12. She may have an out of pocket cost. We discussed that if her out of pocket cost for testing is over $100, the laboratory will call and confirm whether she wants to proceed with testing.  If the out of pocket cost of testing is less than $100 she will be billed by the genetic testing laboratory.   PLAN: After considering the risks, benefits, and limitations, Valerie [name] provided informed consent to pursue genetic testing and the blood sample was sent to [facility] for analysis of the Multi-Cancer Panel. Results should be available within approximately 2-3 weeks' time, at which point they will be disclosed by telephone to Valerie [name], as will any additional recommendations warranted by these results. Valerie [name] will  receive a summary of her genetic counseling visit and a copy of her results once available. This information will also be available in Epic.   Lastly, we encouraged Valerie [name] to remain in contact with cancer genetics annually so that we can continuously update the family history and inform her of any changes in cancer genetics and testing that may be of benefit for this family.   Valerie [name]'s questions were answered to her satisfaction today. Our contact information was provided should additional questions or concerns arise. Thank you for the referral and allowing [place] to share in the care of your patient.   Lucille Passy, MS, [facility] Genetic Counselor [email or web address] (P) [phone]  The patient was seen for a total of 40 minutes in face-to-face genetic counseling. The patient was seen alone.  Drs. Lindi Adie and/or Burr Medico were available to discuss this case as needed.   _______________________________________________________________________ For Office Staff:  Number of people involved in session: 1  Was an Intern/ student involved with case: yes [facility])

## 2022-06-25 ENCOUNTER — Telehealth: Payer: Self-pay | Admitting: Hematology and Oncology

## 2022-06-25 NOTE — Telephone Encounter (Signed)
Cancelled and scheduled appointment per patients and staff message. Patient is aware of the changes made to her upcoming appointment.

## 2022-06-26 LAB — FOLLICLE STIMULATING HORMONE: FSH: 6.4 m[IU]/mL

## 2022-07-02 ENCOUNTER — Encounter: Payer: Self-pay | Admitting: Hematology and Oncology

## 2022-07-02 LAB — ESTRADIOL, ULTRA SENS: Estradiol, Sensitive: 1465.4 pg/mL

## 2022-07-03 NOTE — Progress Notes (Signed)
HEMATOLOGY-ONCOLOGY TELEPHONE VISIT PROGRESS NOTE  I connected with our patient on 07/04/22 at 12:15 PM EDT by telephone and verified that I am speaking with the correct person using two identifiers.  I discussed the limitations, risks, security and privacy concerns of performing an evaluation and management service by telephone and the availability of in person appointments.  I also discussed with the patient that there may be a patient responsible charge related to this service. The patient expressed understanding and agreed to proceed.   History of Present Illness: Valerie [name] is a 52 y.o. with a history of left breast cancer, Currently on Tamoxifen. She presents to the clinic today for a telephone follow-up to review labs.  Oncology History  Malignant neoplasm of lower-inner quadrant of left breast in female, estrogen receptor positive ([place])  10/23/2020 Initial Diagnosis   Screening mammogram on 09/11/20 showed possible distortion with calcifications in the left breast. Diagnostic mammogram and [place] on 10/03/20 showed 0.6 cm group of indeterminate lower inner left breast calcifications with possible associated subtle distortion and no abnormal appearing left axillary lymph nodes. Biopsy on 10/17/20 showed invasive ductal carcinoma, DCIS with calcifications, ER+(90%)/PR(95%)/Ki67(<5%).   10/24/2020 Cancer Staging   Staging form: Breast, AJCC 8th Edition - Clinical stage from 10/24/2020: Stage IA (cT1b, cN0, cM0, G1, ER+, PR+, HER2-) - Signed by Nicholas Lose, MD on 10/24/2020 Stage prefix: Initial diagnosis Histologic grading system: 3 grade system Laterality: Left Staged by: Pathologist and managing physician Stage used in treatment planning: Yes National guidelines used in treatment planning: Yes Type of national guideline used in treatment planning: NCCN   11/15/2020 Surgery   Left lumpectomy: No residual cancer, fibrocystic change, margins negative, 0/3 lymph nodes negative (based  on the biopsy tumor size: 2 mm), previous ER 90%, PR 95%, HER2 negative, Ki-67 less than 5%   12/25/2020 - 01/18/2021 Radiation Therapy   Adjuvant radiation   02/21/2021 -  Anti-estrogen oral therapy   Tamoxifen     REVIEW OF SYSTEMS:   Constitutional: Denies fevers, chills or abnormal weight loss All other systems were reviewed with the patient and are negative. Observations/Objective:     Assessment Plan:  Malignant neoplasm of lower-inner quadrant of left breast in female, estrogen receptor positive ([place]) 10/23/2020 screening mammogram on 09/11/20 showed possible distortion with calcifications in the left breast. Diagnostic mammogram and [place] on 10/03/20 showed 0.6 cm group of indeterminate lower inner left breast calcifications with possible associated subtle distortion and no abnormal appearing left axillary lymph nodes. Biopsy on 10/17/20 showed invasive ductal carcinoma, DCIS with calcifications, ER+(90%)/PR(95%)/Ki67(<5%).   11/15/2020:Left lumpectomy: No residual cancer, fibrocystic change, margins negative, 0/3 lymph nodes negative (based on the biopsy tumor size: 2 mm), previous ER 90%, PR 95%, HER2 negative, Ki-67 less than 5%   Adjuvant radiation: 12/25/2020-01/18/2021   Treatment plan: Adjuvant antiestrogen therapy with tamoxifen 10 mg daily x5 yrs started November 2022 Tamoxifen toxicities: Elbow pain: Not responding to stopping Tam, oral steroids havent helped her.Advised her to use CBD oil and Voltaren gel Hot flushes    Breast cancer surveillance: mammogram 04/03/2022: benign Density cat C Assymetric redness around the Right Nipple areolar complex: Not very symptomatic hence we will watch and monitor since the mammogram and ultrasound were negative. Breast Exam: 05/13/22: benign, redness in the right breast nipple areolar complex     Recent hysterectomy and unilateral oophorectomy. 06/24/2022: Estradiol: 1465 , FSH: 6.4 Patient is not in menopause.  She did genetic  testing and is awaiting the results.  Return to clinic in 1 year for follow-up    I discussed the assessment and treatment plan with the patient. The patient was provided an opportunity to ask questions and all were answered. The patient agreed with the plan and demonstrated an understanding of the instructions. The patient was advised to call back or seek an in-person evaluation if the symptoms worsen or if the condition fails to improve as anticipated.   I provided 12 minutes of non-face-to-face time during this encounter.  This includes time for charting and coordination of care   Harriette Ohara, MD  I Gardiner Coins am acting as a scribe for Dr.Maurie Musco  I have reviewed the above documentation for accuracy and completeness, and I agree with the above.

## 2022-07-04 ENCOUNTER — Telehealth: Payer: Self-pay | Admitting: Genetic Counselor

## 2022-07-04 ENCOUNTER — Encounter: Payer: Self-pay | Admitting: Genetic Counselor

## 2022-07-04 ENCOUNTER — Inpatient Hospital Stay (HOSPITAL_BASED_OUTPATIENT_CLINIC_OR_DEPARTMENT_OTHER): Payer: Commercial Managed Care - PPO | Admitting: Hematology and Oncology

## 2022-07-04 DIAGNOSIS — C50312 Malignant neoplasm of lower-inner quadrant of left female breast: Secondary | ICD-10-CM | POA: Diagnosis not present

## 2022-07-04 DIAGNOSIS — Z1331 Encounter for screening for depression: Secondary | ICD-10-CM | POA: Insufficient documentation

## 2022-07-04 DIAGNOSIS — Z1379 Encounter for other screening for genetic and chromosomal anomalies: Secondary | ICD-10-CM | POA: Insufficient documentation

## 2022-07-04 DIAGNOSIS — Z170 Estrogen receptor positive status [ER+]: Secondary | ICD-10-CM

## 2022-07-04 NOTE — Telephone Encounter (Signed)
I contacted Ms. Radliff to discuss her genetic testing results. No pathogenic variants were identified in the 70 genes analyzed. Detailed clinic note to follow.  The test report has been scanned into EPIC and is located under the Molecular Pathology section of the Results Review tab.  A portion of the result report is included below for reference.   Lucille Passy, MS, [facility] Genetic Counselor [email or web address] (P) [phone]

## 2022-07-04 NOTE — Assessment & Plan Note (Signed)
10/23/2020 screening mammogram on 09/11/20 showed possible distortion with calcifications in the left breast. Diagnostic mammogram and [place] on 10/03/20 showed 0.6 cm group of indeterminate lower inner left breast calcifications with possible associated subtle distortion and no abnormal appearing left axillary lymph nodes. Biopsy on 10/17/20 showed invasive ductal carcinoma, DCIS with calcifications, ER+(90%)/PR(95%)/Ki67(<5%).   11/15/2020:Left lumpectomy: No residual cancer, fibrocystic change, margins negative, 0/3 lymph nodes negative (based on the biopsy tumor size: 2 mm), previous ER 90%, PR 95%, HER2 negative, Ki-67 less than 5%   Adjuvant radiation: 12/25/2020-01/18/2021   Treatment plan: Adjuvant antiestrogen therapy with tamoxifen 10 mg daily x5 yrs Tamoxifen toxicities: Elbow pain: Not responding to stopping Tam, oral steroids havent helped her.Advised her to use CBD oil and Voltaren gel Hot flushes   She will review the tamoxifen to 10 mg 4 days a week.   Breast cancer surveillance: mammogram 04/03/2022: benign Density cat C Assymetric redness around the Right Nipple areolar complex: Not very symptomatic hence we will watch and monitor since the mammogram and ultrasound were negative. Breast Exam: 05/13/22: benign, redness in the right breast nipple areolar complex     Recent hysterectomy and unilateral oophorectomy. 06/24/2022: Estradiol: 1465 , FSH: 6.4 Patient is not in menopause.  she is interested in genetic testing and we will make a referral for that.   Return to clinic in 1 year for follow-up

## 2022-07-07 ENCOUNTER — Encounter: Payer: Self-pay | Admitting: Genetic Counselor

## 2022-07-07 ENCOUNTER — Ambulatory Visit: Payer: Self-pay | Admitting: Genetic Counselor

## 2022-07-07 DIAGNOSIS — Z1379 Encounter for other screening for genetic and chromosomal anomalies: Secondary | ICD-10-CM

## 2022-07-07 NOTE — Progress Notes (Signed)
HPI:   Ms. Kahley was previously seen in the [facility] due to a personal and family history of cancer and concerns regarding a hereditary predisposition to cancer. Please refer to our prior cancer genetics clinic note for more information regarding our discussion, assessment and recommendations, at the time. Ms. Stoffers's recent genetic test results were disclosed to her, as were recommendations warranted by these results. These results and recommendations are discussed in more detail below.  CANCER HISTORY:  Oncology History  Malignant neoplasm of lower-inner quadrant of left breast in female, estrogen receptor positive ([place])  10/23/2020 Initial Diagnosis   Screening mammogram on 09/11/20 showed possible distortion with calcifications in the left breast. Diagnostic mammogram and [place] on 10/03/20 showed 0.6 cm group of indeterminate lower inner left breast calcifications with possible associated subtle distortion and no abnormal appearing left axillary lymph nodes. Biopsy on 10/17/20 showed invasive ductal carcinoma, DCIS with calcifications, ER+(90%)/PR(95%)/Ki67(<5%).   10/24/2020 Cancer Staging   Staging form: Breast, AJCC 8th Edition - Clinical stage from 10/24/2020: Stage IA (cT1b, cN0, cM0, G1, ER+, PR+, HER2-) - Signed by Nicholas Lose, MD on 10/24/2020 Stage prefix: Initial diagnosis Histologic grading system: 3 grade system Laterality: Left Staged by: Pathologist and managing physician Stage used in treatment planning: Yes National guidelines used in treatment planning: Yes Type of national guideline used in treatment planning: NCCN   11/15/2020 Surgery   Left lumpectomy: No residual cancer, fibrocystic change, margins negative, 0/3 lymph nodes negative (based on the biopsy tumor size: 2 mm), previous ER 90%, PR 95%, HER2 negative, Ki-67 less than 5%   12/25/2020 - 01/18/2021 Radiation Therapy   Adjuvant radiation   02/21/2021 -  Anti-estrogen oral therapy    Tamoxifen    Genetic Testing   [facility] Multi-Cancer Panel+RNA was Negative. Report date is 07/01/2022.  The Multi-Cancer + RNA Panel offered by [facility] includes sequencing and/or deletion/duplication analysis of the following 70 genes:  AIP*, ALK, APC*, ATM*, AXIN2*, BAP1*, BARD1*, BLM*, BMPR1A*, BRCA1*, BRCA2*, BRIP1*, CDC73*, CDH1*, CDK4, CDKN1B*, CDKN2A, CHEK2*, CTNNA1*, DICER1*, EPCAM (del/dup only), EGFR, FH*, FLCN*, GREM1 (promoter dup only), HOXB13, KIT, LZTR1, MAX*, MBD4, MEN1*, MET, MITF, MLH1*, MSH2*, MSH3*, MSH6*, MUTYH*, NF1*, NF2*, NTHL1*, PALB2*, PDGFRA, PMS2*, POLD1*, POLE*, POT1*, PRKAR1A*, PTCH1*, PTEN*, RAD51C*, RAD51D*, RB1*, RET, SDHA* (sequencing only), SDHAF2*, SDHB*, SDHC*, SDHD*, SMAD4*, SMARCA4*, SMARCB1*, SMARCE1*, STK11*, SUFU*, TMEM127*, TP53*, TSC1*, TSC2*, VHL*. RNA analysis is performed for * genes.     FAMILY HISTORY:  We obtained a detailed, 4-generation family history.  Significant diagnoses are noted below:     Ms. Spirito's mother was diagnosed with uterine cancer at age 52. Pathology showed normal IHC. Ms. Weedon reports her mother had negative genetic testing for Lynch Syndrome but we do not have the report to confirm. Patient's maternal ancestors are of [place] and [place] descent, and paternal ancestors are of English descent. There is no reported Ashkenazi Jewish ancestry and no known consanguinity.  GENETIC TEST RESULTS:  The [facility] Multi-Cancer Panel found no pathogenic mutations.   The Multi-Cancer + RNA Panel offered by [facility] includes sequencing and/or deletion/duplication analysis of the following 70 genes:  AIP*, ALK, APC*, ATM*, AXIN2*, BAP1*, BARD1*, BLM*, BMPR1A*, BRCA1*, BRCA2*, BRIP1*, CDC73*, CDH1*, CDK4, CDKN1B*, CDKN2A, CHEK2*, CTNNA1*, DICER1*, EPCAM (del/dup only), EGFR, FH*, FLCN*, GREM1 (promoter dup only), HOXB13, KIT, LZTR1, MAX*, MBD4, MEN1*, MET, MITF, MLH1*, MSH2*, MSH3*, MSH6*, MUTYH*, NF1*, NF2*, NTHL1*, PALB2*, PDGFRA, PMS2*,  POLD1*, POLE*, POT1*, PRKAR1A*, PTCH1*, PTEN*, RAD51C*, RAD51D*, RB1*, RET, SDHA* (sequencing  only), SDHAF2*, SDHB*, SDHC*, SDHD*, SMAD4*, SMARCA4*, SMARCB1*, SMARCE1*, STK11*, SUFU*, TMEM127*, TP53*, TSC1*, TSC2*, VHL*. RNA analysis is performed for * genes.  The test report has been scanned into EPIC and is located under the Molecular Pathology section of the Results Review tab.  A portion of the result report is included below for reference. Genetic testing reported out on 07/01/2022.      Even though a pathogenic variant was not identified, possible explanations for the cancer in the family may include: There may be no hereditary risk for cancer in the family. The cancers in Ms. Gras and/or her family may be due to other genetic or environmental factors. There may be a gene mutation in one of these genes that current testing methods cannot detect, but that chance is small. There could be another gene that has not yet been discovered, or that we have not yet tested, that is responsible for the cancer diagnoses in the family.   Therefore, it is important to remain in touch with cancer genetics in the future so that we can continue to offer Ms. Ladley the most up to date genetic testing.   ADDITIONAL GENETIC TESTING:  We discussed with Ms. Kinch that her genetic testing was fairly extensive.  If there are genes identified to increase cancer risk that can be analyzed in the future, we would be happy to discuss and coordinate this testing at that time.    CANCER SCREENING RECOMMENDATIONS:  Ms. Tuazon's test result is considered negative (normal).  This means that we have not identified a hereditary cause for her personal and family history of cancer at this time.   An individual's cancer risk and medical management are not determined by genetic test results alone. Overall cancer risk assessment incorporates additional factors, including personal medical history, family history, and any  available genetic information that may result in a personalized plan for cancer prevention and surveillance. Therefore, it is recommended she continue to follow the cancer management and screening guidelines provided by her oncology and primary healthcare provider.  RECOMMENDATIONS FOR FAMILY MEMBERS:   Since she did not inherit a mutation in a cancer predisposition gene included on this panel, her children could not have inherited a mutation from her in one of these genes. Individuals in this family might be at some increased risk of developing cancer, over the general population risk, due to the family history of cancer. We recommend women in this family have a yearly mammogram beginning at age 2, or 78 years younger than the earliest onset of cancer, an annual clinical breast exam, and perform monthly breast self-exams.  FOLLOW-UP:  Cancer genetics is a rapidly advancing field and it is possible that new genetic tests will be appropriate for her and/or her family members in the future. We encouraged her to remain in contact with cancer genetics on an annual basis so we can update her personal and family histories and let her know of advances in cancer genetics that may benefit this family.   Our contact number was provided. Ms. Reiland's questions were answered to her satisfaction, and she knows she is welcome to call us at anytime with additional questions or concerns.   Lucille Passy, MS, [facility] Genetic Counselor [email or web address] (P) [phone]

## 2022-07-15 ENCOUNTER — Other Ambulatory Visit: Payer: Commercial Managed Care - PPO

## 2022-07-17 ENCOUNTER — Encounter: Payer: Self-pay | Admitting: Internal Medicine

## 2022-07-17 ENCOUNTER — Other Ambulatory Visit (INDEPENDENT_AMBULATORY_CARE_PROVIDER_SITE_OTHER): Payer: Commercial Managed Care - PPO

## 2022-07-17 DIAGNOSIS — E063 Autoimmune thyroiditis: Secondary | ICD-10-CM

## 2022-07-17 DIAGNOSIS — E038 Other specified hypothyroidism: Secondary | ICD-10-CM | POA: Diagnosis not present

## 2022-07-17 LAB — TSH: TSH: 0.64 u[IU]/mL (ref 0.35–5.50)

## 2022-07-17 LAB — T4, FREE: Free T4: 1.07 ng/dL (ref 0.60–1.60)

## 2022-07-30 ENCOUNTER — Encounter: Payer: Self-pay | Admitting: Hematology and Oncology

## 2022-07-30 ENCOUNTER — Other Ambulatory Visit: Payer: Self-pay | Admitting: Surgery

## 2022-07-30 DIAGNOSIS — Z9889 Other specified postprocedural states: Secondary | ICD-10-CM

## 2022-08-06 ENCOUNTER — Encounter: Payer: Self-pay | Admitting: Rehabilitation

## 2022-08-11 ENCOUNTER — Encounter: Payer: Self-pay | Admitting: Neurology

## 2022-08-19 ENCOUNTER — Encounter: Payer: Self-pay | Admitting: Family Medicine

## 2022-08-19 ENCOUNTER — Ambulatory Visit (INDEPENDENT_AMBULATORY_CARE_PROVIDER_SITE_OTHER): Payer: Commercial Managed Care - PPO | Admitting: Family Medicine

## 2022-08-19 VITALS — BP 116/78 | HR 89 | Temp 98.6°F | Resp 16 | Ht 65.25 in | Wt 178.2 lb

## 2022-08-19 DIAGNOSIS — Z0000 Encounter for general adult medical examination without abnormal findings: Secondary | ICD-10-CM | POA: Diagnosis not present

## 2022-08-19 DIAGNOSIS — E559 Vitamin D deficiency, unspecified: Secondary | ICD-10-CM

## 2022-08-19 NOTE — Progress Notes (Signed)
Phone [phone]   Subjective:   Patient is a 52 y.o. female presenting for annual physical.    Chief Complaint  Patient presents with   Annual Exam    CPE Not fasting    Annual ate 4 hrs ago  See problem oriented charting- ROS- ROS: Gen: no fever, chills  Skin: no rash, itching ENT: no ear pain, ear drainage, nasal congestion, rhinorrhea, sinus pressure, sore throat  some allergies Eyes: no blurry vision, double vision Resp: no cough, wheeze,SOB CV: no CP, palpitations, .  Edema stable on lasix.  If not take lasix, then cough returns. Was able to find leg squeezers at second hand store GI: no heartburn, n/v/d/c, abd pain GU: no dysuria, urgency, frequency, hematuria MSK: no joint pain, myalgias, back pain Neuro: no dizziness, headache, weakness, vertigo Psych: no depression, anxiety, insomnia, SI   The following were reviewed and entered/updated in epic: Past Medical History:  Diagnosis Date   Abnormal echocardiogram 11/28/2021   EF 60 -65%, abnormal global longitudinal strain, see cardiology OV in Epic dated 12/25/21   Cancer (HCC) 09/2020   left breast, ER+, s/p radiation therapy 12/25/20 - 01/18/21, Patient follows with Dr. Serena Croissant @ [facility].   COVID-19 11/2019   flu-like symptoms, cough, no hospitalizations   Edema    lower extremity, follows w/ [facility], Sabino Dick, [place]. See OV dated 03/24/22 in Epic.   GERD (gastroesophageal reflux disease)    Follows w/ Dr. Judeen Hammans, gastroenterologist and PCP, Dr. Maryruth Hancock [facility] @ [facility].   Hashimoto's disease    Follows with Dr. Ernest Haber @ [place].   History of hiatal hernia    2 cm hiatal hernia per 01/08/22 EGD   History of radiation therapy 2022   completed 01-18-2021   Hypothyroidism    Follows w/ endocronologist, Dr. Ernest Haber @ [place].   Lymphedema    left arm, pt uses lymphedema pump at night   Mitral regurgitation    mild by echo 06/2020   Myelin  oligodendrocyte glycoprotein antibody disorder (MOGAD)    Follows with Dr. Despina Arias @ [facility].   Optic neuritis    Follows w/ opthamology, Dr. Mora Appl.03/2020 right eye (almost complete blindness), 04/2020 left eye   PONV (postoperative nausea and vomiting)    Status post dilation of esophageal narrowing 01/08/2022   EGD, esophagus dilated   Wears glasses    Patient Active Problem List   Diagnosis Date Noted   Genetic testing 07/04/2022   Family history of uterine cancer 06/24/2022   Local edema 02/25/2022   Hypothyroidism due to Hashimoto's thyroiditis 06/07/2021   Malignant neoplasm of lower-inner quadrant of left breast in female, estrogen receptor positive (HCC) 10/23/2020   Optic neuritis, left 10/18/2020   Myelin oligodendrocyte glycoprotein antibody disorder (MOGAD) 07/10/2020   Mitral regurgitation    Neuromyelitis optica (devic) (HCC) 05/07/2020   Hypothyroid 04/25/2020   Optic neuritis, right 04/24/2020   Past Surgical History:  Procedure Laterality Date   ABDOMINAL HYSTERECTOMY  04/23/22   BIOPSY BREAST Left 10/17/2020   BREAST LUMPECTOMY WITH RADIOACTIVE SEED AND SENTINEL LYMPH NODE BIOPSY Left 11/15/2020   Procedure: LEFT BREAST LUMPECTOMY WITH RADIOACTIVE SEED AND LEFT SENTINEL LYMPH NODE BIOPSY;  Surgeon: Harriette Bouillon, MD;  Location: [facility];  Service: General;  Laterality: Left;   BREAST SURGERY  August 2022   Cancer surgery   COLONOSCOPY  09/06/2021   cecal polyp   CT CORONARY CA SCORING  07/13/2020  Coronary Calcium = 0 (in Epic)   CYSTOSCOPY N/A 04/23/2022   Procedure: CYSTOSCOPY;  Surgeon: Jerene Bears, MD;  Location: [facility];  Service: Gynecology;  Laterality: N/A;   ESOPHAGOGASTRODUODENOSCOPY  01/08/2022   2cm hiatal hernia, esophagus dilated   TOTAL LAPAROSCOPIC HYSTERECTOMY WITH SALPINGECTOMY Bilateral 04/23/2022   Procedure: TOTAL LAPAROSCOPIC HYSTERECTOMY BILATERAL SALPINGECTOMY, LEFT  OOPHORECTOMY;  Surgeon: Jerene Bears, MD;  Location: [facility];  Service: Gynecology;  Laterality: Bilateral;   TRANSTHORACIC ECHOCARDIOGRAM  11/28/2021   see results in Epic   TRANSTHORACIC ECHOCARDIOGRAM  07/05/2020   in Epic   TUBAL LIGATION  02/2012   WISDOM TOOTH EXTRACTION     early 1990's    Family History  Problem Relation Age of Onset   Hearing loss Mother    Thyroid disease Mother    Cancer Mother 2       endometrial cancer, lynch negative   GER disease Mother    Other Mother        uterine fibroids, optic disc abnormality    Eczema Mother    Colon polyps Mother    Hypothyroidism Mother    Osteoarthritis Mother    Hypertension Mother    Migraines Mother    Colon polyps Father    Hearing loss Father    Other Father        Colon adenoma   Hyperlipidemia Father    GER disease Father    Heart murmur Father    Amblyopia Father        Right eye   Neuropathy Father    Glaucoma Maternal Grandmother    Macular degeneration Maternal Grandmother    Arthritis Maternal Grandmother    Multiple sclerosis Paternal Grandmother    Arthritis Maternal Grandfather    Heart disease Paternal Grandfather    Diabetes Maternal Aunt    Breast cancer Neg Hx    Colon cancer Neg Hx    Esophageal cancer Neg Hx    Rectal cancer Neg Hx    Stomach cancer Neg Hx     Medications- reviewed and updated Current Outpatient Medications  Medication Sig Dispense Refill   ascorbic acid (VITAMIN C) 1000 MG tablet Take 1 tablet by mouth daily.     cholecalciferol (VITAMIN D3) 25 MCG (1000 UNIT) tablet Take 5,000 Units by mouth daily.     furosemide (LASIX) 20 MG tablet Take 1 tablet (20 mg total) by mouth daily. (Patient taking differently: Take 20 mg by mouth as needed.) 90 tablet 2   levothyroxine (SYNTHROID) 88 MCG tablet Take 1 tablet (88 mcg total) by mouth daily before breakfast. (Patient taking differently: Take 88 mcg by mouth. 88 mcg six days a week) 90 tablet 1    MAGNESIUM GLUCONATE PO Take 2 tablets by mouth daily at 12 noon.     omeprazole (PRILOSEC) 20 MG capsule Take 1 capsule (20 mg total) by mouth daily. TAKE 1 CAPSULE BY MOUTH DAILY 90 capsule 3   Probiotic Product (CULTRELLE KIDS IMMUNE DEFENSE PO) Take 1 tablet by mouth daily.     tamoxifen (NOLVADEX) 10 MG tablet Take 1 tablet (10 mg total) by mouth daily. (Patient taking differently: Take 10 mg by mouth. 10 mg four times a week) 90 tablet 3   Tocilizumab (ACTEMRA) 162 MG/0.9ML SOSY Inject 0.9 mLs (162 mg total) into the skin once a week. For MOGAD Takes weekly on Saturday. 10.8 mL 3   UNABLE TO FIND Take 4 tablets by mouth daily. Med  Name: Omega Pure 2 tablet in AM and 1 tablet in PM     UNABLE TO FIND Take by mouth daily. Med Name: Ultra Inflamix 2 scoops daily     UNABLE TO FIND Take 2 tablets by mouth daily. Med Name: R-Lipoic Acid     No current facility-administered medications for this visit.    Allergies-reviewed and updated No Known Allergies  Social History   Social History Narrative   Right handed    Caffeine use: rare   Objective  Objective:  BP 116/78   Pulse 89   Temp 98.6 F (37 C) (Temporal)   Resp 16   Ht 5' 5.25" (1.657 m)   Wt 178 lb 4 oz (80.9 kg)   LMP 12/23/2021 (Exact Date)   SpO2 96%   BMI 29.44 kg/m  Physical Exam  Gen: WDWN NAD HEENT: NCAT, conjunctiva not injected, sclera nonicteric TM WNL B, OP moist, no exudates  NECK:  supple, no thyromegaly, no nodes, no carotid bruits CARDIAC: RRR, S1S2+, no murmur. DP 2+B LUNGS: CTAB. No wheezes ABDOMEN:  BS+, soft, NTND, No HSM, no masses EXT:  no edema MSK: no gross abnormalities. MS 5/5 all 4 NEURO: A&O x3.  CN II-XII intact.  PSYCH: normal mood. Good eye contact     Assessment and Plan   Health Maintenance counseling: 1. Anticipatory guidance: Patient counseled regarding regular dental exams q6 months, eye exams,  avoiding smoking and second hand smoke, limiting alcohol to 1 beverage per day,  no illicit drugs.   2. Risk factor reduction:  Advised patient of need for regular exercise and diet rich and fruits and vegetables to reduce risk of heart attack and stroke. Exercise- +.  Wt Readings from Last 3 Encounters:  08/19/22 178 lb 4 oz (80.9 kg)  06/17/22 178 lb 6.4 oz (80.9 kg)  05/23/22 180 lb 9.6 oz (81.9 kg)   3. Immunizations/screenings/ancillary studies Immunization History  Administered Date(s) Administered   Td 05/22/2015   Tdap 02/05/2007   There are no preventive care reminders to display for this patient.  4. Cervical cancer screening- utd 5. Breast cancer screening-  mammogram utd 6. Colon cancer screening - utd 7. Skin cancer screening- advised regular sunscreen use. Denies worrisome, changing, or new skin lesions.  8. Birth control/STD check- n/a 9. Osteoporosis screening- n/a 10. Smoking associated screening - non smoker  Wellness examination    Recommended follow up: No follow-ups on file.  Lab/Order associations:4 hour fasting  Angelena Sole, MD

## 2022-08-19 NOTE — Patient Instructions (Signed)
It was very nice to see you today!   Wear the leg squeezers daily   PLEASE NOTE:  If you had any lab tests please let us know if you have not heard back within a few days. You may see your results on MyChart before we have a chance to review them but we will give you a call once they are reviewed by [place]. If we ordered any referrals today, please let us know if you have not heard from their office within the next week.   Please try these tips to maintain a healthy lifestyle:  Eat most of your calories during the day when you are active. Eliminate processed foods including packaged sweets (pies, cakes, cookies), reduce intake of potatoes, white bread, white pasta, and white rice. Look for whole grain options, oat flour or almond flour.  Each meal should contain half fruits/vegetables, one quarter protein, and one quarter carbs (no bigger than a computer mouse).  Cut down on sweet beverages. This includes juice, soda, and sweet tea. Also watch fruit intake, though this is a healthier sweet option, it still contains natural sugar! Limit to 3 servings daily.  Drink at least 1 glass of water with each meal and aim for at least 8 glasses per day  Exercise at least 150 minutes every week.

## 2022-08-20 ENCOUNTER — Encounter: Payer: Self-pay | Admitting: Family Medicine

## 2022-08-20 LAB — HEMOGLOBIN A1C: Hgb A1c MFr Bld: 5.4 % (ref 4.6–6.5)

## 2022-08-20 LAB — COMPREHENSIVE METABOLIC PANEL
ALT: 30 U/L (ref 0–35)
AST: 23 U/L (ref 0–37)
Albumin: 3.8 g/dL (ref 3.5–5.2)
Alkaline Phosphatase: 35 U/L — ABNORMAL LOW (ref 39–117)
BUN: 25 mg/dL — ABNORMAL HIGH (ref 6–23)
CO2: 29 mEq/L (ref 19–32)
Calcium: 8.6 mg/dL (ref 8.4–10.5)
Chloride: 105 mEq/L (ref 96–112)
Creatinine, Ser: 1.23 mg/dL — ABNORMAL HIGH (ref 0.40–1.20)
GFR: 50.4 mL/min — ABNORMAL LOW (ref >60.00)
Glucose, Bld: 100 mg/dL — ABNORMAL HIGH (ref 70–99)
Potassium: 4.6 mEq/L (ref 3.5–5.1)
Sodium: 140 mEq/L (ref 135–145)
Total Bilirubin: 0.3 mg/dL (ref 0.2–1.2)
Total Protein: 6.2 g/dL (ref 6.0–8.3)

## 2022-08-20 LAB — LIPID PANEL
Cholesterol: 155 mg/dL (ref 0–200)
HDL: 49.2 mg/dL (ref >39.00)
LDL Cholesterol: 80 mg/dL (ref 0–99)
NonHDL: 105.45
Total CHOL/HDL Ratio: 3
Triglycerides: 129 mg/dL (ref 0.0–149.0)
VLDL: 25.8 mg/dL (ref 0.0–40.0)

## 2022-08-20 LAB — CBC WITH DIFFERENTIAL/PLATELET
Basophils Absolute: 0 10*3/uL (ref 0.0–0.1)
Basophils Relative: 0.8 % (ref 0.0–3.0)
Eosinophils Absolute: 0.5 10*3/uL (ref 0.0–0.7)
Eosinophils Relative: 8.1 % — ABNORMAL HIGH (ref 0.0–5.0)
HCT: 43 % (ref 36.0–46.0)
Hemoglobin: 14.6 g/dL (ref 12.0–15.0)
Lymphocytes Relative: 28.8 % (ref 12.0–46.0)
Lymphs Abs: 1.7 10*3/uL (ref 0.7–4.0)
MCHC: 33.9 g/dL (ref 30.0–36.0)
MCV: 97.3 fl (ref 78.0–100.0)
Monocytes Absolute: 0.8 10*3/uL (ref 0.1–1.0)
Monocytes Relative: 12.6 % — ABNORMAL HIGH (ref 3.0–12.0)
Neutro Abs: 3 10*3/uL (ref 1.4–7.7)
Neutrophils Relative %: 49.7 % (ref 43.0–77.0)
Platelets: 190 10*3/uL (ref 150.0–400.0)
RBC: 4.42 Mil/uL (ref 3.87–5.11)
RDW: 12.2 % (ref 11.5–15.5)
WBC: 6 10*3/uL (ref 4.0–10.5)

## 2022-08-21 NOTE — Progress Notes (Signed)
Kidney function has worsened-could be from the daily lasix and they are too dried out.  Is she taking a lot of ibuprofen/aleve?   Suggest drinking plenty of water and repeat BMP in 1 month.   Rest of labs stable/ok

## 2022-08-25 ENCOUNTER — Encounter (HOSPITAL_BASED_OUTPATIENT_CLINIC_OR_DEPARTMENT_OTHER): Payer: Self-pay | Admitting: Obstetrics & Gynecology

## 2022-08-29 ENCOUNTER — Telehealth: Payer: Self-pay | Admitting: Diagnostic Neuroimaging

## 2022-08-29 ENCOUNTER — Emergency Department (HOSPITAL_BASED_OUTPATIENT_CLINIC_OR_DEPARTMENT_OTHER)
Admission: EM | Admit: 2022-08-29 | Discharge: 2022-08-29 | Disposition: A | Discharge status: Home / Self Care | Payer: Commercial Managed Care - PPO | Attending: Emergency Medicine | Admitting: Emergency Medicine

## 2022-08-29 ENCOUNTER — Encounter: Payer: Self-pay | Admitting: Neurology

## 2022-08-29 ENCOUNTER — Other Ambulatory Visit: Payer: Self-pay

## 2022-08-29 ENCOUNTER — Encounter (HOSPITAL_BASED_OUTPATIENT_CLINIC_OR_DEPARTMENT_OTHER): Payer: Self-pay | Admitting: Emergency Medicine

## 2022-08-29 ENCOUNTER — Other Ambulatory Visit (HOSPITAL_BASED_OUTPATIENT_CLINIC_OR_DEPARTMENT_OTHER): Payer: Self-pay

## 2022-08-29 ENCOUNTER — Encounter: Payer: Self-pay | Admitting: Internal Medicine

## 2022-08-29 DIAGNOSIS — R42 Dizziness and giddiness: Secondary | ICD-10-CM | POA: Diagnosis present

## 2022-08-29 DIAGNOSIS — Z853 Personal history of malignant neoplasm of breast: Secondary | ICD-10-CM | POA: Insufficient documentation

## 2022-08-29 LAB — CBC WITH DIFFERENTIAL/PLATELET
Abs Immature Granulocytes: 0.01 10*3/uL (ref 0.00–0.07)
Basophils Absolute: 0 10*3/uL (ref 0.0–0.1)
Basophils Relative: 0 %
Eosinophils Absolute: 0.4 10*3/uL (ref 0.0–0.5)
Eosinophils Relative: 7 %
HCT: 44.2 % (ref 36.0–46.0)
Hemoglobin: 14.7 g/dL (ref 12.0–15.0)
Immature Granulocytes: 0 %
Lymphocytes Relative: 25 %
Lymphs Abs: 1.4 10*3/uL (ref 0.7–4.0)
MCH: 32.5 pg (ref 26.0–34.0)
MCHC: 33.3 g/dL (ref 30.0–36.0)
MCV: 97.8 fL (ref 80.0–100.0)
Monocytes Absolute: 0.7 10*3/uL (ref 0.1–1.0)
Monocytes Relative: 13 %
Neutro Abs: 3 10*3/uL (ref 1.7–7.7)
Neutrophils Relative %: 55 %
Platelets: 182 10*3/uL (ref 150–400)
RBC: 4.52 MIL/uL (ref 3.87–5.11)
RDW: 12 % (ref 11.5–15.5)
WBC: 5.5 10*3/uL (ref 4.0–10.5)
nRBC: 0 % (ref 0.0–0.2)

## 2022-08-29 LAB — COMPREHENSIVE METABOLIC PANEL
ALT: 27 U/L (ref 0–44)
AST: 19 U/L (ref 15–41)
Albumin: 3.8 g/dL (ref 3.5–5.0)
Alkaline Phosphatase: 35 U/L — ABNORMAL LOW (ref 38–126)
Anion gap: 5 (ref 5–15)
BUN: 18 mg/dL (ref 6–20)
CO2: 28 mmol/L (ref 22–32)
Calcium: 8.6 mg/dL — ABNORMAL LOW (ref 8.9–10.3)
Chloride: 110 mmol/L (ref 98–111)
Creatinine, Ser: 0.83 mg/dL (ref 0.44–1.00)
GFR, Estimated: >60 mL/min (ref >60)
Glucose, Bld: 100 mg/dL — ABNORMAL HIGH (ref 70–99)
Potassium: 4.4 mmol/L (ref 3.5–5.1)
Sodium: 143 mmol/L (ref 135–145)
Total Bilirubin: 0.4 mg/dL (ref 0.3–1.2)
Total Protein: 6.1 g/dL — ABNORMAL LOW (ref 6.5–8.1)

## 2022-08-29 LAB — URINALYSIS, ROUTINE W REFLEX MICROSCOPIC
Bilirubin Urine: NEGATIVE
Glucose, UA: NEGATIVE mg/dL
Hgb urine dipstick: NEGATIVE
Ketones, ur: NEGATIVE mg/dL
Leukocytes,Ua: NEGATIVE
Nitrite: NEGATIVE
Protein, ur: NEGATIVE mg/dL
Specific Gravity, Urine: 1.005 (ref 1.005–1.030)
pH: 7.5 (ref 5.0–8.0)

## 2022-08-29 LAB — TSH: TSH: 1.664 u[IU]/mL (ref 0.350–4.500)

## 2022-08-29 MED ORDER — ONDANSETRON 4 MG PO TBDP
4.0000 mg | ORAL_TABLET | Freq: Three times a day (TID) | ORAL | 0 refills | Status: DC | PRN
  Filled 2022-08-29: qty 20, 7d supply, fill #0

## 2022-08-29 MED ORDER — LACTATED RINGERS IV BOLUS
1000.0000 mL | Freq: Once | INTRAVENOUS | Status: AC
  Administered 2022-08-29: 1000 mL via INTRAVENOUS

## 2022-08-29 MED ORDER — MECLIZINE HCL 25 MG PO TABS
25.0000 mg | ORAL_TABLET | Freq: Three times a day (TID) | ORAL | 0 refills | Status: DC | PRN
  Filled 2022-08-29: qty 30, 10d supply, fill #0

## 2022-08-29 MED ORDER — MECLIZINE HCL 25 MG PO TABS
25.0000 mg | ORAL_TABLET | Freq: Once | ORAL | Status: AC
  Administered 2022-08-29: 25 mg via ORAL
  Filled 2022-08-29: qty 1

## 2022-08-29 NOTE — Discharge Instructions (Addendum)
Thank you for allowing me to be a part of your care today.   Your workup today is overall reassuring.  Your kidney function has returned to normal.  Your calcium is a little bit low, but you do not have evidence of infection or anemia.   I do recommend following up with your primary care provider and neurologist.  If you continue to have vertigo symptoms, you may also want to follow-up with ENT provider.  I have sent over a prescription for meclizine (Antivert) to help with vertigo like symptoms.  You may take this medication up to 3 times a day as needed.  I have also sent over ondansetron (Zofran) disintegrating tablet for you to use for nausea.  You will place this tablet under your tongue to dissolve.   Return to the ED if you experience sudden worsening of your symptoms, have weakness, numbness, severe headache, other neurological symptoms, or if you have any new concerns.

## 2022-08-29 NOTE — ED Provider Notes (Signed)
[facility] AT [facility] Provider Note   CSN: [id number] Arrival date & time: 08/29/22  4098     History  Chief Complaint  Patient presents with   Dizziness    Valerie [name] is a 52 y.o. female with past medical history significant for breast cancer, mitral regurgitation, Hashimoto's disease, MOGAD, optic neuritis, lymphedema presents to the ED complaining of dizziness upon standing that occurred when she got out of bed this morning.  She states that he felt "everything pull to the left" initially, but now she feels unsteady and like "being on a boat" with associated nausea.  Patient without prior history of vertigo.  She was told at her most recent PCP visit that her kidney function had worsened, which may be due to daily Lasix use.  Patient has since started using less Lasix.  She states she feels poorly overall currently.  Denies facial asymmetry, unilateral weakness, numbness, speech difficulty, syncope, headache, light-headedness, vomiting.         Home Medications Prior to Admission medications   Medication Sig Start Date End Date Taking? Authorizing Provider  meclizine (ANTIVERT) 25 MG tablet Take 1 tablet (25 mg total) by mouth 3 (three) times daily as needed for dizziness. 08/29/22  Yes Tajha Sammarco R, PA-C  ondansetron (ZOFRAN-ODT) 4 MG disintegrating tablet Dissolve 1 tablet under the tongue every 8 (eight) hours as needed for nausea or vomiting. 08/29/22  Yes Dawnell Bryant R, PA-C  ascorbic acid (VITAMIN C) 1000 MG tablet Take 1 tablet by mouth daily.    [provider]  cholecalciferol (VITAMIN D3) 25 MCG (1000 UNIT) tablet Take 5,000 Units by mouth daily.    [provider]  furosemide (LASIX) 20 MG tablet Take 1 tablet (20 mg total) by mouth daily. Patient taking differently: Take 20 mg by mouth as needed. 02/26/22   [name], MD  levothyroxine (SYNTHROID) 88 MCG tablet Take 1 tablet (88 mcg total) by mouth  daily before breakfast. Patient taking differently: Take 88 mcg by mouth. 88 mcg six days a week 04/16/22   Carlus Pavlov, MD  MAGNESIUM GLUCONATE PO Take 2 tablets by mouth daily at 12 noon.    [provider]  omeprazole (PRILOSEC) 20 MG capsule Take 1 capsule (20 mg total) by mouth daily. TAKE 1 CAPSULE BY MOUTH DAILY 02/21/22   Imogene Burn, MD  Probiotic Product (CULTRELLE KIDS IMMUNE DEFENSE PO) Take 1 tablet by mouth daily.    [provider]  tamoxifen (NOLVADEX) 10 MG tablet Take 1 tablet (10 mg total) by mouth daily. Patient taking differently: Take 10 mg by mouth. 10 mg four times a week 02/03/22   Serena Croissant, MD  Tocilizumab (ACTEMRA) 162 MG/0.9ML SOSY Inject 0.9 mLs (162 mg total) into the skin once a week. For MOGAD Takes weekly on Saturday. 06/18/22   Sater, Pearletha Furl, MD  UNABLE TO FIND Take 4 tablets by mouth daily. Med Name: Omega Pure 2 tablet in AM and 1 tablet in PM    [provider]  UNABLE TO FIND Take by mouth daily. Med Name: Ultra Inflamix 2 scoops daily    [provider]  UNABLE TO FIND Take 2 tablets by mouth daily. Med Name: R-Lipoic Acid    [provider]      Allergies    Patient has no known allergies.    Review of Systems   Review of Systems  Gastrointestinal:  Positive for nausea. Negative for vomiting.  Neurological:  Positive for dizziness. Negative for syncope, facial asymmetry, speech difficulty, weakness, light-headedness, numbness and headaches.    Physical Exam Updated Vital Signs BP 125/89   Pulse 73   Temp 98.2 F (36.8 C) (Oral)   Resp 17   Ht 5\' 2"  (1.575 m)   Wt 79.4 kg   LMP 12/23/2021 (Exact Date)   SpO2 97%   BMI 32.01 kg/m  Physical Exam Vitals and nursing note reviewed.  Constitutional:      General: She is not in acute distress.    Appearance: Normal appearance. She is not ill-appearing or diaphoretic.  Eyes:     General: Lids are normal. Vision grossly intact.      Extraocular Movements: Extraocular movements intact.     Conjunctiva/sclera: Conjunctivae normal.     Comments: Bilateral, rapid, left beating horizontal nystagmus when patient focusing, but this is not reproducible with rotation of the head  Cardiovascular:     Rate and Rhythm: Normal rate and regular rhythm.  Pulmonary:     Effort: Pulmonary effort is normal.  Skin:    General: Skin is warm and dry.     Capillary Refill: Capillary refill takes less than 2 seconds.  Neurological:     General: No focal deficit present.     Mental Status: She is alert and oriented to person, place, and time. Mental status is at baseline.     GCS: GCS eye subscore is 4. GCS verbal subscore is 5. GCS motor subscore is 6.     Cranial Nerves: Cranial nerves 2-12 are intact.     Sensory: Sensation is intact.     Motor: Motor function is intact. No weakness, abnormal muscle tone or pronator drift.     Coordination: Coordination is intact.     Gait: Gait is intact.     Comments: Cranial Nerves:  II: peripheral fields grossly intact III,IV, VI: ptosis not present, extra-ocular movements intact bilaterally, direct and consensual pupillary light reflexes intact bilaterally V: facial sensation, jaw opening, and bite strength equal bilaterally VII: eyebrow raise, eyelid close, smile, frown, pucker equal bilaterally VIII: hearing grossly normal bilaterally  IX,X: palate elevation and swallowing intact XI: bilateral shoulder shrug and lateral head rotation equal and strong XII: midline tongue extension Motor: 5/5 strength in BUE and BLE with equal grip   Psychiatric:        Attention and Perception: Attention and perception normal.        Mood and Affect: Mood normal.        Speech: Speech normal.        Behavior: Behavior normal.        Thought Content: Thought content normal.     ED Results / Procedures / Treatments   Labs (all labs ordered are listed, but only abnormal results are displayed) Labs  Reviewed  COMPREHENSIVE METABOLIC PANEL - Abnormal; Notable for the following components:      Result Value   Glucose, Bld 100 (*)    Calcium 8.6 (*)    Total Protein 6.1 (*)    Alkaline Phosphatase 35 (*)    All other components within normal limits  URINALYSIS, ROUTINE W REFLEX MICROSCOPIC - Abnormal; Notable for the following components:   Color, Urine COLORLESS (*)    All other components within normal limits  CBC WITH DIFFERENTIAL/PLATELET  TSH  CBG MONITORING, ED    EKG EKG Interpretation  Date/Time:  Friday Aug 29 2022 08:54:07 EDT Ventricular Rate:  79 PR Interval:  200  QRS Duration: 93 QT Interval:  389 QTC Calculation: 446 R Axis:   28 Text Interpretation: Sinus rhythm Low voltage, precordial leads Borderline T abnormalities, diffuse leads No significant change since last tracing Confirmed by Elayne Snare ([id number]) on 08/29/2022 9:06:47 AM  Radiology No results found.  Procedures Procedures    Medications Ordered in ED Medications  meclizine (ANTIVERT) tablet 25 mg (25 mg Oral Given 08/29/22 0952)  lactated ringers bolus 1,000 mL (1,000 mLs Intravenous New Bag/Given 08/29/22 1610)    ED Course/ Medical Decision Making/ A&P                             Medical Decision Making Amount and/or Complexity of Data Reviewed Labs: ordered.  Risk Prescription drug management.   This patient presents to the ED with chief complaint(s) of dizziness, nausea with pertinent past medical history of MOGAD, optic neuritis, Hashimoto's.  The complaint involves an extensive differential diagnosis and also carries with it a high risk of complications and morbidity.    The differential diagnosis includes vertigo, dehydration, electrolyte derangement, Meniere's disease, MOGAD flare, anemia, renal failure, thyroid disease, hypoglycemia   The initial plan is to obtain baseline labs, EKG  Initial Assessment:   Exam significant for bilateral, rapid, left beating horizontal  nystagmus when patient is focusing, but this is not reproducible with rotation of the head.  HINTS test with normal vestibulo-ocular reflex function, horizontal unilateral nystagmus, and no skew deviation.  EOM intact. PERRLA.  Neuro exam is unremarkable.  No unilateral weakness, cranial nerve deficits, or sensory deficits.  Patient observed walking with a steady gait.    Independent ECG/labs interpretation:  The following labs were independently interpreted:  CBC without anemia or leukocytosis.  Metabolic panel with mild hypocalcemia, no other electrolyte disturbance.  Renal function has returned to patient's baseline.  TSH within normal range.  No evidence of hypoglycemia.  Based on workup and patient's physical exam, suspect that dizziness is related to a peripheral vertigo and that no imaging is required at this time.    Treatment and Reassessment: Will give patient IV fluids and meclizine for suspected vertigo.  Will perform orthostatics following fluid treatment.  Upon reassessment, patient is resting comfortably in bed and is playing a game on an iPad.  She reports feeling relatively the same following the meclizine.  Patient states that she feels fine when she is sitting, but does experience increased dizziness when changing positions.  Orthostatics are negative, which is reassuring.  Disposition:   Will send patient home on meclizine and Zofran for symptomatic management of suspected vertigo.  Recommended patient follow-up with her primary care provider and neurologist.  Patient has audiology appointment on Monday and was advised to follow-up with ENT should her vertigo symptoms continue.  Discussed strict return precautions including worsening of symptoms or development of other neurological symptoms.  Workup today has been reassuring.    The patient has been appropriately medically screened and/or stabilized in the ED. I have low suspicion for any other emergent medical condition which  would require further screening, evaluation or treatment in the ED or require inpatient management. At time of discharge the patient is hemodynamically stable and in no acute distress. I have discussed work-up results and diagnosis with patient and answered all questions. Patient is agreeable with discharge plan. We discussed strict return precautions for returning to the emergency department and they verbalized understanding.  Final Clinical Impression(s) / ED Diagnoses Final diagnoses:  Vertigo    Rx / DC Orders ED Discharge Orders          Ordered    meclizine (ANTIVERT) 25 MG tablet  3 times daily PRN        08/29/22 1233    ondansetron (ZOFRAN-ODT) 4 MG disintegrating tablet  Every 8 hours PRN        08/29/22 1237              Lenard Simmer, PA-C 08/29/22 1241    Elayne Snare K, DO 08/29/22 1522

## 2022-08-29 NOTE — Telephone Encounter (Signed)
Patient called this morning due to waking up and feeling dizzy, lightheaded.  No other symptoms.  Recent renal function on 08/19/2022 showed slightly decreased kidney function, possibly from volume depletion and Lasix use.  Recommended patient go to urgent care to get repeat labs and evaluation for new onset of symptoms.  Suanne Marker, MD 08/29/2022, 9:45 AM Certified in Neurology, Neurophysiology and Neuroimaging  [facility] [address] [place], [place] [address] [phone]

## 2022-08-29 NOTE — ED Triage Notes (Signed)
Pt here from home with c/o dizziness upon standing some slight nausea but no vomiting

## 2022-09-01 NOTE — Telephone Encounter (Signed)
Pt had appt w/ Audiologist today. They placed referral to ENT for further evaluation

## 2022-09-03 ENCOUNTER — Encounter (HOSPITAL_BASED_OUTPATIENT_CLINIC_OR_DEPARTMENT_OTHER): Payer: Self-pay | Admitting: Obstetrics & Gynecology

## 2022-09-03 ENCOUNTER — Ambulatory Visit (HOSPITAL_BASED_OUTPATIENT_CLINIC_OR_DEPARTMENT_OTHER): Payer: Commercial Managed Care - PPO | Admitting: Obstetrics & Gynecology

## 2022-09-03 ENCOUNTER — Ambulatory Visit (INDEPENDENT_AMBULATORY_CARE_PROVIDER_SITE_OTHER): Payer: Commercial Managed Care - PPO | Admitting: Obstetrics & Gynecology

## 2022-09-03 ENCOUNTER — Ambulatory Visit (INDEPENDENT_AMBULATORY_CARE_PROVIDER_SITE_OTHER): Payer: Commercial Managed Care - PPO

## 2022-09-03 ENCOUNTER — Encounter (HOSPITAL_BASED_OUTPATIENT_CLINIC_OR_DEPARTMENT_OTHER): Payer: Self-pay

## 2022-09-03 VITALS — BP 117/69 | HR 67 | Ht 65.5 in | Wt 181.0 lb

## 2022-09-03 DIAGNOSIS — R102 Pelvic and perineal pain: Secondary | ICD-10-CM

## 2022-09-03 DIAGNOSIS — N83201 Unspecified ovarian cyst, right side: Secondary | ICD-10-CM | POA: Diagnosis not present

## 2022-09-09 NOTE — Progress Notes (Signed)
GYNECOLOGY  VISIT  CC:   f/u after ultrasound   HPI: 52 y.o. G3P0012 [name] here for discussion of ultrasound results.  She has been having some pelvic pain and was a little worried.  H/o TLH/ bilateral salpingectomy and LSO 04/23/2022.    Ultrasound showed a 2.9 x 2.5cm simple ovarian cyst.  Given benign appearance, do not feel follow up imaging is needed unless pain does not resolved.  Advised this could be present for at least another two weeks but if has not resolved after 4 weeks, I would plan repeat imaging.  Pt comfortable with plan.   Past Medical History:  Diagnosis Date   Abnormal echocardiogram 11/28/2021   EF 60 -65%, abnormal global longitudinal strain, see cardiology OV in Epic dated 12/25/21   Cancer (HCC) 09/2020   left breast, ER+, s/p radiation therapy 12/25/20 - 01/18/21, Patient follows with Dr. Serena [name] @ [facility].   COVID-19 11/2019   flu-like symptoms, cough, no hospitalizations   Edema    lower extremity, follows w/ [facility], Valerie [name], [place]. See OV dated 03/24/22 in Epic.   GERD (gastroesophageal reflux disease)    Follows w/ Dr. Judeen [name], gastroenterologist and PCP, Dr. Maryruth [name] [facility] @ [facility].   Hashimoto's disease    Follows with Dr. Ernest [name] @ [place].   History of hiatal hernia    2 cm hiatal hernia per 01/08/22 EGD   History of radiation therapy 2022   completed 01-18-2021   Hypothyroidism    Follows w/ endocronologist, Dr. Ernest [name] @ [place].   Lymphedema    left arm, pt uses lymphedema pump at night   Mitral regurgitation    mild by echo 06/2020   Myelin oligodendrocyte glycoprotein antibody disorder (MOGAD)    Follows with Dr. Despina [name] @ [facility].   Optic neuritis    Follows w/ opthamology, Dr. Mora [name].03/2020 right eye (almost complete blindness), 04/2020 left eye   PONV (postoperative nausea and vomiting)    Status post dilation of esophageal  narrowing 01/08/2022   EGD, esophagus dilated   Wears glasses     MEDS:   Current Outpatient Medications on File Prior to Visit  Medication Sig Dispense Refill   ascorbic acid (VITAMIN C) 1000 MG tablet Take 1 tablet by mouth daily.     cholecalciferol (VITAMIN D3) 25 MCG (1000 UNIT) tablet Take 5,000 Units by mouth daily.     furosemide (LASIX) 20 MG tablet Take 1 tablet (20 mg total) by mouth daily. (Patient taking differently: Take 20 mg by mouth as needed.) 90 tablet 2   levothyroxine (SYNTHROID) 88 MCG tablet Take 1 tablet (88 mcg total) by mouth daily before breakfast. (Patient taking differently: Take 88 mcg by mouth. 88 mcg six days a week) 90 tablet 1   MAGNESIUM GLUCONATE PO Take 2 tablets by mouth daily at 12 noon.     meclizine (ANTIVERT) 25 MG tablet Take 1 tablet (25 mg total) by mouth 3 (three) times daily as needed for dizziness. 30 tablet 0   omeprazole (PRILOSEC) 20 MG capsule Take 1 capsule (20 mg total) by mouth daily. TAKE 1 CAPSULE BY MOUTH DAILY 90 capsule 3   ondansetron (ZOFRAN-ODT) 4 MG disintegrating tablet Dissolve 1 tablet under the tongue every 8 (eight) hours as needed for nausea or vomiting. 20 tablet 0   Probiotic Product (CULTRELLE KIDS IMMUNE DEFENSE PO) Take 1 tablet by mouth daily.     tamoxifen (NOLVADEX)  10 MG tablet Take 1 tablet (10 mg total) by mouth daily. (Patient taking differently: Take 10 mg by mouth. 10 mg four times a week) 90 tablet 3   Tocilizumab (ACTEMRA) 162 MG/0.9ML SOSY Inject 0.9 mLs (162 mg total) into the skin once a week. For MOGAD Takes weekly on Saturday. 10.8 mL 3   UNABLE TO FIND Take 4 tablets by mouth daily. Med Name: Omega Pure 2 tablet in AM and 1 tablet in PM     UNABLE TO FIND Take by mouth daily. Med Name: Ultra Inflamix 2 scoops daily     UNABLE TO FIND Take 2 tablets by mouth daily. Med Name: R-Lipoic Acid     No current facility-administered medications on file prior to visit.    ALLERGIES: Patient has no known  allergies.  SH:  married, non smoker  Review of Systems  Constitutional: Negative.   Genitourinary:        Pelvic pain    PHYSICAL EXAMINATION:    BP 117/69 (BP Location: Right Arm, Patient Position: Sitting, Cuff Size: Large)   Pulse 67   Ht 5' 5.5" (1.664 m) Comment: Reported  Wt 181 lb (82.1 kg)   LMP 12/23/2021 (Exact Date)   BMI 29.66 kg/m     General appearance: alert, cooperative and appears stated age   Assessment/Plan: 1. Pelvic pain - simple ovarian cyst seen on ovary today - if pain not fully resolved in next 4 weeks, pt to call and would repeat ultrasound to ensure cyst has resolved or, at least, is resolving.  Questions answered.  Pt comfortable with plan.  2. Cyst of right ovary

## 2022-09-15 ENCOUNTER — Ambulatory Visit
Admission: RE | Admit: 2022-09-15 | Discharge: 2022-09-15 | Disposition: A | Discharge status: Home / Self Care | Payer: Commercial Managed Care - PPO | Source: Ambulatory Visit | Attending: Surgery | Admitting: Surgery

## 2022-09-15 DIAGNOSIS — Z9889 Other specified postprocedural states: Secondary | ICD-10-CM

## 2022-09-15 HISTORY — DX: Malignant neoplasm of unspecified site of unspecified female breast: C50.919

## 2022-09-16 ENCOUNTER — Encounter: Payer: Self-pay | Admitting: Hematology and Oncology

## 2022-09-17 ENCOUNTER — Other Ambulatory Visit: Payer: Self-pay | Admitting: Internal Medicine

## 2022-09-17 DIAGNOSIS — E063 Autoimmune thyroiditis: Secondary | ICD-10-CM

## 2022-10-02 ENCOUNTER — Encounter: Payer: Self-pay | Admitting: Rehabilitation

## 2022-10-06 ENCOUNTER — Ambulatory Visit: Payer: Commercial Managed Care - PPO | Admitting: Neurology

## 2022-10-06 ENCOUNTER — Ambulatory Visit: Payer: Commercial Managed Care - PPO | Attending: Surgery | Admitting: Rehabilitation

## 2022-10-06 ENCOUNTER — Encounter: Payer: Self-pay | Admitting: Rehabilitation

## 2022-10-06 DIAGNOSIS — C50312 Malignant neoplasm of lower-inner quadrant of left female breast: Secondary | ICD-10-CM | POA: Insufficient documentation

## 2022-10-06 DIAGNOSIS — Z170 Estrogen receptor positive status [ER+]: Secondary | ICD-10-CM | POA: Insufficient documentation

## 2022-10-06 DIAGNOSIS — Z483 Aftercare following surgery for neoplasm: Secondary | ICD-10-CM | POA: Insufficient documentation

## 2022-10-06 NOTE — Therapy (Signed)
OUTPATIENT PHYSICAL THERAPY SOZO SCREENING NOTE   Patient Name: Valerie [name] MRN: [id number] DOB:Oct 29, 1970, 52 y.o., female Today's Date: 10/06/2022  PCP: [name], MD REFERRING PROVIDER: Harriette Bouillon, MD   PT End of Session - 10/06/22 2029     Visit Number 16   screen only   PT Start Time 1655    PT Stop Time 1715    PT Time Calculation (min) 20 min    Activity Tolerance Patient tolerated treatment well    Behavior During Therapy [facility] for tasks assessed/performed             Past Medical History:  Diagnosis Date   Abnormal echocardiogram 11/28/2021   EF 60 -65%, abnormal global longitudinal strain, see cardiology OV in Epic dated 12/25/21   Breast cancer (HCC)    Cancer (HCC) 09/2020   left breast, ER+, s/p radiation therapy 12/25/20 - 01/18/21, Patient follows with Dr. Serena Croissant @ [facility].   COVID-19 11/2019   flu-like symptoms, cough, no hospitalizations   Edema    lower extremity, follows w/ [facility], Sabino Dick, [place]. See OV dated 03/24/22 in Epic.   GERD (gastroesophageal reflux disease)    Follows w/ Dr. Judeen Hammans, gastroenterologist and PCP, Dr. Maryruth [facility] @ [facility].   Hashimoto's disease    Follows with Dr. Ernest Haber @ [place].   History of hiatal hernia    2 cm hiatal hernia per 01/08/22 EGD   History of radiation therapy 2022   completed 01-18-2021   Hypothyroidism    Follows w/ endocronologist, Dr. Ernest Haber @ [place].   Lymphedema    left arm, pt uses lymphedema pump at night   Mitral regurgitation    mild by echo 06/2020   Myelin oligodendrocyte glycoprotein antibody disorder (MOGAD)    Follows with Dr. Despina Arias @ [facility].   Optic neuritis    Follows w/ opthamology, Dr. Mora Appl.03/2020 right eye (almost complete blindness), 04/2020 left eye   Personal history of radiation therapy    PONV (postoperative nausea and vomiting)    Status post dilation of  esophageal narrowing 01/08/2022   EGD, esophagus dilated   Wears glasses    Past Surgical History:  Procedure Laterality Date   ABDOMINAL HYSTERECTOMY  04/23/22   BIOPSY BREAST Left 10/17/2020   BREAST LUMPECTOMY     BREAST LUMPECTOMY WITH RADIOACTIVE SEED AND SENTINEL LYMPH NODE BIOPSY Left 11/15/2020   Procedure: LEFT BREAST LUMPECTOMY WITH RADIOACTIVE SEED AND LEFT SENTINEL LYMPH NODE BIOPSY;  Surgeon: Harriette Bouillon, MD;  Location:  SURGERY CENTER;  Service: General;  Laterality: Left;   BREAST SURGERY  August 2022   Cancer surgery   COLONOSCOPY  09/06/2021   cecal polyp   CT CORONARY CA SCORING  07/13/2020   Coronary Calcium = 0 (in Epic)   CYSTOSCOPY N/A 04/23/2022   Procedure: CYSTOSCOPY;  Surgeon: Jerene Bears, MD;  Location: [facility];  Service: Gynecology;  Laterality: N/A;   ESOPHAGOGASTRODUODENOSCOPY  01/08/2022   2cm hiatal hernia, esophagus dilated   TOTAL LAPAROSCOPIC HYSTERECTOMY WITH SALPINGECTOMY Bilateral 04/23/2022   Procedure: TOTAL LAPAROSCOPIC HYSTERECTOMY BILATERAL SALPINGECTOMY, LEFT OOPHORECTOMY;  Surgeon: Jerene Bears, MD;  Location: [facility];  Service: Gynecology;  Laterality: Bilateral;   TRANSTHORACIC ECHOCARDIOGRAM  11/28/2021   see results in Epic   TRANSTHORACIC ECHOCARDIOGRAM  07/05/2020   in Epic   TUBAL LIGATION  02/2012   WISDOM TOOTH EXTRACTION  early 1990's   Patient Active Problem List   Diagnosis Date Noted   Genetic testing 07/04/2022   Family history of uterine cancer 06/24/2022   Local edema 02/25/2022   Hypothyroidism due to Hashimoto's thyroiditis 06/07/2021   Malignant neoplasm of lower-inner quadrant of left breast in female, estrogen receptor positive (HCC) 10/23/2020   Optic neuritis, left 10/18/2020   Myelin oligodendrocyte glycoprotein antibody disorder (MOGAD) 07/10/2020   Mitral regurgitation    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 It measures 6 mm in the lower inner quadrant. it is ER/PR positive and HER2 negaitve with a Ki67 of < 5%. 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 She is treated with prednisone which impacts fluid retention. She had left lumpectomy on 11/15/2020 with 0/3 lymph nodes removed. She says that she has a problem with passing out with medical things but can let us know if she feels this might happen.   PRECAUTIONS: left UE Lymphedema risk,  SUBJECTIVE: The sleeve has just been feeling uncomfortable and my rings have been fitting recently.    PAIN:  Are you having pain? No  SOZO SCREENING: Patient was assessed today using the SOZO machine to determine the lymphedema index score. Her last score was in the red around 1 year  ago and she was given a pump, compression day and night garments.  She recently noticed that her arm was improving and we decided to check her SOZO score for more information. This was compared to her baseline score. It was determined that she is within the recommended range when compared to her baseline and no further action is needed at this time. She will continue SOZO screenings. These are done every 3 months for 2 years post operatively followed by every 6 months for 2 years, and then annually.  She will trial one of her old circular knit sleeves and no hand piece and/or gauntlet and see how this goes.     Idamae Lusher, PT 10/06/2022, 8:30 PM

## 2022-10-13 ENCOUNTER — Ambulatory Visit: Payer: Commercial Managed Care - PPO | Admitting: Hematology and Oncology

## 2022-10-21 DIAGNOSIS — H9313 Tinnitus, bilateral: Secondary | ICD-10-CM | POA: Insufficient documentation

## 2022-10-22 ENCOUNTER — Encounter: Payer: Self-pay | Admitting: Neurology

## 2022-10-22 ENCOUNTER — Ambulatory Visit: Payer: Commercial Managed Care - PPO | Admitting: Neurology

## 2022-10-22 VITALS — BP 131/81 | HR 72 | Ht 65.5 in | Wt 180.0 lb

## 2022-10-22 DIAGNOSIS — G3781 Myelin oligodendrocyte glycoprotein antibody disease: Secondary | ICD-10-CM

## 2022-10-22 DIAGNOSIS — H469 Unspecified optic neuritis: Secondary | ICD-10-CM

## 2022-10-22 DIAGNOSIS — E559 Vitamin D deficiency, unspecified: Secondary | ICD-10-CM

## 2022-10-22 DIAGNOSIS — Z79899 Other long term (current) drug therapy: Secondary | ICD-10-CM | POA: Diagnosis not present

## 2022-10-22 NOTE — Progress Notes (Signed)
[facility]  PATIENT: Valerie [name] DOB: Sep 12, 1970  REFERRING DOCTOR OR PCP:  Dr. Redgie Grayer SOURCE: Patient, notes from recent hospitalization, laboratory results, MRI images personally reviewed.  _________________________________   HISTORICAL  CHIEF COMPLAINT:  Chief Complaint  Patient presents with   Follow-up    Rm 10, alone.  No concerns, stable.  Had bout of dizziness back in 08/2022 saw pcp, not wearing Lymphedema sleeve anymore.     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   She gets free drug through [place].  Vision is doing better now.     She saw ophthalmology Mora Appl) in March 2023 and was told VF is back to normal.   She will be seeing her again next week.   Vision improved but is not quite back to baseline.  She had three episodes of ON, the right December 2021 and the left 05/05/2020 and left January 2023 (while on Rituxan).   The left optic neuritis in 2020 was a worse episode with almost complete blindness.  Vision has improved near 20/20.   Colors are mildly desaturated OS and visual resolution seems worse OD.     She had an episode of vertigo May 2024 lasting one entire day.  She saw ENT and no pathology seen.  She notes if she looks around quickly she feels slight vertigo.       She has had some itching in the eyes .  She was placed on eye drops prn with benefit.      She has not had any spinal cord syndrome.  Sometimes she drops items but denies any significant weakness  She denies any change in her gait or balance now but had some issues in January.   She notes no difficulty with strength or sensation.  Bladder function is fine.  She notes occasionally brain fog.   She sleeps well.     She had calcifications on the left breast and had a biopsy.   She was diagnosed with left breast cancer summer 2022 - she  did a biopsy, followed by surgery and radiation.Marland Kitchen  Lymph nodes were clean at surgery.   She is on Tamoxifen  (will take x 5 years).    Last mammogram was fine.   She has lymphedema.  She had a hysterectomy 04/23/2022 .  She had squamous metaplasia - no actual cancer.        HISTORY MOGAD / ON She  had right >> left eye pain at the end of December 2021.  On 04/20/2020 she began to have reduced vision OD and photophobia.    She saw an ophthalmologist who felt she had dry eyes.   She saw Dr. Briscoe Burns the next day who diagnosed her with right optic neuritis.  She was referred to Dr. Allyne Gee who did OCT c/with optic neuritis.   She was admittted 04/24/2020 to 04/29/2020 and received 5 days IV Solu-Medrol.   Vision improved with the steroids.   Upon discharge, she still had reduced visual acuity but good color vision.   She had only mild eye aching and mild photophobia.    On 05/05/2020,  she had aching in the left eye followed by complete loss of vision.  Steroids helped.  I initially placed her on azathioprine but she had trouble tolerating it.  We were able to get her  on Rituxan.  She had another visual optic neuritis exacerbation early 2023 prompting switch from Rituxan to Actemra.  Around the time she started March 2023 she noted slight visual blurring and eye temporarily increase her prednisone.  She was able to completely come off of prednisone and May 2023.  Imaging studies: MRI of the orbits, and brain 04/24/2020 showed evidence of right optic neuritis.  The brain was otherwise normal.  MRI of the cervical and thoracic spine 04/24/2020 showed no demyelination.  She does have some mild degenerative changes in the cervical spine.  Data review: Imaging: MRI of the brain 04/24/2020 was normal.  MRI of the orbits 04/24/2020 showed abnormal signal with enhancement in the right optic nerve consistent with optic neuritis.  MRI of the cervical spine and MRI of the thoracic spine 04/24/2020 showed normal spinal  cord and some degenerative changes with moderate right foraminal narrowing at C5-C6.  Laboratory data: Labs 04/25/2020:   Anti-MOG antibodies were high titer positive (1: 320).  HIV was negative.   ANA/Sjogrn's antibodies were negative.   RPR was negative,  Anti-NMO Ab was negative,   HgbA1c was normal 5.4.  Sugars ranged 110-183.      REVIEW OF SYSTEMS: Constitutional: No fevers, chills, sweats, or change in appetite Eyes: No visual changes, double vision, eye pain Ear, nose and throat: No hearing loss, ear pain, nasal congestion, sore throat Cardiovascular: No chest pain, palpitations Respiratory:  No shortness of breath at rest or with exertion.   No wheezes GastrointestinaI: No nausea, vomiting, diarrhea, abdominal pain, fecal incontinence Genitourinary:  No dysuria, urinary retention or frequency.  No nocturia. Musculoskeletal:  No neck pain, back pain Integumentary: No rash, pruritus, skin lesions Neurological: as above Psychiatric: No depression at this time.  No anxiety Endocrine: No palpitations, diaphoresis, change in appetite, change in weigh or increased thirst Hematologic/Lymphatic:  No anemia, purpura, petechiae. Allergic/Immunologic: No itchy/runny eyes, nasal congestion, recent allergic reactions, rashes  ALLERGIES: No Known Allergies  HOME MEDICATIONS:  Current Outpatient Medications:    ascorbic acid (VITAMIN C) 1000 MG tablet, Take 1 tablet by mouth daily., Disp: , Rfl:    cholecalciferol (VITAMIN D3) 25 MCG (1000 UNIT) tablet, Take 5,000 Units by mouth daily., Disp: , Rfl:    furosemide (LASIX) 20 MG tablet, Take 1 tablet (20 mg total) by mouth daily. (Patient taking differently: Take 20 mg by mouth as needed.), Disp: 90 tablet, Rfl: 2   levothyroxine (SYNTHROID) 88 MCG tablet, Take 1 tablet (88 mcg total) by mouth daily before breakfast. 88 mcg six days a week, Disp: 60 tablet, Rfl: 0   MAGNESIUM GLUCONATE PO, Take 2 tablets by mouth daily at 12 noon., Disp: , Rfl:     meclizine (ANTIVERT) 25 MG tablet, Take 1 tablet (25 mg total) by mouth 3 (three) times daily as needed for dizziness., Disp: 30 tablet, Rfl: 0   omeprazole (PRILOSEC) 20 MG capsule, Take 1 capsule (20 mg total) by mouth daily. TAKE 1 CAPSULE BY MOUTH DAILY, Disp: 90 capsule, Rfl: 3   ondansetron (ZOFRAN-ODT) 4 MG disintegrating tablet, Dissolve 1 tablet under the tongue every 8 (eight) hours as needed for nausea or vomiting., Disp: 20 tablet, Rfl: 0   Probiotic Product (CULTRELLE KIDS IMMUNE DEFENSE PO), Take 1 tablet by mouth daily., Disp: , Rfl:    tamoxifen (NOLVADEX) 10 MG tablet, Take 1 tablet (10 mg total) by mouth daily. (Patient taking differently: Take 10 mg by mouth. 10 mg four times a week), Disp: 90 tablet,  Rfl: 3   Tocilizumab (ACTEMRA) 162 MG/0.9ML SOSY, Inject 0.9 mLs (162 mg total) into the skin once a week. For MOGAD Takes weekly on Saturday., Disp: 10.8 mL, Rfl: 3   UNABLE TO FIND, Take 4 tablets by mouth daily. Med Name: Omega Pure 2 tablet in AM and 1 tablet in PM, Disp: , Rfl:    UNABLE TO FIND, Take by mouth daily. Med Name: Ultra Inflamix 2 scoops daily, Disp: , Rfl:    UNABLE TO FIND, Take 2 tablets by mouth daily. Med Name: R-Lipoic Acid, Disp: , Rfl:   PAST MEDICAL HISTORY: Past Medical History:  Diagnosis Date   Abnormal echocardiogram 11/28/2021   EF 60 -65%, abnormal global longitudinal strain, see cardiology OV in Epic dated 12/25/21   Breast cancer (HCC)    Cancer (HCC) 09/2020   left breast, ER+, s/p radiation therapy 12/25/20 - 01/18/21, Patient follows with Dr. Serena Croissant @ [facility].   COVID-19 11/2019   flu-like symptoms, cough, no hospitalizations   Edema    lower extremity, follows w/ [facility], Sabino Dick, [place]. See OV dated 03/24/22 in Epic.   GERD (gastroesophageal reflux disease)    Follows w/ Dr. Judeen Hammans, gastroenterologist and PCP, Dr. Maryruth Hancock [facility] @ [facility].   Hashimoto's disease    Follows with Dr.  Ernest Haber @ [facility].   History of hiatal hernia    2 cm hiatal hernia per 01/08/22 EGD   History of radiation therapy 2022   completed 01-18-2021   [facility] (hard of hearing)    has gotten bilateral aides.   Hypothyroidism    Follows w/ endocronologist, Dr. Ernest Haber @ [place].   Lymphedema    left arm, pt uses lymphedema pump at night   Mitral regurgitation    mild by echo 06/2020   Myelin oligodendrocyte glycoprotein antibody disorder (MOGAD)    Follows with Dr. Despina Arias @ [facility].   Optic neuritis    Follows w/ opthamology, Dr. Mora Appl.03/2020 right eye (almost complete blindness), 04/2020 left eye   Personal history of radiation therapy    PONV (postoperative nausea and vomiting)    Status post dilation of esophageal narrowing 01/08/2022   EGD, esophagus dilated   Wears glasses     PAST SURGICAL HISTORY: Past Surgical History:  Procedure Laterality Date   ABDOMINAL HYSTERECTOMY  04/23/22   BIOPSY BREAST Left 10/17/2020   BREAST LUMPECTOMY     BREAST LUMPECTOMY WITH RADIOACTIVE SEED AND SENTINEL LYMPH NODE BIOPSY Left 11/15/2020   Procedure: LEFT BREAST LUMPECTOMY WITH RADIOACTIVE SEED AND LEFT SENTINEL LYMPH NODE BIOPSY;  Surgeon: Harriette Bouillon, MD;  Location: [facility];  Service: General;  Laterality: Left;   BREAST SURGERY  August 2022   Cancer surgery   COLONOSCOPY  09/06/2021   cecal polyp   CT CORONARY CA SCORING  07/13/2020   Coronary Calcium = 0 (in Epic)   CYSTOSCOPY N/A 04/23/2022   Procedure: CYSTOSCOPY;  Surgeon: Jerene Bears, MD;  [facility];  Service: Gynecology;  Laterality: N/A;   ESOPHAGOGASTRODUODENOSCOPY  01/08/2022   2cm hiatal hernia, esophagus dilated   TOTAL LAPAROSCOPIC HYSTERECTOMY WITH SALPINGECTOMY Bilateral 04/23/2022   Procedure: TOTAL LAPAROSCOPIC HYSTERECTOMY BILATERAL SALPINGECTOMY, LEFT OOPHORECTOMY;  Surgeon: Jerene Bears, MD;  Location: [facility];  Service: Gynecology;  Laterality: Bilateral;   TRANSTHORACIC ECHOCARDIOGRAM  11/28/2021   see results in Epic   TRANSTHORACIC ECHOCARDIOGRAM  07/05/2020   in Epic  TUBAL LIGATION  02/2012   WISDOM TOOTH EXTRACTION     early 1990's    FAMILY HISTORY: Family History  Problem Relation Age of Onset   Hearing loss Mother    Thyroid disease Mother    Cancer Mother 32       endometrial cancer, lynch negative   GER disease Mother    Other Mother        uterine fibroids, optic disc abnormality    Eczema Mother    Colon polyps Mother    Hypothyroidism Mother    Osteoarthritis Mother    Hypertension Mother    Migraines Mother    Colon polyps Father    Hearing loss Father    Other Father        Colon adenoma   Hyperlipidemia Father    GER disease Father    Heart murmur Father    Amblyopia Father        Right eye   Neuropathy Father    Glaucoma Maternal Grandmother    Macular degeneration Maternal Grandmother    Arthritis Maternal Grandmother    Multiple sclerosis Paternal Grandmother    Arthritis Maternal Grandfather    Heart disease Paternal Grandfather    Diabetes Maternal Aunt    Breast cancer Neg Hx    Colon cancer Neg Hx    Esophageal cancer Neg Hx    Rectal cancer Neg Hx    Stomach cancer Neg Hx     SOCIAL HISTORY:  Social History   Socioeconomic History   Marital status: Married    Spouse name: Not on file   Number of children: 2   Years of education: BS   Highest education level: Not on file  Occupational History   Occupation: Psychologist, occupational   Occupation: Shipping and receiving clerk  Tobacco Use   Smoking status: Never   Smokeless tobacco: Not on file  Vaping Use   Vaping Use: Never used  Substance and Sexual Activity   Alcohol use: Never   Drug use: Never   Sexual activity: Yes    Birth control/protection: Surgical, None    Comment: hysterectomy  Other Topics Concern   Not on file  Social History Narrative   Right handed     Caffeine use: rare   Social Determinants of Health   Financial Resource Strain: Low Risk  (03/19/2021)   Overall Financial Resource Strain (CARDIA)    Difficulty of Paying Living Expenses: Not hard at all  Food Insecurity: No Food Insecurity (03/19/2021)   Hunger Vital Sign    Worried About Running Out of Food in the Last Year: Never true    Ran Out of Food in the Last Year: Never true  Transportation Needs: No Transportation Needs (03/19/2021)   PRAPARE - Administrator, Civil Service (Medical): No    Lack of Transportation (Non-Medical): No  Physical Activity: Inactive (03/19/2021)   Exercise Vital Sign    Days of Exercise per Week: 0 days    Minutes of Exercise per Session: 0 min  Stress: No Stress Concern Present (03/19/2021)   [employer] of Occupational Health - Occupational Stress Questionnaire    Feeling of Stress : Not at all  Social Connections: Socially Integrated (03/19/2021)   Social Connection and Isolation Panel [NHANES]    Frequency of Communication with Friends and Family: More than three times a week    Frequency of Social Gatherings with Friends and Family: Three times a week    Attends Religious Services:  More than 4 times per year    Active Member of Clubs or Organizations: Yes    Attends Club or Organization Meetings: 1 to 4 times per year    Marital Status: Married  Catering manager Violence: Not At Risk (03/19/2021)   Humiliation, Afraid, Rape, and Kick questionnaire    Fear of Current or Ex-Partner: No    Emotionally Abused: No    Physically Abused: No    Sexually Abused: No     PHYSICAL EXAM  Vitals:   10/22/22 0805  BP: 131/81  Pulse: 72  Weight: 180 lb (81.6 kg)  Height: 5' 5.5" (1.664 m)    Body mass index is 29.5 kg/m.  VA:   20/20 OS and 20/20-1 OD   Colors slightly desaturated OD   General: The patient is well-developed and well-nourished and in no acute distress  HEENT:  Head is [place]/AT.  Sclera are anicteric.     Skin: She has lymphedema in the left arm.   Neurologic Exam  Mental status: The patient is alert and oriented x 3 at the time of the examination. The patient has apparent normal recent and remote memory, with an apparently normal attention span and concentration ability.   Speech is normal.  Cranial nerves: Extraocular movements are full.   She can move her eyes without pain.  She has a 1+ left APD.  Color vision is desaturated slightly OD.  Facial strength and sensation was normal.  Hearing was symmetric.  Motor:  Muscle bulk is normal.   Tone is normal. Strength is  5 / 5 in all 4 extremities.   Sensory: Sensory testing is intact to pinprick, soft touch and vibration sensation in all 4 extremities.  Coordination: Cerebellar testing reveals good finger-nose-finger and heel-to-shin bilaterally.  Gait and station: Station is normal.   Gait is normal Tandem gait is slightly wide.  Romberg negative.  Reflexes: Deep tendon reflexes are symmetric and normal bilaterally.     DIAGNOSTIC DATA (LABS, IMAGING, TESTING) - I reviewed patient records, labs, notes, testing and imaging myself where available.  Lab Results  Component Value Date   WBC 5.5 08/29/2022   HGB 14.7 08/29/2022   HCT 44.2 08/29/2022   MCV 97.8 08/29/2022   PLT 182 08/29/2022      Component Value Date/Time   NA 143 08/29/2022 0945   NA 144 10/21/2021 0808   K 4.4 08/29/2022 0945   CL 110 08/29/2022 0945   CO2 28 08/29/2022 0945   GLUCOSE 100 (H) 08/29/2022 0945   BUN 18 08/29/2022 0945   BUN 14 10/21/2021 0808   CREATININE 0.83 08/29/2022 0945   CREATININE 0.88 11/08/2021 1016   CALCIUM 8.6 (L) 08/29/2022 0945   PROT 6.1 (L) 08/29/2022 0945   PROT 6.1 10/21/2021 0808   ALBUMIN 3.8 08/29/2022 0945   ALBUMIN 3.9 10/21/2021 0808   AST 19 08/29/2022 0945   AST 20 11/08/2021 1016   ALT 27 08/29/2022 0945   ALT 26 11/08/2021 1016   ALKPHOS 35 (L) 08/29/2022 0945   BILITOT 0.4 08/29/2022 0945   BILITOT 0.4  11/08/2021 1016   GFRNONAA >60 08/29/2022 0945   GFRNONAA >60 11/08/2021 1016   Lab Results  Component Value Date   CHOL 155 08/19/2022   HDL 49.20 08/19/2022   LDLCALC 80 08/19/2022   TRIG 129.0 08/19/2022   CHOLHDL 3 08/19/2022   Lab Results  Component Value Date   HGBA1C 5.4 08/19/2022       ASSESSMENT AND PLAN  Vitamin D deficiency - Plan: VITAMIN D 25 Hydroxy (Vit-D Deficiency, Fractures)  Myelin oligodendrocyte glycoprotein antibody disorder (MOGAD) - Plan: CBC with Differential/Platelet, Comprehensive metabolic panel, Lipid panel  High risk medication use - Plan: CBC with Differential/Platelet, Comprehensive metabolic panel, Lipid panel   1.   Continue Actemra.  She will come in fasting next visit and we will check CBC with differential, CMP and lipid panel next week   2.   Stay active.  Continue Vit D 3.   Follow-up with Dr. Pamelia Hoit for breast cancer.   4.   rtc 6 months    Eashan Schipani A. Epimenio Foot, MD, [facility] 10/22/2022, 8:48 AM Certified in Neurology, Clinical Neurophysiology, Sleep Medicine and Neuroimaging  [facility] [address] [place], [place] [address] [phone]

## 2022-10-23 LAB — COMPREHENSIVE METABOLIC PANEL
ALT: 28 IU/L (ref 0–32)
AST: 21 IU/L (ref 0–40)
Albumin: 4 g/dL (ref 3.8–4.9)
Alkaline Phosphatase: 44 IU/L (ref 44–121)
BUN/Creatinine Ratio: 17 (ref 9–23)
BUN: 16 mg/dL (ref 6–24)
Bilirubin Total: 0.3 mg/dL (ref 0.0–1.2)
CO2: 24 mmol/L (ref 20–29)
Calcium: 8.6 mg/dL — ABNORMAL LOW (ref 8.7–10.2)
Chloride: 107 mmol/L — ABNORMAL HIGH (ref 96–106)
Creatinine, Ser: 0.92 mg/dL (ref 0.57–1.00)
Globulin, Total: 2.1 g/dL (ref 1.5–4.5)
Glucose: 90 mg/dL (ref 70–99)
Potassium: 4.4 mmol/L (ref 3.5–5.2)
Sodium: 142 mmol/L (ref 134–144)
Total Protein: 6.1 g/dL (ref 6.0–8.5)
eGFR: 74 mL/min/{1.73_m2} (ref >59)

## 2022-10-23 LAB — CBC WITH DIFFERENTIAL/PLATELET
Basophils Absolute: 0 10*3/uL (ref 0.0–0.2)
Basos: 1 %
EOS (ABSOLUTE): 0.4 10*3/uL (ref 0.0–0.4)
Eos: 8 %
Hematocrit: 44.1 % (ref 34.0–46.6)
Hemoglobin: 14.7 g/dL (ref 11.1–15.9)
Immature Grans (Abs): 0 10*3/uL (ref 0.0–0.1)
Immature Granulocytes: 0 %
Lymphocytes Absolute: 1.4 10*3/uL (ref 0.7–3.1)
Lymphs: 31 %
MCH: 32.7 pg (ref 26.6–33.0)
MCHC: 33.3 g/dL (ref 31.5–35.7)
MCV: 98 fL — ABNORMAL HIGH (ref 79–97)
Monocytes Absolute: 0.7 10*3/uL (ref 0.1–0.9)
Monocytes: 14 %
Neutrophils Absolute: 2.2 10*3/uL (ref 1.4–7.0)
Neutrophils: 46 %
Platelets: 174 10*3/uL (ref 150–450)
RBC: 4.49 x10E6/uL (ref 3.77–5.28)
RDW: 11.8 % (ref 11.7–15.4)
WBC: 4.7 10*3/uL (ref 3.4–10.8)

## 2022-10-23 LAB — LIPID PANEL
Chol/HDL Ratio: 3.2 ratio (ref 0.0–4.4)
Cholesterol, Total: 178 mg/dL (ref 100–199)
HDL: 56 mg/dL (ref >39)
LDL Chol Calc (NIH): 105 mg/dL — ABNORMAL HIGH (ref 0–99)
Triglycerides: 95 mg/dL (ref 0–149)
VLDL Cholesterol Cal: 17 mg/dL (ref 5–40)

## 2022-10-23 LAB — VITAMIN D 25 HYDROXY (VIT D DEFICIENCY, FRACTURES): Vit D, 25-Hydroxy: 72.1 ng/mL (ref 30.0–100.0)

## 2022-10-29 ENCOUNTER — Ambulatory Visit (HOSPITAL_BASED_OUTPATIENT_CLINIC_OR_DEPARTMENT_OTHER): Payer: Commercial Managed Care - PPO | Admitting: Obstetrics & Gynecology

## 2022-11-06 ENCOUNTER — Encounter: Payer: Self-pay | Admitting: Hematology and Oncology

## 2022-11-11 ENCOUNTER — Encounter: Payer: Self-pay | Admitting: Pathology

## 2022-11-21 ENCOUNTER — Encounter (HOSPITAL_BASED_OUTPATIENT_CLINIC_OR_DEPARTMENT_OTHER): Payer: Self-pay | Admitting: Obstetrics & Gynecology

## 2022-11-24 ENCOUNTER — Encounter: Payer: Self-pay | Admitting: Hematology and Oncology

## 2022-11-26 ENCOUNTER — Encounter: Payer: Self-pay | Admitting: Neurology

## 2022-11-26 ENCOUNTER — Other Ambulatory Visit (HOSPITAL_BASED_OUTPATIENT_CLINIC_OR_DEPARTMENT_OTHER): Payer: Self-pay | Admitting: Obstetrics & Gynecology

## 2022-11-26 DIAGNOSIS — Z8742 Personal history of other diseases of the female genital tract: Secondary | ICD-10-CM

## 2022-11-26 DIAGNOSIS — Z90710 Acquired absence of both cervix and uterus: Secondary | ICD-10-CM

## 2022-11-26 DIAGNOSIS — R102 Pelvic and perineal pain: Secondary | ICD-10-CM

## 2022-11-27 ENCOUNTER — Encounter: Payer: Self-pay | Admitting: Family Medicine

## 2022-11-28 ENCOUNTER — Encounter: Payer: Self-pay | Admitting: Adult Health

## 2022-11-30 ENCOUNTER — Encounter: Payer: Self-pay | Admitting: Neurology

## 2022-11-30 ENCOUNTER — Ambulatory Visit (HOSPITAL_BASED_OUTPATIENT_CLINIC_OR_DEPARTMENT_OTHER)
Admission: RE | Admit: 2022-11-30 | Discharge: 2022-11-30 | Disposition: A | Discharge status: Home / Self Care | Payer: Commercial Managed Care - PPO | Source: Ambulatory Visit | Attending: Obstetrics & Gynecology | Admitting: Obstetrics & Gynecology

## 2022-11-30 DIAGNOSIS — R102 Pelvic and perineal pain: Secondary | ICD-10-CM | POA: Diagnosis not present

## 2022-11-30 DIAGNOSIS — Z90710 Acquired absence of both cervix and uterus: Secondary | ICD-10-CM | POA: Diagnosis present

## 2022-11-30 DIAGNOSIS — Z8742 Personal history of other diseases of the female genital tract: Secondary | ICD-10-CM | POA: Insufficient documentation

## 2022-12-01 ENCOUNTER — Inpatient Hospital Stay: Payer: Commercial Managed Care - PPO | Attending: Adult Health | Admitting: Adult Health

## 2022-12-01 ENCOUNTER — Encounter: Payer: Self-pay | Admitting: Adult Health

## 2022-12-01 DIAGNOSIS — Z170 Estrogen receptor positive status [ER+]: Secondary | ICD-10-CM | POA: Diagnosis not present

## 2022-12-01 DIAGNOSIS — C50312 Malignant neoplasm of lower-inner quadrant of left female breast: Secondary | ICD-10-CM | POA: Insufficient documentation

## 2022-12-01 NOTE — Progress Notes (Signed)
[facility] Cancer Follow up:    Valerie Sow, MD [address] [place] [address]   DIAGNOSIS:  Cancer Staging  Malignant neoplasm of lower-inner quadrant of left breast in female, estrogen receptor positive (HCC) Staging form: Breast, AJCC 8th Edition - Clinical stage from 10/24/2020: Stage IA (cT1b, cN0, cM0, G1, ER+, PR+, HER2-) - Signed by Serena Croissant, MD on 10/24/2020 Stage prefix: Initial diagnosis Histologic grading system: 3 grade system Laterality: Left Staged by: Pathologist and managing physician Stage used in treatment planning: Yes National guidelines used in treatment planning: Yes Type of national guideline used in treatment planning: NCCN  I connected with Valerie [name] on 12/01/22 at 11:45 AM EDT by telephone and verified that I am speaking with the correct person using two identifiers.  I discussed the limitations, risks, security and privacy concerns of performing an evaluation and management service by telephone and the availability of in person appointments.  I also discussed with the patient that there may be a patient responsible charge related to this service. The patient expressed understanding and agreed to proceed.   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  SUMMARY OF ONCOLOGIC HISTORY: Oncology History  Malignant neoplasm of lower-inner quadrant of left breast in female, estrogen receptor positive (HCC)  10/23/2020 Initial Diagnosis   Screening mammogram on 09/11/20 showed possible distortion with calcifications in the left breast. Diagnostic mammogram and [place] on 10/03/20 showed 0.6 cm group of indeterminate lower inner left breast calcifications with possible associated subtle distortion and no abnormal appearing left axillary lymph nodes. Biopsy on 10/17/20 showed invasive ductal carcinoma, DCIS with calcifications, ER+(90%)/PR(95%)/Ki67(<5%).   10/24/2020 Cancer Staging   Staging form: Breast, AJCC 8th  Edition - Clinical stage from 10/24/2020: Stage IA (cT1b, cN0, cM0, G1, ER+, PR+, HER2-) - Signed by Serena Croissant, MD on 10/24/2020 Stage prefix: Initial diagnosis Histologic grading system: 3 grade system Laterality: Left Staged by: Pathologist and managing physician Stage used in treatment planning: Yes National guidelines used in treatment planning: Yes Type of national guideline used in treatment planning: NCCN   11/15/2020 Surgery   Left lumpectomy: No residual cancer, fibrocystic change, margins negative, 0/3 lymph nodes negative (based on the biopsy tumor size: 2 mm), previous ER 90%, PR 95%, HER2 negative, Ki-67 less than 5%   12/25/2020 - 01/18/2021 Radiation Therapy   Adjuvant radiation   02/21/2021 -  Anti-estrogen oral therapy   Tamoxifen    Genetic Testing   [facility] Multi-Cancer Panel+RNA was Negative. Report date is 07/01/2022.  The Multi-Cancer + RNA Panel offered by [facility] includes sequencing and/or deletion/duplication analysis of the following 70 genes:  AIP*, ALK, APC*, ATM*, AXIN2*, BAP1*, BARD1*, BLM*, BMPR1A*, BRCA1*, BRCA2*, BRIP1*, CDC73*, CDH1*, CDK4, CDKN1B*, CDKN2A, CHEK2*, CTNNA1*, DICER1*, EPCAM (del/dup only), EGFR, FH*, FLCN*, GREM1 (promoter dup only), HOXB13, KIT, LZTR1, MAX*, MBD4, MEN1*, MET, MITF, MLH1*, MSH2*, MSH3*, MSH6*, MUTYH*, NF1*, NF2*, NTHL1*, PALB2*, PDGFRA, PMS2*, POLD1*, POLE*, POT1*, PRKAR1A*, PTCH1*, PTEN*, RAD51C*, RAD51D*, RB1*, RET, SDHA* (sequencing only), SDHAF2*, SDHB*, SDHC*, SDHD*, SMAD4*, SMARCA4*, SMARCB1*, SMARCE1*, STK11*, SUFU*, TMEM127*, TP53*, TSC1*, TSC2*, VHL*. RNA analysis is performed for * genes.     CURRENT THERAPY: Tamoxifen  INTERVAL HISTORY: Valerie [name] 52 y.o. female returns for f/u of her history of breast cancer.  She is taking Tamoxifen daily.  She started at 20mg  daily and struggled with the full dose.  She continued to struggle with side effects and was weaned down to 10mg  daily, then ultimately 10mg   every other day.  For hot flashes she was taking 10mg  every other evening.    She has history of lymphedema, and since stopping wearing her flexitouch and glove/sleeve her arm has improved.     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  MEDICAL HISTORY: Past Medical History:  Diagnosis Date   Abnormal echocardiogram 11/28/2021   EF 60 -65%, abnormal global longitudinal strain, see cardiology OV in Epic dated 12/25/21   Breast cancer (HCC)    Cancer (HCC) 09/2020   left breast, ER+, s/p radiation therapy 12/25/20 - 01/18/21, Patient follows with Dr. Serena Croissant @ [facility].   COVID-19 11/2019   flu-like symptoms, cough, no hospitalizations   Edema    lower extremity, follows w/ [facility], Sabino Dick, [place]. See OV dated 03/24/22 in Epic.   GERD (gastroesophageal reflux disease)    Follows w/ Dr. Judeen Hammans, gastroenterologist and PCP, Dr. Maryruth Hancock [facility] @ [facility].   Hashimoto's disease    Follows with Dr. Ernest Haber @ [place].   History of hiatal hernia    2 cm hiatal hernia per 01/08/22 EGD   History of radiation therapy 2022   completed 01-18-2021   [facility] (hard of hearing)    has gotten bilateral aides.   Hypothyroidism    Follows w/ endocronologist, Dr. Ernest Haber @ [place].   Lymphedema    left arm, pt uses lymphedema pump at night   Mitral regurgitation    mild by echo 06/2020   Myelin  oligodendrocyte glycoprotein antibody disorder (MOGAD)    Follows with Dr. Despina Arias @ [facility].   Optic neuritis    Follows w/ opthamology, Dr. Mora Appl.03/2020 right eye (almost complete blindness), 04/2020 left eye   Personal history of radiation therapy    PONV (postoperative nausea and vomiting)    Status post dilation of esophageal narrowing 01/08/2022   EGD, esophagus dilated   Wears glasses     SURGICAL HISTORY: Past Surgical History:  Procedure Laterality Date   ABDOMINAL HYSTERECTOMY  04/23/22   BIOPSY BREAST Left 10/17/2020   BREAST LUMPECTOMY     BREAST LUMPECTOMY WITH RADIOACTIVE SEED AND SENTINEL LYMPH NODE BIOPSY Left 11/15/2020   Procedure: LEFT BREAST LUMPECTOMY WITH RADIOACTIVE SEED AND LEFT SENTINEL LYMPH NODE BIOPSY;  Surgeon: Harriette Bouillon, MD;  Location: [facility];  Service: General;  Laterality: Left;   BREAST SURGERY  August 2022   Cancer surgery   COLONOSCOPY  09/06/2021   cecal polyp   CT CORONARY CA SCORING  07/13/2020   Coronary Calcium = 0 (in Epic)   CYSTOSCOPY N/A 04/23/2022   Procedure: CYSTOSCOPY;  Surgeon: Jerene Bears, MD;  Location: [facility];  Service: Gynecology;  Laterality: N/A;   ESOPHAGOGASTRODUODENOSCOPY  01/08/2022   2cm hiatal hernia, esophagus dilated   TOTAL LAPAROSCOPIC HYSTERECTOMY WITH SALPINGECTOMY Bilateral 04/23/2022   Procedure: TOTAL LAPAROSCOPIC HYSTERECTOMY BILATERAL SALPINGECTOMY, LEFT OOPHORECTOMY;  Surgeon: Jerene Bears, MD;  Location:  [facility];  Service: Gynecology;  Laterality: Bilateral;   TRANSTHORACIC ECHOCARDIOGRAM  11/28/2021   see results in Epic   TRANSTHORACIC ECHOCARDIOGRAM  07/05/2020   in Epic   TUBAL LIGATION  02/2012   WISDOM TOOTH EXTRACTION     early 1990's    SOCIAL HISTORY: Social History   Socioeconomic History   Marital status: Married    Spouse name: Not on file   Number of children: 2   Years of education: BS    Highest education level: Not on file  Occupational History   Occupation: Psychologist, occupational   Occupation: Shipping and receiving clerk  Tobacco Use   Smoking status: Never   Smokeless tobacco: Not on file  Vaping Use   Vaping status: Never Used  Substance and Sexual Activity   Alcohol use: Never   Drug use: Never   Sexual activity: Yes    Birth control/protection: Surgical, None    Comment: hysterectomy  Other Topics Concern   Not on file  Social History Narrative   Right handed    Caffeine use: rare   Social Determinants of Health   Financial Resource Strain: Low Risk  (03/19/2021)   Overall Financial Resource Strain (CARDIA)    Difficulty of Paying Living Expenses: Not hard at all  Food Insecurity: Low Risk  (10/21/2022)   [facility]   Food vital sign    Within the past 12 months, you worried that your food would run out before you got money to buy more: Never true    Within the past 12 months, the food you bought just didn't last and you didn't have money to get more. : Never true  Transportation Needs: No Transportation Needs (03/19/2021)   PRAPARE - Administrator, Civil Service (Medical): No    Lack of Transportation (Non-Medical): No  Physical Activity: Inactive (03/19/2021)   Exercise Vital Sign    Days of Exercise per Week: 0 days    Minutes of Exercise per Session: 0 min  Stress: No Stress Concern Present (03/19/2021)   [employer] of Occupational Health - Occupational Stress Questionnaire    Feeling of Stress : Not at all  Social Connections: Socially Integrated (03/19/2021)   Social Connection and Isolation Panel [NHANES]    Frequency of Communication with Friends and Family: More than three times a week    Frequency of Social Gatherings with Friends and Family: Three times a week    Attends Religious Services: More than 4 times per year    Active Member of Clubs or Organizations: Yes    Attends Banker  Meetings: 1 to 4 times per year    Marital Status: Married  Catering manager Violence: Not At Risk (03/19/2021)   Humiliation, Afraid, Rape, and Kick questionnaire    Fear of Current or Ex-Partner: No    Emotionally Abused: No    Physically Abused: No    Sexually Abused: No    FAMILY HISTORY: Family History  Problem Relation Age of Onset   Hearing loss Mother    Thyroid disease Mother    Cancer Mother 70       endometrial cancer, lynch negative   GER disease Mother    Other Mother        uterine fibroids, optic disc abnormality    Eczema Mother    Colon polyps Mother    Hypothyroidism Mother    Osteoarthritis Mother    Hypertension Mother  Migraines Mother    Colon polyps Father    Hearing loss Father    Other Father        Colon adenoma   Hyperlipidemia Father    GER disease Father    Heart murmur Father    Amblyopia Father        Right eye   Neuropathy Father    Glaucoma Maternal Grandmother    Macular degeneration Maternal Grandmother    Arthritis Maternal Grandmother    Multiple sclerosis Paternal Grandmother    Arthritis Maternal Grandfather    Heart disease Paternal Grandfather    Diabetes Maternal Aunt    Breast cancer Neg Hx    Colon cancer Neg Hx    Esophageal cancer Neg Hx    Rectal cancer Neg Hx    Stomach cancer Neg Hx     Review of Systems  Constitutional:  Positive for fatigue. Negative for appetite change, chills, fever and unexpected weight change.  HENT:   Negative for hearing loss, lump/mass and trouble swallowing.   Eyes:  Negative for eye problems and icterus.  Respiratory:  Negative for chest tightness, cough and shortness of breath.   Cardiovascular:  Negative for chest pain, leg swelling and palpitations.  Gastrointestinal:  Negative for abdominal distention, abdominal pain, constipation, diarrhea, nausea and vomiting.  Endocrine: Negative for hot flashes.  Genitourinary:  Negative for difficulty urinating.   Musculoskeletal:   Negative for arthralgias.  Skin:  Negative for itching and rash.  Neurological:  Negative for dizziness, extremity weakness, headaches and numbness.  Hematological:  Negative for adenopathy. Does not bruise/bleed easily.  Psychiatric/Behavioral:  Negative for depression. The patient is not nervous/anxious.       PHYSICAL EXAMINATION  Patient appears well, she is in no apparent distress, mood and behavior are normal, speech is normal, breathing is non labored, skin visualized without rash or lesion.    ASSESSMENT and THERAPY PLAN:   Malignant neoplasm of lower-inner quadrant of left breast in female, estrogen receptor positive (HCC) 10/23/2020 screening mammogram on 09/11/20 showed possible distortion with calcifications in the left breast. Diagnostic mammogram and [place] on 10/03/20 showed 0.6 cm group of indeterminate lower inner left breast calcifications with possible associated subtle distortion and no abnormal appearing left axillary lymph nodes. Biopsy on 10/17/20 showed invasive ductal carcinoma, DCIS with calcifications, ER+(90%)/PR(95%)/Ki67(<5%).   11/15/2020:Left lumpectomy: No residual cancer, fibrocystic change, margins negative, 0/3 lymph nodes negative (based on the biopsy tumor size: 2 mm), previous ER 90%, PR 95%, HER2 negative, Ki-67 less than 5%   Adjuvant radiation: 12/25/2020-01/18/2021   Treatment plan: Adjuvant antiestrogen therapy with tamoxifen 10 mg daily x5 yrs  She took 2 years of tamoxifen however due to side effects has decided to stop because the risks of being on the medication have created additional health risks including being tired, weight concerns, and difficulty in living a healthy lifestyle.  She understands her risk of recurrence will be slightly higher with stopping tamoxifen early however her tumor was 2 mm in size and we discussed ways to optimize her health.  I reviewed with Valerie [name] that estrogen is made from fat cells and adrenal glands in addition to her  ovaries.  I recommended she focus more on increasing her plant-based foods and incorporating exercise into her daily lifestyle.  Her a handout on plant-based dietary recommendations.  She verbalized understanding of this.  She will follow-up with Dr. Pamelia Hoit in January 2025.  Her most recent mammogram was normal and she will repeat mammograms  annually.  Follow up instructions:    -Return to cancer center in 04/2023 for f/u with Dr. Pamelia Hoit   The patient was provided an opportunity to ask questions and all were answered. The patient agreed with the plan and demonstrated an understanding of the instructions.   The patient was advised to call back or seek an in-person evaluation if the symptoms worsen or if the condition fails to improve as anticipated.   I provided 30 minutes of face-to-face video visit time during this encounter, and > 50% was spent counseling as documented under my assessment & plan.   Lillard Anes, NP 12/01/22 2:17 PM Medical Oncology and Hematology [facility] [address] [place], [place] [address] Tel. [phone]    Fax. [phone]  *Total Encounter Time as defined by the Centers for Medicare and Medicaid Services includes, in addition to the face-to-face time of a patient visit (documented in the note above) non-face-to-face time: obtaining and reviewing outside history, ordering and reviewing medications, tests or procedures, care coordination (communications with other health care professionals or caregivers) and documentation in the medical record.

## 2022-12-01 NOTE — Assessment & Plan Note (Signed)
10/23/2020 screening mammogram on 09/11/20 showed possible distortion with calcifications in the left breast. Diagnostic mammogram and [place] on 10/03/20 showed 0.6 cm group of indeterminate lower inner left breast calcifications with possible associated subtle distortion and no abnormal appearing left axillary lymph nodes. Biopsy on 10/17/20 showed invasive ductal carcinoma, DCIS with calcifications, ER+(90%)/PR(95%)/Ki67(<5%).   11/15/2020:Left lumpectomy: No residual cancer, fibrocystic change, margins negative, 0/3 lymph nodes negative (based on the biopsy tumor size: 2 mm), previous ER 90%, PR 95%, HER2 negative, Ki-67 less than 5%   Adjuvant radiation: 12/25/2020-01/18/2021   Treatment plan: Adjuvant antiestrogen therapy with tamoxifen 10 mg daily x5 yrs  She took 2 years of tamoxifen however due to side effects has decided to stop because the risks of being on the medication have created additional health risks including being tired, weight concerns, and difficulty in living a healthy lifestyle.  She understands her risk of recurrence will be slightly higher with stopping tamoxifen early however her tumor was 2 mm in size and we discussed ways to optimize her health.  I reviewed with Nuvia that estrogen is made from fat cells and adrenal glands in addition to her ovaries.  I recommended she focus more on increasing her plant-based foods and incorporating exercise into her daily lifestyle.  Her a handout on plant-based dietary recommendations.  She verbalized understanding of this.  She will follow-up with Dr. Pamelia Hoit in January 2025.  Her most recent mammogram was normal and she will repeat mammograms annually.

## 2022-12-08 ENCOUNTER — Ambulatory Visit (INDEPENDENT_AMBULATORY_CARE_PROVIDER_SITE_OTHER): Payer: Commercial Managed Care - PPO | Admitting: Internal Medicine

## 2022-12-08 ENCOUNTER — Encounter: Payer: Self-pay | Admitting: Internal Medicine

## 2022-12-08 VITALS — BP 120/90 | HR 82 | Ht 65.5 in | Wt 184.4 lb

## 2022-12-08 DIAGNOSIS — E063 Autoimmune thyroiditis: Secondary | ICD-10-CM | POA: Diagnosis not present

## 2022-12-08 DIAGNOSIS — E038 Other specified hypothyroidism: Secondary | ICD-10-CM | POA: Diagnosis not present

## 2022-12-08 NOTE — Progress Notes (Unsigned)
Patient ID: Kamden Pooler, female   DOB: [birth year], 52 y.o.   MRN: [id number]  HPI  Shahera Oveson is a 52 y.o.-year-old female, returning for follow-up for Hashimoto's hypothyroidism.  Last visit 1 year ago. She previously saw Dr. Sharl Ma.  Interim history: She reduced the dose and then ended up stopping tamoxifen since last OV (1 week ago) b/c hot flushes, weight gain, ovarian cysts.  She was in the ED with vertigo 2/2 allergies earlier this year.  This resolved.  Reviewed and addended history: Pt. has been dx with hypothyroidism in 1996 during infertility evaluation (while living in [place]). She had 2 successful pregnancies since then, after starting on thyroid hormone therapy.   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  She sees Dr. Epimenio Foot at [facility] neurology.  She tried rituximab and azathioprine, but these did not work.  She was on steroids higher doses, and now on Actemra.  When I first saw her in 05/2021, she was not taking levothyroxine correctly.  After she started to take it correctly, we had to gradually reduce the dose.  In 11/2021, we decreased LT4 dose to 88 mcg 6/7 days (~75 mcg daily).  In 01/2022, we increased LT4 dose back to 88 mcg daily.  In 05/2022, we decreased LT4 dose back to 88 mcg 6/7 days.  She is on Levothyroxine 88 mcg 6/7 days (latest dose decrease 05/2022): - fasting, at 5 am - prev. along with tamoxifen >> off now - with water - separated by 2h from b'fast  - no iron - + PPIs (at 9 am - Omeprazole) - + Ultra Inflamix  - 75 mcg Biotin - + Hormone Balance multivitamins  - with 150 mcg Biotin  - 1-1.5h later- per [facility] >> we moved this later in the day in 05/2021. - ta I reviewed pt's thyroid tests: Lab Results  Component Value Date   TSH 1.664 08/29/2022   TSH 0.64 07/17/2022   TSH 0.15 (L) 06/09/2022   TSH 0.90 03/04/2022   TSH 8.75 (H) 01/22/2022   TSH 0.32 (L)  12/05/2021   TSH 0.51 10/16/2021   TSH 0.08 (L) 08/29/2021   TSH 0.14 (L) 07/26/2021   TSH 8.470 (H) 06/11/2021   FREET4 1.07 07/17/2022   FREET4 1.16 06/09/2022   FREET4 1.07 03/04/2022   FREET4 0.77 01/22/2022   FREET4 0.99 12/05/2021   FREET4 1.31 10/16/2021   FREET4 1.20 08/29/2021   FREET4 1.29 07/26/2021   FREET4 1.39 06/11/2021   T3FREE 2.3 06/11/2021   Previously: 10/08/2020: TSH 1.48 08/24/2020: TSH 7.5 07/04/2020: TSH 16.47 10/28/2019: TSH 2.59 08/11/2019: TSH 1.49 06/13/2019: TSH 0.16 (0.34-4.5)  2019-06-13     FT4 1.22   0.61-1.12  TSH   2019-01-05    TSH 1.26   0.34-4.50  TSH   2018-10-22    TSH 7.25   0.34-4.50  TSH   2018-09-08    TSH 22.28   0.34-4.50  Free T4   2018-09-08    FT4 0.63   0.61-1.12  T3, Free   2018-09-08    FT3 2.25   2.50-3.90  TSH   2018-07-21    TSH 2.81   0.34-4.50  TSH   2018-06-07    TSH 5.88   0.34-4.50  Free T4   2018-06-07    FT4 1.06   0.61-1.12  T3, Free   2018-06-07    FT3 2.60   2.50-3.90   Thyroid Panel With TSH (409811)   2018-06-07  Thyroxine (T4) 8.4   4.5-12.0  T3 Uptake 31   24-39  Free Thyroxine Index 2.6   1.2-4.9  TSH   2018-02-23    TSH 1.07   0.34-4.50  TSH   2017-11-19    TSH 2.43   0.34-4.50  TSH   2017-09-10    TSH 0.26   0.34-4.50  Free T4   2017-09-10    FT4 1.00   0.61-1.12  TSH   2017-07-17    TSH 0.12   0.34-4.50  Free T4   2017-07-17    FT4 1.00   0.61-1.12   Antithyroid antibodies:    2018-06-07    Thyroid Peroxidase (TPO) Ab 52   0-34  Thyroglobulin Antibody <1.0   0.0-0.9   At last visit, she described: - weight gain - fatigue - insomnia - hot flushes  She continues to have hot flashes. Also, she has a hard time losing weight.  Pt denies feeling nodules in neck, hoarseness, dysphagia/odynophagia.  She has + FH of thyroid disorders in: M. No FH of thyroid cancer.  No h/o radiation tx to head or neck. No recent use of iodine supplements.  Reviewing Dr. Daune Perch notes, she had  investigation for hirsutism/adrenal disorders in the past: Cortisol, Salivary X2, Timed ([id number])   2017-06-08    #1 Salivary Cortisol 0.032      #2 Salivary Cortisol 0.022       Testosterone   2017-06-02    TESTO 45.5   8.0-48.0  Vitamin B12   2017-06-02    B12 1400   180-914  Vitamin D 25(OH) Total   2017-06-02    17-OH Progesterone LCMS ([id number])   2017-06-02    Androstenedione LCMS ([id number])   2017-06-02    DHEA-Sulfate (004020)   2017-06-02    DHEA-Sulfate 181.2   41.2-243.7  IGF-1 ([id number])   2017-06-02    Insulin-Like Growth Factor I 134   57-195  Metanephrines, Frac., Pl. Free [phone])   2017-06-02    Metanephrine, Pl 23   0-62  Normetanephrine, Pl 132   0-145   Pt. also has a history of breast cancer, vitamin D deficiency. She had a recurrence of her Lymphedema in 08/2021.  She also had plantar fasciitis in L foot in 08/2021.  She got shoe inserts - pain improved. She developed B leg swelling in 2023. No blood clots. She was started on diuretic - prn.   For exercise, she is stretching 5 times a week, also walking/biking occasionally.  ROS:+ see HPI  Past Medical History:  Diagnosis Date   Abnormal echocardiogram 11/28/2021   EF 60 -65%, abnormal global longitudinal strain, see cardiology OV in Epic dated 12/25/21   Breast cancer (HCC)    Cancer (HCC) 09/2020   left breast, ER+, s/p radiation therapy 12/25/20 - 01/18/21, Patient follows with Dr. Serena Croissant @ [facility].   COVID-19 11/2019   flu-like symptoms, cough, no hospitalizations   Edema    lower extremity, follows w/ [facility], Sabino Dick, [place]. See OV dated 03/24/22 in Epic.   GERD (gastroesophageal reflux disease)    Follows w/ Dr. Judeen Hammans, gastroenterologist and PCP, Dr. Maryruth Hancock [facility] @ [facility].   Hashimoto's disease    Follows with Dr. Ernest Haber @ [place].   History of hiatal hernia    2 cm hiatal hernia per 01/08/22 EGD   History of radiation therapy 2022    completed 01-18-2021   [facility] (hard of hearing)    has gotten  bilateral aides.   Hypothyroidism    Follows w/ endocronologist, Dr. Ernest Haber @ [facility].   Lymphedema    left arm, pt uses lymphedema pump at night   Mitral regurgitation    mild by echo 06/2020   Myelin oligodendrocyte glycoprotein antibody disorder (MOGAD)    Follows with Dr. Despina Arias @ [facility].   Optic neuritis    Follows w/ opthamology, Dr. Mora Appl.03/2020 right eye (almost complete blindness), 04/2020 left eye   Personal history of radiation therapy    PONV (postoperative nausea and vomiting)    Status post dilation of esophageal narrowing 01/08/2022   EGD, esophagus dilated   Wears glasses    Past Surgical History:  Procedure Laterality Date   ABDOMINAL HYSTERECTOMY  04/23/22   BIOPSY BREAST Left 10/17/2020   BREAST LUMPECTOMY     BREAST LUMPECTOMY WITH RADIOACTIVE SEED AND SENTINEL LYMPH NODE BIOPSY Left 11/15/2020   Procedure: LEFT BREAST LUMPECTOMY WITH RADIOACTIVE SEED AND LEFT SENTINEL LYMPH NODE BIOPSY;  Surgeon: Harriette Bouillon, MD;  Location: [facility];  Service: General;  Laterality: Left;   BREAST SURGERY  August 2022   Cancer surgery   COLONOSCOPY  09/06/2021   cecal polyp   CT CORONARY CA SCORING  07/13/2020   Coronary Calcium = 0 (in Epic)   CYSTOSCOPY N/A 04/23/2022   Procedure: CYSTOSCOPY;  Surgeon: Jerene Bears, MD;  Location: [facility];  Service: Gynecology;  Laterality: N/A;   ESOPHAGOGASTRODUODENOSCOPY  01/08/2022   2cm hiatal hernia, esophagus dilated   TOTAL LAPAROSCOPIC HYSTERECTOMY WITH SALPINGECTOMY Bilateral 04/23/2022   Procedure: TOTAL LAPAROSCOPIC HYSTERECTOMY BILATERAL SALPINGECTOMY, LEFT OOPHORECTOMY;  Surgeon: Jerene Bears, MD;  Location: [facility] [place];  Service: Gynecology;  Laterality: Bilateral;   TRANSTHORACIC ECHOCARDIOGRAM  11/28/2021   see results in Epic   TRANSTHORACIC ECHOCARDIOGRAM  07/05/2020    in Epic   TUBAL LIGATION  02/2012   WISDOM TOOTH EXTRACTION     early 1990's   Social History   Socioeconomic History   Marital status: Married    Spouse name: Not on file   Number of children: 2   Years of education: BS   Highest education level: Not on file  Occupational History   Occupation: Psychologist, occupational   Occupation: Shipping and receiving clerk  Tobacco Use   Smoking status: Never   Smokeless tobacco: Not on file  Vaping Use   Vaping status: Never Used  Substance and Sexual Activity   Alcohol use: Never   Drug use: Never   Sexual activity: Yes    Birth control/protection: Surgical, None    Comment: hysterectomy  Other Topics Concern   Not on file  Social History Narrative   Right handed    Caffeine use: rare   Social Determinants of Health   Financial Resource Strain: Low Risk  (03/19/2021)   Overall Financial Resource Strain (CARDIA)    Difficulty of Paying Living Expenses: Not hard at all  Food Insecurity: Low Risk  (10/21/2022)   [facility]   Food vital sign    Within the past 12 months, you worried that your food would run out before you got money to buy more: Never true    Within the past 12 months, the food you bought just didn't last and you didn't have money to get more. : Never true  Transportation Needs: No Transportation Needs (03/19/2021)   PRAPARE - Administrator, Civil Service (Medical):  No    Lack of Transportation (Non-Medical): No  Physical Activity: Inactive (03/19/2021)   Exercise Vital Sign    Days of Exercise per Week: 0 days    Minutes of Exercise per Session: 0 min  Stress: No Stress Concern Present (03/19/2021)   [employer] of Occupational Health - Occupational Stress Questionnaire    Feeling of Stress : Not at all  Social Connections: Socially Integrated (03/19/2021)   Social Connection and Isolation Panel [NHANES]    Frequency of Communication with Friends and Family: More than three  times a week    Frequency of Social Gatherings with Friends and Family: Three times a week    Attends Religious Services: More than 4 times per year    Active Member of Clubs or Organizations: Yes    Attends Banker Meetings: 1 to 4 times per year    Marital Status: Married  Catering manager Violence: Not At Risk (03/19/2021)   Humiliation, Afraid, Rape, and Kick questionnaire    Fear of Current or Ex-Partner: No    Emotionally Abused: No    Physically Abused: No    Sexually Abused: No   Current Outpatient Medications on File Prior to Visit  Medication Sig Dispense Refill   ascorbic acid (VITAMIN C) 1000 MG tablet Take 1 tablet by mouth daily.     cholecalciferol (VITAMIN D3) 25 MCG (1000 UNIT) tablet Take 5,000 Units by mouth daily.     furosemide (LASIX) 20 MG tablet Take 1 tablet (20 mg total) by mouth daily. (Patient taking differently: Take 20 mg by mouth as needed.) 90 tablet 2   levothyroxine (SYNTHROID) 88 MCG tablet Take 1 tablet (88 mcg total) by mouth daily before breakfast. 88 mcg six days a week 60 tablet 0   MAGNESIUM GLUCONATE PO Take 2 tablets by mouth daily at 12 noon.     omeprazole (PRILOSEC) 20 MG capsule Take 1 capsule (20 mg total) by mouth daily. TAKE 1 CAPSULE BY MOUTH DAILY 90 capsule 3   Probiotic Product (CULTRELLE KIDS IMMUNE DEFENSE PO) Take 1 tablet by mouth daily.     Tocilizumab (ACTEMRA) 162 MG/0.9ML SOSY Inject 0.9 mLs (162 mg total) into the skin once a week. For MOGAD Takes weekly on Saturday. 10.8 mL 3   UNABLE TO FIND Take 4 tablets by mouth daily. Med Name: Omega Pure 2 tablet in AM and 1 tablet in PM     UNABLE TO FIND Take by mouth daily. Med Name: Ultra Inflamix 2 scoops daily     UNABLE TO FIND Take 2 tablets by mouth daily. Med Name: R-Lipoic Acid     No current facility-administered medications on file prior to visit.   No Known Allergies Family History  Problem Relation Age of Onset   Hearing loss Mother    Thyroid disease  Mother    Cancer Mother 74       endometrial cancer, lynch negative   GER disease Mother    Other Mother        uterine fibroids, optic disc abnormality    Eczema Mother    Colon polyps Mother    Hypothyroidism Mother    Osteoarthritis Mother    Hypertension Mother    Migraines Mother    Colon polyps Father    Hearing loss Father    Other Father        Colon adenoma   Hyperlipidemia Father    GER disease Father    Heart murmur Father  Amblyopia Father        Right eye   Neuropathy Father    Glaucoma Maternal Grandmother    Macular degeneration Maternal Grandmother    Arthritis Maternal Grandmother    Multiple sclerosis Paternal Grandmother    Arthritis Maternal Grandfather    Heart disease Paternal Grandfather    Diabetes Maternal Aunt    Breast cancer Neg Hx    Colon cancer Neg Hx    Esophageal cancer Neg Hx    Rectal cancer Neg Hx    Stomach cancer Neg Hx    PE: BP (!) 120/90   Pulse 82   Ht 5' 5.5" (1.664 m)   Wt 184 lb 6.4 oz (83.6 kg)   LMP 12/23/2021 (Exact Date)   SpO2 95%   BMI 30.22 kg/m  Wt Readings from Last 3 Encounters:  12/08/22 184 lb 6.4 oz (83.6 kg)  10/22/22 180 lb (81.6 kg)  09/03/22 181 lb (82.1 kg)   Constitutional: overweight, in NAD Eyes: no exophthalmos ENT: no masses palpated in neck, no cervical lymphadenopathy Cardiovascular: RRR, No MRG Respiratory: CTA B Musculoskeletal: no deformities Skin:  no rashes Neurological: no tremor with outstretched hands  ASSESSMENT: 1. Hypothyroidism  PLAN:  1. Patient with longstanding hypothyroidism, on levothyroxine therapy -No neck compression symptoms at today's visit.  She had a thyroid ultrasound in the past without significant findings, per her recall - latest thyroid labs reviewed with pt. >> normal: Lab Results  Component Value Date   TSH 1.664 08/29/2022  - she continues on LT4 88 mcg daily 6/7 days, with the latest dose decreased 05/2022. - pt feels good on this dose.  She  does have heat intolerance, which is not new.  This is possibly related to tamoxifen. - we discussed about taking the thyroid hormone every day, with water, >30 minutes before breakfast, separated by >4 hours from acid reflux medications, calcium, iron, multivitamins. Pt. is taking it correctly.  She was previously taking it together with tamoxifen, but we separated them.  Since last visit, she actually ended up stopping tamoxifen.  Also, she was then taking a hormonal support supplement including 560 mg of calcium  too close to levothyroxine.  We moved this later in the day.  After these changes, her TSH level became suppressed so we had to gradually decrease the dose of her levothyroxine.  She is taking this correctly now. - will check thyroid tests today: TSH and fT4 - If labs are abnormal, she will need to return for repeat TFTs in 1.5 months - OTW, I will have her back in 1 year.  Needs refills.  Carlus Pavlov, MD PhD [facility]

## 2022-12-08 NOTE — Patient Instructions (Addendum)
Please continue Levothyroxine 88 mcg 6/7 days.  Take the thyroid hormone every day, with water, at least 30 minutes before breakfast, separated by at least 4 hours from: - acid reflux medications - calcium - iron - multivitamins  Please stop at the lab.  Please come back for a follow-up appointment in 1 year but for labs, in 6 months.

## 2022-12-09 LAB — T4, FREE: Free T4: 0.9 ng/dL (ref 0.60–1.60)

## 2022-12-09 LAB — TSH: TSH: 4.79 u[IU]/mL (ref 0.35–5.50)

## 2022-12-09 MED ORDER — LEVOTHYROXINE SODIUM 88 MCG PO TABS
ORAL_TABLET | ORAL | 3 refills | Status: DC
Start: 2022-12-09 — End: 2023-09-01

## 2022-12-12 ENCOUNTER — Other Ambulatory Visit: Payer: Self-pay | Admitting: Medical Genetics

## 2022-12-12 DIAGNOSIS — Z006 Encounter for examination for normal comparison and control in clinical research program: Secondary | ICD-10-CM

## 2022-12-14 ENCOUNTER — Encounter: Payer: Self-pay | Admitting: Internal Medicine

## 2022-12-14 ENCOUNTER — Encounter: Payer: Self-pay | Admitting: Adult Health

## 2022-12-16 ENCOUNTER — Encounter: Payer: Self-pay | Admitting: Family Medicine

## 2022-12-16 ENCOUNTER — Other Ambulatory Visit: Payer: Self-pay | Admitting: Internal Medicine

## 2022-12-16 DIAGNOSIS — E038 Other specified hypothyroidism: Secondary | ICD-10-CM

## 2022-12-17 ENCOUNTER — Other Ambulatory Visit: Payer: Self-pay | Admitting: *Deleted

## 2022-12-17 DIAGNOSIS — S8990XA Unspecified injury of unspecified lower leg, initial encounter: Secondary | ICD-10-CM

## 2022-12-28 ENCOUNTER — Encounter: Payer: Self-pay | Admitting: Neurology

## 2022-12-31 ENCOUNTER — Encounter (HOSPITAL_BASED_OUTPATIENT_CLINIC_OR_DEPARTMENT_OTHER): Payer: Self-pay | Admitting: Orthopaedic Surgery

## 2022-12-31 ENCOUNTER — Ambulatory Visit (HOSPITAL_BASED_OUTPATIENT_CLINIC_OR_DEPARTMENT_OTHER): Payer: Commercial Managed Care - PPO

## 2022-12-31 ENCOUNTER — Ambulatory Visit (HOSPITAL_BASED_OUTPATIENT_CLINIC_OR_DEPARTMENT_OTHER): Payer: Commercial Managed Care - PPO | Admitting: Orthopaedic Surgery

## 2022-12-31 DIAGNOSIS — M25561 Pain in right knee: Secondary | ICD-10-CM

## 2022-12-31 DIAGNOSIS — G8929 Other chronic pain: Secondary | ICD-10-CM | POA: Diagnosis not present

## 2022-12-31 DIAGNOSIS — Q6589 Other specified congenital deformities of hip: Secondary | ICD-10-CM | POA: Diagnosis not present

## 2022-12-31 DIAGNOSIS — S838X1A Sprain of other specified parts of right knee, initial encounter: Secondary | ICD-10-CM | POA: Diagnosis not present

## 2022-12-31 NOTE — Progress Notes (Addendum)
Chief Complaint: Right hip, right knee pain     History of Present Illness:    Valerie [name] is a 52 y.o. female presents today with right hip and knee pain which has been ongoing for clear to me that a twisting type injury in June of this year when she was getting out of a plane seat.  Since that time she has had lateral based knee pain.  She did initially have pain with putting weight on this.  With regard to right hip she has been having pain persistently for the last several years.  She has not previously had any physical therapy for this.  Does have some pain deep in the hip joint in the anterolateral hip  She has trialed anti-inflammatories before.  She has previously had significant reaction to steroids and is essentially not able to take these due to a demyelinating condition   Surgical History:   None  PMH/PSH/Family History/Social History/Meds/Allergies:    Past Medical History:  Diagnosis Date   Abnormal echocardiogram 11/28/2021   EF 60 -65%, abnormal global longitudinal strain, see cardiology OV in Epic dated 12/25/21   Breast cancer (HCC)    Cancer (HCC) 09/2020   left breast, ER+, s/p radiation therapy 12/25/20 - 01/18/21, Patient follows with Dr. Serena Croissant @ [facility].   COVID-19 11/2019   flu-like symptoms, cough, no hospitalizations   Edema    lower extremity, follows w/ [facility], Sabino Dick, [place]. See OV dated 03/24/22 in Epic.   GERD (gastroesophageal reflux disease)    Follows w/ Dr. Judeen Hammans, gastroenterologist and PCP, Dr. Maryruth Hancock [facility] @ [facility].   Hashimoto's disease    Follows with Dr. Ernest Haber @ [place].   History of hiatal hernia    2 cm hiatal hernia per 01/08/22 EGD   History of radiation therapy 2022   completed 01-18-2021   [facility] (hard of hearing)    has gotten bilateral aides.   Hypothyroidism    Follows w/ endocronologist, Dr. Ernest Haber @ [place].    Lymphedema    left arm, pt uses lymphedema pump at night   Mitral regurgitation    mild by echo 06/2020   Myelin oligodendrocyte glycoprotein antibody disorder (MOGAD)    Follows with Dr. Despina Arias @ [facility].   Optic neuritis    Follows w/ opthamology, Dr. Mora Appl.03/2020 right eye (almost complete blindness), 04/2020 left eye   Personal history of radiation therapy    PONV (postoperative nausea and vomiting)    Status post dilation of esophageal narrowing 01/08/2022   EGD, esophagus dilated   Wears glasses    Past Surgical History:  Procedure Laterality Date   ABDOMINAL HYSTERECTOMY  04/23/22   BIOPSY BREAST Left 10/17/2020   BREAST LUMPECTOMY     BREAST LUMPECTOMY WITH RADIOACTIVE SEED AND SENTINEL LYMPH NODE BIOPSY Left 11/15/2020   Procedure: LEFT BREAST LUMPECTOMY WITH RADIOACTIVE SEED AND LEFT SENTINEL LYMPH NODE BIOPSY;  Surgeon: Harriette Bouillon, MD;  Location: [facility];  Service: General;  Laterality: Left;   BREAST SURGERY  August 2022   Cancer surgery   COLONOSCOPY  09/06/2021   cecal polyp   CT CORONARY CA SCORING  07/13/2020   Coronary Calcium = 0 (in Epic)   CYSTOSCOPY N/A 04/23/2022   Procedure:  CYSTOSCOPY;  Surgeon: Jerene Bears, MD;  Location: [facility];  Service: Gynecology;  Laterality: N/A;   ESOPHAGOGASTRODUODENOSCOPY  01/08/2022   2cm hiatal hernia, esophagus dilated   TOTAL LAPAROSCOPIC HYSTERECTOMY WITH SALPINGECTOMY Bilateral 04/23/2022   Procedure: TOTAL LAPAROSCOPIC HYSTERECTOMY BILATERAL SALPINGECTOMY, LEFT OOPHORECTOMY;  Surgeon: Jerene Bears, MD;  Location: [facility];  Service: Gynecology;  Laterality: Bilateral;   TRANSTHORACIC ECHOCARDIOGRAM  11/28/2021   see results in Epic   TRANSTHORACIC ECHOCARDIOGRAM  07/05/2020   in Epic   TUBAL LIGATION  02/2012   WISDOM TOOTH EXTRACTION     early 1990's   Social History   Socioeconomic History   Marital status: Married    Spouse  name: Not on file   Number of children: 2   Years of education: BS   Highest education level: Not on file  Occupational History   Occupation: Psychologist, occupational   Occupation: Shipping and receiving clerk  Tobacco Use   Smoking status: Never   Smokeless tobacco: Not on file  Vaping Use   Vaping status: Never Used  Substance and Sexual Activity   Alcohol use: Never   Drug use: Never   Sexual activity: Yes    Birth control/protection: Surgical, None    Comment: hysterectomy  Other Topics Concern   Not on file  Social History Narrative   Right handed    Caffeine use: rare   Social Determinants of Health   Financial Resource Strain: Low Risk  (03/19/2021)   Overall Financial Resource Strain (CARDIA)    Difficulty of Paying Living Expenses: Not hard at all  Food Insecurity: Low Risk  (10/21/2022)   [facility]   Hunger Vital Sign    Worried About Running Out of Food in the Last Year: Never true    Ran Out of Food in the Last Year: Never true  Transportation Needs: No Transportation Needs (03/19/2021)   PRAPARE - Administrator, Civil Service (Medical): No    Lack of Transportation (Non-Medical): No  Physical Activity: Inactive (03/19/2021)   Exercise Vital Sign    Days of Exercise per Week: 0 days    Minutes of Exercise per Session: 0 min  Stress: No Stress Concern Present (03/19/2021)   [employer] of Occupational Health - Occupational Stress Questionnaire    Feeling of Stress : Not at all  Social Connections: Socially Integrated (03/19/2021)   Social Connection and Isolation Panel [NHANES]    Frequency of Communication with Friends and Family: More than three times a week    Frequency of Social Gatherings with Friends and Family: Three times a week    Attends Religious Services: More than 4 times per year    Active Member of Clubs or Organizations: Yes    Attends Banker Meetings: 1 to 4 times per year    Marital  Status: Married   Family History  Problem Relation Age of Onset   Hearing loss Mother    Thyroid disease Mother    Cancer Mother 68       endometrial cancer, lynch negative   GER disease Mother    Other Mother        uterine fibroids, optic disc abnormality    Eczema Mother    Colon polyps Mother    Hypothyroidism Mother    Osteoarthritis Mother    Hypertension Mother    Migraines Mother    Colon polyps Father    Hearing loss  Father    Other Father        Colon adenoma   Hyperlipidemia Father    GER disease Father    Heart murmur Father    Amblyopia Father        Right eye   Neuropathy Father    Glaucoma Maternal Grandmother    Macular degeneration Maternal Grandmother    Arthritis Maternal Grandmother    Multiple sclerosis Paternal Grandmother    Arthritis Maternal Grandfather    Heart disease Paternal Grandfather    Diabetes Maternal Aunt    Breast cancer Neg Hx    Colon cancer Neg Hx    Esophageal cancer Neg Hx    Rectal cancer Neg Hx    Stomach cancer Neg Hx    No Known Allergies Current Outpatient Medications  Medication Sig Dispense Refill   ascorbic acid (VITAMIN C) 1000 MG tablet Take 1 tablet by mouth daily.     cholecalciferol (VITAMIN D3) 25 MCG (1000 UNIT) tablet Take 5,000 Units by mouth daily.     furosemide (LASIX) 20 MG tablet Take 1 tablet (20 mg total) by mouth daily. (Patient taking differently: Take 20 mg by mouth as needed.) 90 tablet 2   levothyroxine (SYNTHROID) 88 MCG tablet Take by mouth 88 mcg six days a week 75 tablet 3   MAGNESIUM GLUCONATE PO Take 2 tablets by mouth daily at 12 noon.     omeprazole (PRILOSEC) 20 MG capsule Take 1 capsule (20 mg total) by mouth daily. TAKE 1 CAPSULE BY MOUTH DAILY 90 capsule 3   Probiotic Product (CULTRELLE KIDS IMMUNE DEFENSE PO) Take 1 tablet by mouth daily.     Tocilizumab (ACTEMRA) 162 MG/0.9ML SOSY Inject 0.9 mLs (162 mg total) into the skin once a week. For MOGAD Takes weekly on Saturday. 10.8 mL 3    UNABLE TO FIND Take 4 tablets by mouth daily. Med Name: Omega Pure 2 tablet in AM and 1 tablet in PM     UNABLE TO FIND Take by mouth daily. Med Name: Ultra Inflamix 2 scoops daily     UNABLE TO FIND Take 2 tablets by mouth daily. Med Name: R-Lipoic Acid     No current facility-administered medications for this visit.   No results found.  Review of Systems:   A ROS was performed including pertinent positives and negatives as documented in the HPI.  Physical Exam :   Constitutional: NAD and appears stated age Neurological: Alert and oriented Psych: Appropriate affect and cooperative Last menstrual period 12/23/2021.   Comprehensive Musculoskeletal Exam:    Positive FADIR about the right hip with 30 degrees internal and 50 degrees external rotation.  Active flexion is to 90 degrees of the right hip.  No palpable clicking about the right hip.  Positive tenderness about the lateral joint line of the right knee.  Range of motion is from -3-135.  Positive McMurray laterally.  Imaging:   Xray (4 views right knee, 4 views right hip): There is a significant component of dysplasia of the right hip as well as a normal right knee   I personally reviewed and interpreted the radiographs.   Assessment:   52 y.o. female with possibility of a right lateral meniscal tear as she did have initial pain with a twisting injury and inability to bear weight.  I do believe that she is having a difficult time rehabbing and recovering from this due to a component of hip dysplasia on the right.  Ultimately I do believe  that both of these are symptomatic and to that effect we did discuss the role of a right hip MRI as well as a right knee MRI.  Will plan to proceed with this and I will see her back following discuss results  Plan :    -Plan for MRI of right knee and right hip     I personally saw and evaluated the patient, and participated in the management and treatment plan.  Huel Cote,  MD Attending Physician, Orthopedic Surgery  This document was dictated using Dragon voice recognition software. A reasonable attempt at proof reading has been made to minimize errors.

## 2023-01-04 ENCOUNTER — Other Ambulatory Visit: Payer: Self-pay | Admitting: Family Medicine

## 2023-01-09 ENCOUNTER — Ambulatory Visit
Admission: RE | Admit: 2023-01-09 | Discharge: 2023-01-09 | Disposition: A | Discharge status: Home / Self Care | Payer: Commercial Managed Care - PPO | Source: Ambulatory Visit | Attending: Orthopaedic Surgery | Admitting: Orthopaedic Surgery

## 2023-01-09 DIAGNOSIS — Q6589 Other specified congenital deformities of hip: Secondary | ICD-10-CM

## 2023-01-09 DIAGNOSIS — S838X1A Sprain of other specified parts of right knee, initial encounter: Secondary | ICD-10-CM

## 2023-01-14 ENCOUNTER — Ambulatory Visit: Payer: Commercial Managed Care - PPO | Attending: Surgery | Admitting: Rehabilitation

## 2023-01-14 ENCOUNTER — Ambulatory Visit: Payer: Self-pay | Admitting: Rehabilitation

## 2023-01-14 DIAGNOSIS — Z483 Aftercare following surgery for neoplasm: Secondary | ICD-10-CM | POA: Insufficient documentation

## 2023-01-14 NOTE — Therapy (Signed)
OUTPATIENT PHYSICAL THERAPY SOZO SCREENING NOTE   Patient Name: Valerie [name] MRN: [id number] DOB:[birth year], 52 y.o., female Today's Date: 01/14/2023  PCP: [name], MD REFERRING PROVIDER: Harriette Bouillon, MD   PT End of Session - 01/14/23 1716     Visit Number 16   screen only   PT Start Time 1650    PT Stop Time 1700    PT Time Calculation (min) 10 min    Activity Tolerance Patient tolerated treatment well    Behavior During Therapy [facility] for tasks assessed/performed             Past Medical History:  Diagnosis Date   Abnormal echocardiogram 11/28/2021   EF 60 -65%, abnormal global longitudinal strain, see cardiology OV in Epic dated 12/25/21   Breast cancer (HCC)    Cancer (HCC) 09/2020   left breast, ER+, s/p radiation therapy 12/25/20 - 01/18/21, Patient follows with Dr. Serena Croissant @ [facility].   COVID-19 11/2019   flu-like symptoms, cough, no hospitalizations   Edema    lower extremity, follows w/ [facility], Sabino Dick, [place]. See OV dated 03/24/22 in Epic.   GERD (gastroesophageal reflux disease)    Follows w/ Dr. Judeen Hammans, gastroenterologist and PCP, Dr. Maryruth Hancock [facility].   Hashimoto's disease    Follows with Dr. Ernest Haber @ [place].   History of hiatal hernia    2 cm hiatal hernia per 01/08/22 EGD   History of radiation therapy 2022   completed 01-18-2021   [facility] (hard of hearing)    has gotten bilateral aides.   Hypothyroidism    Follows w/ endocronologist, Dr. Ernest Haber @ [place].   Lymphedema    left arm, pt uses lymphedema pump at night   Mitral regurgitation    mild by echo 06/2020   Myelin oligodendrocyte glycoprotein antibody disorder (MOGAD)    Follows with Dr. Despina Arias @ [facility].   Optic neuritis    Follows w/ opthamology, Dr. Mora Appl.03/2020 right eye (almost complete blindness), 04/2020 left eye   Personal history of radiation therapy    PONV  (postoperative nausea and vomiting)    Status post dilation of esophageal narrowing 01/08/2022   EGD, esophagus dilated   Wears glasses    Past Surgical History:  Procedure Laterality Date   ABDOMINAL HYSTERECTOMY  04/23/22   BIOPSY BREAST Left 10/17/2020   BREAST LUMPECTOMY     BREAST LUMPECTOMY WITH RADIOACTIVE SEED AND SENTINEL LYMPH NODE BIOPSY Left 11/15/2020   Procedure: LEFT BREAST LUMPECTOMY WITH RADIOACTIVE SEED AND LEFT SENTINEL LYMPH NODE BIOPSY;  Surgeon: Harriette Bouillon, MD;  Location: [facility];  Service: General;  Laterality: Left;   BREAST SURGERY  August 2022   Cancer surgery   COLONOSCOPY  09/06/2021   cecal polyp   CT CORONARY CA SCORING  07/13/2020   Coronary Calcium = 0 (in Epic)   CYSTOSCOPY N/A 04/23/2022   Procedure: CYSTOSCOPY;  Surgeon: Jerene Bears, MD;  Location: [facility];  Service: Gynecology;  Laterality: N/A;   ESOPHAGOGASTRODUODENOSCOPY  01/08/2022   2cm hiatal hernia, esophagus dilated   TOTAL LAPAROSCOPIC HYSTERECTOMY WITH SALPINGECTOMY Bilateral 04/23/2022   Procedure: TOTAL LAPAROSCOPIC HYSTERECTOMY BILATERAL SALPINGECTOMY, LEFT OOPHORECTOMY;  Surgeon: Jerene Bears, MD;  Location: [facility];  Service: Gynecology;  Laterality: Bilateral;   TRANSTHORACIC ECHOCARDIOGRAM  11/28/2021   see results in Epic   TRANSTHORACIC ECHOCARDIOGRAM  07/05/2020   in Epic  TUBAL LIGATION  02/2012   WISDOM TOOTH EXTRACTION     early 1990's   Patient Active Problem List   Diagnosis Date Noted   Genetic testing 07/04/2022   Family history of uterine cancer 06/24/2022   Local edema 02/25/2022   Hypothyroidism due to Hashimoto's thyroiditis 06/07/2021   Malignant neoplasm of lower-inner quadrant of left breast in female, estrogen receptor positive (HCC) 10/23/2020   Optic neuritis, left 10/18/2020   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) 07/10/2020   Mitral regurgitation     Neuromyelitis optica (devic) (HCC) 05/07/2020   Hypothyroid 04/25/2020   Optic neuritis, right 04/24/2020    REFERRING DIAG: left breast cancer at risk for lymphedema  THERAPY DIAG:  Aftercare following surgery for neoplasm  PERTINENT HISTORY: Left breast cancer. 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 It measures 6 mm in the lower inner quadrant. it is ER/PR positive and HER2 negaitve with a Ki67 of < 5%. 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 She is treated with prednisone which impacts fluid retention. She had left lumpectomy on 11/15/2020 with 0/3 lymph nodes removed. She says that she has a problem with passing out with medical things but can let us know if she feels this might happen.   PRECAUTIONS: left UE Lymphedema risk,  SUBJECTIVE: The sleeve has just been feeling uncomfortable and my rings have been fitting recently.    PAIN:  Are you having pain? No  SOZO SCREENING: Patient was assessed today using the SOZO machine to determine the lymphedema index score. Her last score was in the red around 52 year  ago and she was given a pump, compression day and night garments.  It was determined that she is within the recommended range when compared to her baseline and no further action is needed at this time. She will continue SOZO screenings. These are done every 3 months for 52 years post operatively followed by every 6 months for 52 years, and then annually.  Will continue with no sleeve or sleeve as needed     Idamae Lusher, PT 01/14/2023, 5:16 PM

## 2023-01-20 NOTE — Telephone Encounter (Signed)
Dohmeier, Porfirio Mylar, MD  You13 days ago  CD Patient can take meds at [facility] input has to come from Dr. Epimenio Foot  I am not very familiar with Actemra. .CD

## 2023-01-23 ENCOUNTER — Ambulatory Visit (HOSPITAL_BASED_OUTPATIENT_CLINIC_OR_DEPARTMENT_OTHER): Payer: Commercial Managed Care - PPO | Admitting: Orthopaedic Surgery

## 2023-01-23 DIAGNOSIS — Q6589 Other specified congenital deformities of hip: Secondary | ICD-10-CM

## 2023-01-23 NOTE — Progress Notes (Signed)
Chief Complaint: Right hip, right knee pain     History of Present Illness:   01/23/2023: Presents today for follow-up of her right hip and right knee.  She states that the hip has been more bothersome recently.  There is still some pain about the lateral aspect of the hip.  Valerie [name] is a 52 y.o. female presents today with right hip and knee pain which has been ongoing for clear to me that a twisting type injury in June of this year when she was getting out of a plane seat.  Since that time she has had lateral based knee pain.  She did initially have pain with putting weight on this.  With regard to right hip she has been having pain persistently for the last several years.  She has not previously had any physical therapy for this.  Does have some pain deep in the hip joint in the anterolateral hip  She has trialed anti-inflammatories before.  She has previously had significant reaction to steroids and is essentially not able to take these due to a demyelinating condition   Surgical History:   None  PMH/PSH/Family History/Social History/Meds/Allergies:    Past Medical History:  Diagnosis Date   Abnormal echocardiogram 11/28/2021   EF 60 -65%, abnormal global longitudinal strain, see cardiology OV in Epic dated 12/25/21   Breast cancer (HCC)    Cancer (HCC) 09/2020   left breast, ER+, s/p radiation therapy 12/25/20 - 01/18/21, Patient follows with Dr. Serena Croissant @ [facility].   COVID-19 11/2019   flu-like symptoms, cough, no hospitalizations   Edema    lower extremity, follows w/ [facility], Sabino Dick, [place]. See OV dated 03/24/22 in Epic.   GERD (gastroesophageal reflux disease)    Follows w/ Dr. Judeen Hammans, gastroenterologist and PCP, Dr. [facility] @ [facility].   Hashimoto's disease    Follows with Dr. Ernest Haber @ [place].   History of hiatal hernia    2 cm hiatal hernia per 01/08/22 EGD    History of radiation therapy 2022   completed 01-18-2021   [facility] (hard of hearing)    has gotten bilateral aides.   Hypothyroidism    Follows w/ endocronologist, Dr. Ernest Haber @ [place].   Lymphedema    left arm, pt uses lymphedema pump at night   Mitral regurgitation    mild by echo 06/2020   Myelin oligodendrocyte glycoprotein antibody disorder (MOGAD)    Follows with Dr. Despina Arias @ [facility].   Optic neuritis    Follows w/ opthamology, Dr. Mora Appl.03/2020 right eye (almost complete blindness), 04/2020 left eye   Personal history of radiation therapy    PONV (postoperative nausea and vomiting)    Status post dilation of esophageal narrowing 01/08/2022   EGD, esophagus dilated   Wears glasses    Past Surgical History:  Procedure Laterality Date   ABDOMINAL HYSTERECTOMY  04/23/22   BIOPSY BREAST Left 10/17/2020   BREAST LUMPECTOMY     BREAST LUMPECTOMY WITH RADIOACTIVE SEED AND SENTINEL LYMPH NODE BIOPSY Left 11/15/2020   Procedure: LEFT BREAST LUMPECTOMY WITH RADIOACTIVE SEED AND LEFT SENTINEL LYMPH NODE BIOPSY;  Surgeon: Harriette Bouillon, MD;  Location: [facility];  Service: General;  Laterality: Left;   BREAST SURGERY  August 2022  Cancer surgery   COLONOSCOPY  09/06/2021   cecal polyp   CT CORONARY CA SCORING  07/13/2020   Coronary Calcium = 0 (in Epic)   CYSTOSCOPY N/A 04/23/2022   Procedure: CYSTOSCOPY;  Surgeon: Jerene Bears, MD;  Location: [facility];  Service: Gynecology;  Laterality: N/A;   ESOPHAGOGASTRODUODENOSCOPY  01/08/2022   2cm hiatal hernia, esophagus dilated   TOTAL LAPAROSCOPIC HYSTERECTOMY WITH SALPINGECTOMY Bilateral 04/23/2022   Procedure: TOTAL LAPAROSCOPIC HYSTERECTOMY BILATERAL SALPINGECTOMY, LEFT OOPHORECTOMY;  Surgeon: Jerene Bears, MD;  Location: [facility];  Service: Gynecology;  Laterality: Bilateral;   TRANSTHORACIC ECHOCARDIOGRAM  11/28/2021   see results in Epic    TRANSTHORACIC ECHOCARDIOGRAM  07/05/2020   in Epic   TUBAL LIGATION  02/2012   WISDOM TOOTH EXTRACTION     early 1990's   Social History   Socioeconomic History   Marital status: Married    Spouse name: Not on file   Number of children: 2   Years of education: BS   Highest education level: Not on file  Occupational History   Occupation: Psychologist, occupational   Occupation: Shipping and receiving clerk  Tobacco Use   Smoking status: Never   Smokeless tobacco: Not on file  Vaping Use   Vaping status: Never Used  Substance and Sexual Activity   Alcohol use: Never   Drug use: Never   Sexual activity: Yes    Birth control/protection: Surgical, None    Comment: hysterectomy  Other Topics Concern   Not on file  Social History Narrative   Right handed    Caffeine use: rare   Social Determinants of Health   Financial Resource Strain: Low Risk  (03/19/2021)   Overall Financial Resource Strain (CARDIA)    Difficulty of Paying Living Expenses: Not hard at all  Food Insecurity: Low Risk  (10/21/2022)   [facility]   Hunger Vital Sign    Worried About Running Out of Food in the Last Year: Never true    Ran Out of Food in the Last Year: Never true  Transportation Needs: No Transportation Needs (03/19/2021)   PRAPARE - Administrator, Civil Service (Medical): No    Lack of Transportation (Non-Medical): No  Physical Activity: Inactive (03/19/2021)   Exercise Vital Sign    Days of Exercise per Week: 0 days    Minutes of Exercise per Session: 0 min  Stress: No Stress Concern Present (03/19/2021)   [employer] of Occupational Health - Occupational Stress Questionnaire    Feeling of Stress : Not at all  Social Connections: Socially Integrated (03/19/2021)   Social Connection and Isolation Panel [NHANES]    Frequency of Communication with Friends and Family: More than three times a week    Frequency of Social Gatherings with Friends and Family:  Three times a week    Attends Religious Services: More than 4 times per year    Active Member of Clubs or Organizations: Yes    Attends Banker Meetings: 1 to 4 times per year    Marital Status: Married   Family History  Problem Relation Age of Onset   Hearing loss Mother    Thyroid disease Mother    Cancer Mother 34       endometrial cancer, lynch negative   GER disease Mother    Other Mother        uterine fibroids, optic disc abnormality    Eczema Mother  Colon polyps Mother    Hypothyroidism Mother    Osteoarthritis Mother    Hypertension Mother    Migraines Mother    Colon polyps Father    Hearing loss Father    Other Father        Colon adenoma   Hyperlipidemia Father    GER disease Father    Heart murmur Father    Amblyopia Father        Right eye   Neuropathy Father    Glaucoma Maternal Grandmother    Macular degeneration Maternal Grandmother    Arthritis Maternal Grandmother    Multiple sclerosis Paternal Grandmother    Arthritis Maternal Grandfather    Heart disease Paternal Grandfather    Diabetes Maternal Aunt    Breast cancer Neg Hx    Colon cancer Neg Hx    Esophageal cancer Neg Hx    Rectal cancer Neg Hx    Stomach cancer Neg Hx    No Known Allergies Current Outpatient Medications  Medication Sig Dispense Refill   ascorbic acid (VITAMIN C) 1000 MG tablet Take 1 tablet by mouth daily.     cholecalciferol (VITAMIN D3) 25 MCG (1000 UNIT) tablet Take 5,000 Units by mouth daily.     furosemide (LASIX) 20 MG tablet TAKE 1 TABLET BY MOUTH DAILY 90 tablet 3   levothyroxine (SYNTHROID) 88 MCG tablet Take by mouth 88 mcg six days a week 75 tablet 3   MAGNESIUM GLUCONATE PO Take 2 tablets by mouth daily at 12 noon.     omeprazole (PRILOSEC) 20 MG capsule Take 1 capsule (20 mg total) by mouth daily. TAKE 1 CAPSULE BY MOUTH DAILY 90 capsule 3   Probiotic Product (CULTRELLE KIDS IMMUNE DEFENSE PO) Take 1 tablet by mouth daily.     Tocilizumab  (ACTEMRA) 162 MG/0.9ML SOSY Inject 0.9 mLs (162 mg total) into the skin once a week. For MOGAD Takes weekly on Saturday. 10.8 mL 3   UNABLE TO FIND Take 4 tablets by mouth daily. Med Name: Omega Pure 2 tablet in AM and 1 tablet in PM     UNABLE TO FIND Take by mouth daily. Med Name: Ultra Inflamix 2 scoops daily     UNABLE TO FIND Take 2 tablets by mouth daily. Med Name: R-Lipoic Acid     No current facility-administered medications for this visit.   No results found.  Review of Systems:   A ROS was performed including pertinent positives and negatives as documented in the HPI.  Physical Exam :   Constitutional: NAD and appears stated age Neurological: Alert and oriented Psych: Appropriate affect and cooperative Last menstrual period 12/23/2021.   Comprehensive Musculoskeletal Exam:    Positive FADIR about the right hip with 30 degrees internal and 50 degrees external rotation.  Active flexion is to 90 degrees of the right hip.  No palpable clicking about the right hip.  Positive tenderness about the lateral joint line of the right knee.  Range of motion is from -3-135.  Positive McMurray laterally.  Imaging:   Xray (4 views right knee, 4 views right hip): There is a significant component of dysplasia of the right hip as well as a normal right knee  MRI right hip, MRI right knee: Partial-thickness chondral loss in the medial and central aspect of the patella.  There is a right hip anterior superior labral tear I personally reviewed and interpreted the radiographs.   Assessment:   52 y.o. female with evidence of right hip instability  which I do believe is exacerbating her patellofemoral type pain.  At this time I would begin initially with physical therapy for strengthening of the hip and core in an effort to help both her hip instability as well as the knee pain.  I will plan to see her back in 4 weeks for reassessment Plan :    -Return to clinic 4 weeks for  reassessment     I personally saw and evaluated the patient, and participated in the management and treatment plan.  Huel Cote, MD Attending Physician, Orthopedic Surgery  This document was dictated using Dragon voice recognition software. A reasonable attempt at proof reading has been made to minimize errors.

## 2023-01-26 ENCOUNTER — Encounter (HOSPITAL_BASED_OUTPATIENT_CLINIC_OR_DEPARTMENT_OTHER): Payer: Self-pay | Admitting: Orthopaedic Surgery

## 2023-01-26 NOTE — Telephone Encounter (Signed)
I spoke with patient. She will sign auth and pay form fee when she comes in for 10/22 PT appt.

## 2023-02-02 ENCOUNTER — Encounter: Payer: Self-pay | Admitting: Family Medicine

## 2023-02-03 ENCOUNTER — Telehealth: Payer: Self-pay | Admitting: Orthopaedic Surgery

## 2023-02-03 ENCOUNTER — Other Ambulatory Visit: Payer: Self-pay

## 2023-02-03 ENCOUNTER — Ambulatory Visit (INDEPENDENT_AMBULATORY_CARE_PROVIDER_SITE_OTHER): Payer: Commercial Managed Care - PPO | Admitting: Physical Therapy

## 2023-02-03 ENCOUNTER — Encounter: Payer: Self-pay | Admitting: Physical Therapy

## 2023-02-03 DIAGNOSIS — M25551 Pain in right hip: Secondary | ICD-10-CM

## 2023-02-03 DIAGNOSIS — M25561 Pain in right knee: Secondary | ICD-10-CM | POA: Diagnosis not present

## 2023-02-03 DIAGNOSIS — M25651 Stiffness of right hip, not elsewhere classified: Secondary | ICD-10-CM | POA: Diagnosis not present

## 2023-02-03 DIAGNOSIS — M6281 Muscle weakness (generalized): Secondary | ICD-10-CM

## 2023-02-03 NOTE — Therapy (Signed)
OUTPATIENT PHYSICAL THERAPY LOWER EXTREMITY EVALUATION   Patient Name: Valerie [name] MRN: [id number] DOB:03/28/1971, 52 y.o., female Today's Date: 02/03/2023  END OF SESSION:  PT End of Session - 02/03/23 1613     Visit Number 1    Number of Visits 9    Date for PT Re-Evaluation 03/06/23    Authorization Type UMR/UHC    Authorization Time Period 20% co-insurance, pt reports met OOP max for year    PT Start Time 1515    PT Stop Time 1555    PT Time Calculation (min) 40 min    Activity Tolerance Patient tolerated treatment well    Behavior During Therapy WFL for tasks assessed/performed             Past Medical History:  Diagnosis Date   Abnormal echocardiogram 11/28/2021   EF 60 -65%, abnormal global longitudinal strain, see cardiology OV in Epic dated 12/25/21   Breast cancer (HCC)    Cancer (HCC) 09/2020   left breast, ER+, s/p radiation therapy 12/25/20 - 01/18/21, Patient follows with Dr. Serena Croissant @ [facility].   COVID-19 11/2019   flu-like symptoms, cough, no hospitalizations   Edema    lower extremity, follows w/ [facility], Sabino Dick, [place]. See OV dated 03/24/22 in Epic.   GERD (gastroesophageal reflux disease)    Follows w/ Dr. Judeen Hammans, gastroenterologist and PCP, Dr. [facility] @ [facility].   Hashimoto's disease    Follows with Dr. Ernest Haber @ [place].   History of hiatal hernia    2 cm hiatal hernia per 01/08/22 EGD   History of radiation therapy 2022   completed 01-18-2021   [facility] (hard of hearing)    has gotten bilateral aides.   Hypothyroidism    Follows w/ endocronologist, Dr. Ernest Haber @ [place].   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 Despina Arias @ [facility].   Optic neuritis    Follows w/ opthamology, Dr. Mora Appl.03/2020 right eye (almost complete blindness), 04/2020 left eye   Personal history of radiation therapy    PONV (postoperative nausea and vomiting)    Status post dilation of esophageal narrowing 01/08/2022   EGD, esophagus dilated   Wears glasses    Past Surgical History:  Procedure Laterality Date   ABDOMINAL HYSTERECTOMY  04/23/22   BIOPSY BREAST Left 10/17/2020   BREAST LUMPECTOMY     BREAST LUMPECTOMY WITH RADIOACTIVE SEED AND SENTINEL LYMPH NODE BIOPSY Left 11/15/2020   Procedure: LEFT BREAST LUMPECTOMY WITH RADIOACTIVE SEED AND LEFT SENTINEL LYMPH NODE BIOPSY;  Surgeon: Harriette Bouillon, MD;  Location: [facility];  Service: General;  Laterality: Left;   BREAST SURGERY  August 2022   Cancer surgery   COLONOSCOPY  09/06/2021   cecal polyp   CT CORONARY CA SCORING  07/13/2020   Coronary Calcium = 0 (in Epic)   CYSTOSCOPY N/A 04/23/2022   Procedure: CYSTOSCOPY;  Surgeon: Jerene Bears, MD;  Location[facility];  Service: Gynecology;  Laterality: N/A;   ESOPHAGOGASTRODUODENOSCOPY  01/08/2022   2cm hiatal hernia, esophagus dilated   TOTAL LAPAROSCOPIC HYSTERECTOMY WITH SALPINGECTOMY Bilateral 04/23/2022   Procedure: TOTAL LAPAROSCOPIC HYSTERECTOMY BILATERAL SALPINGECTOMY, LEFT OOPHORECTOMY;  Surgeon: Jerene Bears, MD;  Location: [facility];  Service: Gynecology;  Laterality: Bilateral;   TRANSTHORACIC ECHOCARDIOGRAM  11/28/2021   see results in Epic   TRANSTHORACIC ECHOCARDIOGRAM  07/05/2020   in Epic   TUBAL LIGATION  02/2012   WISDOM TOOTH EXTRACTION     early 1990's   Patient Active Problem List   Diagnosis Date Noted   Genetic testing 07/04/2022   Family history of uterine cancer 06/24/2022   Local edema 02/25/2022   Hypothyroidism due to Hashimoto's thyroiditis 06/07/2021   Malignant neoplasm of lower-inner quadrant of left breast in female, estrogen receptor positive (HCC) 10/23/2020   Optic neuritis, left  10/18/2020   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) 07/10/2020   Mitral regurgitation    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 Dr. Steward Drone referred to PT for strengthening of hip, knee & core. She has been trying pool at [facility] but would like aquatic routine also.   PERTINENT HISTORY: Total Hysterectomy laproscopic 04/23/2022, breast CA treated radiation completed 2022, Hashimoto's disease, hiatal hernia, Myelin Oligodendrocyte Glycoprotein Antibody Disorder  PAIN:  NPRS scale: today 0/10 and over last week up to 5/10 Pain location: right hip lateral and knee ant lateral Pain description: quick grabbing  Aggravating factors: knee kneeling/ yoga Child's Pose, hip flexing with external rotation or doing something long period walking 15-20 minutes Relieving factors: goes away with 10-12 steps  PRECAUTIONS: None  WEIGHT BEARING RESTRICTIONS: No  FALLS:  Has patient fallen in last 6 months? No  LIVING ENVIRONMENT: Lives with: lives with their spouse and 2 cats Lives in: House ranch with basement (washer/dryer & garage where she parks) Stairs: Yes: Internal: 12-14 steps; on left going up and External: 4 steps; on right going up Has following equipment at home: None  OCCUPATION: works for [employer] including lifting up 25-30#, pushing & pulling  PLOF: Independent  PATIENT GOALS:  to  function without pain.   Next MD visit:  02/25/2023  OBJECTIVE:  01/09/2023 MR right hip  DIAGNOSTIC FINDINGS:  1. Right anterior labral tear. 2. Mild tendinosis of the right gluteus medius tendon insertion. 3. No acute fracture, dislocation or avascular necrosis of the hips bilaterally.  Right knee 1. Partial-thickness cartilage loss of the medial patellar facet with a small focal area of full-thickness cartilage loss and subchondral reactive marrow edema. Cartilage fissuring of the lateral patellar facet towards the patellar apex. 2. Mild fraying at the free edge of the posterior horn of the medial meniscus. No discrete medial meniscal tear.  PATIENT SURVEYS:  FOTO intake:  63%  predicted:  67%  COGNITION: Overall cognitive status: WFL    SENSATION: WFL  EDEMA:  Circumferential:  LLE: around knee 38 cm RLE: around knee 36.8 cm  MUSCLE LENGTH: Hamstrings hip flexed to 90* then ext knee: Right -20 deg Thomas test: Right -5* deg  PALPATION: Denies tenderness to palpation around knee or along ITB  LOWER EXTREMITY ROM:   PROM Right eval  Hip flexion 100* No pain  Hip extension -  5* Painful end feel  Hip abduction 30* No pain  Hip adduction   Hip internal rotation 12* Painful end feel  Hip external rotation 22* Painful end feel  Knee flexion 105* No pain  Knee extension 0*  Ankle dorsiflexion   Ankle plantarflexion   Ankle inversion   Ankle eversion    (Blank rows = not tested)  LOWER EXTREMITY MMT:  MMT Right eval  Hip flexion 5/5  Hip extension 4-/5 Pain limiting  Hip abduction 4/5 Pain limiting  Hip adduction   Hip internal rotation 4/5 Pain limiting  Hip external rotation 4/5 Pain limiting  Knee flexion 4/5 Pain limiting  Knee extension 5/5  Ankle dorsiflexion   Ankle plantarflexion   Ankle inversion   Ankle eversion    (Blank rows = not tested)  FUNCTIONAL TESTS:  Lt SLS: 30 sec Rt SLS: 20 sec  GAIT: Distance walked:  >300' Assistive device utilized: None Level of assistance: Complete Independence Comments: antalgic with decreased stance duration RLE   TODAY'S TREATMENT                                                                          DATE: 02/03/2023: Therex:  HEP instruction/performance c cues for techniques, handout provided.  Trial set performed of each for comprehension and symptom assessment.  See below for exercise list  PATIENT EDUCATION:  Education details: HEP, POC Person educated: Patient Education method: Explanation, Demonstration, Verbal cues, and Handouts Education comprehension: verbalized understanding, returned demonstration, and verbal cues required  HOME EXERCISE PROGRAM: Access Code: [email or web address] URL: [email or web address] Date: 02/03/2023 Prepared by: Vladimir Faster  Exercises - Supine Bridge with Resistance Band  - 1-2 x daily - 7 x weekly - 2-3 sets - 10 reps - 5 seconds hold - Clamshell at Wall in Hip Extension  - 1-2 x daily - 7 x weekly - 2-3 sets - 10 reps - 5 seconds hold - Sidelying Reverse Clamshell  - 1-2 x daily - 7 x weekly - 2-3 sets - 10 reps - 5 seconds hold - Supine Piriformis Stretch with Leg Straight  - 2-3 x daily - 7 x weekly - 1 sets - 3 reps - 20-30 seconds hold - Seated Piriformis Stretch  - 2-3 x daily - 7 x weekly - 1 sets - 3 reps - 20-30 seconds hold  ASSESSMENT: CLINICAL IMPRESSION: Patient is a 52 y.o. who comes to clinic with complaints of right hip & knee pain with mobility, strength and movement coordination deficits that impair their ability to perform usual daily and recreational functional activities without increase difficulty/symptoms at this time.  Patient to benefit from skilled PT services to address impairments and limitations to improve to previous level of function without restriction secondary to condition.   OBJECTIVE IMPAIRMENTS: Abnormal gait, decreased activity tolerance, decreased balance, decreased knowledge  of condition, difficulty walking, decreased ROM, decreased strength, impaired flexibility, and pain.   ACTIVITY LIMITATIONS: carrying, lifting, bending, standing, squatting, stairs, and locomotion level  PARTICIPATION LIMITATIONS: community activity and occupation  PERSONAL FACTORS: Time since onset of injury/illness/exacerbation and 3+ comorbidities: see PMH  are also affecting patient's functional outcome.   REHAB POTENTIAL: Good  CLINICAL DECISION MAKING: Stable/uncomplicated  EVALUATION COMPLEXITY: Low  GOALS: Goals reviewed with patient? Yes  STGs = LTGs  LONG TERM GOALS: (target dates for all long term goals  03/06/2023 )   1. Patient will demonstrate/report pain at worst less than or equal to 2/10 to facilitate minimal limitation in daily activity secondary to pain symptoms. Baseline: See objective data Goal status: Initial   2. Patient will demonstrate independent use of home exercise program to facilitate ability to maintain/progress functional gains from skilled physical therapy services. Baseline: See objective data Goal status: Initial   3. Patient will demonstrate FOTO outcome > or = 67 % to indicate reduced disability due to condition. Baseline: See objective data Goal status: Initial   4.  Patient will demonstrate right hip & knee MMT 5/5 throughout to faciltiate usual transfers, stairs, squatting at [facility] for daily life.  Baseline: See objective data Goal status: Initial    PLAN:  PT FREQUENCY:  2x/week  PT DURATION: 4 weeks  PLANNED INTERVENTIONS: Therapeutic exercises, Therapeutic activity, Neuro Muscular re-education, Balance training, Gait training, Patient/Family education, Joint mobilization, Stair training, DME instructions, Dry Needling, Electrical stimulation, Traction, Cryotherapy, vasopneumatic deviceMoist heat, Taping, Ultrasound, Ionotophoresis 4mg /ml Dexamethasone, and aquatic therapy, Manual therapy.  All included unless  contraindicated  PLAN FOR NEXT SESSION: Review & Update HEP.  Manual therapy for pain.     Vladimir Faster, PT, DPT 02/03/2023, 4:20 PM

## 2023-02-03 NOTE — Telephone Encounter (Signed)
Received FMLA form, $25 cash, [place]. To [employer].

## 2023-02-04 ENCOUNTER — Other Ambulatory Visit: Payer: Self-pay | Admitting: Neurology

## 2023-02-04 ENCOUNTER — Encounter: Payer: Self-pay | Admitting: Neurology

## 2023-02-04 MED ORDER — PREDNISONE 10 MG PO TABS: ORAL_TABLET | ORAL | 0 refills | Status: DC

## 2023-02-10 ENCOUNTER — Encounter: Payer: Self-pay | Admitting: Physical Therapy

## 2023-02-10 ENCOUNTER — Ambulatory Visit (INDEPENDENT_AMBULATORY_CARE_PROVIDER_SITE_OTHER): Payer: Commercial Managed Care - PPO | Admitting: Physical Therapy

## 2023-02-10 DIAGNOSIS — M25551 Pain in right hip: Secondary | ICD-10-CM

## 2023-02-10 DIAGNOSIS — M6281 Muscle weakness (generalized): Secondary | ICD-10-CM | POA: Diagnosis not present

## 2023-02-10 DIAGNOSIS — M25561 Pain in right knee: Secondary | ICD-10-CM

## 2023-02-10 DIAGNOSIS — M25651 Stiffness of right hip, not elsewhere classified: Secondary | ICD-10-CM | POA: Diagnosis not present

## 2023-02-10 NOTE — Therapy (Signed)
OUTPATIENT PHYSICAL THERAPY LOWER EXTREMITY TREATMENT   Patient Name: Valerie [name] MRN: [id number] DOB:[birth year], 52 y.o., female Today's Date: 02/10/2023  END OF SESSION:  PT End of Session - 02/10/23 1438     Visit Number 2    Number of Visits 9    Date for PT Re-Evaluation 03/06/23    Authorization Type U[employer]    Authorization Time Period 20% co-insurance, pt reports met OOP max for year    PT Start Time 1435    PT Stop Time 1514    PT Time Calculation (min) 39 min    Activity Tolerance Patient tolerated treatment well    Behavior During Therapy WFL for tasks assessed/performed              Past Medical History:  Diagnosis Date   Abnormal echocardiogram 11/28/2021   EF 60 -65%, abnormal global longitudinal strain, see cardiology OV in Epic dated 12/25/21   Breast cancer (HCC)    Cancer (HCC) 09/2020   left breast, ER+, s/p radiation therapy 12/25/20 - 01/18/21, Patient follows with Dr. Serena Croissant @ [facility].   COVID-19 11/2019   flu-like symptoms, cough, no hospitalizations   Edema    lower extremity, follows w/ [facility], Sabino Dick, [place]. See OV dated 03/24/22 in Epic.   GERD (gastroesophageal reflux disease)    Follows w/ Dr. Judeen Hammans, gastroenterologist and PCP, Dr. Maryruth Hancock [facility] @ [facility].   Hashimoto's disease    Follows with Dr. Ernest Haber @ [place].   History of hiatal hernia    2 cm hiatal hernia per 01/08/22 EGD   History of radiation therapy 2022   completed 01-18-2021   [facility] (hard of hearing)    has gotten bilateral aides.   Hypothyroidism    Follows w/ endocronologist, Dr. Ernest Haber @ .   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 Despina Arias @ [facility].   Optic neuritis    Follows w/ opthamology, Dr. Mora Appl.03/2020 right eye (almost complete blindness), 04/2020 left eye   Personal history of radiation therapy    PONV (postoperative nausea and vomiting)    Status post dilation of esophageal narrowing 01/08/2022   EGD, esophagus dilated   Wears glasses    Past Surgical History:  Procedure Laterality Date   ABDOMINAL HYSTERECTOMY  04/23/22   BIOPSY BREAST Left 10/17/2020   BREAST LUMPECTOMY     BREAST LUMPECTOMY WITH RADIOACTIVE SEED AND SENTINEL LYMPH NODE BIOPSY Left 11/15/2020   Procedure: LEFT BREAST LUMPECTOMY WITH RADIOACTIVE SEED AND LEFT SENTINEL LYMPH NODE BIOPSY;  Surgeon: Harriette Bouillon, MD;  Location: [facility];  Service: General;  Laterality: Left;   BREAST SURGERY  August 2022   Cancer surgery   COLONOSCOPY  09/06/2021   cecal polyp   CT CORONARY CA SCORING  07/13/2020   Coronary Calcium = 0 (in Epic)   CYSTOSCOPY N/A 04/23/2022   Procedure: CYSTOSCOPY;  Surgeon: Jerene Bears, MD;  Location: [facility];  Service: Gynecology;  Laterality: N/A;   ESOPHAGOGASTRODUODENOSCOPY  01/08/2022   2cm hiatal hernia, esophagus dilated   TOTAL LAPAROSCOPIC HYSTERECTOMY WITH SALPINGECTOMY Bilateral 04/23/2022   Procedure: TOTAL LAPAROSCOPIC HYSTERECTOMY BILATERAL SALPINGECTOMY, LEFT OOPHORECTOMY;  Surgeon: Jerene Bears, MD;  Location: [facility];  Service: Gynecology;  Laterality: Bilateral;   TRANSTHORACIC ECHOCARDIOGRAM  11/28/2021   see results in Epic   TRANSTHORACIC ECHOCARDIOGRAM  07/05/2020   in Epic   TUBAL LIGATION  02/2012   WISDOM TOOTH EXTRACTION     early 1990's   Patient Active Problem List   Diagnosis Date Noted   Genetic testing 07/04/2022   Family history of uterine cancer 06/24/2022   Local edema 02/25/2022   Hypothyroidism due to Hashimoto's thyroiditis 06/07/2021   Malignant neoplasm of lower-inner quadrant of left breast in female, estrogen receptor positive (HCC) 10/23/2020   Optic neuritis, left  10/18/2020   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  She went to pool (marching, sidesteps, grapevines, knee to chest, kicking legs, side stroke, back stroke) with legs achy more the day after.    PERTINENT HISTORY: Total Hysterectomy laproscopic 04/23/2022, breast CA treated radiation completed 2022, Hashimoto's disease, hiatal hernia, Myelin Oligodendrocyte Glycoprotein Antibody Disorder  PAIN:  NPRS scale: today 0/10 and over last week up to 3-4/10 Pain location: right hip lateral and knee ant lateral Pain description: quick grabbing  Aggravating factors: knee kneeling/ yoga Child's Pose, hip flexing with external rotation or doing something long period walking 15-20 minutes Relieving factors: goes away with 10-12 steps  PRECAUTIONS: None  WEIGHT BEARING RESTRICTIONS: No  FALLS:  Has patient fallen in last 6 months? No  LIVING ENVIRONMENT: Lives with: lives with their spouse and 2 cats Lives in: [place] with basement (washer/dryer & garage where she parks) Stairs: Yes: Internal: 12-14 steps; on left going up and External: 4 steps; on right going up Has following equipment at home: None  OCCUPATION: works for [employer] including lifting up 25-30#, pushing & pulling  PLOF: Independent  PATIENT GOALS:  to function without  pain.   Next MD visit:  02/25/2023  OBJECTIVE:  01/09/2023 MR right hip  DIAGNOSTIC FINDINGS:  1. Right anterior labral tear. 2. Mild tendinosis of the right gluteus medius tendon insertion. 3. No acute fracture, dislocation or avascular necrosis of the hips bilaterally.  Right knee 1. Partial-thickness cartilage loss of the medial patellar facet with a small focal area of full-thickness cartilage loss and subchondral reactive marrow edema. Cartilage fissuring of the lateral patellar facet towards the patellar apex. 2. Mild fraying at the free edge of the posterior horn of the medial meniscus. No discrete medial meniscal tear.  PATIENT SURVEYS:  FOTO intake:  63%  predicted:  67%  COGNITION: Overall cognitive status: WFL    SENSATION: WFL  EDEMA:  Circumferential:  LLE: around knee 38 cm RLE: around knee 36.8 cm  MUSCLE LENGTH: Hamstrings hip flexed to 90* then ext knee: Right -20 deg Thomas test: Right -5* deg  PALPATION: Denies tenderness to palpation around knee or along ITB  LOWER EXTREMITY ROM:   PROM Right eval  Hip flexion 100* No pain  Hip extension -5* Painful end feel  Hip abduction 30* No pain  Hip adduction   Hip internal rotation 12* Painful end feel  Hip external rotation 22* Painful end feel  Knee flexion 105* No pain  Knee extension 0*  Ankle dorsiflexion   Ankle plantarflexion   Ankle inversion   Ankle eversion    (Blank rows = not tested)  LOWER EXTREMITY MMT:  MMT Right eval  Hip flexion 5/5  Hip extension 4-/5 Pain limiting  Hip abduction 4/5 Pain limiting  Hip adduction   Hip internal rotation 4/5 Pain limiting  Hip external rotation 4/5 Pain limiting  Knee flexion 4/5 Pain limiting  Knee extension 5/5  Ankle dorsiflexion   Ankle plantarflexion   Ankle inversion   Ankle eversion    (Blank rows = not tested)  FUNCTIONAL TESTS:  Lt SLS: 30 sec Rt SLS: 20 sec  GAIT: Distance walked: >300' Assistive device  utilized: None Level of assistance: Complete Independence Comments: antalgic with decreased stance duration RLE   TODAY'S TREATMENT                                                                          DATE: 02/10/2023 Therapeutic Exercise Recumbent bike seat 5 level 3 for 8 min Standing kicks alternating LEs to add balance component: flexion 10 reps, abduction 10 reps and extension 10 reps. Squat over chair 10 reps with PT demo & verbal cues on technique Hip flexor stretch with LE in chair 20 sec hold 3 reps Tandem stance on floor 30 sec RLE in front & in back;  on foam beam 30 sec RLE in front & in back Supine Bridge with green Resistance Band  10 reps  5 seconds hold Clamshell at Wall in Hip Extension green Theraband  10 reps - 5 seconds hold Sidelying Reverse Clamshell green theraband 10 reps - 5 seconds hold Seated Piriformis Stretch 20 sec hold 2 reps PT updated HEP with HO, verbal & demo cues. Pt return demo & verbalized understanding.    02/03/2023: Therex:  HEP instruction/performance c cues for techniques, handout provided.  Trial set performed of each for comprehension and symptom assessment.  See below for exercise list  PATIENT EDUCATION:  Education details: HEP, POC Person educated: Patient Education method: Explanation, Demonstration, Verbal cues, and Handouts Education comprehension: verbalized understanding, returned demonstration, and verbal cues required  HOME EXERCISE PROGRAM: Access Code: [email or web address] Access Code: [email or web address] URL: [email or web address] Date: 02/10/2023 Prepared by: Vladimir Faster  Exercises - Supine Bridge with Resistance Band  - 1-2 x daily - 7 x weekly - 2-3 sets - 10 reps - 5 seconds hold - Clamshell at Wall in Hip Extension  - 1-2 x daily - 7 x weekly - 2-3 sets - 10 reps - 5 seconds hold - Sidelying Reverse Clamshell  - 1-2 x daily - 7 x weekly - 2-3 sets - 10 reps - 5 seconds hold - Supine Piriformis Stretch with Leg  Straight  - 2-3 x daily - 7 x weekly - 1 sets - 3 reps - 20-30 seconds hold - Seated Piriformis Stretch  - 2-3 x daily - 7 x weekly - 1 sets - 3 reps - 20-30 seconds hold - Tandem Stance  - 1 x daily - 7  x weekly - 1 sets - 2-3 reps - 30 seconds hold - Hip Flexor Stretch with Chair  - 1 x daily - 7 x weekly - 1 sets - 2-3 reps - 20-30 seconds hold - Kneeling Hip Flexor Stretch  - 1 x daily - 7 x weekly - 1 sets - 2-3 reps - 20-30 seconds hold - Standing 3-way hip Kicks near chair back  - 1 x daily - 7 x weekly - 1 sets - 10 reps - 5 seconds hold - Stand to Sit  - 1 x daily - 7 x weekly - 1 sets - 10 reps - 5 seconds hold  ASSESSMENT: CLINICAL IMPRESSION: Patient reporting slightly less pain peaks over the last week.  She continues to need to be cautious and limits her activities due to pain.  PT progressed HEP which patient appears to understand.  OBJECTIVE IMPAIRMENTS: Abnormal gait, decreased activity tolerance, decreased balance, decreased knowledge of condition, difficulty walking, decreased ROM, decreased strength, impaired flexibility, and pain.   ACTIVITY LIMITATIONS: carrying, lifting, bending, standing, squatting, stairs, and locomotion level  PARTICIPATION LIMITATIONS: community activity and occupation  PERSONAL FACTORS: Time since onset of injury/illness/exacerbation and 3+ comorbidities: see PMH  are also affecting patient's functional outcome.   REHAB POTENTIAL: Good  CLINICAL DECISION MAKING: Stable/uncomplicated  EVALUATION COMPLEXITY: Low   GOALS: Goals reviewed with patient? Yes  STGs = LTGs  LONG TERM GOALS: (target dates for all long term goals  03/06/2023 )   1. Patient will demonstrate/report pain at worst less than or equal to 2/10 to facilitate minimal limitation in daily activity secondary to pain symptoms. Baseline: See objective data Goal status: Initial   2. Patient will demonstrate independent use of home exercise program to facilitate ability to  maintain/progress functional gains from skilled physical therapy services. Baseline: See objective data Goal status: Initial   3. Patient will demonstrate FOTO outcome > or = 67 % to indicate reduced disability due to condition. Baseline: See objective data Goal status: Initial   4.  Patient will demonstrate right hip & knee MMT 5/5 throughout to faciltiate usual transfers, stairs, squatting at [facility] for daily life.  Baseline: See objective data Goal status: Initial    PLAN:  PT FREQUENCY:  2x/week  PT DURATION: 4 weeks  PLANNED INTERVENTIONS: Therapeutic exercises, Therapeutic activity, Neuro Muscular re-education, Balance training, Gait training, Patient/Family education, Joint mobilization, Stair training, DME instructions, Dry Needling, Electrical stimulation, Traction, Cryotherapy, vasopneumatic deviceMoist heat, Taping, Ultrasound, Ionotophoresis 4mg /ml Dexamethasone, and aquatic therapy, Manual therapy.  All included unless contraindicated  PLAN FOR NEXT SESSION: Check on updated HEP, exercises and manual therapy for pain & functional strength.     Vladimir Faster, PT, DPT 02/10/2023, 4:12 PM

## 2023-02-11 ENCOUNTER — Ambulatory Visit (HOSPITAL_BASED_OUTPATIENT_CLINIC_OR_DEPARTMENT_OTHER): Payer: Commercial Managed Care - PPO | Admitting: Orthopaedic Surgery

## 2023-02-12 ENCOUNTER — Encounter: Payer: Self-pay | Admitting: Physical Therapy

## 2023-02-12 ENCOUNTER — Ambulatory Visit: Payer: Commercial Managed Care - PPO | Admitting: Physical Therapy

## 2023-02-12 DIAGNOSIS — M25651 Stiffness of right hip, not elsewhere classified: Secondary | ICD-10-CM | POA: Diagnosis not present

## 2023-02-12 DIAGNOSIS — M6281 Muscle weakness (generalized): Secondary | ICD-10-CM

## 2023-02-12 DIAGNOSIS — M25551 Pain in right hip: Secondary | ICD-10-CM | POA: Diagnosis not present

## 2023-02-12 DIAGNOSIS — M25561 Pain in right knee: Secondary | ICD-10-CM

## 2023-02-12 NOTE — Therapy (Signed)
OUTPATIENT PHYSICAL THERAPY LOWER EXTREMITY TREATMENT   Patient Name: Valerie [name] MRN: [id number] DOB:1971-02-05, 52 y.o., female Today's Date: 02/12/2023  END OF SESSION:  PT End of Session - 02/12/23 1558     Visit Number 3    Number of Visits 9    Date for PT Re-Evaluation 03/06/23    Authorization Type UMR/[employer]    Authorization Time Period 20% co-insurance, pt reports met OOP max for year    PT Start Time 1555    PT Stop Time 1635    PT Time Calculation (min) 40 min    Activity Tolerance Patient tolerated treatment well    Behavior During Therapy WFL for tasks assessed/performed              Past Medical History:  Diagnosis Date   Abnormal echocardiogram 11/28/2021   EF 60 -65%, abnormal global longitudinal strain, see cardiology OV in Epic dated 12/25/21   Breast cancer (HCC)    Cancer (HCC) 09/2020   left breast, ER+, s/p radiation therapy 12/25/20 - 01/18/21, Patient follows with Dr. Serena Croissant @ [facility].   COVID-19 11/2019   flu-like symptoms, cough, no hospitalizations   Edema    lower extremity, follows w/ [facility], Sabino Dick, [place]. See OV dated 03/24/22 in Epic.   GERD (gastroesophageal reflux disease)    Follows w/ Dr. Judeen Hammans, gastroenterologist and PCP, Dr. Maryruth Hancock [facility] @ [facility].   Hashimoto's disease    Follows with Dr. Ernest Haber @ [facility].   History of hiatal hernia    2 cm hiatal hernia per 01/08/22 EGD   History of radiation therapy 2022   completed 01-18-2021   [facility] (hard of hearing)    has gotten bilateral aides.   Hypothyroidism    Follows w/ endocronologist, Dr. Ernest Haber @ [place].   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 Despina Arias @ [facility].   Optic neuritis    Follows w/ opthamology, Dr. Mora Appl.03/2020 right eye (almost complete blindness), 04/2020 left eye   Personal history of radiation therapy    PONV (postoperative nausea and vomiting)    Status post dilation of esophageal narrowing 01/08/2022   EGD, esophagus dilated   Wears glasses    Past Surgical History:  Procedure Laterality Date   ABDOMINAL HYSTERECTOMY  04/23/22   BIOPSY BREAST Left 10/17/2020   BREAST LUMPECTOMY     BREAST LUMPECTOMY WITH RADIOACTIVE SEED AND SENTINEL LYMPH NODE BIOPSY Left 11/15/2020   Procedure: LEFT BREAST LUMPECTOMY WITH RADIOACTIVE SEED AND LEFT SENTINEL LYMPH NODE BIOPSY;  Surgeon: Harriette Bouillon, MD;  Location: [facility];  Service: General;  Laterality: Left;   BREAST SURGERY  August 2022   Cancer surgery   COLONOSCOPY  09/06/2021   cecal polyp   CT CORONARY CA SCORING  07/13/2020   Coronary Calcium = 0 (in Epic)   CYSTOSCOPY N/A 04/23/2022   Procedure: CYSTOSCOPY;  Surgeon: Jerene Bears, MD;  Location: [facility];  Service: Gynecology;  Laterality: N/A;   ESOPHAGOGASTRODUODENOSCOPY  01/08/2022   2cm hiatal hernia, esophagus dilated   TOTAL LAPAROSCOPIC HYSTERECTOMY WITH SALPINGECTOMY Bilateral 04/23/2022   Procedure: TOTAL LAPAROSCOPIC HYSTERECTOMY BILATERAL SALPINGECTOMY, LEFT OOPHORECTOMY;  Surgeon: Jerene Bears, MD;  Location: [facility] [place];  Service: Gynecology;  Laterality: Bilateral;   TRANSTHORACIC ECHOCARDIOGRAM  11/28/2021   see results in Epic   TRANSTHORACIC ECHOCARDIOGRAM  07/05/2020   in Epic   TUBAL LIGATION  02/2012   WISDOM TOOTH EXTRACTION     early 1990's   Patient Active Problem List   Diagnosis Date Noted   Genetic testing 07/04/2022   Family history of uterine cancer 06/24/2022   Local edema 02/25/2022   Hypothyroidism due to Hashimoto's thyroiditis 06/07/2021   Malignant neoplasm of lower-inner quadrant of left breast in female, estrogen receptor positive (HCC) 10/23/2020   Optic neuritis, left  10/18/2020   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) 07/10/2020   Mitral regurgitation    Neuromyelitis optica (devic) (HCC) 05/07/2020   Hypothyroid 04/25/2020   Optic neuritis, right 04/24/2020    PCP: [name], MD  REFERRING PROVIDER: Huel Cote, MD  REFERRING DIAG: Q65.89 (ICD-10-CM) - Hip dysplasia   THERAPY DIAG:  Pain in right hip  Right knee pain, unspecified chronicity  Stiffness of right hip, not elsewhere classified  Muscle weakness (generalized)  Rationale for Evaluation and Treatment: Rehabilitation  ONSET DATE: 01/23/2023 MD referral to PT  SUBJECTIVE:   SUBJECTIVE STATEMENT: She has been doing the exercises, the child pose bothers her so she has stopped this one, she does feel the 3 way kicks are working her out, she does get some pain with these but it goes away afterwards  PERTINENT HISTORY: Total Hysterectomy laproscopic 04/23/2022, breast CA treated radiation completed 2022, Hashimoto's disease, hiatal hernia, Myelin Oligodendrocyte Glycoprotein Antibody Disorder  PAIN:  NPRS scale: today 3/10 overall Pain location: right hip lateral and knee ant lateral Pain description: quick grabbing  Aggravating factors: knee kneeling/ yoga Child's Pose, hip flexing with external rotation or doing something long period walking 15-20 minutes Relieving factors: goes away with 10-12 steps  PRECAUTIONS: None  WEIGHT BEARING RESTRICTIONS: No  FALLS:  Has patient fallen in last 6 months? No  LIVING ENVIRONMENT: Lives with: lives with their spouse and 2 cats Lives in: House [place] with basement (washer/dryer & garage where she parks) Stairs: Yes: Internal: 12-14 steps; on left going up and External: 4 steps; on right going up Has following equipment at home: None  OCCUPATION: works for [employer] including lifting up 25-30#, pushing & pulling  PLOF: Independent  PATIENT GOALS:  to function without pain.   Next MD  visit:  02/25/2023  OBJECTIVE:  01/09/2023 MR right hip  DIAGNOSTIC FINDINGS:  1. Right anterior labral tear. 2. Mild tendinosis of the right gluteus medius tendon insertion. 3. No acute fracture, dislocation or avascular necrosis of the hips bilaterally.  Right knee 1. Partial-thickness cartilage loss of the medial patellar facet with a small focal area of full-thickness cartilage loss and subchondral reactive marrow edema. Cartilage fissuring of the lateral patellar facet towards the patellar apex. 2. Mild fraying at the free edge of the posterior horn of the medial meniscus. No discrete medial meniscal tear.  PATIENT SURVEYS:  FOTO intake:  63%  predicted:  67%  COGNITION: Overall cognitive status: WFL    SENSATION: WFL  EDEMA:  Circumferential:  LLE: around knee 38 cm RLE: around knee 36.8 cm  MUSCLE LENGTH: Hamstrings hip flexed to 90* then ext knee: Right -20 deg Thomas test: Right -5* deg  PALPATION: Denies tenderness to palpation around knee or along ITB  LOWER EXTREMITY ROM:   PROM Right eval  Hip flexion 100* No pain  Hip extension -5* Painful end feel  Hip  abduction 30* No pain  Hip adduction   Hip internal rotation 12* Painful end feel  Hip external rotation 22* Painful end feel  Knee flexion 105* No pain  Knee extension 0*  Ankle dorsiflexion   Ankle plantarflexion   Ankle inversion   Ankle eversion    (Blank rows = not tested)  LOWER EXTREMITY MMT:  MMT Right eval  Hip flexion 5/5  Hip extension 4-/5 Pain limiting  Hip abduction 4/5 Pain limiting  Hip adduction   Hip internal rotation 4/5 Pain limiting  Hip external rotation 4/5 Pain limiting  Knee flexion 4/5 Pain limiting  Knee extension 5/5  Ankle dorsiflexion   Ankle plantarflexion   Ankle inversion   Ankle eversion    (Blank rows = not tested)  FUNCTIONAL TESTS:  Lt SLS: 30 sec Rt SLS: 20 sec  GAIT: Distance walked: >300' Assistive device utilized:  None Level of assistance: Complete Independence Comments: antalgic with decreased stance duration RLE   TODAY'S TREATMENT                                                                          DATE: 02/12/2023 Therapeutic Exercise Recumbent bike seat 5 level 3 for 8 min Leg press DL 40# [id number], SL 91# [id number] each side. Sidestepping red band around ankle 3 round trips at counter top Monster walks red band around ankle 3 round trips at counter top Supine SL bridge on Rt 2X10 Sidelying hip abduction SLR Rt 2X10 Sidelying clam red band 2X10 Supine SLR flexion 2X10 bilat Supine Piriformis Stretch 20 sec hold 2 reps for each way for Rt side Hip flexor stretch supine with leg off EOB 30 sec X 2  02/10/2023 Therapeutic Exercise Recumbent bike seat 5 level 3 for 8 min Standing kicks alternating LEs to add balance component: flexion 10 reps, abduction 10 reps and extension 10 reps. Squat over chair 10 reps with PT demo & verbal cues on technique Hip flexor stretch with LE in chair 20 sec hold 3 reps Tandem stance on floor 30 sec RLE in front & in back;  on foam beam 30 sec RLE in front & in back Supine Bridge with green Resistance Band  10 reps  5 seconds hold Clamshell at Wall in Hip Extension green Theraband  10 reps - 5 seconds hold Sidelying Reverse Clamshell green theraband 10 reps - 5 seconds hold Seated Piriformis Stretch 20 sec hold 2 reps PT updated HEP with HO, verbal & demo cues. Pt return demo & verbalized understanding.    02/03/2023: Therex:  HEP instruction/performance c cues for techniques, handout provided.  Trial set performed of each for comprehension and symptom assessment.  See below for exercise list  PATIENT EDUCATION:  Education details: HEP, POC Person educated: Patient Education method: Explanation, Demonstration, Verbal cues, and Handouts Education comprehension: verbalized understanding, returned demonstration, and verbal cues required  HOME EXERCISE  PROGRAM: Access Code: [email or web address] Access Code: [email or web address] URL: [email or web address] Date: 02/10/2023 Prepared by: Vladimir Faster  Exercises - Supine Bridge with Resistance Band  - 1-2 x daily - 7 x weekly - 2-3 sets - 10 reps - 5 seconds hold - Clamshell at Wall in Hip Extension  - 1-2 x daily - 7 x  weekly - 2-3 sets - 10 reps - 5 seconds hold - Sidelying Reverse Clamshell  - 1-2 x daily - 7 x weekly - 2-3 sets - 10 reps - 5 seconds hold - Supine Piriformis Stretch with Leg Straight  - 2-3 x daily - 7 x weekly - 1 sets - 3 reps - 20-30 seconds hold - Seated Piriformis Stretch  - 2-3 x daily - 7 x weekly - 1 sets - 3 reps - 20-30 seconds hold - Tandem Stance  - 1 x daily - 7 x weekly - 1 sets - 2-3 reps - 30 seconds hold - Hip Flexor Stretch with Chair  - 1 x daily - 7 x weekly - 1 sets - 2-3 reps - 20-30 seconds hold - Kneeling Hip Flexor Stretch  - 1 x daily - 7 x weekly - 1 sets - 2-3 reps - 20-30 seconds hold - Standing 3-way hip Kicks near chair back  - 1 x daily - 7 x weekly - 1 sets - 10 reps - 5 seconds hold - Stand to Sit  - 1 x daily - 7 x weekly - 1 sets - 10 reps - 5 seconds hold  Access Code: [email or web address] URL: [email or web address] Date: 02/12/2023 Prepared by: Ivery Quale  Exercises - Standing March at [facility]  - 1 x daily - 2 x weekly - 15 reps - Standing Hip Flexion Extension at [employer]  - 1 x daily - 2 x weekly - 15 reps - Standing Hip Abduction Adduction at Pool Wall  - 1 x daily - 2 x weekly - 20 reps - Forward Walking  - 1 x daily - 2 x weekly - 4 sets - Backward Walking  - 1 x daily - 2 x weekly - 4 sets - Side Stepping  - 2 x daily - 2 x weekly - 4 sets - 10 reps - Backward March  - 1 x daily - 2 x weekly - 4 sets - Squat  - 1 x daily - 2 x weekly - 15 reps - Lunge to Target at [employer]  - 1 x daily - 2 x weekly - 1 sets - 20 reps - Alternating Arms Jumping in Place  - 1 x daily - 2 x weekly - 2 sets - 10 reps - Lateral Hops in  Water   - 1 x daily - 2 x weekly - 1 sets - 10 reps - Forward Monster Walk  - 1 x daily - 2 x weekly - 4 sets - Standing Single Leg Hip Circles  - 1 x daily - 2 x weekly - 1 sets - 20 reps  ASSESSMENT: CLINICAL IMPRESSION: She had good overall tolerance to strengthening and stretching exercises for hips/knee. We will monitor for any soreness and adjust accordingly. I did also email her aquatic HEP to trial and we will review this further when she comes to aquatic PT session next Friday.  OBJECTIVE IMPAIRMENTS: Abnormal gait, decreased activity tolerance, decreased balance, decreased knowledge of condition, difficulty walking, decreased ROM, decreased strength, impaired flexibility, and pain.   ACTIVITY LIMITATIONS: carrying, lifting, bending, standing, squatting, stairs, and locomotion level  PARTICIPATION LIMITATIONS: community activity and occupation  PERSONAL FACTORS: Time since onset of injury/illness/exacerbation and 3+ comorbidities: see PMH  are also affecting patient's functional outcome.   REHAB POTENTIAL: Good  CLINICAL DECISION MAKING: Stable/uncomplicated  EVALUATION COMPLEXITY: Low   GOALS: Goals reviewed with patient? Yes  STGs = LTGs  LONG TERM GOALS: (target dates  for all long term goals  03/06/2023 )   1. Patient will demonstrate/report pain at worst less than or equal to 2/10 to facilitate minimal limitation in daily activity secondary to pain symptoms. Baseline: See objective data Goal status: Initial   2. Patient will demonstrate independent use of home exercise program to facilitate ability to maintain/progress functional gains from skilled physical therapy services. Baseline: See objective data Goal status: Initial   3. Patient will demonstrate FOTO outcome > or = 67 % to indicate reduced disability due to condition. Baseline: See objective data Goal status: Initial   4.  Patient will demonstrate right hip & knee MMT 5/5 throughout to faciltiate usual  transfers, stairs, squatting at [facility] for daily life.  Baseline: See objective data Goal status: Initial    PLAN:  PT FREQUENCY:  2x/week  PT DURATION: 4 weeks  PLANNED INTERVENTIONS: Therapeutic exercises, Therapeutic activity, Neuro Muscular re-education, Balance training, Gait training, Patient/Family education, Joint mobilization, Stair training, DME instructions, Dry Needling, Electrical stimulation, Traction, Cryotherapy, vasopneumatic deviceMoist heat, Taping, Ultrasound, Ionotophoresis 4mg /ml Dexamethasone, and aquatic therapy, Manual therapy.  All included unless contraindicated  PLAN FOR NEXT SESSION:  exercises and manual therapy for pain & functional strength.     April Manson, PT, DPT 02/12/2023, 3:58 PM

## 2023-02-13 ENCOUNTER — Encounter (HOSPITAL_BASED_OUTPATIENT_CLINIC_OR_DEPARTMENT_OTHER): Payer: Self-pay | Admitting: Orthopaedic Surgery

## 2023-02-16 ENCOUNTER — Encounter: Payer: Self-pay | Admitting: Physical Therapy

## 2023-02-16 ENCOUNTER — Ambulatory Visit (INDEPENDENT_AMBULATORY_CARE_PROVIDER_SITE_OTHER): Payer: Commercial Managed Care - PPO | Admitting: Physical Therapy

## 2023-02-16 DIAGNOSIS — M25651 Stiffness of right hip, not elsewhere classified: Secondary | ICD-10-CM

## 2023-02-16 DIAGNOSIS — M25551 Pain in right hip: Secondary | ICD-10-CM | POA: Diagnosis not present

## 2023-02-16 DIAGNOSIS — M6281 Muscle weakness (generalized): Secondary | ICD-10-CM

## 2023-02-16 DIAGNOSIS — M25561 Pain in right knee: Secondary | ICD-10-CM

## 2023-02-16 NOTE — Therapy (Signed)
OUTPATIENT PHYSICAL THERAPY LOWER EXTREMITY TREATMENT   Patient Name: Valerie [name] MRN: [id number] DOB:[birth year], 52 y.o., female Today's Date: 02/16/2023  END OF SESSION:  PT End of Session - 02/16/23 1510     Visit Number 4    Number of Visits 9    Date for PT Re-Evaluation 03/06/23    Authorization Type UMR/[employer]HC    Authorization Time Period 20% co-insurance, pt reports met OOP max for year    PT Start Time 1510    PT Stop Time 1548    PT Time Calculation (min) 38 min    Activity Tolerance Patient tolerated treatment well    Behavior During Therapy WFL for tasks assessed/performed               Past Medical History:  Diagnosis Date   Abnormal echocardiogram 11/28/2021   EF 60 -65%, abnormal global longitudinal strain, see cardiology OV in Epic dated 12/25/21   Breast cancer (HCC)    Cancer (HCC) 09/2020   left breast, ER+, s/p radiation therapy 12/25/20 - 01/18/21, Patient follows with Dr. Serena Croissant @ [facility].   COVID-19 11/2019   flu-like symptoms, cough, no hospitalizations   Edema    lower extremity, follows w/ [facility], Sabino Dick, [place]. See OV dated 03/24/22 in Epic.   GERD (gastroesophageal reflux disease)    Follows w/ Dr. Judeen Hammans, gastroenterologist and PCP, Dr. Maryruth Hancock [facility] @ [facility].   Hashimoto's disease    Follows with Dr. Ernest Haber @ [place].   History of hiatal hernia    2 cm hiatal hernia per 01/08/22 EGD   History of radiation therapy 2022   completed 01-18-2021   [facility] (hard of hearing)    has gotten bilateral aides.   Hypothyroidism    Follows w/ endocronologist, Dr. Ernest Haber @ [place].   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 Despina Arias @ [facility].   Optic neuritis    Follows w/ opthamology, Dr. Mora Appl.03/2020 right eye (almost complete blindness), 04/2020 left eye   Personal history of radiation therapy    PONV (postoperative nausea and vomiting)    Status post dilation of esophageal narrowing 01/08/2022   EGD, esophagus dilated   Wears glasses    Past Surgical History:  Procedure Laterality Date   ABDOMINAL HYSTERECTOMY  04/23/22   BIOPSY BREAST Left 10/17/2020   BREAST LUMPECTOMY     BREAST LUMPECTOMY WITH RADIOACTIVE SEED AND SENTINEL LYMPH NODE BIOPSY Left 11/15/2020   Procedure: LEFT BREAST LUMPECTOMY WITH RADIOACTIVE SEED AND LEFT SENTINEL LYMPH NODE BIOPSY;  Surgeon: Harriette Bouillon, MD;  Location: [facility];  Service: General;  Laterality: Left;   BREAST SURGERY  August 2022   Cancer surgery   COLONOSCOPY  09/06/2021   cecal polyp   CT CORONARY CA SCORING  07/13/2020   Coronary Calcium = 0 (in Epic)   CYSTOSCOPY N/A 04/23/2022   Procedure: CYSTOSCOPY;  Surgeon: Jerene Bears, MD;  Location: [facility];  Service: Gynecology;  Laterality: N/A;   ESOPHAGOGASTRODUODENOSCOPY  01/08/2022   2cm hiatal hernia, esophagus dilated   TOTAL LAPAROSCOPIC HYSTERECTOMY WITH SALPINGECTOMY Bilateral 04/23/2022   Procedure: TOTAL LAPAROSCOPIC HYSTERECTOMY BILATERAL SALPINGECTOMY, LEFT OOPHORECTOMY;  Surgeon: Jerene Bears, MD;  Location: [facility];  Service:  Gynecology;  Laterality: Bilateral;   TRANSTHORACIC ECHOCARDIOGRAM  11/28/2021   see results in Epic   TRANSTHORACIC ECHOCARDIOGRAM  07/05/2020   in Epic   TUBAL LIGATION  02/2012   WISDOM TOOTH EXTRACTION     early 1990's   Patient Active Problem List   Diagnosis Date Noted   Genetic testing 07/04/2022   Family history of uterine cancer 06/24/2022   Local edema 02/25/2022   Hypothyroidism due to Hashimoto's thyroiditis 06/07/2021   Malignant neoplasm of lower-inner quadrant of left breast in female, estrogen receptor positive (HCC) 10/23/2020   Optic neuritis, left  10/18/2020   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) 07/10/2020   Mitral regurgitation    Neuromyelitis optica (devic) (HCC) 05/07/2020   Hypothyroid 04/25/2020   Optic neuritis, right 04/24/2020    PCP: [name], MD  REFERRING PROVIDER: Huel Cote, MD  REFERRING DIAG: 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  The hip has had a couple of "zingers"  The band at ankle last session was difficult.    PERTINENT HISTORY: Total Hysterectomy laproscopic 04/23/2022, breast CA treated radiation completed 2022, Hashimoto's disease, hiatal hernia, Myelin Oligodendrocyte Glycoprotein Antibody Disorder  PAIN:  NPRS scale: since last PT 0/10 with no activity, 2/10 with activities, 4-5/10 with exercising Pain location: right hip lateral and knee ant lateral Pain description: quick grabbing  Aggravating factors: knee kneeling/ yoga Child's Pose, hip flexing with external rotation or doing something long period walking 15-20 minutes Relieving factors: goes away with 10-12 steps  PRECAUTIONS: None  WEIGHT BEARING RESTRICTIONS: No  FALLS:  Has patient fallen in last 6 months? No  LIVING ENVIRONMENT: Lives with: lives with their spouse and 2 cats Lives in: House ranch with basement (washer/dryer & garage where she parks) Stairs: Yes: Internal: 12-14 steps; on left going up and External: 4 steps; on right going up Has following equipment at home: None  OCCUPATION: works for [employer] including lifting up 25-30#, pushing & pulling  PLOF: Independent  PATIENT GOALS:  to function  without pain.   Next MD visit:  02/25/2023  OBJECTIVE:  01/09/2023 MR right hip  DIAGNOSTIC FINDINGS:  1. Right anterior labral tear. 2. Mild tendinosis of the right gluteus medius tendon insertion. 3. No acute fracture, dislocation or avascular necrosis of the hips bilaterally.  Right knee 1. Partial-thickness cartilage loss of the medial patellar facet with a small focal area of full-thickness cartilage loss and subchondral reactive marrow edema. Cartilage fissuring of the lateral patellar facet towards the patellar apex. 2. Mild fraying at the free edge of the posterior horn of the medial meniscus. No discrete medial meniscal tear.  PATIENT SURVEYS:  FOTO intake:  63%  predicted:  67%  COGNITION: Overall cognitive status: WFL    SENSATION: WFL  EDEMA:  Circumferential:  LLE: around knee 38 cm RLE: around knee 36.8 cm  MUSCLE LENGTH: Hamstrings hip flexed to 90* then ext knee: Right -20 deg Thomas test: Right -5* deg  PALPATION: Denies tenderness to palpation around knee or along ITB  LOWER EXTREMITY ROM:   PROM Right eval  Hip flexion 100* No pain  Hip  extension -5* Painful end feel  Hip abduction 30* No pain  Hip adduction   Hip internal rotation 12* Painful end feel  Hip external rotation 22* Painful end feel  Knee flexion 105* No pain  Knee extension 0*  Ankle dorsiflexion   Ankle plantarflexion   Ankle inversion   Ankle eversion    (Blank rows = not tested)  LOWER EXTREMITY MMT:  MMT Right eval  Hip flexion 5/5  Hip extension 4-/5 Pain limiting  Hip abduction 4/5 Pain limiting  Hip adduction   Hip internal rotation 4/5 Pain limiting  Hip external rotation 4/5 Pain limiting  Knee flexion 4/5 Pain limiting  Knee extension 5/5  Ankle dorsiflexion   Ankle plantarflexion   Ankle inversion   Ankle eversion    (Blank rows = not tested)  FUNCTIONAL TESTS:  Lt SLS: 30 sec Rt SLS: 20 sec  GAIT: Distance walked: >300' Assistive  device utilized: None Level of assistance: Complete Independence Comments: antalgic with decreased stance duration RLE   TODAY'S TREATMENT                                                                          DATE: 02/16/2023 Therapeutic Exercise PT reviewed comprehensive exercise program includes exercises to address muscle endurance, flexibility, strength & balance. Rehab or recovery requires higher frequency but ongoing should address each area 2-3x/wk.  Pt verbalized understanding.  Recumbent bike seat 5 level 3 for 8 min Leg press DL 40# [id number], SL 91# [id number] each side. Sidestepping red band around ankle 3 round trips at counter top Monster walks red band around ankle 3 round trips at counter top Giant steps forward & backward red band around ankle 3 round trips at counter top Supine bridge 10 reps, then bridge with abd/add 10 reps and bridge with marching 10 reps Sidelying hip abduction SLR Rt 2X10 Supine Piriformis Stretch 20 sec hold 2 reps for each way for Rt side Hip flexor stretch supine with leg off EOB 30 sec X 2 (1st rep hooklying & 2nd rep LLE knee to chest).    02/12/2023 Therapeutic Exercise Recumbent bike seat 5 level 3 for 8 min Leg press DL 95# [id number], SL 08# [id number] each side. Sidestepping red band around ankle 3 round trips at counter top Monster walks red band around ankle 3 round trips at counter top Supine SL bridge on Rt 2X10 Sidelying hip abduction SLR Rt 2X10 Sidelying clam red band 2X10 Supine SLR flexion 2X10 bilat Supine Piriformis Stretch 20 sec hold 2 reps for each way for Rt side Hip flexor stretch supine with leg off EOB 30 sec X 2  02/10/2023 Therapeutic Exercise Recumbent bike seat 5 level 3 for 8 min Standing kicks alternating LEs to add balance component: flexion 10 reps, abduction 10 reps and extension 10 reps. Squat over chair 10 reps with PT demo & verbal cues on technique Hip flexor stretch with LE in chair 20 sec hold 3 reps Tandem stance on  floor 30 sec RLE in front & in back;  on foam beam 30 sec RLE in front & in back Supine Bridge with green Resistance Band  10 reps  5 seconds hold Clamshell at Wall in Hip Extension green Theraband  10 reps - 5 seconds hold Sidelying Reverse Clamshell green theraband 10 reps - 5 seconds hold Seated Piriformis Stretch 20 sec hold 2 reps PT updated HEP with HO, verbal & demo cues. Pt return demo & verbalized understanding.    02/03/2023: Therex:  HEP instruction/performance c cues for techniques, handout provided.  Trial set performed of each for comprehension and symptom assessment.  See below for exercise list  PATIENT EDUCATION:  Education details: HEP, POC Person educated: Patient Education method: Explanation, Demonstration, Verbal cues, and Handouts Education comprehension: verbalized understanding, returned demonstration, and verbal cues required  HOME EXERCISE PROGRAM: Access Code: [email or web address] URL: [email or web address] Date: 02/10/2023 Prepared by: Vladimir Faster  Exercises - Supine Bridge with Resistance Band  - 1-2 x daily - 7 x weekly - 2-3 sets - 10 reps - 5 seconds hold - Clamshell at Wall in Hip Extension  - 1-2 x daily - 7 x weekly - 2-3 sets - 10 reps - 5 seconds hold - Sidelying Reverse Clamshell  - 1-2 x daily - 7 x weekly - 2-3 sets - 10 reps - 5 seconds hold - Supine Piriformis Stretch with Leg Straight  - 2-3 x daily - 7 x weekly - 1 sets - 3 reps - 20-30 seconds hold - Seated Piriformis Stretch  - 2-3 x daily - 7 x weekly - 1 sets - 3 reps - 20-30 seconds hold - Tandem Stance  - 1 x daily - 7 x weekly - 1 sets - 2-3 reps - 30 seconds hold - Hip Flexor Stretch with Chair  - 1 x daily - 7 x weekly - 1 sets - 2-3 reps - 20-30 seconds hold - Kneeling Hip Flexor Stretch  - 1 x daily - 7 x weekly - 1 sets - 2-3 reps - 20-30 seconds hold - Standing 3-way hip Kicks near chair back  - 1 x daily - 7 x weekly - 1 sets - 10 reps - 5 seconds hold - Stand to Sit  -  1 x daily - 7 x weekly - 1 sets - 10 reps - 5 seconds hold  Access Code: [email or web address] URL: [email or web address] Date: 02/12/2023 Prepared by: Ivery Quale  Exercises - Standing March at [facility]  - 1 x daily - 2 x weekly - 15 reps - Standing Hip Flexion Extension at [employer]  - 1 x daily - 2 x weekly - 15 reps - Standing Hip Abduction Adduction at Pool Wall  - 1 x daily - 2 x weekly - 20 reps - Forward Walking  - 1 x daily - 2 x weekly - 4 sets - Backward Walking  - 1 x daily - 2 x weekly - 4 sets - Side Stepping  - 2 x daily - 2 x weekly - 4 sets - 10 reps - Backward March  - 1 x daily - 2 x weekly - 4 sets - Squat  - 1 x daily - 2 x weekly - 15 reps - Lunge to Target at [employer]  - 1 x daily - 2 x weekly - 1 sets - 20 reps - Alternating Arms Jumping in Place  - 1 x daily - 2 x weekly - 2 sets - 10 reps - Lateral Hops in  Water  - 1 x daily - 2 x weekly - 1 sets - 10 reps - Forward Monster Walk  - 1 x daily - 2 x weekly - 4 sets - Standing Single Leg Hip Circles  - 1  x daily - 2 x weekly - 1 sets - 20 reps  ASSESSMENT: CLINICAL IMPRESSION: Patient is improving understanding of well-rounded comprehensive exercise program.  She continues to be limited by pain with stretches & resistive type exercises.    OBJECTIVE IMPAIRMENTS: Abnormal gait, decreased activity tolerance, decreased balance, decreased knowledge of condition, difficulty walking, decreased ROM, decreased strength, impaired flexibility, and pain.   ACTIVITY LIMITATIONS: carrying, lifting, bending, standing, squatting, stairs, and locomotion level  PARTICIPATION LIMITATIONS: community activity and occupation  PERSONAL FACTORS: Time since onset of injury/illness/exacerbation and 3+ comorbidities: see PMH  are also affecting patient's functional outcome.   REHAB POTENTIAL: Good  CLINICAL DECISION MAKING: Stable/uncomplicated  EVALUATION COMPLEXITY: Low   GOALS: Goals reviewed with patient? Yes  STGs =  LTGs  LONG TERM GOALS: (target dates for all long term goals  03/06/2023 )   1. Patient will demonstrate/report pain at worst less than or equal to 2/10 to facilitate minimal limitation in daily activity secondary to pain symptoms. Baseline: See objective data Goal status: Ongoing 02/16/2023   2. Patient will demonstrate independent use of home exercise program to facilitate ability to maintain/progress functional gains from skilled physical therapy services. Baseline: See objective data Goal status: Ongoing 02/16/2023   3. Patient will demonstrate FOTO outcome > or = 67 % to indicate reduced disability due to condition. Baseline: See objective data Goal status: Ongoing 02/16/2023   4.  Patient will demonstrate right hip & knee MMT 5/5 throughout to faciltiate usual transfers, stairs, squatting at [facility] for daily life.  Baseline: See objective data Goal status: Ongoing 02/16/2023    PLAN:  PT FREQUENCY:  2x/week  PT DURATION: 4 weeks  PLANNED INTERVENTIONS: Therapeutic exercises, Therapeutic activity, Neuro Muscular re-education, Balance training, Gait training, Patient/Family education, Joint mobilization, Stair training, DME instructions, Dry Needling, Electrical stimulation, Traction, Cryotherapy, vasopneumatic deviceMoist heat, Taping, Ultrasound, Ionotophoresis 4mg /ml Dexamethasone, and aquatic therapy, Manual therapy.  All included unless contraindicated  PLAN FOR NEXT SESSION:  aquatic therapy next session, then next in clinic appt check LTGs.    Vladimir Faster, PT, DPT 02/16/2023, 3:50 PM

## 2023-02-20 ENCOUNTER — Ambulatory Visit (INDEPENDENT_AMBULATORY_CARE_PROVIDER_SITE_OTHER): Payer: Commercial Managed Care - PPO | Admitting: Physical Therapy

## 2023-02-20 ENCOUNTER — Encounter: Payer: Self-pay | Admitting: Physical Therapy

## 2023-02-20 DIAGNOSIS — M25651 Stiffness of right hip, not elsewhere classified: Secondary | ICD-10-CM | POA: Diagnosis not present

## 2023-02-20 DIAGNOSIS — M25551 Pain in right hip: Secondary | ICD-10-CM

## 2023-02-20 DIAGNOSIS — M25561 Pain in right knee: Secondary | ICD-10-CM | POA: Diagnosis not present

## 2023-02-20 DIAGNOSIS — M6281 Muscle weakness (generalized): Secondary | ICD-10-CM | POA: Diagnosis not present

## 2023-02-20 NOTE — Therapy (Addendum)
OUTPATIENT PHYSICAL THERAPY LOWER EXTREMITY TREATMENT   Patient Name: Valerie [name] MRN: [id number] DOB:[birth year], 52 y.o., female Today's Date: 02/20/2023  END OF SESSION:  PT End of Session - 02/20/23 0809     Visit Number 5    Number of Visits 9    Date for PT Re-Evaluation 03/06/23    Authorization Type UMR/[employer]HC    Authorization Time Period 20% co-insurance, pt reports met OOP max for year    PT Start Time 0803    PT Stop Time 0845    PT Time Calculation (min) 42 min    Activity Tolerance Patient tolerated treatment well    Behavior During Therapy [facility] for tasks assessed/performed               Past Medical History:  Diagnosis Date   Abnormal echocardiogram 11/28/2021   EF 60 -65%, abnormal global longitudinal strain, see cardiology OV in Epic dated 12/25/21   Breast cancer (HCC)    Cancer (HCC) 09/2020   left breast, ER+, s/p radiation therapy 12/25/20 - 01/18/21, Patient follows with Dr. Serena Croissant @ [facility].   COVID-19 11/2019   flu-like symptoms, cough, no hospitalizations   Edema    lower extremity, follows w/ [facility], Sabino Dick, [place]. See OV dated 03/24/22 in Epic.   GERD (gastroesophageal reflux disease)    Follows w/ Dr. Judeen Hammans, gastroenterologist and PCP, Dr. Maryruth Hancock [facility] @ [facility].   Hashimoto's disease    Follows with Dr. Ernest Haber @ [place].   History of hiatal hernia    2 cm hiatal hernia per 01/08/22 EGD   History of radiation therapy 2022   completed 01-18-2021   [facility] (hard of hearing)    has gotten bilateral aides.   Hypothyroidism    Follows w/ endocronologist, Dr. Ernest Haber @ .   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 Despina Arias @ [facility].   Optic neuritis    Follows w/ opthamology, Dr. Mora Appl.03/2020 right eye (almost complete blindness), 04/2020 left eye   Personal history of radiation therapy    PONV (postoperative nausea and vomiting)    Status post dilation of esophageal narrowing 01/08/2022   EGD, esophagus dilated   Wears glasses    Past Surgical History:  Procedure Laterality Date   ABDOMINAL HYSTERECTOMY  04/23/22   BIOPSY BREAST Left 10/17/2020   BREAST LUMPECTOMY     BREAST LUMPECTOMY WITH RADIOACTIVE SEED AND SENTINEL LYMPH NODE BIOPSY Left 11/15/2020   Procedure: LEFT BREAST LUMPECTOMY WITH RADIOACTIVE SEED AND LEFT SENTINEL LYMPH NODE BIOPSY;  Surgeon: Harriette Bouillon, MD;  Location:  SURGERY CENTER;  Service: General;  Laterality: Left;   BREAST SURGERY  August 2022   Cancer surgery   COLONOSCOPY  09/06/2021   cecal polyp   CT CORONARY CA SCORING  07/13/2020   Coronary Calcium = 0 (in Epic)   CYSTOSCOPY N/A 04/23/2022   Procedure: CYSTOSCOPY;  Surgeon: Jerene Bears, MD;  Location: [facility];  Service: Gynecology;  Laterality: N/A;   ESOPHAGOGASTRODUODENOSCOPY  01/08/2022   2cm hiatal hernia, esophagus dilated   TOTAL LAPAROSCOPIC HYSTERECTOMY WITH SALPINGECTOMY Bilateral 04/23/2022   Procedure: TOTAL LAPAROSCOPIC HYSTERECTOMY BILATERAL SALPINGECTOMY, LEFT OOPHORECTOMY;  Surgeon: Jerene Bears, MD;  Location: [facility];  Service:  Gynecology;  Laterality: Bilateral;   TRANSTHORACIC ECHOCARDIOGRAM  11/28/2021   see results in Epic   TRANSTHORACIC ECHOCARDIOGRAM  07/05/2020   in Epic   TUBAL LIGATION  02/2012   WISDOM TOOTH EXTRACTION     early 1990's   Patient Active Problem List   Diagnosis Date Noted   Genetic testing 07/04/2022   Family history of uterine cancer 06/24/2022   Local edema 02/25/2022   Hypothyroidism due to Hashimoto's thyroiditis 06/07/2021   Malignant neoplasm of lower-inner quadrant of left breast in female, estrogen receptor positive (HCC) 10/23/2020   Optic neuritis, left  10/18/2020   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) 07/10/2020   Mitral regurgitation    Neuromyelitis optica (devic) (HCC) 05/07/2020   Hypothyroid 04/25/2020   Optic neuritis, right 04/24/2020    PCP: [name], MD  REFERRING PROVIDER: Huel Cote, MD  REFERRING DIAG: Q65.89 (ICD-10-CM) - Hip dysplasia   THERAPY DIAG:  Pain in right hip  Right knee pain, unspecified chronicity  Stiffness of right hip, not elsewhere classified  Muscle weakness (generalized)  Rationale for Evaluation and Treatment: Rehabilitation  ONSET DATE: 01/23/2023 MD referral to PT  SUBJECTIVE:   SUBJECTIVE STATEMENT: Pain is doing okay overall, had some neck pain with bridges, may have not been relaxing her neck with the exercise.  PERTINENT HISTORY: Total Hysterectomy laproscopic 04/23/2022, breast CA treated radiation completed 2022, Hashimoto's disease, hiatal hernia, Myelin Oligodendrocyte Glycoprotein Antibody Disorder  PAIN:  NPRS scale:, 2/10 today Pain location: right hip lateral and knee ant lateral Pain description: quick grabbing  Aggravating factors: knee kneeling/ yoga Child's Pose, hip flexing with external rotation or doing something long period walking 15-20 minutes Relieving factors: goes away with 10-12 steps  PRECAUTIONS: None  WEIGHT BEARING RESTRICTIONS: No  FALLS:  Has patient fallen in last 6 months? No  LIVING ENVIRONMENT: Lives with: lives with their spouse and 2 cats Lives in: House [place] with basement (washer/dryer & garage where she parks) Stairs: Yes: Internal: 12-14 steps; on left going up and External: 4 steps; on right going up Has following equipment at home: None  OCCUPATION: works for [employer] including lifting up 25-30#, pushing & pulling  PLOF: Independent  PATIENT GOALS:  to function without pain.   Next MD visit:  02/25/2023  OBJECTIVE:  01/09/2023 MR right hip  DIAGNOSTIC FINDINGS:  1. Right  anterior labral tear. 2. Mild tendinosis of the right gluteus medius tendon insertion. 3. No acute fracture, dislocation or avascular necrosis of the hips bilaterally.  Right knee 1. Partial-thickness cartilage loss of the medial patellar facet with a small focal area of full-thickness cartilage loss and subchondral reactive marrow edema. Cartilage fissuring of the lateral patellar facet towards the patellar apex. 2. Mild fraying at the free edge of the posterior horn of the medial meniscus. No discrete medial meniscal tear.  PATIENT SURVEYS:  FOTO intake:  63%  predicted:  67%  COGNITION: Overall cognitive status: WFL    SENSATION: WFL  EDEMA:  Circumferential:  LLE: around knee 38 cm RLE: around knee 36.8 cm  MUSCLE LENGTH: Hamstrings hip flexed to 90* then ext knee: Right -20 deg Thomas test: Right -5* deg  PALPATION: Denies tenderness to palpation around knee or along ITB  LOWER EXTREMITY ROM:   PROM Right eval  Hip flexion 100* No pain  Hip extension -5* Painful end feel  Hip abduction 30* No pain  Hip adduction   Hip internal rotation 12* Painful end feel  Hip  external rotation 22* Painful end feel  Knee flexion 105* No pain  Knee extension 0*  Ankle dorsiflexion   Ankle plantarflexion   Ankle inversion   Ankle eversion    (Blank rows = not tested)  LOWER EXTREMITY MMT:  MMT Right eval  Hip flexion 5/5  Hip extension 4-/5 Pain limiting  Hip abduction 4/5 Pain limiting  Hip adduction   Hip internal rotation 4/5 Pain limiting  Hip external rotation 4/5 Pain limiting  Knee flexion 4/5 Pain limiting  Knee extension 5/5  Ankle dorsiflexion   Ankle plantarflexion   Ankle inversion   Ankle eversion    (Blank rows = not tested)  FUNCTIONAL TESTS:  Lt SLS: 30 sec Rt SLS: 20 sec  GAIT: Distance walked: >300' Assistive device utilized: None Level of assistance: Complete Independence Comments: antalgic with decreased stance duration  RLE   TODAY'S TREATMENT                                                                          DATE: 02/20/23 Pt seen for aquatic therapy today.  Treatment took place in water 3.5-4.75 ft in depth at the [facility] pool. Temp of water was 91.  Pt entered/exited the pool via stairs with hand rail.   Pt requires the buoyancy and hydrostatic pressure of water for support, and to offload joints by unweighting joint load by at least 50 % in navel deep water and by at least 75-80% in chest to neck deep water.  Viscosity of the water is needed for resistance of strengthening. Water current perturbations provides challenge to standing balance requiring increased core activation. Aquatic PT exercises Forward walking  3 round trips backward walking 3 round trips Sidestepping 3 round trips Tandem walk 3 round trips Leg swings hip abd/add X 20 bilat with UE support with UE support Leg swings hip flexion/extension X 20 bilat March without UE support X 15 bilat Push pull with kickboard X 15 bilat Bilat shoulder extension with kickboard X 15 Lunge step and touch pool wall with hands then step back X 15 bilat scissor hops in deeper water with UE support X 10 bilat Hip abd to add hops in deeper water with  Squats on first step of pool X15 bilat with UE support Hip circles CW and CCW X 15 each bilat Seated on pool chair and cycling and hip abd/add 3 min  02/16/2023 Therapeutic Exercise PT reviewed comprehensive exercise program includes exercises to address muscle endurance, flexibility, strength & balance. Rehab or recovery requires higher frequency but ongoing should address each area 2-3x/wk.  Pt verbalized understanding.  Recumbent bike seat 5 level 3 for 8 min Leg press DL 29# [id number], SL 30# [id number] each side. Sidestepping red band around ankle 3 round trips at counter top Monster walks red band around ankle 3 round trips at counter top Giant steps forward & backward red band around ankle 3  round trips at counter top Supine bridge 10 reps, then bridge with abd/add 10 reps and bridge with marching 10 reps Sidelying hip abduction SLR Rt 2X10 Supine Piriformis Stretch 20 sec hold 2 reps for each way for Rt side Hip flexor stretch supine with leg off EOB 30 sec X 2 (  1st rep hooklying & 2nd rep LLE knee to chest).    02/12/2023 Therapeutic Exercise Recumbent bike seat 5 level 3 for 8 min Leg press DL 82# [id number], SL 13# [id number] each side. Sidestepping red band around ankle 3 round trips at counter top Monster walks red band around ankle 3 round trips at counter top Supine SL bridge on Rt 2X10 Sidelying hip abduction SLR Rt 2X10 Sidelying clam red band 2X10 Supine SLR flexion 2X10 bilat Supine Piriformis Stretch 20 sec hold 2 reps for each way for Rt side Hip flexor stretch supine with leg off EOB 30 sec X 2  02/10/2023 Therapeutic Exercise Recumbent bike seat 5 level 3 for 8 min Standing kicks alternating LEs to add balance component: flexion 10 reps, abduction 10 reps and extension 10 reps. Squat over chair 10 reps with PT demo & verbal cues on technique Hip flexor stretch with LE in chair 20 sec hold 3 reps Tandem stance on floor 30 sec RLE in front & in back;  on foam beam 30 sec RLE in front & in back Supine Bridge with green Resistance Band  10 reps  5 seconds hold Clamshell at Wall in Hip Extension green Theraband  10 reps - 5 seconds hold Sidelying Reverse Clamshell green theraband 10 reps - 5 seconds hold Seated Piriformis Stretch 20 sec hold 2 reps PT updated HEP with HO, verbal & demo cues. Pt return demo & verbalized understanding.    02/03/2023: Therex:  HEP instruction/performance c cues for techniques, handout provided.  Trial set performed of each for comprehension and symptom assessment.  See below for exercise list  PATIENT EDUCATION:  Education details: HEP, POC Person educated: Patient Education method: Explanation, Demonstration, Verbal cues, and  Handouts Education comprehension: verbalized understanding, returned demonstration, and verbal cues required  HOME EXERCISE PROGRAM: Access Code: [email or web address] URL: [email or web address] Date: 02/10/2023 Prepared by: Vladimir Faster  Exercises - Supine Bridge with Resistance Band  - 1-2 x daily - 7 x weekly - 2-3 sets - 10 reps - 5 seconds hold - Clamshell at Wall in Hip Extension  - 1-2 x daily - 7 x weekly - 2-3 sets - 10 reps - 5 seconds hold - Sidelying Reverse Clamshell  - 1-2 x daily - 7 x weekly - 2-3 sets - 10 reps - 5 seconds hold - Supine Piriformis Stretch with Leg Straight  - 2-3 x daily - 7 x weekly - 1 sets - 3 reps - 20-30 seconds hold - Seated Piriformis Stretch  - 2-3 x daily - 7 x weekly - 1 sets - 3 reps - 20-30 seconds hold - Tandem Stance  - 1 x daily - 7 x weekly - 1 sets - 2-3 reps - 30 seconds hold - Hip Flexor Stretch with Chair  - 1 x daily - 7 x weekly - 1 sets - 2-3 reps - 20-30 seconds hold - Kneeling Hip Flexor Stretch  - 1 x daily - 7 x weekly - 1 sets - 2-3 reps - 20-30 seconds hold - Standing 3-way hip Kicks near chair back  - 1 x daily - 7 x weekly - 1 sets - 10 reps - 5 seconds hold - Stand to Sit  - 1 x daily - 7 x weekly - 1 sets - 10 reps - 5 seconds hold  Access Code: [email or web address] URL: [email or web address] Date: 02/12/2023 Prepared by: Ivery Quale  Exercises - Standing March at [facility]  - 1 x daily - 2 x weekly -  15 reps - Standing Hip Flexion Extension at [employer]  - 1 x daily - 2 x weekly - 15 reps - Standing Hip Abduction Adduction at [employer]  - 1 x daily - 2 x weekly - 20 reps - Forward Walking  - 1 x daily - 2 x weekly - 4 sets - Backward Walking  - 1 x daily - 2 x weekly - 4 sets - Side Stepping  - 2 x daily - 2 x weekly - 4 sets - 10 reps - Backward March  - 1 x daily - 2 x weekly - 4 sets - Squat  - 1 x daily - 2 x weekly - 15 reps - Lunge to Target at [employer]  - 1 x daily - 2 x weekly - 1 sets - 20 reps -  Alternating Arms Jumping in Place  - 1 x daily - 2 x weekly - 2 sets - 10 reps - Lateral Hops in  Water  - 1 x daily - 2 x weekly - 1 sets - 10 reps - Forward Monster Walk  - 1 x daily - 2 x weekly - 4 sets - Standing Single Leg Hip Circles  - 1 x daily - 2 x weekly - 1 sets - 20 reps  ASSESSMENT: CLINICAL IMPRESSION: She had her first aquatic PT session today with good overall tolerance noted and showed good understanding of aquatic PT HEP which was printed and laminated for her.   OBJECTIVE IMPAIRMENTS: Abnormal gait, decreased activity tolerance, decreased balance, decreased knowledge of condition, difficulty walking, decreased ROM, decreased strength, impaired flexibility, and pain.   ACTIVITY LIMITATIONS: carrying, lifting, bending, standing, squatting, stairs, and locomotion level  PARTICIPATION LIMITATIONS: community activity and occupation  PERSONAL FACTORS: Time since onset of injury/illness/exacerbation and 3+ comorbidities: see PMH  are also affecting patient's functional outcome.   REHAB POTENTIAL: Good  CLINICAL DECISION MAKING: Stable/uncomplicated  EVALUATION COMPLEXITY: Low   GOALS: Goals reviewed with patient? Yes  STGs = LTGs  LONG TERM GOALS: (target dates for all long term goals  03/06/2023 )   1. Patient will demonstrate/report pain at worst less than or equal to 2/10 to facilitate minimal limitation in daily activity secondary to pain symptoms. Baseline: See objective data Goal status: Ongoing 02/16/2023   2. Patient will demonstrate independent use of home exercise program to facilitate ability to maintain/progress functional gains from skilled physical therapy services. Baseline: See objective data Goal status: Ongoing 02/16/2023   3. Patient will demonstrate FOTO outcome > or = 67 % to indicate reduced disability due to condition. Baseline: See objective data Goal status: Ongoing 02/16/2023   4.  Patient will demonstrate right hip & knee MMT 5/5  throughout to faciltiate usual transfers, stairs, squatting at [facility] for daily life.  Baseline: See objective data Goal status: Ongoing 02/16/2023    PLAN:  PT FREQUENCY:  2x/week  PT DURATION: 4 weeks  PLANNED INTERVENTIONS: Therapeutic exercises, Therapeutic activity, Neuro Muscular re-education, Balance training, Gait training, Patient/Family education, Joint mobilization, Stair training, DME instructions, Dry Needling, Electrical stimulation, Traction, Cryotherapy, vasopneumatic deviceMoist heat, Taping, Ultrasound, Ionotophoresis 4mg /ml Dexamethasone, and aquatic therapy, Manual therapy.  All included unless contraindicated  PLAN FOR NEXT SESSION:  next in clinic appt check LTGs.    April Manson, PT, DPT 02/20/2023, 8:10 AM

## 2023-02-21 ENCOUNTER — Encounter: Payer: Self-pay | Admitting: Internal Medicine

## 2023-02-23 ENCOUNTER — Encounter: Payer: Self-pay | Admitting: Physical Therapy

## 2023-02-23 ENCOUNTER — Ambulatory Visit (INDEPENDENT_AMBULATORY_CARE_PROVIDER_SITE_OTHER): Payer: Commercial Managed Care - PPO | Admitting: Physical Therapy

## 2023-02-23 DIAGNOSIS — M6281 Muscle weakness (generalized): Secondary | ICD-10-CM

## 2023-02-23 DIAGNOSIS — M25551 Pain in right hip: Secondary | ICD-10-CM

## 2023-02-23 DIAGNOSIS — M25651 Stiffness of right hip, not elsewhere classified: Secondary | ICD-10-CM | POA: Diagnosis not present

## 2023-02-23 DIAGNOSIS — M25561 Pain in right knee: Secondary | ICD-10-CM

## 2023-02-23 NOTE — Therapy (Signed)
OUTPATIENT PHYSICAL THERAPY LOWER EXTREMITY TREATMENT   Patient Name: Valerie [name] MRN: [id number] DOB:1970/09/10, 52 y.o., female Today's Date: 02/23/2023  END OF SESSION:  PT End of Session - 02/23/23 1513     Visit Number 6    Number of Visits 9    Date for PT Re-Evaluation 03/06/23    Authorization Type UMR/UHC    Authorization Time Period 20% co-insurance, pt reports met OOP max for year    PT Start Time 1514    PT Stop Time 1538    PT Time Calculation (min) 24 min    Activity Tolerance Patient tolerated treatment well    Behavior During Therapy WFL for tasks assessed/performed               Past Medical History:  Diagnosis Date   Abnormal echocardiogram 11/28/2021   EF 60 -65%, abnormal global longitudinal strain, see cardiology OV in Epic dated 12/25/21   Breast cancer (HCC)    Cancer (HCC) 09/2020   left breast, ER+, s/p radiation therapy 12/25/20 - 01/18/21, Patient follows with Dr. Serena Croissant @ [facility].   COVID-19 11/2019   flu-like symptoms, cough, no hospitalizations   Edema    lower extremity, follows w/ [facility], Sabino Dick, [place]. See OV dated 03/24/22 in Epic.   GERD (gastroesophageal reflux disease)    Follows w/ Dr. Judeen Hammans, gastroenterologist and PCP, Dr. [facility] @ [facility].   Hashimoto's disease    Follows with Dr. Ernest Haber @ [place].   History of hiatal hernia    2 cm hiatal hernia per 01/08/22 EGD   History of radiation therapy 2022   completed 01-18-2021   [facility] (hard of hearing)    has gotten bilateral aides.   Hypothyroidism    Follows w/ endocronologist, Dr. Ernest Haber @ [place].   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 Despina Arias @ [facility].   Optic neuritis    Follows w/ opthamology, Dr. Mora Appl.03/2020 right eye (almost complete blindness), 04/2020 left eye   Personal history of radiation therapy    PONV (postoperative nausea and vomiting)    Status post dilation of esophageal narrowing 01/08/2022   EGD, esophagus dilated   Wears glasses    Past Surgical History:  Procedure Laterality Date   ABDOMINAL HYSTERECTOMY  04/23/22   BIOPSY BREAST Left 10/17/2020   BREAST LUMPECTOMY     BREAST LUMPECTOMY WITH RADIOACTIVE SEED AND SENTINEL LYMPH NODE BIOPSY Left 11/15/2020   Procedure: LEFT BREAST LUMPECTOMY WITH RADIOACTIVE SEED AND LEFT SENTINEL LYMPH NODE BIOPSY;  Surgeon: Harriette Bouillon, MD;  Location: [facility];  Service: General;  Laterality: Left;   BREAST SURGERY  August 2022   Cancer surgery   COLONOSCOPY  09/06/2021   cecal polyp   CT CORONARY CA SCORING  07/13/2020   Coronary Calcium = 0 (in Epic)   CYSTOSCOPY N/A 04/23/2022   Procedure: CYSTOSCOPY;  Surgeon: Jerene Bears, MD;  Location: [facility];  Service: Gynecology;  Laterality: N/A;   ESOPHAGOGASTRODUODENOSCOPY  01/08/2022   2cm hiatal hernia, esophagus dilated   TOTAL LAPAROSCOPIC HYSTERECTOMY WITH SALPINGECTOMY Bilateral 04/23/2022   Procedure: TOTAL LAPAROSCOPIC HYSTERECTOMY BILATERAL SALPINGECTOMY, LEFT OOPHORECTOMY;  Surgeon: Jerene Bears, MD;  Location: [facility];  Service:  Gynecology;  Laterality: Bilateral;   TRANSTHORACIC ECHOCARDIOGRAM  11/28/2021   see results in Epic   TRANSTHORACIC ECHOCARDIOGRAM  07/05/2020   in Epic   TUBAL LIGATION  02/2012   WISDOM TOOTH EXTRACTION     early 1990's   Patient Active Problem List   Diagnosis Date Noted   Genetic testing 07/04/2022   Family history of uterine cancer 06/24/2022   Local edema 02/25/2022   Hypothyroidism due to Hashimoto's thyroiditis 06/07/2021   Malignant neoplasm of lower-inner quadrant of left breast in female, estrogen receptor positive (HCC) 10/23/2020   Optic neuritis, left  10/18/2020   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  She has been walking over mile with no pain increase.  She did the stretches.  Riding in the car makes the hip hurt and standing up (hip ext) causes spike in pain. The pain is better as long as she can exercise daily multiple times. If not the pain is right back.   PERTINENT HISTORY: Total Hysterectomy laproscopic 04/23/2022, breast CA treated radiation completed 2022, Hashimoto's disease, hiatal hernia, Myelin Oligodendrocyte Glycoprotein Antibody Disorder  PAIN:  NPRS scale:, 2/10 today and over last week 0/10 - 4/10 Pain location: right hip lateral and knee ant lateral Pain description: quick grabbing  Aggravating factors: knee kneeling/ yoga Child's Pose, hip flexing with external rotation or doing something long period walking 15-20 minutes Relieving factors: goes away with 10-12 steps  PRECAUTIONS: None  WEIGHT BEARING RESTRICTIONS: No  FALLS:  Has patient fallen in last 6 months? No  LIVING ENVIRONMENT: Lives with: lives with their spouse and 2 cats Lives in: House ranch with basement (washer/dryer & garage where she parks) Stairs: Yes: Internal: 12-14 steps; on left going up and External: 4 steps; on right going up Has following equipment at home: None  OCCUPATION: works for [employer]  including lifting up 25-30#, pushing & pulling  PLOF: Independent  PATIENT GOALS:  to function without pain.   Next MD visit:  02/25/2023  OBJECTIVE:  01/09/2023 MR right hip  DIAGNOSTIC FINDINGS:  1. Right anterior labral tear. 2. Mild tendinosis of the right gluteus medius tendon insertion. 3. No acute fracture, dislocation or avascular necrosis of the hips bilaterally.  Right knee 1. Partial-thickness cartilage loss of the medial patellar facet with a small focal area of full-thickness cartilage loss and subchondral reactive marrow edema. Cartilage fissuring of the lateral patellar facet towards the patellar apex. 2. Mild fraying at the free edge of the posterior horn of the medial meniscus. No discrete medial meniscal tear.  PATIENT SURVEYS:  02/23/2023: visit 6 61%  FOTO intake:  63%  predicted:  67%  COGNITION: Overall cognitive status: WFL    SENSATION: WFL  EDEMA:  Circumferential:  LLE: around knee 38 cm RLE: around knee 36.8 cm  MUSCLE LENGTH: Hamstrings hip flexed to 90* then ext knee: Right -20 deg Thomas test: Right -5* deg  PALPATION: Denies tenderness to palpation around knee or along ITB  LOWER EXTREMITY ROM:   PROM Right eval Right 02/23/23  Hip flexion 100* No pain Supine  112* No pain  Hip extension -5* Painful end feel Sidelying 6* No pain  Hip abduction 30* No pain Supine 41* Pain end feel  Hip adduction    Hip internal rotation 12* Painful end feel Seated 22* Light end pain  Hip external rotation 22* Painful end feel Seated 28* Light end pain  Knee flexion 105* No pain   Knee extension 0*   Ankle dorsiflexion    Ankle plantarflexion    Ankle inversion    Ankle eversion     (Blank rows = not tested)  LOWER EXTREMITY MMT:  MMT Right eval Right 02/23/23  Hip flexion 5/5 5/5  Hip extension 4-/5 Pain limiting 4/5  Pain limiting  Hip abduction 4/5 Pain limiting 4/5  Pain limiting  Hip adduction    Hip  internal rotation 4/5 Pain limiting 4/5  Pain limiting  Hip external rotation 4/5 Pain limiting 4/5  Pain limiting  Knee flexion 4/5 Pain limiting 4/5  Pain limiting  Knee extension 5/5   Ankle dorsiflexion    Ankle plantarflexion    Ankle inversion    Ankle eversion     (Blank rows = not tested)  FUNCTIONAL TESTS:  Lt SLS: 30 sec Rt SLS: 20 sec  GAIT: Distance walked: >300' Assistive device utilized: None Level of assistance: Complete Independence Comments: antalgic with decreased stance duration RLE   TODAY'S TREATMENT                                                                          DATE: 02/23/2023 Therapeutic Exercise: Recumbent bike seat 5 level 3 for 8 min See objective data   02/20/23 Pt seen for aquatic therapy today.  Treatment took place in water 3.5-4.75 ft in depth at the [facility] pool. Temp of water was 91.  Pt entered/exited the pool via stairs with hand rail.   Pt requires the buoyancy and hydrostatic pressure of water for support, and to offload joints by unweighting joint load by at least 50 % in navel deep water and by at least 75-80% in chest to neck deep water.  Viscosity of the water is needed for resistance of strengthening. Water current perturbations provides challenge to standing balance requiring increased core activation. Aquatic PT exercises Forward walking  3 round trips backward walking 3 round trips Sidestepping 3 round trips Tandem walk 3 round trips Leg swings hip abd/add X 20 bilat with UE support with UE support Leg swings hip flexion/extension X 20 bilat March without UE support X 15 bilat Push pull with kickboard X 15 bilat Bilat shoulder extension with kickboard X 15 Lunge step and touch pool wall with hands then step back X 15 bilat scissor hops in deeper water with UE support X 10 bilat Hip abd to add hops in deeper water with  Squats on first step of pool X15 bilat with UE support Hip circles CW and CCW X  15 each bilat Seated on pool chair and cycling and hip abd/add 3 min  02/16/2023 Therapeutic Exercise PT reviewed comprehensive exercise program includes exercises  to address muscle endurance, flexibility, strength & balance. Rehab or recovery requires higher frequency but ongoing should address each area 2-3x/wk.  Pt verbalized understanding.  Recumbent bike seat 5 level 3 for 8 min Leg press DL 40# [id number], SL 91# [id number] each side. Sidestepping red band around ankle 3 round trips at counter top Monster walks red band around ankle 3 round trips at counter top Giant steps forward & backward red band around ankle 3 round trips at counter top Supine bridge 10 reps, then bridge with abd/add 10 reps and bridge with marching 10 reps Sidelying hip abduction SLR Rt 2X10 Supine Piriformis Stretch 20 sec hold 2 reps for each way for Rt side Hip flexor stretch supine with leg off EOB 30 sec X 2 (1st rep hooklying & 2nd rep LLE knee to chest).    02/12/2023 Therapeutic Exercise Recumbent bike seat 5 level 3 for 8 min Leg press DL 95# [id number], SL 08# [id number] each side. Sidestepping red band around ankle 3 round trips at counter top Monster walks red band around ankle 3 round trips at counter top Supine SL bridge on Rt 2X10 Sidelying hip abduction SLR Rt 2X10 Sidelying clam red band 2X10 Supine SLR flexion 2X10 bilat Supine Piriformis Stretch 20 sec hold 2 reps for each way for Rt side Hip flexor stretch supine with leg off EOB 30 sec X 2  02/10/2023 Therapeutic Exercise Recumbent bike seat 5 level 3 for 8 min Standing kicks alternating LEs to add balance component: flexion 10 reps, abduction 10 reps and extension 10 reps. Squat over chair 10 reps with PT demo & verbal cues on technique Hip flexor stretch with LE in chair 20 sec hold 3 reps Tandem stance on floor 30 sec RLE in front & in back;  on foam beam 30 sec RLE in front & in back Supine Bridge with green Resistance Band  10 reps  5 seconds  hold Clamshell at Wall in Hip Extension green Theraband  10 reps - 5 seconds hold Sidelying Reverse Clamshell green theraband 10 reps - 5 seconds hold Seated Piriformis Stretch 20 sec hold 2 reps PT updated HEP with HO, verbal & demo cues. Pt return demo & verbalized understanding.    02/03/2023: Therex:  HEP instruction/performance c cues for techniques, handout provided.  Trial set performed of each for comprehension and symptom assessment.  See below for exercise list  PATIENT EDUCATION:  Education details: HEP, POC Person educated: Patient Education method: Explanation, Demonstration, Verbal cues, and Handouts Education comprehension: verbalized understanding, returned demonstration, and verbal cues required  HOME EXERCISE PROGRAM: Access Code: [email or web address] URL: [email or web address] Date: 02/10/2023 Prepared by: Vladimir Faster  Exercises - Supine Bridge with Resistance Band  - 1-2 x daily - 7 x weekly - 2-3 sets - 10 reps - 5 seconds hold - Clamshell at Wall in Hip Extension  - 1-2 x daily - 7 x weekly - 2-3 sets - 10 reps - 5 seconds hold - Sidelying Reverse Clamshell  - 1-2 x daily - 7 x weekly - 2-3 sets - 10 reps - 5 seconds hold - Supine Piriformis Stretch with Leg Straight  - 2-3 x daily - 7 x weekly - 1 sets - 3 reps - 20-30 seconds hold - Seated Piriformis Stretch  - 2-3 x daily - 7 x weekly - 1 sets - 3 reps - 20-30 seconds hold - Tandem Stance  - 1 x daily - 7 x weekly - 1 sets - 2-3 reps - 30  seconds hold - Hip Flexor Stretch with Chair  - 1 x daily - 7 x weekly - 1 sets - 2-3 reps - 20-30 seconds hold - Kneeling Hip Flexor Stretch  - 1 x daily - 7 x weekly - 1 sets - 2-3 reps - 20-30 seconds hold - Standing 3-way hip Kicks near chair back  - 1 x daily - 7 x weekly - 1 sets - 10 reps - 5 seconds hold - Stand to Sit  - 1 x daily - 7 x weekly - 1 sets - 10 reps - 5 seconds hold  Access Code: [email or web address] URL: [email or web address] Date:  02/12/2023 Prepared by: Ivery Quale  Exercises - Standing March at [facility]  - 1 x daily - 2 x weekly - 15 reps - Standing Hip Flexion Extension at [employer]  - 1 x daily - 2 x weekly - 15 reps - Standing Hip Abduction Adduction at Pool Wall  - 1 x daily - 2 x weekly - 20 reps - Forward Walking  - 1 x daily - 2 x weekly - 4 sets - Backward Walking  - 1 x daily - 2 x weekly - 4 sets - Side Stepping  - 2 x daily - 2 x weekly - 4 sets - 10 reps - Backward March  - 1 x daily - 2 x weekly - 4 sets - Squat  - 1 x daily - 2 x weekly - 15 reps - Lunge to Target at [employer]  - 1 x daily - 2 x weekly - 1 sets - 20 reps - Alternating Arms Jumping in Place  - 1 x daily - 2 x weekly - 2 sets - 10 reps - Lateral Hops in  Water  - 1 x daily - 2 x weekly - 1 sets - 10 reps - Forward Monster Walk  - 1 x daily - 2 x weekly - 4 sets - Standing Single Leg Hip Circles  - 1 x daily - 2 x weekly - 1 sets - 20 reps  ASSESSMENT: CLINICAL IMPRESSION: Patient appears compliant with HEP which she understands land based exercises.  She would benefit from one more aquatic therapy session to ensure understanding of aquatic HEP.  She continues to have pain limiting her functional activities. She improves when doing her exercises but has pain afterwards that limits activities.  PT was trial to see if conservative care would work and avoid surgery.  She does not appear to have responded to conservative management of her hip pain.   OBJECTIVE IMPAIRMENTS: Abnormal gait, decreased activity tolerance, decreased balance, decreased knowledge of condition, difficulty walking, decreased ROM, decreased strength, impaired flexibility, and pain.   ACTIVITY LIMITATIONS: carrying, lifting, bending, standing, squatting, stairs, and locomotion level  PARTICIPATION LIMITATIONS: community activity and occupation  PERSONAL FACTORS: Time since onset of injury/illness/exacerbation and 3+ comorbidities: see PMH  are also affecting  patient's functional outcome.   REHAB POTENTIAL: Good  CLINICAL DECISION MAKING: Stable/uncomplicated  EVALUATION COMPLEXITY: Low   GOALS: Goals reviewed with patient? Yes  STGs = LTGs  LONG TERM GOALS: (target dates for all long term goals  03/06/2023 )   1. Patient will demonstrate/report pain at worst less than or equal to 2/10 to facilitate minimal limitation in daily activity secondary to pain symptoms. Baseline: patient reports pain up to 4/10 which is worse after she uses hip muscles or sits long times (like work or riding in car) then stands up extending her hip. Goal status:  NOT MET 02/23/2023   2. Patient will demonstrate independent use of home exercise program to facilitate ability to maintain/progress functional gains from skilled physical therapy services. Baseline: See objective data  Goal status: Ongoing 02/23/2023   3. Patient will demonstrate FOTO outcome > or = 67 % to indicate reduced disability due to condition. Baseline: NOT MET FOTO 61% at discharge Goal status: NOT MET 02/23/2023   4.  Patient will demonstrate right hip & knee MMT 5/5 throughout to faciltiate usual transfers, stairs, squatting at [facility] for daily life.  Baseline: See objective data & pain continues to limit functional strength Goal status: NOT MET 02/23/2023    PLAN:  PT FREQUENCY:  2x/week  PT DURATION: 4 weeks  PLANNED INTERVENTIONS: Therapeutic exercises, Therapeutic activity, Neuro Muscular re-education, Balance training, Gait training, Patient/Family education, Joint mobilization, Stair training, DME instructions, Dry Needling, Electrical stimulation, Traction, Cryotherapy, vasopneumatic deviceMoist heat, Taping, Ultrasound, Ionotophoresis 4mg /ml Dexamethasone, and aquatic therapy, Manual therapy.  All included unless contraindicated  PLAN FOR NEXT SESSION:  check remaining LTG of HEP in regards to aquatic program and discharge.     Vladimir Faster, PT, DPT 02/23/2023, 4:06  PM

## 2023-02-25 ENCOUNTER — Ambulatory Visit (HOSPITAL_BASED_OUTPATIENT_CLINIC_OR_DEPARTMENT_OTHER): Payer: Commercial Managed Care - PPO | Admitting: Orthopaedic Surgery

## 2023-02-25 ENCOUNTER — Other Ambulatory Visit (HOSPITAL_BASED_OUTPATIENT_CLINIC_OR_DEPARTMENT_OTHER): Payer: Self-pay

## 2023-02-25 DIAGNOSIS — S73191A Other sprain of right hip, initial encounter: Secondary | ICD-10-CM

## 2023-02-25 MED ORDER — IBUPROFEN 800 MG PO TABS
800.0000 mg | ORAL_TABLET | Freq: Three times a day (TID) | ORAL | 0 refills | Status: AC
  Filled 2023-02-25: qty 30, 10d supply, fill #0

## 2023-02-25 MED ORDER — ACETAMINOPHEN 500 MG PO TABS
500.0000 mg | ORAL_TABLET | Freq: Three times a day (TID) | ORAL | 0 refills | Status: AC
  Filled 2023-02-25: qty 30, 10d supply, fill #0

## 2023-02-25 MED ORDER — OXYCODONE HCL 5 MG PO TABS
5.0000 mg | ORAL_TABLET | ORAL | 0 refills | Status: DC | PRN
  Filled 2023-02-25: qty 10, 2d supply, fill #0

## 2023-02-25 MED ORDER — ASPIRIN 325 MG PO TBEC
325.0000 mg | DELAYED_RELEASE_TABLET | Freq: Every day | ORAL | 0 refills | Status: DC
  Filled 2023-02-25: qty 30, 30d supply, fill #0

## 2023-02-25 NOTE — Progress Notes (Signed)
Chief Complaint: Right hip, right knee pain     History of Present Illness:   02/25/2023: Presents today for follow-up of her right hip and right knee.  Overall she is feeling much better with physical therapy with regard to the right knee.  Her hip still has remained symptomatic and painful through most activities of daily living like simply walking longer distances.  This has not improved with therapy.  Valerie [name] is a 52 y.o. female presents today with right hip and knee pain which has been ongoing for clear to me that a twisting type injury in June of this year when she was getting out of a plane seat.  Since that time she has had lateral based knee pain.  She did initially have pain with putting weight on this.  With regard to right hip she has been having pain persistently for the last several years.  She has not previously had any physical therapy for this.  Does have some pain deep in the hip joint in the anterolateral hip  She has trialed anti-inflammatories before.  She has previously had significant reaction to steroids and is essentially not able to take these due to a demyelinating condition   Surgical History:   None  PMH/PSH/Family History/Social History/Meds/Allergies:    Past Medical History:  Diagnosis Date   Abnormal echocardiogram 11/28/2021   EF 60 -65%, abnormal global longitudinal strain, see cardiology OV in Epic dated 12/25/21   Breast cancer (HCC)    Cancer (HCC) 09/2020   left breast, ER+, s/p radiation therapy 12/25/20 - 01/18/21, Patient follows with Dr. Serena Croissant @ [facility].   COVID-19 11/2019   flu-like symptoms, cough, no hospitalizations   Edema    lower extremity, follows w/ [facility], Sabino Dick, [place]. See OV dated 03/24/22 in Epic.   GERD (gastroesophageal reflux disease)    Follows w/ Dr. Judeen Hammans, gastroenterologist and PCP, Dr. Maryruth Hancock [facility] @ [facility].   Hashimoto's  disease    Follows with Dr. Ernest Haber @ [place].   History of hiatal hernia    2 cm hiatal hernia per 01/08/22 EGD   History of radiation therapy 2022   completed 01-18-2021   [facility] (hard of hearing)    has gotten bilateral aides.   Hypothyroidism    Follows w/ endocronologist, Dr. Ernest Haber @ [place].   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 Despina Arias @ [facility].   Optic neuritis    Follows w/ opthamology, Dr. Mora Appl.03/2020 right eye (almost complete blindness), 04/2020 left eye   Personal history of radiation therapy    PONV (postoperative nausea and vomiting)    Status post dilation of esophageal narrowing 01/08/2022   EGD, esophagus dilated   Wears glasses    Past Surgical History:  Procedure Laterality Date   ABDOMINAL HYSTERECTOMY  04/23/22   BIOPSY BREAST Left 10/17/2020   BREAST LUMPECTOMY     BREAST LUMPECTOMY WITH RADIOACTIVE SEED AND SENTINEL LYMPH NODE BIOPSY Left 11/15/2020   Procedure: LEFT BREAST LUMPECTOMY WITH RADIOACTIVE SEED AND LEFT SENTINEL LYMPH NODE BIOPSY;  Surgeon: Harriette Bouillon, MD;  Location: [facility];  Service: General;  Laterality: Left;   BREAST SURGERY  August 2022   Cancer surgery   COLONOSCOPY  09/06/2021   cecal polyp   CT CORONARY CA SCORING  07/13/2020   Coronary Calcium = 0 (in Epic)   CYSTOSCOPY N/A 04/23/2022   Procedure: CYSTOSCOPY;  Surgeon: Jerene Bears, MD;  Location: [facility];  Service: Gynecology;  Laterality: N/A;   ESOPHAGOGASTRODUODENOSCOPY  01/08/2022   2cm hiatal hernia, esophagus dilated   TOTAL LAPAROSCOPIC HYSTERECTOMY WITH SALPINGECTOMY Bilateral 04/23/2022   Procedure: TOTAL LAPAROSCOPIC HYSTERECTOMY BILATERAL SALPINGECTOMY, LEFT OOPHORECTOMY;  Surgeon: Jerene Bears, MD;  Location[facility];  Service: Gynecology;  Laterality: Bilateral;   TRANSTHORACIC ECHOCARDIOGRAM  11/28/2021   see results in Epic   TRANSTHORACIC ECHOCARDIOGRAM  07/05/2020   in Epic   TUBAL LIGATION  02/2012   WISDOM TOOTH EXTRACTION     early 1990's   Social History   Socioeconomic History   Marital status: Married    Spouse name: Not on file   Number of children: 2   Years of education: BS   Highest education level: Not on file  Occupational History   Occupation: Psychologist, occupational   Occupation: Shipping and receiving clerk  Tobacco Use   Smoking status: Never   Smokeless tobacco: Not on file  Vaping Use   Vaping status: Never Used  Substance and Sexual Activity   Alcohol use: Never   Drug use: Never   Sexual activity: Yes    Birth control/protection: Surgical, None    Comment: hysterectomy  Other Topics Concern   Not on file  Social History Narrative   Right handed    Caffeine use: rare   Social Determinants of Health   Financial Resource Strain: Low Risk  (03/19/2021)   Overall Financial Resource Strain (CARDIA)    Difficulty of Paying Living Expenses: Not hard at all  Food Insecurity: Low Risk  (10/21/2022)   [facility]   Hunger Vital Sign    Worried About Running Out of Food in the Last Year: Never true    Ran Out of Food in the Last Year: Never true  Transportation Needs: No Transportation Needs (03/19/2021)   PRAPARE - Administrator, Civil Service (Medical): No    Lack of Transportation (Non-Medical): No  Physical Activity: Inactive (03/19/2021)   Exercise Vital Sign    Days of Exercise per Week: 0 days    Minutes of Exercise per Session: 0 min  Stress: No Stress Concern Present (03/19/2021)   [employer] of Occupational Health - Occupational Stress Questionnaire    Feeling of Stress : Not at all  Social Connections: Socially Integrated (03/19/2021)   Social Connection and Isolation Panel [NHANES]    Frequency  of Communication with Friends and Family: More than three times a week    Frequency of Social Gatherings with Friends and Family: Three times a week    Attends Religious Services: More than 4 times per year    Active Member of Clubs or Organizations: Yes    Attends Banker Meetings: 1 to 4 times per year    Marital Status: Married   Family History  Problem Relation Age of Onset   Hearing loss Mother    Thyroid disease Mother    Cancer Mother 44       endometrial cancer, lynch negative   GER disease Mother    Other  Mother        uterine fibroids, optic disc abnormality    Eczema Mother    Colon polyps Mother    Hypothyroidism Mother    Osteoarthritis Mother    Hypertension Mother    Migraines Mother    Colon polyps Father    Hearing loss Father    Other Father        Colon adenoma   Hyperlipidemia Father    GER disease Father    Heart murmur Father    Amblyopia Father        Right eye   Neuropathy Father    Glaucoma Maternal Grandmother    Macular degeneration Maternal Grandmother    Arthritis Maternal Grandmother    Multiple sclerosis Paternal Grandmother    Arthritis Maternal Grandfather    Heart disease Paternal Grandfather    Diabetes Maternal Aunt    Breast cancer Neg Hx    Colon cancer Neg Hx    Esophageal cancer Neg Hx    Rectal cancer Neg Hx    Stomach cancer Neg Hx    No Known Allergies Current Outpatient Medications  Medication Sig Dispense Refill   acetaminophen (TYLENOL) 500 MG tablet Take 1 tablet (500 mg total) by mouth every 8 (eight) hours for 10 days. 30 tablet 0   aspirin EC 325 MG tablet Take 1 tablet (325 mg total) by mouth daily. 30 tablet 0   ibuprofen (ADVIL) 800 MG tablet Take 1 tablet (800 mg total) by mouth every 8 (eight) hours for 10 days. Please take with food, please alternate with acetaminophen 30 tablet 0   oxyCODONE (ROXICODONE) 5 MG immediate release tablet Take 1 tablet (5 mg total) by mouth every 4 (four) hours as  needed for severe pain (pain score 7-10) or breakthrough pain. 10 tablet 0   ascorbic acid (VITAMIN C) 1000 MG tablet Take 1 tablet by mouth daily.     cholecalciferol (VITAMIN D3) 25 MCG (1000 UNIT) tablet Take 5,000 Units by mouth daily.     furosemide (LASIX) 20 MG tablet TAKE 1 TABLET BY MOUTH DAILY 90 tablet 3   levothyroxine (SYNTHROID) 88 MCG tablet Take by mouth 88 mcg six days a week 75 tablet 3   MAGNESIUM GLUCONATE PO Take 2 tablets by mouth daily at 12 noon.     omeprazole (PRILOSEC) 20 MG capsule Take 1 capsule (20 mg total) by mouth daily. TAKE 1 CAPSULE BY MOUTH DAILY 90 capsule 3   predniSONE (DELTASONE) 10 MG tablet Take 6 po on day one and taper down daily to 1 po on day 6 21 tablet 0   Probiotic Product (CULTRELLE KIDS IMMUNE DEFENSE PO) Take 1 tablet by mouth daily.     Tocilizumab (ACTEMRA) 162 MG/0.9ML SOSY Inject 0.9 mLs (162 mg total) into the skin once a week. For MOGAD Takes weekly on Saturday. 10.8 mL 3   UNABLE TO FIND Take 4 tablets by mouth daily. Med Name: Omega Pure 2 tablet in AM and 1 tablet in PM     UNABLE TO FIND Take by mouth daily. Med Name: Ultra Inflamix 2 scoops daily     UNABLE TO FIND Take 2 tablets by mouth daily. Med Name: R-Lipoic Acid     No current facility-administered medications for this visit.   No results found.  Review of Systems:   A ROS was performed including pertinent positives and negatives as documented in the HPI.  Physical Exam :   Constitutional: NAD and appears stated age  Neurological: Alert and oriented Psych: Appropriate affect and cooperative Last menstrual period 12/23/2021.   Comprehensive Musculoskeletal Exam:    Positive FADIR about the right hip with 30 degrees internal and 50 degrees external rotation.  Active flexion is to 90 degrees of the right hip.  No palpable clicking about the right hip.  Positive tenderness about the lateral joint line of the right knee.  Range of motion is from -3-135.  Positive  McMurray laterally.  Imaging:   Xray (4 views right knee, 4 views right hip): There is a significant component of dysplasia of the right hip as well as a normal right knee  MRI right hip, MRI right knee: Partial-thickness chondral loss in the medial and central aspect of the patella.  There is a right hip anterior superior labral tear I personally reviewed and interpreted the radiographs.   Assessment:   52 y.o. female with evidence of right hip instability which I do believe is exacerbating her patellofemoral type pain.  Overall her MRI does show evidence of an anterior superior labral tear.  I did discuss that I do believe that therapy would continue to be helpful for the knee but this has not been helping her with regard to the hip.  She does have evidence of instability and side effect we did discuss right hip arthroscopy with possible labral repair.  I did discuss restrictions limitations.  I did discuss the associated rehab.  After discussion she would like to proceed with this. Plan :    -Plan for right hip arthroscopy with labral repair   After a lengthy discussion of treatment options, including risks, benefits, alternatives, complications of surgical and nonsurgical conservative options, the patient elected surgical repair.   The patient  is aware of the material risks  and complications including, but not limited to injury to adjacent structures, neurovascular injury, infection, numbness, bleeding, implant failure, thermal burns, stiffness, persistent pain, failure to heal, disease transmission from allograft, need for further surgery, dislocation, anesthetic risks, blood clots, risks of death,and others. The probabilities of surgical success and failure discussed with patient given their particular co-morbidities.The time and nature of expected rehabilitation and recovery was discussed.The patient's questions were all answered preoperatively.  No barriers to understanding were  noted. I explained the natural history of the disease process and Rx rationale.  I explained to the patient what I considered to be reasonable expectations given their personal situation.  The final treatment plan was arrived at through a shared patient decision making process model.      I personally saw and evaluated the patient, and participated in the management and treatment plan.  Huel Cote, MD Attending Physician, Orthopedic Surgery  This document was dictated using Dragon voice recognition software. A reasonable attempt at proof reading has been made to minimize errors.

## 2023-02-25 NOTE — Addendum Note (Signed)
Addended by: Jeanella Cara on: 02/25/2023 11:47 AM   Modules accepted: Orders

## 2023-02-27 ENCOUNTER — Ambulatory Visit (INDEPENDENT_AMBULATORY_CARE_PROVIDER_SITE_OTHER): Payer: Commercial Managed Care - PPO | Admitting: Physical Therapy

## 2023-02-27 ENCOUNTER — Encounter: Payer: Self-pay | Admitting: Physical Therapy

## 2023-02-27 DIAGNOSIS — M25651 Stiffness of right hip, not elsewhere classified: Secondary | ICD-10-CM | POA: Diagnosis not present

## 2023-02-27 DIAGNOSIS — M25561 Pain in right knee: Secondary | ICD-10-CM

## 2023-02-27 DIAGNOSIS — M25551 Pain in right hip: Secondary | ICD-10-CM

## 2023-02-27 DIAGNOSIS — M6281 Muscle weakness (generalized): Secondary | ICD-10-CM

## 2023-02-27 NOTE — Therapy (Signed)
OUTPATIENT PHYSICAL THERAPY LOWER EXTREMITY TREATMENT/Discharge PHYSICAL THERAPY DISCHARGE SUMMARY  Visits from Start of Care: 7  Current functional level related to goals / functional outcomes: See below   Remaining deficits: See below   Education / Equipment: HEP  Plan:  Patient goals were not met. Patient is being discharged from PT as MD is recommending surgery for her.      Patient Name: Valerie [name] MRN: [id number] DOB:08/22/1970, 52 y.o., female Today's Date: 02/27/2023  END OF SESSION:  PT End of Session - 02/27/23 0808     Visit Number 7    Number of Visits 9    Date for PT Re-Evaluation 03/06/23    Authorization Type UMR/UHC    Authorization Time Period 20% co-insurance, pt reports met OOP max for year    PT Start Time 0759    PT Stop Time 0837    PT Time Calculation (min) 38 min    Activity Tolerance Patient tolerated treatment well    Behavior During Therapy [facility] for tasks assessed/performed               Past Medical History:  Diagnosis Date   Abnormal echocardiogram 11/28/2021   EF 60 -65%, abnormal global longitudinal strain, see cardiology OV in Epic dated 12/25/21   Breast cancer (HCC)    Cancer (HCC) 09/2020   left breast, ER+, s/p radiation therapy 12/25/20 - 01/18/21, Patient follows with Dr. Serena Croissant @ [facility].   COVID-19 11/2019   flu-like symptoms, cough, no hospitalizations   Edema    lower extremity, follows w/ [facility], Sabino Dick, [place]. See OV dated 03/24/22 in Epic.   GERD (gastroesophageal reflux disease)    Follows w/ Dr. Judeen Hammans, gastroenterologist and PCP, Dr. [facility]ruth [facility] @ [facility].   Hashimoto's disease    Follows with Dr. Ernest Haber @ [place].   History of hiatal hernia    2 cm hiatal hernia per 01/08/22 EGD   History of radiation therapy 2022   completed 01-18-2021   [facility] (hard of hearing)    has gotten bilateral aides.   Hypothyroidism    Follows w/  endocronologist, Dr. Ernest Haber @ [place].   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 Despina Arias @ [facility].   Optic neuritis    Follows w/ opthamology, Dr. Mora Appl.03/2020 right eye (almost complete blindness), 04/2020 left eye   Personal history of radiation therapy    PONV (postoperative nausea and vomiting)    Status post dilation of esophageal narrowing 01/08/2022   EGD, esophagus dilated   Wears glasses    Past Surgical History:  Procedure Laterality Date   ABDOMINAL HYSTERECTOMY  04/23/22   BIOPSY BREAST Left 10/17/2020   BREAST LUMPECTOMY     BREAST LUMPECTOMY WITH RADIOACTIVE SEED AND SENTINEL LYMPH NODE BIOPSY Left 11/15/2020   Procedure: LEFT BREAST LUMPECTOMY WITH RADIOACTIVE SEED AND LEFT SENTINEL LYMPH NODE BIOPSY;  Surgeon: Harriette Bouillon, MD;  Location: [facility];  Service: General;  Laterality: Left;   BREAST SURGERY  August 2022   Cancer surgery   COLONOSCOPY  09/06/2021   cecal polyp   CT CORONARY CA SCORING  07/13/2020   Coronary Calcium = 0 (in Epic)   CYSTOSCOPY N/A 04/23/2022   Procedure: CYSTOSCOPY;  Surgeon: Valentina Shaggy  S, MD;  Location: [facility];  Service: Gynecology;  Laterality: N/A;   ESOPHAGOGASTRODUODENOSCOPY  01/08/2022   2cm hiatal hernia, esophagus dilated   TOTAL LAPAROSCOPIC HYSTERECTOMY WITH SALPINGECTOMY Bilateral 04/23/2022   Procedure: TOTAL LAPAROSCOPIC HYSTERECTOMY BILATERAL SALPINGECTOMY, LEFT OOPHORECTOMY;  Surgeon: Jerene Bears, MD;  Location: [facility];  Service: Gynecology;  Laterality: Bilateral;   TRANSTHORACIC ECHOCARDIOGRAM  11/28/2021   see results in Epic   TRANSTHORACIC ECHOCARDIOGRAM  07/05/2020   in Epic   TUBAL LIGATION  02/2012   WISDOM TOOTH EXTRACTION     early 1990's   Patient Active  Problem List   Diagnosis Date Noted   Genetic testing 07/04/2022   Family history of uterine cancer 06/24/2022   Local edema 02/25/2022   Hypothyroidism due to Hashimoto's thyroiditis 06/07/2021   Malignant neoplasm of lower-inner quadrant of left breast in female, estrogen receptor positive (HCC) 10/23/2020   Optic neuritis, left 10/18/2020   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) 07/10/2020   Mitral regurgitation    Neuromyelitis optica (devic) (HCC) 05/07/2020   Hypothyroid 04/25/2020   Optic neuritis, right 04/24/2020    PCP: [name], MD  REFERRING PROVIDER: Huel Cote, MD  REFERRING DIAG: 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  PERTINENT HISTORY: Total Hysterectomy laproscopic 04/23/2022, breast CA treated radiation completed 2022, Hashimoto's disease, hiatal hernia, Myelin Oligodendrocyte Glycoprotein Antibody Disorder  PAIN:  NPRS scale:, 2/10 today and over last week 0/10 - 4/10 Pain location: right hip lateral and knee ant lateral Pain description: quick grabbing  Aggravating factors: knee kneeling/ yoga Child's Pose, hip flexing with external rotation or doing something long period walking 15-20 minutes Relieving factors: goes away with 10-12 steps  PRECAUTIONS: None  WEIGHT BEARING RESTRICTIONS: No  FALLS:  Has patient fallen in last 6 months? No  LIVING ENVIRONMENT: Lives with: lives with their spouse and 2 cats Lives in: House ranch with basement (washer/dryer & garage where she parks) Stairs: Yes: Internal: 12-14 steps; on left going up  and External: 4 steps; on right going up Has following equipment at home: None  OCCUPATION: works for [employer] including lifting up 25-30#, pushing & pulling  PLOF: Independent  PATIENT GOALS:  to function without pain.   Next MD visit:  02/25/2023  OBJECTIVE:  01/09/2023 MR right hip  DIAGNOSTIC FINDINGS:  1. Right anterior labral tear. 2. Mild tendinosis of the right gluteus medius tendon insertion. 3. No acute fracture, dislocation or avascular necrosis of the hips bilaterally.  Right knee 1. Partial-thickness cartilage loss of the medial patellar facet with a small focal area of full-thickness cartilage loss and subchondral reactive marrow edema. Cartilage fissuring of the lateral patellar facet towards the patellar apex. 2. Mild fraying at the free edge of the posterior horn of the medial meniscus. No discrete medial meniscal tear.  PATIENT SURVEYS:  02/23/2023: visit 6 61%  FOTO intake:  63%  predicted:  67%  COGNITION: Overall cognitive status: WFL    SENSATION: WFL  EDEMA:  Circumferential:  LLE: around knee 38 cm RLE: around knee 36.8 cm  MUSCLE LENGTH: Hamstrings hip flexed  to 90* then ext knee: Right -20 deg Maisie Fus test: Right -5* deg  PALPATION: Denies tenderness to palpation around knee or along ITB  LOWER EXTREMITY ROM:   PROM Right eval Right 02/23/23  Hip flexion 100* No pain Supine  112* No pain  Hip extension -5* Painful end feel Sidelying 6* No pain  Hip abduction 30* No pain Supine 41* Pain end feel  Hip adduction    Hip internal rotation 12* Painful end feel Seated 22* Light end pain  Hip external rotation 22* Painful end feel Seated 28* Light end pain  Knee flexion 105* No pain   Knee extension 0*   Ankle dorsiflexion    Ankle plantarflexion    Ankle inversion    Ankle eversion     (Blank rows = not tested)  LOWER EXTREMITY MMT:  MMT Right eval Right 02/23/23  Hip flexion 5/5 5/5  Hip extension  4-/5 Pain limiting 4/5  Pain limiting  Hip abduction 4/5 Pain limiting 4/5  Pain limiting  Hip adduction    Hip internal rotation 4/5 Pain limiting 4/5  Pain limiting  Hip external rotation 4/5 Pain limiting 4/5  Pain limiting  Knee flexion 4/5 Pain limiting 4/5  Pain limiting  Knee extension 5/5   Ankle dorsiflexion    Ankle plantarflexion    Ankle inversion    Ankle eversion     (Blank rows = not tested)  FUNCTIONAL TESTS:  Lt SLS: 30 sec Rt SLS: 20 sec  GAIT: Distance walked: >300' Assistive device utilized: None Level of assistance: Complete Independence Comments: antalgic with decreased stance duration RLE   TODAY'S TREATMENT                                                                          DATE: 02/27/23 Pt seen for aquatic therapy today.  Treatment took place in water 3.5-4.75 ft in depth at the [facility] pool. Temp of water was 91.  Pt entered/exited the pool via stairs with hand rail.   Pt requires the buoyancy and hydrostatic pressure of water for support, and to offload joints by unweighting joint load by at least 50 % in navel deep water and by at least 75-80% in chest to neck deep water.  Viscosity of the water is needed for resistance of strengthening. Water current perturbations provides challenge to standing balance requiring increased core activation. Aquatic PT exercises Forward walking  3 round trips backward walking 3 round trips Sidestepping 3 round trips Tandem walk 3 round trips Leg swings hip abd/add X 20 bilat with UE support with UE support Leg swings hip flexion/extension X 20 bilat March walking 3 round trips Push pull with kickboard X 15 bilat Bilat shoulder extension with kickboard X 15 Lunge step and touch pool wall with hands then step back X 15 bilat scissor hops in deeper water with UE support X 10 bilat Hip abd to add hops in deeper water with  March hops X 15 with UE support  Hip circles CW and CCW X 15 each  bilat Seated on pool chair and cycling and hip abd/add 3 min  02/23/2023 Therapeutic Exercise: Recumbent bike seat 5 level 3 for 8 min See objective data   PATIENT  EDUCATION:  Education details: HEP, POC Person educated: Patient Education method: Programmer, multimedia, Demonstration, Verbal cues, and Handouts Education comprehension: verbalized understanding, returned demonstration, and verbal cues required  HOME EXERCISE PROGRAM: Access Code: [email or web address] URL: [email or web address] Date: 02/10/2023 Prepared by: Vladimir Faster  Exercises - Supine Bridge with Resistance Band  - 1-2 x daily - 7 x weekly - 2-3 sets - 10 reps - 5 seconds hold - Clamshell at Wall in Hip Extension  - 1-2 x daily - 7 x weekly - 2-3 sets - 10 reps - 5 seconds hold - Sidelying Reverse Clamshell  - 1-2 x daily - 7 x weekly - 2-3 sets - 10 reps - 5 seconds hold - Supine Piriformis Stretch with Leg Straight  - 2-3 x daily - 7 x weekly - 1 sets - 3 reps - 20-30 seconds hold - Seated Piriformis Stretch  - 2-3 x daily - 7 x weekly - 1 sets - 3 reps - 20-30 seconds hold - Tandem Stance  - 1 x daily - 7 x weekly - 1 sets - 2-3 reps - 30 seconds hold - Hip Flexor Stretch with Chair  - 1 x daily - 7 x weekly - 1 sets - 2-3 reps - 20-30 seconds hold - Kneeling Hip Flexor Stretch  - 1 x daily - 7 x weekly - 1 sets - 2-3 reps - 20-30 seconds hold - Standing 3-way hip Kicks near chair back  - 1 x daily - 7 x weekly - 1 sets - 10 reps - 5 seconds hold - Stand to Sit  - 1 x daily - 7 x weekly - 1 sets - 10 reps - 5 seconds hold  Access Code: [email or web address] URL: [email or web address] Date: 02/12/2023 Prepared by: Ivery Quale  Exercises - Standing March at [facility]  - 1 x daily - 2 x weekly - 15 reps - Standing Hip Flexion Extension at [employer]  - 1 x daily - 2 x weekly - 15 reps - Standing Hip Abduction Adduction at Pool Wall  - 1 x daily - 2 x weekly - 20 reps - Forward Walking  - 1 x daily - 2 x weekly -  4 sets - Backward Walking  - 1 x daily - 2 x weekly - 4 sets - Side Stepping  - 2 x daily - 2 x weekly - 4 sets - 10 reps - Backward March  - 1 x daily - 2 x weekly - 4 sets - Squat  - 1 x daily - 2 x weekly - 15 reps - Lunge to Target at [employer]  - 1 x daily - 2 x weekly - 1 sets - 20 reps - Alternating Arms Jumping in Place  - 1 x daily - 2 x weekly - 2 sets - 10 reps - Lateral Hops in  Water  - 1 x daily - 2 x weekly - 1 sets - 10 reps - Forward Monster Walk  - 1 x daily - 2 x weekly - 4 sets - Standing Single Leg Hip Circles  - 1 x daily - 2 x weekly - 1 sets - 20 reps  ASSESSMENT: CLINICAL IMPRESSION: Patient appears compliant with HEP which she understands land and aquatic  HEP and will be discharged from PT today as MD has recommended surgery for her.    OBJECTIVE IMPAIRMENTS: Abnormal gait, decreased activity tolerance, decreased balance, decreased knowledge of condition, difficulty walking, decreased ROM, decreased strength, impaired flexibility, and pain.   ACTIVITY LIMITATIONS:  carrying, lifting, bending, standing, squatting, stairs, and locomotion level  PARTICIPATION LIMITATIONS: community activity and occupation  PERSONAL FACTORS: Time since onset of injury/illness/exacerbation and 3+ comorbidities: see PMH  are also affecting patient's functional outcome.   REHAB POTENTIAL: Good  CLINICAL DECISION MAKING: Stable/uncomplicated  EVALUATION COMPLEXITY: Low   GOALS: Goals reviewed with patient? Yes  STGs = LTGs  LONG TERM GOALS: (target dates for all long term goals  03/06/2023 )   1. Patient will demonstrate/report pain at worst less than or equal to 2/10 to facilitate minimal limitation in daily activity secondary to pain symptoms. Baseline: patient reports pain up to 4/10 which is worse after she uses hip muscles or sits long times (like work or riding in car) then stands up extending her hip. Goal status: NOT MET 02/23/2023   2. Patient will demonstrate  independent use of home exercise program to facilitate ability to maintain/progress functional gains from skilled physical therapy services. Baseline: See objective data  Goal status:MET 02/27/23   3. Patient will demonstrate FOTO outcome > or = 67 % to indicate reduced disability due to condition. Baseline: NOT MET FOTO 61% at discharge Goal status: NOT MET 02/23/2023   4.  Patient will demonstrate right hip & knee MMT 5/5 throughout to faciltiate usual transfers, stairs, squatting at [facility] for daily life.  Baseline: See objective data & pain continues to limit functional strength Goal status: NOT MET 02/23/2023    PLAN:  PT FREQUENCY:  2x/week  PT DURATION: 4 weeks  PLANNED INTERVENTIONS: Therapeutic exercises, Therapeutic activity, Neuro Muscular re-education, Balance training, Gait training, Patient/Family education, Joint mobilization, Stair training, DME instructions, Dry Needling, Electrical stimulation, Traction, Cryotherapy, vasopneumatic deviceMoist heat, Taping, Ultrasound, Ionotophoresis 4mg /ml Dexamethasone, and aquatic therapy, Manual therapy.  All included unless contraindicated  PLAN FOR NEXT SESSION:  DC today and she will follow up with MD about surgery.    April Manson, PT, DPT 02/27/2023, 8:34 AM

## 2023-03-02 ENCOUNTER — Encounter: Payer: Self-pay | Admitting: Neurology

## 2023-03-02 ENCOUNTER — Encounter: Payer: Self-pay | Admitting: Family Medicine

## 2023-03-03 ENCOUNTER — Telehealth: Payer: Self-pay | Admitting: *Deleted

## 2023-03-03 NOTE — Telephone Encounter (Signed)
   Faxed to [facility] confirmation received

## 2023-03-09 ENCOUNTER — Other Ambulatory Visit (HOSPITAL_BASED_OUTPATIENT_CLINIC_OR_DEPARTMENT_OTHER): Payer: Self-pay

## 2023-03-09 ENCOUNTER — Ambulatory Visit (INDEPENDENT_AMBULATORY_CARE_PROVIDER_SITE_OTHER): Payer: Commercial Managed Care - PPO | Admitting: Family Medicine

## 2023-03-09 ENCOUNTER — Other Ambulatory Visit: Payer: Self-pay

## 2023-03-09 ENCOUNTER — Other Ambulatory Visit: Payer: Commercial Managed Care - PPO

## 2023-03-09 ENCOUNTER — Encounter: Payer: Self-pay | Admitting: Family Medicine

## 2023-03-09 VITALS — BP 112/73 | HR 84 | Temp 98.0°F | Ht 65.5 in | Wt 190.0 lb

## 2023-03-09 DIAGNOSIS — R1310 Dysphagia, unspecified: Secondary | ICD-10-CM

## 2023-03-09 DIAGNOSIS — Z01818 Encounter for other preprocedural examination: Secondary | ICD-10-CM | POA: Diagnosis not present

## 2023-03-09 DIAGNOSIS — E063 Autoimmune thyroiditis: Secondary | ICD-10-CM

## 2023-03-09 DIAGNOSIS — K449 Diaphragmatic hernia without obstruction or gangrene: Secondary | ICD-10-CM

## 2023-03-09 MED ORDER — OMEPRAZOLE 40 MG PO CPDR
40.0000 mg | DELAYED_RELEASE_CAPSULE | Freq: Every day | ORAL | 0 refills | Status: DC
Start: 2023-03-09 — End: 2023-04-20
  Filled 2023-03-09: qty 90, 90d supply, fill #0

## 2023-03-09 NOTE — Patient Instructions (Signed)
It was very nice to see you today!  Happy holidays!   PLEASE NOTE:  If you had any lab tests please let us know if you have not heard back within a few days. You may see your results on MyChart before we have a chance to review them but we will give you a call once they are reviewed by us. If we ordered any referrals today, please let us know if you have not heard from their office within the next week.   Please try these tips to maintain a healthy lifestyle:  Eat most of your calories during the day when you are active. Eliminate processed foods including packaged sweets (pies, cakes, cookies), reduce intake of potatoes, white bread, white pasta, and white rice. Look for whole grain options, oat flour or almond flour.  Each meal should contain half fruits/vegetables, one quarter protein, and one quarter carbs (no bigger than a computer mouse).  Cut down on sweet beverages. This includes juice, soda, and sweet tea. Also watch fruit intake, though this is a healthier sweet option, it still contains natural sugar! Limit to 3 servings daily.  Drink at least 1 glass of water with each meal and aim for at least 8 glasses per day  Exercise at least 150 minutes every week.   

## 2023-03-09 NOTE — Progress Notes (Signed)
Subjective:    Patient ID: Valerie [name], female    DOB: 1970/10/15, 52 y.o.   MRN: [id number]  Chief Complaint  Patient presents with   Procedure    Pt here for surgical clearance w/o any concerns    Gastroesophageal Reflux    HPI Surgical Clearance - Patient denies chest pain or SOB. Exercises in the pool and walks up and down the stairs in her home with no complications. Completed clearance for right hip arthroscopy with labral repair.   GERD - Endorses a fast heart beat, even when at rest. On omeprazole 20 mg daily, and every other day she takes 2 pills (40 mg).   Thyroid - On synthroid 88 mcg taking as directed.   Edema - States her LE edema has greatly improved. Stopped wearing compression socks due to getting in grown toenails from the wear.   There are no preventive care reminders to display for this patient.   Past Medical History:  Diagnosis Date   Abnormal echocardiogram 11/28/2021   EF 60 -65%, abnormal global longitudinal strain, see cardiology OV in Epic dated 12/25/21   Breast cancer (HCC)    Cancer (HCC) 09/2020   left breast, ER+, s/p radiation therapy 12/25/20 - 01/18/21, Patient follows with Dr. Serena Croissant @ [facility].   COVID-19 11/2019   flu-like symptoms, cough, no hospitalizations   Edema    lower extremity, follows w/ [facility], Sabino Dick, [place]. See OV dated 03/24/22 in Epic.   GERD (gastroesophageal reflux disease)    Follows w/ Dr. Judeen Hammans, gastroenterologist and PCP, Dr. [facility]ruth [facility] @ [facility].   Hashimoto's disease    Follows with Dr. Ernest Haber @ [place].   History of hiatal hernia    2 cm hiatal hernia per 01/08/22 EGD   History of radiation therapy 2022   completed 01-18-2021   [facility] (hard of hearing)    has gotten bilateral aides.   Hypothyroidism    Follows w/ endocronologist, Dr. Ernest Haber @ .   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 Despina Arias @ [facility].   Optic neuritis    Follows w/ opthamology, Dr. Mora Appl.03/2020 right eye (almost complete blindness), 04/2020 left eye   Personal history of radiation therapy    PONV (postoperative nausea and vomiting)    Status post dilation of esophageal narrowing 01/08/2022   EGD, esophagus dilated   Wears glasses     Past Surgical History:  Procedure Laterality Date   ABDOMINAL HYSTERECTOMY  04/23/22   BIOPSY BREAST Left 10/17/2020   BREAST LUMPECTOMY     BREAST LUMPECTOMY WITH RADIOACTIVE SEED AND SENTINEL LYMPH NODE BIOPSY Left 11/15/2020   Procedure: LEFT BREAST LUMPECTOMY WITH RADIOACTIVE SEED AND LEFT SENTINEL LYMPH NODE BIOPSY;  Surgeon: Harriette Bouillon, MD;  Location: [facility];  Service: General;  Laterality: Left;   BREAST SURGERY  August 2022   Cancer surgery   COLONOSCOPY  09/06/2021   cecal polyp   CT CORONARY CA SCORING  07/13/2020   Coronary Calcium = 0 (in Epic)   CYSTOSCOPY N/A 04/23/2022   Procedure: CYSTOSCOPY;  Surgeon: Jerene Bears, MD;  Location: [facility];  Service: Gynecology;  Laterality: N/A;   ESOPHAGOGASTRODUODENOSCOPY  01/08/2022   2cm hiatal hernia, esophagus dilated  TOTAL LAPAROSCOPIC HYSTERECTOMY WITH SALPINGECTOMY Bilateral 04/23/2022   Procedure: TOTAL LAPAROSCOPIC HYSTERECTOMY BILATERAL SALPINGECTOMY, LEFT OOPHORECTOMY;  Surgeon: Jerene Bears, MD;  Location: [facility];  Service: Gynecology;  Laterality: Bilateral;   TRANSTHORACIC ECHOCARDIOGRAM  11/28/2021   see results in Epic   TRANSTHORACIC ECHOCARDIOGRAM  07/05/2020   in Epic   TUBAL LIGATION  02/2012   WISDOM TOOTH EXTRACTION     early 1990's     Current Outpatient Medications:    ascorbic acid (VITAMIN C) 1000 MG tablet, Take 1 tablet by mouth daily., Disp: , Rfl:     cholecalciferol (VITAMIN D3) 25 MCG (1000 UNIT) tablet, Take 5,000 Units by mouth daily., Disp: , Rfl:    furosemide (LASIX) 20 MG tablet, TAKE 1 TABLET BY MOUTH DAILY, Disp: 90 tablet, Rfl: 3   levothyroxine (SYNTHROID) 88 MCG tablet, Take by mouth 88 mcg six days a week, Disp: 75 tablet, Rfl: 3   MAGNESIUM GLUCONATE PO, Take 2 tablets by mouth daily at 12 noon., Disp: , Rfl:    oxyCODONE (ROXICODONE) 5 MG immediate release tablet, Take 1 tablet (5 mg total) by mouth every 4 (four) hours as needed for severe pain (pain score 7-10) or breakthrough pain., Disp: 10 tablet, Rfl: 0   Probiotic Product (CULTRELLE KIDS IMMUNE DEFENSE PO), Take 1 tablet by mouth daily., Disp: , Rfl:    Tocilizumab (ACTEMRA) 162 MG/0.9ML SOSY, Inject 0.9 mLs (162 mg total) into the skin once a week. For MOGAD Takes weekly on Saturday., Disp: 10.8 mL, Rfl: 3   UNABLE TO FIND, Take 4 tablets by mouth daily. Med Name: Omega Pure 2 tablet in AM and 1 tablet in PM, Disp: , Rfl:    UNABLE TO FIND, Take by mouth daily. Med Name: Ultra Inflamix 2 scoops daily, Disp: , Rfl:    UNABLE TO FIND, Take 2 tablets by mouth daily. Med Name: R-Lipoic Acid, Disp: , Rfl:    aspirin EC 325 MG tablet, Take 1 tablet (325 mg total) by mouth daily. (Patient not taking: Reported on 03/09/2023), Disp: 30 tablet, Rfl: 0   omeprazole (PRILOSEC) 40 MG capsule, Take 1 capsule (40 mg total) by mouth daily., Disp: 90 capsule, Rfl: 0  No Known Allergies ROS neg/noncontributory except as noted HPI/below      Objective:     BP 112/73   Pulse 84   Temp 98 F (36.7 C)   Ht 5' 5.5" (1.664 m)   Wt 190 lb (86.2 kg)   LMP 12/23/2021 (Exact Date)   SpO2 96%   BMI 31.14 kg/m  Wt Readings from Last 3 Encounters:  03/09/23 190 lb (86.2 kg)  12/08/22 184 lb 6.4 oz (83.6 kg)  10/22/22 180 lb (81.6 kg)    Physical Exam   Gen: WDWN NAD HEENT: NCAT, conjunctiva not injected, sclera nonicteric NECK:  supple, no thyromegaly, no nodes, no carotid  bruits CARDIAC: RRR, S1S2+, no murmur. DP 2+B LUNGS: CTAB. No wheezes ABDOMEN:  BS+, soft, NTND, No HSM, no masses EXT:  no edema MSK: no gross abnormalities.  NEURO: A&O x3.  CN II-XII intact.  PSYCH: normal mood. Good eye contact    Assessment & Plan:  Hiatal hernia -     Omeprazole; Take 1 capsule (40 mg total) by mouth daily.  Dispense: 90 capsule; Refill: 0  Dysphagia, unspecified type -     Omeprazole; Take 1 capsule (40 mg total) by mouth daily.  Dispense: 90 capsule; Refill: 0   Pre-op clearance.  Low risk pt.  Cleared GERD-not ideal.  Taking omeprazole 40mg  every other day alt w/20mg .  Rx for 40mg  done and has f/u GI in Jan  No follow-ups on file.  Steward Ros Rice   I,Anaiya N Rice,acting as a scribe for Angelena Sole, MD.,have documented all relevant documentation on the behalf of Angelena Sole, MD,as directed by  Angelena Sole, MD while in the presence of Angelena Sole, MD.  Juleen Starr, have reviewed all documentation for this visit. The documentation on 03/09/23 for the exam, diagnosis, procedures, and orders are all accurate and complete.

## 2023-03-10 ENCOUNTER — Telehealth: Payer: Self-pay | Admitting: *Deleted

## 2023-03-10 DIAGNOSIS — G3781 Myelin oligodendrocyte glycoprotein antibody disease: Secondary | ICD-10-CM

## 2023-03-10 LAB — TSH: TSH: 2.44 m[IU]/L

## 2023-03-10 LAB — T4, FREE: Free T4: 1.1 ng/dL (ref 0.8–1.8)

## 2023-03-10 MED ORDER — ACTEMRA 162 MG/0.9ML ~~LOC~~ SOSY: 0.9000 mL | PREFILLED_SYRINGE | SUBCUTANEOUS | 1 refills | Status: DC

## 2023-03-10 NOTE — Telephone Encounter (Signed)
Faxed refill below to [facility] at [phone]. Received fax confirmation.

## 2023-03-11 ENCOUNTER — Encounter (HOSPITAL_BASED_OUTPATIENT_CLINIC_OR_DEPARTMENT_OTHER): Payer: Self-pay | Admitting: Orthopaedic Surgery

## 2023-03-16 NOTE — Telephone Encounter (Signed)
I spoke with the patient. She has been scheduled for surgery on 03/31/23. 03/11/23.

## 2023-03-25 ENCOUNTER — Encounter (HOSPITAL_BASED_OUTPATIENT_CLINIC_OR_DEPARTMENT_OTHER): Payer: Self-pay | Admitting: Orthopaedic Surgery

## 2023-03-27 ENCOUNTER — Encounter: Payer: Self-pay | Admitting: Rehabilitation

## 2023-03-27 ENCOUNTER — Encounter (HOSPITAL_BASED_OUTPATIENT_CLINIC_OR_DEPARTMENT_OTHER): Payer: Self-pay | Admitting: Orthopaedic Surgery

## 2023-03-31 ENCOUNTER — Encounter: Payer: Self-pay | Admitting: Orthopaedic Surgery

## 2023-03-31 DIAGNOSIS — S73191A Other sprain of right hip, initial encounter: Secondary | ICD-10-CM | POA: Diagnosis not present

## 2023-03-31 NOTE — Progress Notes (Signed)
Date of Surgery: 03/31/2023  INDICATIONS: Ms. Burn is a 52 y.o.-year-old female with right hip labral tear.  The risk and benefits of the procedure were discussed in detail and documented in the pre-operative evaluation.   PREOPERATIVE DIAGNOSIS: 1. Right hip labral tear  POSTOPERATIVE DIAGNOSIS: Same.  PROCEDURE: 1. Right hip labral repair  SURGEON: Benancio Deeds MD  ASSISTANT: Kerby Less, ATC  ANESTHESIA:  general  IV FLUIDS AND URINE: See anesthesia record.  ANTIBIOTICS: Ancef  ESTIMATED BLOOD LOSS: 5 mL.  IMPLANTS:  * No surgical log found *  DRAINS: None  CULTURES: None  COMPLICATIONS: none  DESCRIPTION OF PROCEDURE:   Cartilage Intact femoral and acetabular cartilage   Labrum Degenerative/torn appearing   Boundaries of labral tear Convention (3 o'clock anterior, 9 o'clock posterior) Anterior boundary: 3 o'clock Posterior boundary: 1 o'clock   OPERATIVE REPORT:  The patient was brought to the operating room, placed supine on the operating table, and bony prominences were padded.  The traction boots were applied with padding to ensure that safe traction could be applied through the feet.  The contralateral limb was abducted maximally and light traction was applied.  The operative leg was brought into neutral position.  The flouroscopic c-arm was brought between the legs for an AP image.  The patient was prepped and draped in a sterile fashion.  Time-out was performed and landmarks were identified. Traction was obtained and care was taken to ensure the least amount of force necessary to allow safe access to the joint of 8-89mm.  This was checked with fluoroscopy.    Next we placed an anterolateral portal under the assistance of fluoroscopy.  First, fluoroscopy was used to estimate the trajectory and starting point.  A 5mm incision with a #11 blade was made and a straight hemostat was used to dilate the portal through the appropriate tract.  We then placed  a 14-gauge hypodermic needle with careful technique to be as close to the femoral head as possible and parallel to the sorcele to ensure no iatrogenic damage to the labrum.  This released the negative pressure environment and the amount of traction was adjusted to maintain the 8-3mm of distraction.  A nitinol wire was placed through the needle and flouroscopy was used to ensure it extended to the medial wall of the acetabulum.  The Flowport from [employer] was placed over the wire and the nitinol wire was retracted to just inside the capsule during insertion of the dilator and cannula to minimize the risk of breakage. The arthroscope was placed next and we visualized the anterior triangle.     We then placed the anterior portal under direct visualization using the technique described above.  This was safely placed as well without damage to the labrum or femoral head.  We then switched our arthroscope to the anterior portal to ensure we were not through the labrum - we were safely through the capsule only.  We then proceeded with a transverse capsulotomy connecting the 2 portals in the same plane utilizing the Samurai blade from [employer].  We identified the anterior inferior iliac spine proximally, the psoas tendon medially and the rectus tendon laterally as landmarks.  We then proceeded with a diagnostic arthroscopy - the results can be found in the findings section above.    We then used the 50 degree hip specific radiofrequency device from [employer]. and a 4mm shaver to clear the superior acetabulum and expose the subspinous region.  Next  we exposed the acetabular rim leaving the chondral labral junction intact.  Working from both portals, the acetabular rim/subspinous region was reshaped with 5.5 mm bur consistent with the preoperative three-dimensional imaging.   When adequate reshaping was obtained we then proceeded with the labral repair. We placed 2 anchors at the 1:00 and 2:30  positions. The sutures were passed using the crescent Nanopass from [employer].  This resulted in anatomic labral repair.  We debrided the loose cartilage at the rim and residual degenerative labral tissue.  Traction was let down with total traction time of 26 minutes.     Finally, we performed a complete capsular closure with tape suture.  She was replaced in the anterior and posterior limb of the reported capsulotomy with excellent apposition. We then removed the arthroscope and closed the incisions with 3-0 nylon simple stitches.  A sterile dressing was applied..  The patient was awakened from anesthesia and transferred to PACU in stable condition. Postoperative care includes:       POSTOPERATIVE PLAN:    2 weeks touchdown weight bearing, hip labral repair protocol Formal physical therapy will begin this week. ASA 325 Daily for DVT prophylaxis      Benancio Deeds, MD 3:55 PM

## 2023-04-03 ENCOUNTER — Other Ambulatory Visit: Payer: Self-pay

## 2023-04-03 ENCOUNTER — Ambulatory Visit (HOSPITAL_BASED_OUTPATIENT_CLINIC_OR_DEPARTMENT_OTHER): Payer: Commercial Managed Care - PPO | Attending: Orthopaedic Surgery | Admitting: Physical Therapy

## 2023-04-03 ENCOUNTER — Encounter (HOSPITAL_BASED_OUTPATIENT_CLINIC_OR_DEPARTMENT_OTHER): Payer: Self-pay | Admitting: Physical Therapy

## 2023-04-03 DIAGNOSIS — R262 Difficulty in walking, not elsewhere classified: Secondary | ICD-10-CM | POA: Diagnosis present

## 2023-04-03 DIAGNOSIS — M25551 Pain in right hip: Secondary | ICD-10-CM | POA: Diagnosis present

## 2023-04-03 DIAGNOSIS — S73191A Other sprain of right hip, initial encounter: Secondary | ICD-10-CM | POA: Insufficient documentation

## 2023-04-03 NOTE — Therapy (Signed)
OUTPATIENT PHYSICAL THERAPY EVALUATION   Patient Name: Valerie [name] MRN: [id number] DOB:1970-07-02, 52 y.o., female Today's Date: 04/03/2023  END OF SESSION:  PT End of Session - 04/03/23 1445     Visit Number 1    Number of Visits 25    Date for PT Re-Evaluation 06/27/22    Authorization Type UHC  no VL    PT Start Time 0145    PT Stop Time 0225    PT Time Calculation (min) 40 min    Activity Tolerance Patient tolerated treatment well    Behavior During Therapy [facility] for tasks assessed/performed             Past Medical History:  Diagnosis Date   Abnormal echocardiogram 11/28/2021   EF 60 -65%, abnormal global longitudinal strain, see cardiology OV in Epic dated 12/25/21   Breast cancer (HCC)    Cancer (HCC) 09/2020   left breast, ER+, s/p radiation therapy 12/25/20 - 01/18/21, Patient follows with Dr. Serena Croissant @ [facility].   COVID-19 11/2019   flu-like symptoms, cough, no hospitalizations   Edema    lower extremity, follows w/ [facility], Sabino Dick, [place]. See OV dated 03/24/22 in Epic.   GERD (gastroesophageal reflux disease)    Follows w/ Dr. Judeen Hammans, gastroenterologist and PCP, Dr. Maryruth Hancock [facility] @ [facility].   Hashimoto's disease    Follows with Dr. Ernest Haber @ [place].   History of hiatal hernia    2 cm hiatal hernia per 01/08/22 EGD   History of radiation therapy 2022   completed 01-18-2021   [facility] (hard of hearing)    has gotten bilateral aides.   Hypothyroidism    Follows w/ endocronologist, Dr. Ernest Haber @ .   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 Despina Arias @ [facility].   Optic neuritis    Follows w/ opthamology, Dr. Mora Appl.03/2020 right eye (almost complete blindness), 04/2020 left eye   Personal history of radiation  therapy    PONV (postoperative nausea and vomiting)    Status post dilation of esophageal narrowing 01/08/2022   EGD, esophagus dilated   Wears glasses    Past Surgical History:  Procedure Laterality Date   ABDOMINAL HYSTERECTOMY  04/23/22   BIOPSY BREAST Left 10/17/2020   BREAST LUMPECTOMY     BREAST LUMPECTOMY WITH RADIOACTIVE SEED AND SENTINEL LYMPH NODE BIOPSY Left 11/15/2020   Procedure: LEFT BREAST LUMPECTOMY WITH RADIOACTIVE SEED AND LEFT SENTINEL LYMPH NODE BIOPSY;  Surgeon: Harriette Bouillon, MD;  Location: [facility];  Service: General;  Laterality: Left;   BREAST SURGERY  August 2022   Cancer surgery   COLONOSCOPY  09/06/2021   cecal polyp   CT CORONARY CA SCORING  07/13/2020   Coronary Calcium = 0 (in Epic)   CYSTOSCOPY N/A 04/23/2022   Procedure: CYSTOSCOPY;  Surgeon: Jerene Bears, MD;  Location: [facility];  Service: Gynecology;  Laterality: N/A;   ESOPHAGOGASTRODUODENOSCOPY  01/08/2022   2cm hiatal hernia, esophagus dilated   TOTAL LAPAROSCOPIC HYSTERECTOMY WITH SALPINGECTOMY Bilateral 04/23/2022   Procedure: TOTAL LAPAROSCOPIC HYSTERECTOMY BILATERAL SALPINGECTOMY, LEFT OOPHORECTOMY;  Surgeon: Jerene Bears, MD;  Location: [facility];  Service: Gynecology;  Laterality: Bilateral;   TRANSTHORACIC ECHOCARDIOGRAM  11/28/2021   see results in Epic  TRANSTHORACIC ECHOCARDIOGRAM  07/05/2020   in Epic   TUBAL LIGATION  02/2012   WISDOM TOOTH EXTRACTION     early 1990's   Patient Active Problem List   Diagnosis Date Noted   Genetic testing 07/04/2022   Family history of uterine cancer 06/24/2022   Local edema 02/25/2022   Hypothyroidism due to Hashimoto's thyroiditis 06/07/2021   Malignant neoplasm of lower-inner quadrant of left breast in female, estrogen receptor positive (HCC) 10/23/2020   Optic neuritis, left 10/18/2020   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) 07/10/2020   Mitral  regurgitation    Neuromyelitis optica (devic) (HCC) 05/07/2020   Hypothyroid 04/25/2020   Optic neuritis, right 04/24/2020     REFERRING PROVIDER:  Huel Cote, MD    REFERRING DIAG:  [phone] (ICD-10-CM) - Tear of right acetabular labrum, initial encounter   s/p Rt acetabular labral repair  Rationale for Evaluation and Treatment: Rehabilitation  THERAPY DIAG:  Pain in right hip  Difficulty in walking, not elsewhere classified  ONSET DATE: DOS 03/31/23   SUBJECTIVE:                                                                                                                                                                                           SUBJECTIVE STATEMENT: Feeling pretty good. Hip pain for 15 years- best I can think was after a hike with my daughter. Was actually seeing MD for knee when we looked at the hip. 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   PERTINENT HISTORY:  N/a  PAIN:  Are you having pain? No  PRECAUTIONS:  None  RED FLAGS: None   WEIGHT BEARING RESTRICTIONS:  Yes 2 wk TDWB  FALLS:  Has patient fallen in last 6 months? No  LIVING ENVIRONMENT: Lives with: lives with their spouse and 2 cats Lives in: House [place] with basement (washer/dryer & garage where she parks) Stairs: Yes: Internal: 12-14 steps; on left going up and External: 4 steps; on right going up Has following equipment at home: None  OCCUPATION:  works for [employer] including lifting up 25-30#, pushing & pulling   PLOF:  Independent  PATIENT GOALS:  Back to gym, improve flexibility   OBJECTIVE:  Note: Objective measures were completed at Evaluation unless otherwise noted.  PATIENT SURVEYS:  FOTO 25   GAIT: Eval: arrived with bil axillary crutches with brace, using crutches more for stability  TREATMENT DATE:    Treatment                            12/20 eval:  Supine glut set Prone position Prone glut sets Gait training with crutches Recumb bike fit & ride- able to make full revolutions Changed bandages- no s/s of infection    PATIENT EDUCATION:  Education details: Anatomy of condition, POC, HEP, exercise form/rationale Person educated: Patient Education method: Explanation, Demonstration, Tactile cues, Verbal cues, and Handouts Education comprehension: verbalized understanding, returned demonstration, verbal cues required, tactile cues required, and needs further education  HOME EXERCISE PROGRAM:  [email or web address]  Access Code: [email or web address]   ASSESSMENT:  CLINICAL IMPRESSION: Patient is a 52 y.o. F who was seen today for physical therapy evaluation and treatment for s/p rt hip labral repair.      REHAB POTENTIAL: Good  CLINICAL DECISION MAKING: Stable/uncomplicated  EVALUATION COMPLEXITY: Low   GOALS: Goals reviewed with patient? Yes  SHORT TERM GOALS: Target date: 4 weeks 04/28/23  Pain free flexion to 90 Baseline: Goal status: INITIAL   2.  Demo controlled quad set with hold Baseline:  Goal status: INITIAL   3.  30s sit to stand without pain or compensation Baseline:  Goal status: INITIAL   4.  SLS 30s without compensation Baseline:  Goal status: INITIAL  LONG TERM GOALS:  Able to demo step up on 6" step with level pelvis and proper form Baseline:  Goal status: INITIAL Week 8 05/26/23  2.  Will tolerate at least 3 min on elliptical, demonstrating good tolerance to repetitive weight bearing motion Baseline:  Goal status: INITIAL  Week 8 05/26/23  3.  Demonstrate proper form in at least 10 continuous lunges without increased pain Baseline:  Goal status: INITIAL  Week 12 06/23/23  4.  Demonstrate gentle, double and single foot plyometric motions with good proximal form Baseline:  Goal status: INITIAL Week 12 06/23/23      PLAN:  PT  FREQUENCY: 1-2x/week  PT DURATION: 12 weeks  PLANNED INTERVENTIONS: 97164- PT Re-evaluation, 97110-Therapeutic exercises, 97530- Therapeutic activity, 97112- Neuromuscular re-education, 97535- Self Care, 25427- Manual therapy, [phone]- Gait training, [phone]- Aquatic Therapy, Patient/Family education, Balance training, Stair training, Taping, Dry Needling, Joint mobilization, Spinal mobilization, Scar mobilization, Cryotherapy, and Moist heat.  PLAN FOR NEXT SESSION: per protocol   Rosia Syme C. Teale Goodgame PT, DPT 04/03/23 2:50 PM

## 2023-04-09 ENCOUNTER — Encounter (HOSPITAL_BASED_OUTPATIENT_CLINIC_OR_DEPARTMENT_OTHER): Payer: Self-pay | Admitting: Physical Therapy

## 2023-04-09 ENCOUNTER — Ambulatory Visit (HOSPITAL_BASED_OUTPATIENT_CLINIC_OR_DEPARTMENT_OTHER): Payer: Commercial Managed Care - PPO | Admitting: Physical Therapy

## 2023-04-09 DIAGNOSIS — M25551 Pain in right hip: Secondary | ICD-10-CM | POA: Diagnosis not present

## 2023-04-09 DIAGNOSIS — R262 Difficulty in walking, not elsewhere classified: Secondary | ICD-10-CM

## 2023-04-09 NOTE — Therapy (Signed)
OUTPATIENT PHYSICAL THERAPY TREATMENT   Patient Name: Valerie [name] MRN: [id number] DOB:Jan 23, 1971, 52 y.o., female Today's Date: 04/09/2023  END OF SESSION:  PT End of Session - 04/09/23 1139     Visit Number 2    Number of Visits 25    Date for PT Re-Evaluation 06/27/22    Authorization Type UHC  no VL    PT Start Time 1100    PT Stop Time 1130    PT Time Calculation (min) 30 min    Activity Tolerance Patient tolerated treatment well    Behavior During Therapy WFL for tasks assessed/performed              Past Medical History:  Diagnosis Date   Abnormal echocardiogram 11/28/2021   EF 60 -65%, abnormal global longitudinal strain, see cardiology OV in Epic dated 12/25/21   Breast cancer (HCC)    Cancer (HCC) 09/2020   left breast, ER+, s/p radiation therapy 12/25/20 - 01/18/21, Patient follows with Dr. Serena Croissant @ [facility].   COVID-19 11/2019   flu-like symptoms, cough, no hospitalizations   Edema    lower extremity, follows w/ [facility], Sabino Dick, [place]. See OV dated 03/24/22 in Epic.   GERD (gastroesophageal reflux disease)    Follows w/ Dr. Judeen Hammans, gastroenterologist and PCP, Dr. Maryruth Hancock [facility] @ [facility].   Hashimoto's disease    Follows with Dr. Ernest Haber @ [place].   History of hiatal hernia    2 cm hiatal hernia per 01/08/22 EGD   History of radiation therapy 2022   completed 01-18-2021   [facility] (hard of hearing)    has gotten bilateral aides.   Hypothyroidism    Follows w/ endocronologist, Dr. Ernest Haber @ .   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 Despina Arias @ [facility].   Optic neuritis    Follows w/ opthamology, Dr. Mora Appl.03/2020 right eye (almost complete blindness), 04/2020 left eye   Personal history of radiation  therapy    PONV (postoperative nausea and vomiting)    Status post dilation of esophageal narrowing 01/08/2022   EGD, esophagus dilated   Wears glasses    Past Surgical History:  Procedure Laterality Date   ABDOMINAL HYSTERECTOMY  04/23/22   BIOPSY BREAST Left 10/17/2020   BREAST LUMPECTOMY     BREAST LUMPECTOMY WITH RADIOACTIVE SEED AND SENTINEL LYMPH NODE BIOPSY Left 11/15/2020   Procedure: LEFT BREAST LUMPECTOMY WITH RADIOACTIVE SEED AND LEFT SENTINEL LYMPH NODE BIOPSY;  Surgeon: Harriette Bouillon, MD;  Location: [facility];  Service: General;  Laterality: Left;   BREAST SURGERY  August 2022   Cancer surgery   COLONOSCOPY  09/06/2021   cecal polyp   CT CORONARY CA SCORING  07/13/2020   Coronary Calcium = 0 (in Epic)   CYSTOSCOPY N/A 04/23/2022   Procedure: CYSTOSCOPY;  Surgeon: Jerene Bears, MD;  Location: [facility];  Service: Gynecology;  Laterality: N/A;   ESOPHAGOGASTRODUODENOSCOPY  01/08/2022   2cm hiatal hernia, esophagus dilated   TOTAL LAPAROSCOPIC HYSTERECTOMY WITH SALPINGECTOMY Bilateral 04/23/2022   Procedure: TOTAL LAPAROSCOPIC HYSTERECTOMY BILATERAL SALPINGECTOMY, LEFT OOPHORECTOMY;  Surgeon: Jerene Bears, MD;  Location: [facility];  Service: Gynecology;  Laterality: Bilateral;   TRANSTHORACIC ECHOCARDIOGRAM  11/28/2021   see results in  Epic   TRANSTHORACIC ECHOCARDIOGRAM  07/05/2020   in Epic   TUBAL LIGATION  02/2012   WISDOM TOOTH EXTRACTION     early 1990's   Patient Active Problem List   Diagnosis Date Noted   Genetic testing 07/04/2022   Family history of uterine cancer 06/24/2022   Local edema 02/25/2022   Hypothyroidism due to Hashimoto's thyroiditis 06/07/2021   Malignant neoplasm of lower-inner quadrant of left breast in female, estrogen receptor positive (HCC) 10/23/2020   Optic neuritis, left 10/18/2020   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) 07/10/2020   Mitral  regurgitation    Neuromyelitis optica (devic) (HCC) 05/07/2020   Hypothyroid 04/25/2020   Optic neuritis, right 04/24/2020     REFERRING PROVIDER:  Huel Cote, MD    REFERRING DIAG:  [phone] (ICD-10-CM) - Tear of right acetabular labrum, initial encounter   s/p Rt acetabular labral repair  Rationale for Evaluation and Treatment: Rehabilitation  THERAPY DIAG:  Pain in right hip  Difficulty in walking, not elsewhere classified  ONSET DATE: DOS 03/31/23   SUBJECTIVE:                                                                                                                                                                                           SUBJECTIVE STATEMENT:   Pt states the incision site is  mildly painful. Has been changing bandages but the one keeps getting wet with showers. She has been walking with the crutches but has been putting weight through that leg. Pt arrives without brace on. Has missed the last 2 day of aspirin but does use LE compression devices.   Eval:  Feeling pretty good. Hip pain for 15 years- best I can think was after a hike with my daughter. Was actually seeing MD for knee when we looked at the hip. 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   PERTINENT HISTORY:  N/a  PAIN:  Are you having pain? Yes 1/10 into the R hip   PRECAUTIONS:  None  RED FLAGS: None   WEIGHT BEARING RESTRICTIONS:  Yes 2 wk TDWB  FALLS:  Has patient fallen in last 6 months? No  LIVING ENVIRONMENT: Lives with: lives with their spouse and 2 cats Lives in: House [place] with basement (washer/dryer & garage where she parks) Stairs: Yes: Internal: 12-14 steps; on left going up and External: 4 steps; on right going up Has following equipment at home: None  OCCUPATION:  works for [employer] including lifting up 25-30#, pushing & pulling   PLOF:  Independent  PATIENT GOALS:  Back to gym, improve flexibility   OBJECTIVE:  Note:  Objective measures were completed at Evaluation unless otherwise noted.  PATIENT SURVEYS:  FOTO 25   GAIT: Eval: arrived with bil axillary crutches with brace, using crutches more for stability                                                                                                                             TREATMENT DATE:   12/26  Bandage change and wound inspection- clean dry, mild erythema around both portals, small pimple -ike spot noted near superior portal site  PROM to protocol limits LAD with circumduction HEP verbal review Crutch sizing and gait training on steps with WB precautions    Treatment                            12/20 eval:  Supine glut set Prone position Prone glut sets Gait training with crutches Recumb bike fit & ride- able to make full revolutions Changed bandages- no s/s of infection    PATIENT EDUCATION:  Education details: Anatomy of condition, POC, HEP, exercise form/rationale Person educated: Patient Education method: Explanation, Demonstration, Tactile cues, Verbal cues, and Handouts Education comprehension: verbalized understanding, returned demonstration, verbal cues required, tactile cues required, and needs further education  HOME EXERCISE PROGRAM:  [email or web address]  Access Code: [email or web address]   ASSESSMENT:  CLINICAL IMPRESSION: Pt able to reach protocol limits today without pain. Pt advised to return to TTWB with AD and usage of brace and aspirin. Surgical ROM precautions reviewed. AD sized appropriately and pt able to return demo step through pattern and safe stair management. Pain continues to be well managed. Plan to continue with PROM and light muscle activation per protocol. Pt would benefit from continued skilled therapy in order to reach goals and maximize functional R LE strength and ROM for full return to PLOF.      REHAB POTENTIAL: Good  CLINICAL DECISION MAKING: Stable/uncomplicated  EVALUATION  COMPLEXITY: Low   GOALS: Goals reviewed with patient? Yes  SHORT TERM GOALS: Target date: 4 weeks 04/28/23  Pain free flexion to 90 Baseline: Goal status: INITIAL   2.  Demo controlled quad set with hold Baseline:  Goal status: INITIAL   3.  30s sit to stand without pain or compensation Baseline:  Goal status: INITIAL   4.  SLS 30s without compensation Baseline:  Goal status: INITIAL  LONG TERM GOALS:  Able to demo step up on 6" step with level pelvis and proper form Baseline:  Goal status: INITIAL Week 8 05/26/23  2.  Will tolerate at least 3 min on elliptical, demonstrating good tolerance to repetitive weight bearing motion Baseline:  Goal status: INITIAL  Week 8 05/26/23  3.  Demonstrate proper form in at least 10 continuous lunges without increased pain Baseline:  Goal status: INITIAL  Week 12 06/23/23  4.  Demonstrate gentle, double and single foot plyometric motions with good proximal form Baseline:  Goal status:  INITIAL Week 12 06/23/23      PLAN:  PT FREQUENCY: 1-2x/week  PT DURATION: 12 weeks  PLANNED INTERVENTIONS: 97164- PT Re-evaluation, 97110-Therapeutic exercises, 97530- Therapeutic activity, 97112- Neuromuscular re-education, 97535- Self Care, 95284- Manual therapy, [phone]- Gait training, [phone]- Aquatic Therapy, Patient/Family education, Balance training, Stair training, Taping, Dry Needling, Joint mobilization, Spinal mobilization, Scar mobilization, Cryotherapy, and Moist heat.  PLAN FOR NEXT SESSION: per protocol   Zebedee Iba PT, DPT 04/09/23 11:45 AM

## 2023-04-10 ENCOUNTER — Encounter (HOSPITAL_BASED_OUTPATIENT_CLINIC_OR_DEPARTMENT_OTHER): Payer: Self-pay | Admitting: Physical Therapy

## 2023-04-16 ENCOUNTER — Ambulatory Visit (HOSPITAL_BASED_OUTPATIENT_CLINIC_OR_DEPARTMENT_OTHER): Payer: Commercial Managed Care - PPO | Attending: Orthopaedic Surgery | Admitting: Physical Therapy

## 2023-04-16 ENCOUNTER — Encounter (HOSPITAL_BASED_OUTPATIENT_CLINIC_OR_DEPARTMENT_OTHER): Payer: Self-pay | Admitting: Physical Therapy

## 2023-04-16 DIAGNOSIS — M25651 Stiffness of right hip, not elsewhere classified: Secondary | ICD-10-CM | POA: Insufficient documentation

## 2023-04-16 DIAGNOSIS — R262 Difficulty in walking, not elsewhere classified: Secondary | ICD-10-CM | POA: Diagnosis present

## 2023-04-16 DIAGNOSIS — M6281 Muscle weakness (generalized): Secondary | ICD-10-CM | POA: Diagnosis present

## 2023-04-16 DIAGNOSIS — M25551 Pain in right hip: Secondary | ICD-10-CM | POA: Diagnosis present

## 2023-04-16 NOTE — Therapy (Signed)
 OUTPATIENT PHYSICAL THERAPY TREATMENT   Patient Name: Valerie [name] MRN: [id number] DOB:1971-02-12, 53 y.o., female Today's Date: 04/16/2023  END OF SESSION:  PT End of Session - 04/16/23 1109     Visit Number 3    Number of Visits 25    Date for PT Re-Evaluation 06/27/22    Authorization Type UHC  no VL    PT Start Time 1100    PT Stop Time 1133    PT Time Calculation (min) 33 min    Activity Tolerance Patient tolerated treatment well    Behavior During Therapy WFL for tasks assessed/performed               Past Medical History:  Diagnosis Date   Abnormal echocardiogram 11/28/2021   EF 60 -65%, abnormal global longitudinal strain, see cardiology OV in Epic dated 12/25/21   Breast cancer (HCC)    Cancer (HCC) 09/2020   left breast, ER+, s/p radiation therapy 12/25/20 - 01/18/21, Patient follows with Dr. Vinay Gudena @ [facility].   COVID-19 11/2019   flu-like symptoms, cough, no hospitalizations   Edema    lower extremity, follows w/ [facility], [place], [place]. See OV dated 03/24/22 in Epic.   GERD (gastroesophageal reflux disease)    Follows w/ Dr. Rosario Guppy, gastroenterologist and PCP, Dr. Jenkins Jansky [facility] @ [facility].   Hashimoto's disease    Follows with Dr. Tawni Fendt @ [place].   History of hiatal hernia    2 cm hiatal hernia per 01/08/22 EGD   History of radiation therapy 2022   completed 01-18-2021   [facility] (hard of hearing)    has gotten bilateral aides.   Hypothyroidism    Follows w/ endocronologist, Dr. Tawni Fendt @ [place].   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 Charlie Crete @ [facility].   Optic neuritis    Follows w/ opthamology, Dr. 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), 04/2020 left eye   Personal history of radiation  therapy    PONV (postoperative nausea and vomiting)    Status post dilation of esophageal narrowing 01/08/2022   EGD, esophagus dilated   Wears glasses    Past Surgical History:  Procedure Laterality Date   ABDOMINAL HYSTERECTOMY  04/23/22   BIOPSY BREAST Left 10/17/2020   BREAST LUMPECTOMY     BREAST LUMPECTOMY WITH RADIOACTIVE SEED AND SENTINEL LYMPH NODE BIOPSY Left 11/15/2020   Procedure: LEFT BREAST LUMPECTOMY WITH RADIOACTIVE SEED AND LEFT SENTINEL LYMPH NODE BIOPSY;  Surgeon: Vanderbilt Ned, MD;  Location: [facility];  Service: General;  Laterality: Left;   BREAST SURGERY  August 2022   Cancer surgery   COLONOSCOPY  09/06/2021   cecal polyp   CT CORONARY CA SCORING  07/13/2020   Coronary Calcium = 0 (in Epic)   CYSTOSCOPY N/A 04/23/2022   Procedure: CYSTOSCOPY;  Surgeon: Pinal Ronal RAMAN, MD;  Location[facility];  Service: Gynecology;  Laterality: N/A;   ESOPHAGOGASTRODUODENOSCOPY  01/08/2022   2cm hiatal hernia, esophagus dilated   TOTAL LAPAROSCOPIC HYSTERECTOMY WITH SALPINGECTOMY Bilateral 04/23/2022   Procedure: TOTAL LAPAROSCOPIC HYSTERECTOMY BILATERAL SALPINGECTOMY, LEFT OOPHORECTOMY;  Surgeon: Pinal Ronal RAMAN, MD;  Location: [facility] ;  Service: Gynecology;  Laterality: Bilateral;   TRANSTHORACIC ECHOCARDIOGRAM  11/28/2021   see results  in Epic   TRANSTHORACIC ECHOCARDIOGRAM  07/05/2020   in Epic   TUBAL LIGATION  02/2012   WISDOM TOOTH EXTRACTION     early 1990's   Patient Active Problem List   Diagnosis Date Noted   Genetic testing 07/04/2022   Family history of uterine cancer 06/24/2022   Local edema 02/25/2022   Hypothyroidism due to Hashimoto's thyroiditis 06/07/2021   Malignant neoplasm of lower-inner quadrant of left breast in female, estrogen receptor positive (HCC) 10/23/2020   Optic neuritis, left 10/18/2020   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) 07/10/2020   Mitral  regurgitation    Neuromyelitis optica (devic) (HCC) 05/07/2020   Hypothyroid 04/25/2020   Optic neuritis, right 04/24/2020     REFERRING PROVIDER:  Genelle Standing, MD    REFERRING DIAG:  [phone] (ICD-10-CM) - Tear of right acetabular labrum, initial encounter   s/p Rt acetabular labral repair  Rationale for Evaluation and Treatment: Rehabilitation  THERAPY DIAG:  Pain in right hip  Difficulty in walking, not elsewhere classified  ONSET DATE: DOS 03/31/23   SUBJECTIVE:                                                                                                                                                                                           SUBJECTIVE STATEMENT:   Pt states that there is more NT into the leg that is off and on. Foot elevated makes it go away. Pt has been using the crutches. Pt notes that the incision site is weeping and is yellow in color. Denies systemic signs of infection. Pt sees MD tomorrow for f/u. Has been changing bandages near daily.   Eval:  Feeling pretty good. Hip pain for 15 years- best I can think was after a hike with my daughter. Was actually seeing MD for knee when we looked at the hip. 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   PERTINENT HISTORY:  N/a  PAIN:  Are you having pain? Yes 1/10 into the R hip   PRECAUTIONS:  None  RED FLAGS: None   WEIGHT BEARING RESTRICTIONS:  Yes 2 wk TDWB  FALLS:  Has patient fallen in last 6 months? No  LIVING ENVIRONMENT: Lives with: lives with their spouse and 2 cats Lives in: House ranch with basement (washer/dryer & garage where she parks) Stairs: Yes: Internal: 12-14 steps; on left going up and External: 4 steps; on right going up Has following equipment at home: None  OCCUPATION:  works for [employer] including lifting up 25-30#, pushing & pulling   PLOF:  Independent  PATIENT GOALS:  Back to gym, improve flexibility  OBJECTIVE:  Note: Objective  measures were completed at Evaluation unless otherwise noted.  PATIENT SURVEYS:  FOTO 25   GAIT: Eval: arrived with bil axillary crutches with brace, using crutches more for stability                                                                                                                             TREATMENT DATE:   1/2  PROM to protocol limits LAD with circumduction  Bridge not past 0 2x10 Clam to parallel 2x10 PPT 20x Recumbent bike to 90 of hip flexion 5 min lvl 1  Wound inspection, review precautions   12/26  Bandage change and wound inspection- clean dry, mild erythema around both portals, small pimple -ike spot noted near superior portal site  PROM to protocol limits LAD with circumduction HEP verbal review Crutch sizing and gait training on steps with WB precautions    Treatment                            12/20 eval:  Supine glut set Prone position Prone glut sets Gait training with crutches Recumb bike fit & ride- able to make full revolutions Changed bandages- no s/s of infection    PATIENT EDUCATION:  Education details: Anatomy of condition, POC, HEP, exercise form/rationale Person educated: Patient Education method: Explanation, Demonstration, Tactile cues, Verbal cues, and Handouts Education comprehension: verbalized understanding, returned demonstration, verbal cues required, tactile cues required, and needs further education  HOME EXERCISE PROGRAM:  [email or web address]  Access Code: [email or web address]    ASSESSMENT:  CLINICAL IMPRESSION: Pt 2 wks at this time. Wound inspection without signs of infection. Pt able to continue with muscle activation as per protocol but she does find glute contraction to be difficult and fatiguing. HEP updated accordingly. Continue per protocol as pt will likely progress WB after MD f/u. Pt would benefit from continued skilled therapy in order to reach goals and maximize functional R LE strength and ROM for  full return to PLOF.      REHAB POTENTIAL: Good  CLINICAL DECISION MAKING: Stable/uncomplicated  EVALUATION COMPLEXITY: Low   GOALS: Goals reviewed with patient? Yes  SHORT TERM GOALS: Target date: 4 weeks 04/28/23  Pain free flexion to 90 Baseline: Goal status: INITIAL   2.  Demo controlled quad set with hold Baseline:  Goal status: INITIAL   3.  30s sit to stand without pain or compensation Baseline:  Goal status: INITIAL   4.  SLS 30s without compensation Baseline:  Goal status: INITIAL  LONG TERM GOALS:  Able to demo step up on 6 step with level pelvis and proper form Baseline:  Goal status: INITIAL Week 8 05/26/23  2.  Will tolerate at least 3 min on elliptical, demonstrating good tolerance to repetitive weight bearing motion Baseline:  Goal status: INITIAL  Week 8 05/26/23  3.  Demonstrate proper form in at least 10 continuous lunges without  increased pain Baseline:  Goal status: INITIAL  Week 12 06/23/23  4.  Demonstrate gentle, double and single foot plyometric motions with good proximal form Baseline:  Goal status: INITIAL Week 12 06/23/23      PLAN:  PT FREQUENCY: 1-2x/week  PT DURATION: 12 weeks  PLANNED INTERVENTIONS: 97164- PT Re-evaluation, 97110-Therapeutic exercises, 97530- Therapeutic activity, 97112- Neuromuscular re-education, 97535- Self Care, [id number]- Manual therapy, [phone]- Gait training, [phone]- Aquatic Therapy, Patient/Family education, Balance training, Stair training, Taping, Dry Needling, Joint mobilization, Spinal mobilization, Scar mobilization, Cryotherapy, and Moist heat.  PLAN FOR NEXT SESSION: per protocol   Dale Call PT, DPT 04/16/23 11:37 AM

## 2023-04-17 ENCOUNTER — Ambulatory Visit (HOSPITAL_BASED_OUTPATIENT_CLINIC_OR_DEPARTMENT_OTHER): Payer: Commercial Managed Care - PPO | Admitting: Orthopaedic Surgery

## 2023-04-17 DIAGNOSIS — S73191A Other sprain of right hip, initial encounter: Secondary | ICD-10-CM

## 2023-04-17 NOTE — Progress Notes (Signed)
 Post Operative Evaluation    Procedure/Date of Surgery: Right hip labral repair 12/17  Interval History:     Presents today 2 weeks status post above procedure.  Overall she is doing very well.  She has been progressing her weightbearing and range of motion which is going quite nicely.  Denies any significant leg pain.   PMH/PSH/Family History/Social History/Meds/Allergies:    Past Medical History:  Diagnosis Date   Abnormal echocardiogram 11/28/2021   EF 60 -65%, abnormal global longitudinal strain, see cardiology OV in Epic dated 12/25/21   Breast cancer (HCC)    Cancer (HCC) 09/2020   left breast, ER+, s/p radiation therapy 12/25/20 - 01/18/21, Patient follows with Dr. Mackey Chad @ [facility].   COVID-19 11/2019   flu-like symptoms, cough, no hospitalizations   Edema    lower extremity, follows w/ [facility], [place], [place]. See OV dated 03/24/22 in Epic.   GERD (gastroesophageal reflux disease)    Follows w/ Dr. Rosario Guppy, gastroenterologist and PCP, Dr. Jenkins Jansky [facility] @ [facility].   Hashimoto's disease    Follows with Dr. Tawni Fendt @ [place].   History of hiatal hernia    2 cm hiatal hernia per 01/08/22 EGD   History of radiation therapy 2022   completed 01-18-2021   [facility] (hard of hearing)    has gotten bilateral aides.   Hypothyroidism    Follows w/ endocronologist, Dr. Tawni Fendt @ .   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 Charlie Crete @ [facility].   Optic neuritis    Follows w/ opthamology, Dr. 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), 04/2020 left eye   Personal history of radiation therapy    PONV (postoperative nausea and vomiting)    Status post dilation of esophageal narrowing 01/08/2022   EGD,  esophagus dilated   Wears glasses    Past Surgical History:  Procedure Laterality Date   ABDOMINAL HYSTERECTOMY  04/23/22   BIOPSY BREAST Left 10/17/2020   BREAST LUMPECTOMY     BREAST LUMPECTOMY WITH RADIOACTIVE SEED AND SENTINEL LYMPH NODE BIOPSY Left 11/15/2020   Procedure: LEFT BREAST LUMPECTOMY WITH RADIOACTIVE SEED AND LEFT SENTINEL LYMPH NODE BIOPSY;  Surgeon: Vanderbilt Ned, MD;  Location: [facility];  Service: General;  Laterality: Left;   BREAST SURGERY  August 2022   Cancer surgery   COLONOSCOPY  09/06/2021   cecal polyp   CT CORONARY CA SCORING  07/13/2020   Coronary Calcium = 0 (in Epic)   CYSTOSCOPY N/A 04/23/2022   Procedure: CYSTOSCOPY;  Surgeon: Pinal Ronal RAMAN, MD;  Location: [facility];  Service: Gynecology;  Laterality: N/A;   ESOPHAGOGASTRODUODENOSCOPY  01/08/2022   2cm hiatal hernia, esophagus dilated   TOTAL LAPAROSCOPIC HYSTERECTOMY WITH SALPINGECTOMY Bilateral 04/23/2022   Procedure: TOTAL LAPAROSCOPIC HYSTERECTOMY BILATERAL SALPINGECTOMY, LEFT OOPHORECTOMY;  Surgeon: Pinal Ronal RAMAN, MD;  Location: [facility];  Service: Gynecology;  Laterality: Bilateral;   TRANSTHORACIC ECHOCARDIOGRAM  11/28/2021   see results in Epic   TRANSTHORACIC ECHOCARDIOGRAM  07/05/2020   in Epic   TUBAL LIGATION  02/2012   WISDOM TOOTH  EXTRACTION     early 1990's   Social History   Socioeconomic History   Marital status: Married    Spouse name: Not on file   Number of children: 2   Years of education: BS   Highest education level: Bachelor's degree (e.g., BA, AB, BS)  Occupational History   Occupation: Psychologist, Occupational   Occupation: Shipping and receiving clerk  Tobacco Use   Smoking status: Never   Smokeless tobacco: Not on file  Vaping Use   Vaping status: Never Used  Substance and Sexual Activity   Alcohol use: Never   Drug use: Never   Sexual activity: Yes    Birth control/protection: Surgical, None    Comment:  hysterectomy  Other Topics Concern   Not on file  Social History Narrative   Right handed    Caffeine use: rare   Social Drivers of Health   Financial Resource Strain: Low Risk  (03/05/2023)   Overall Financial Resource Strain (CARDIA)    Difficulty of Paying Living Expenses: Not hard at all  Food Insecurity: No Food Insecurity (03/05/2023)   Hunger Vital Sign    Worried About Running Out of Food in the Last Year: Never true    Ran Out of Food in the Last Year: Never true  Transportation Needs: No Transportation Needs (03/05/2023)   PRAPARE - Administrator, Civil Service (Medical): No    Lack of Transportation (Non-Medical): No  Physical Activity: Insufficiently Active (03/05/2023)   Exercise Vital Sign    Days of Exercise per Week: 2 days    Minutes of Exercise per Session: 30 min  Stress: No Stress Concern Present (03/05/2023)   [employer] of Occupational Health - Occupational Stress Questionnaire    Feeling of Stress : Only a little  Social Connections: Moderately Integrated (03/05/2023)   Social Connection and Isolation Panel [NHANES]    Frequency of Communication with Friends and Family: Once a week    Frequency of Social Gatherings with Friends and Family: Once a week    Attends Religious Services: More than 4 times per year    Active Member of [employer] or Organizations: Yes    Attends Engineer, Structural: More than 4 times per year    Marital Status: Married   Family History  Problem Relation Age of Onset   Hearing loss Mother    Thyroid  disease Mother    Cancer Mother 53       endometrial cancer, lynch negative   GER disease Mother    Other Mother        uterine fibroids, optic disc abnormality    Eczema Mother    Colon polyps Mother    Hypothyroidism Mother    Osteoarthritis Mother    Hypertension Mother    Migraines Mother    Colon polyps Father    Hearing loss Father    Other Father        Colon adenoma   Hyperlipidemia  Father    GER disease Father    Heart murmur Father    Amblyopia Father        Right eye   Neuropathy Father    Glaucoma Maternal Grandmother    Macular degeneration Maternal Grandmother    Arthritis Maternal Grandmother    Multiple sclerosis Paternal Grandmother    Arthritis Maternal Grandfather    Heart disease Paternal Grandfather    Diabetes Maternal Aunt    Breast cancer Neg Hx    Colon cancer Neg Hx  Esophageal cancer Neg Hx    Rectal cancer Neg Hx    Stomach cancer Neg Hx    No Known Allergies Current Outpatient Medications  Medication Sig Dispense Refill   ACTEMRA  162 MG/0.9ML SOSY Inject 0.9 mLs (162 mg total) into the skin once a week. For MOGAD Takes weekly on Saturday. 10.8 mL 1   ascorbic acid  (VITAMIN C) 1000 MG tablet Take 1 tablet by mouth daily.     aspirin  EC 325 MG tablet Take 1 tablet (325 mg total) by mouth daily. (Patient not taking: Reported on 03/09/2023) 30 tablet 0   cholecalciferol  (VITAMIN D3) 25 MCG (1000 UNIT) tablet Take 5,000 Units by mouth daily.     furosemide  (LASIX ) 20 MG tablet TAKE 1 TABLET BY MOUTH DAILY 90 tablet 3   levothyroxine  (SYNTHROID ) 88 MCG tablet Take by mouth 88 mcg six days a week 75 tablet 3   MAGNESIUM GLUCONATE PO Take 2 tablets by mouth daily at 12 noon.     omeprazole  (PRILOSEC) 40 MG capsule Take 1 capsule (40 mg total) by mouth daily. 90 capsule 0   oxyCODONE  (ROXICODONE ) 5 MG immediate release tablet Take 1 tablet (5 mg total) by mouth every 4 (four) hours as needed for severe pain (pain score 7-10) or breakthrough pain. 10 tablet 0   Probiotic Product (CULTRELLE KIDS IMMUNE DEFENSE PO) Take 1 tablet by mouth daily.     UNABLE TO FIND Take 4 tablets by mouth daily. Med Name: Omega Pure 2 tablet in AM and 1 tablet in PM     UNABLE TO FIND Take by mouth daily. Med Name: Ultra Inflamix 2 scoops daily     UNABLE TO FIND Take 2 tablets by mouth daily. Med Name: R-Lipoic Acid     No current facility-administered medications  for this visit.   No results found.  Review of Systems:   A ROS was performed including pertinent positives and negatives as documented in the HPI.   Musculoskeletal Exam:    Last menstrual period 12/23/2021.  Right hip incisions are well-appearing without erythema or drainage.  30 degrees internal/external rotation of the right hip.  Walks with mild antalgic gait.  Distal neurosensory exam is intact  Imaging:      I personally reviewed and interpreted the radiographs.   Assessment:   2 weeks status post right hip labral repair.  Overall she is doing extremely well.  She will progress to advance her weight over the course of the next week.  She may discontinue her crutches as well.  I will plan to see  Plan :    -Return to clinic 4 weeks for reassessment      I personally saw and evaluated the patient, and participated in the management and treatment plan.  Elspeth Parker, MD Attending Physician, Orthopedic Surgery  This document was dictated using Dragon voice recognition software. A reasonable attempt at proof reading has been made to minimize errors.

## 2023-04-18 ENCOUNTER — Other Ambulatory Visit: Payer: Self-pay | Admitting: Internal Medicine

## 2023-04-18 DIAGNOSIS — K449 Diaphragmatic hernia without obstruction or gangrene: Secondary | ICD-10-CM

## 2023-04-18 DIAGNOSIS — R1310 Dysphagia, unspecified: Secondary | ICD-10-CM

## 2023-04-20 ENCOUNTER — Encounter (HOSPITAL_BASED_OUTPATIENT_CLINIC_OR_DEPARTMENT_OTHER): Payer: Self-pay | Admitting: Orthopaedic Surgery

## 2023-04-22 ENCOUNTER — Encounter: Payer: Self-pay | Admitting: Family Medicine

## 2023-04-22 ENCOUNTER — Encounter (HOSPITAL_BASED_OUTPATIENT_CLINIC_OR_DEPARTMENT_OTHER): Payer: Self-pay

## 2023-04-23 ENCOUNTER — Encounter: Payer: Self-pay | Admitting: *Deleted

## 2023-04-23 ENCOUNTER — Ambulatory Visit (HOSPITAL_BASED_OUTPATIENT_CLINIC_OR_DEPARTMENT_OTHER): Payer: Commercial Managed Care - PPO | Admitting: Physical Therapy

## 2023-04-23 DIAGNOSIS — M25551 Pain in right hip: Secondary | ICD-10-CM

## 2023-04-23 DIAGNOSIS — M6281 Muscle weakness (generalized): Secondary | ICD-10-CM

## 2023-04-23 DIAGNOSIS — M25651 Stiffness of right hip, not elsewhere classified: Secondary | ICD-10-CM

## 2023-04-23 DIAGNOSIS — R262 Difficulty in walking, not elsewhere classified: Secondary | ICD-10-CM

## 2023-04-23 NOTE — Therapy (Signed)
 OUTPATIENT PHYSICAL THERAPY TREATMENT   Patient Name: Valerie [name] MRN: [id number] DOB:1970-09-16, 53 y.o., female Today's Date: 04/23/2023  END OF SESSION:  PT End of Session - 04/23/23 1052     Visit Number 4    Number of Visits 25    Date for PT Re-Evaluation 06/27/22    Authorization Type UHC  no VL    PT Start Time 1100    PT Stop Time 1132    PT Time Calculation (min) 32 min    Activity Tolerance Patient tolerated treatment well    Behavior During Therapy WFL for tasks assessed/performed               Past Medical History:  Diagnosis Date   Abnormal echocardiogram 11/28/2021   EF 60 -65%, abnormal global longitudinal strain, see cardiology OV in Epic dated 12/25/21   Breast cancer (HCC)    Cancer (HCC) 09/2020   left breast, ER+, s/p radiation therapy 12/25/20 - 01/18/21, Patient follows with Dr. Vinay Gudena @ [facility].   COVID-19 11/2019   flu-like symptoms, cough, no hospitalizations   Edema    lower extremity, follows w/ [facility], Sherrilee Dinning, [place]. See OV dated 03/24/22 in Epic.   GERD (gastroesophageal reflux disease)    Follows w/ Dr. Rosario Guppy, gastroenterologist and PCP, Dr. Jenkins Jansky [facility].   Hashimoto's disease    Follows with Dr. Tawni Fendt @ [place].   History of hiatal hernia    2 cm hiatal hernia per 01/08/22 EGD   History of radiation therapy 2022   completed 01-18-2021   [facility] (hard of hearing)    has gotten bilateral aides.   Hypothyroidism    Follows w/ endocronologist, Dr. Tawni Fendt @ [place].   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 Charlie Crete @ [facility].   Optic neuritis    Follows w/ opthamology, Dr. 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), 04/2020 left eye   Personal history of radiation  therapy    PONV (postoperative nausea and vomiting)    Status post dilation of esophageal narrowing 01/08/2022   EGD, esophagus dilated   Wears glasses    Past Surgical History:  Procedure Laterality Date   ABDOMINAL HYSTERECTOMY  04/23/22   BIOPSY BREAST Left 10/17/2020   BREAST LUMPECTOMY     BREAST LUMPECTOMY WITH RADIOACTIVE SEED AND SENTINEL LYMPH NODE BIOPSY Left 11/15/2020   Procedure: LEFT BREAST LUMPECTOMY WITH RADIOACTIVE SEED AND LEFT SENTINEL LYMPH NODE BIOPSY;  Surgeon: Vanderbilt Ned, MD;  Location: [facility];  Service: General;  Laterality: Left;   BREAST SURGERY  August 2022   Cancer surgery   COLONOSCOPY  09/06/2021   cecal polyp   CT CORONARY CA SCORING  07/13/2020   Coronary Calcium = 0 (in Epic)   CYSTOSCOPY N/A 04/23/2022   Procedure: CYSTOSCOPY;  Surgeon: Pinal Ronal RAMAN, MD;  Location: [facility];  Service: Gynecology;  Laterality: N/A;   ESOPHAGOGASTRODUODENOSCOPY  01/08/2022   2cm hiatal hernia, esophagus dilated   TOTAL LAPAROSCOPIC HYSTERECTOMY WITH SALPINGECTOMY Bilateral 04/23/2022   Procedure: TOTAL LAPAROSCOPIC HYSTERECTOMY BILATERAL SALPINGECTOMY, LEFT OOPHORECTOMY;  Surgeon: Pinal Ronal RAMAN, MD;  Location: [facility];  Service: Gynecology;  Laterality: Bilateral;   TRANSTHORACIC ECHOCARDIOGRAM  11/28/2021   see results  in Epic   TRANSTHORACIC ECHOCARDIOGRAM  07/05/2020   in Epic   TUBAL LIGATION  02/2012   WISDOM TOOTH EXTRACTION     early 1990's   Patient Active Problem List   Diagnosis Date Noted   Genetic testing 07/04/2022   Family history of uterine cancer 06/24/2022   Local edema 02/25/2022   Hypothyroidism due to Hashimoto's thyroiditis 06/07/2021   Malignant neoplasm of lower-inner quadrant of left breast in female, estrogen receptor positive (HCC) 10/23/2020   Optic neuritis, left 10/18/2020   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) 07/10/2020   Mitral  regurgitation    Neuromyelitis optica (devic) (HCC) 05/07/2020   Hypothyroid 04/25/2020   Optic neuritis, right 04/24/2020     REFERRING PROVIDER:  Genelle Standing, MD    REFERRING DIAG:  [phone] (ICD-10-CM) - Tear of right acetabular labrum, initial encounter   s/p Rt acetabular labral repair  Rationale for Evaluation and Treatment: Rehabilitation  THERAPY DIAG:  Pain in right hip  Difficulty in walking, not elsewhere classified  Stiffness of right hip, not elsewhere classified  Muscle weakness (generalized)  ONSET DATE: DOS 03/31/23   SUBJECTIVE:                                                                                                                                                                                           SUBJECTIVE STATEMENT:   Pt states there no more NT. Pt notes more pain into the glute after standing and washing dishes. Pt did go to the gym the last two days doing UE and the bike.   Eval:  Feeling pretty good. Hip pain for 15 years- best I can think was after a hike with my daughter. Was actually seeing MD for knee when we looked at the hip. 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   PERTINENT HISTORY:  N/a  PAIN:  Are you having pain? No 0/10 into the R hip   PRECAUTIONS:  None  RED FLAGS: None   WEIGHT BEARING RESTRICTIONS:  Yes 2 wk TDWB  FALLS:  Has patient fallen in last 6 months? No  LIVING ENVIRONMENT: Lives with: lives with their spouse and 2 cats Lives in: House ranch with basement (washer/dryer & garage where she parks) Stairs: Yes: Internal: 12-14 steps; on left going up and External: 4 steps; on right going up Has following equipment at home: None  OCCUPATION:  works for [employer] including lifting up 25-30#, pushing & pulling   PLOF:  Independent  PATIENT GOALS:  Back to gym, improve flexibility   OBJECTIVE:  Note: Objective measures were completed at Evaluation unless  otherwise  noted.  PATIENT SURVEYS:  FOTO 25   GAIT: Eval: arrived with bil axillary crutches with brace, using crutches more for stability                                                                                                                             TREATMENT DATE:   1/9  Recumbent bike Lvl 1 5 min  STM R glute/ hip rotators Prone quad stretch 30s 4x Quadruped rocking 3s 2x10 Prone hip IR/ER 2x20 Clam with RTB 2x10 Standing hip flexor stretch 30s 3x     1/2  PROM to protocol limits LAD with circumduction  Bridge not past 0 2x10 Clam to parallel 2x10 PPT 20x Recumbent bike to 90 of hip flexion 5 min lvl 1  Wound inspection, review precautions   12/26  Bandage change and wound inspection- clean dry, mild erythema around both portals, small pimple -ike spot noted near superior portal site  PROM to protocol limits LAD with circumduction HEP verbal review Crutch sizing and gait training on steps with WB precautions    Treatment                            12/20 eval:  Supine glut set Prone position Prone glut sets Gait training with crutches Recumb bike fit & ride- able to make full revolutions Changed bandages- no s/s of infection    PATIENT EDUCATION:  Education details: Anatomy of condition, POC, HEP, exercise form/rationale Person educated: Patient Education method: Explanation, Demonstration, Tactile cues, Verbal cues, and Handouts Education comprehension: verbalized understanding, returned demonstration, verbal cues required, tactile cues required, and needs further education  HOME EXERCISE PROGRAM:  [email or web address]  Access Code: [email or web address]    ASSESSMENT:  CLINICAL IMPRESSION: Pt 3 wks at this time and cleared by MD to go FWB. Pt does present with minor R posterior hip and anterior thigh tightness from increase in walking but no recreation of pain during session. STM helped to imporve soft tissue extensibility of the posterior  hip. HEP updated today as pt is able to go beyond ROM precautions. Plan to continue with protocol at next. Consider sidestepping without resistance and kneeling/ half kneeling abdominal exercise as pt is active in the gym. Pt advised to avoid leg pressing or squatting type motions at this time. Pt would benefit from continued skilled therapy in order to reach goals and maximize functional R LE strength and ROM for full return to PLOF.      REHAB POTENTIAL: Good  CLINICAL DECISION MAKING: Stable/uncomplicated  EVALUATION COMPLEXITY: Low   GOALS: Goals reviewed with patient? Yes  SHORT TERM GOALS: Target date: 4 weeks 04/28/23  Pain free flexion to 90 Baseline: Goal status: INITIAL   2.  Demo controlled quad set with hold Baseline:  Goal status: INITIAL   3.  30s sit to stand without pain or compensation Baseline:  Goal status: INITIAL  4.  SLS 30s without compensation Baseline:  Goal status: INITIAL  LONG TERM GOALS:  Able to demo step up on 6 step with level pelvis and proper form Baseline:  Goal status: INITIAL Week 8 05/26/23  2.  Will tolerate at least 3 min on elliptical, demonstrating good tolerance to repetitive weight bearing motion Baseline:  Goal status: INITIAL  Week 8 05/26/23  3.  Demonstrate proper form in at least 10 continuous lunges without increased pain Baseline:  Goal status: INITIAL  Week 12 06/23/23  4.  Demonstrate gentle, double and single foot plyometric motions with good proximal form Baseline:  Goal status: INITIAL Week 12 06/23/23      PLAN:  PT FREQUENCY: 1-2x/week  PT DURATION: 12 weeks  PLANNED INTERVENTIONS: 97164- PT Re-evaluation, 97110-Therapeutic exercises, 97530- Therapeutic activity, 97112- Neuromuscular re-education, 97535- Self Care, [id number]- Manual therapy, [phone]- Gait training, [phone]- Aquatic Therapy, Patient/Family education, Balance training, Stair training, Taping, Dry Needling, Joint mobilization, Spinal mobilization,  Scar mobilization, Cryotherapy, and Moist heat.  PLAN FOR NEXT SESSION: per protocol   Dale Call PT, DPT 04/23/23 11:36 AM

## 2023-04-27 ENCOUNTER — Ambulatory Visit (HOSPITAL_BASED_OUTPATIENT_CLINIC_OR_DEPARTMENT_OTHER): Payer: Commercial Managed Care - PPO | Admitting: Physical Therapy

## 2023-04-27 ENCOUNTER — Encounter (HOSPITAL_BASED_OUTPATIENT_CLINIC_OR_DEPARTMENT_OTHER): Payer: Self-pay | Admitting: Physical Therapy

## 2023-04-27 DIAGNOSIS — M25551 Pain in right hip: Secondary | ICD-10-CM | POA: Diagnosis not present

## 2023-04-27 DIAGNOSIS — R262 Difficulty in walking, not elsewhere classified: Secondary | ICD-10-CM

## 2023-04-27 DIAGNOSIS — M25651 Stiffness of right hip, not elsewhere classified: Secondary | ICD-10-CM

## 2023-04-27 NOTE — Therapy (Signed)
 OUTPATIENT PHYSICAL THERAPY TREATMENT   Patient Name: Valerie [name] MRN: [id number] DOB:[birth year], 53 y.o., female Today's Date: 04/27/2023  END OF SESSION:  PT End of Session - 04/27/23 1435     Visit Number 5    Number of Visits 25    Date for PT Re-Evaluation 06/27/22    Authorization Type UHC  no VL    PT Start Time 1435    PT Stop Time 1519    PT Time Calculation (min) 44 min    Activity Tolerance Patient tolerated treatment well    Behavior During Therapy WFL for tasks assessed/performed                Past Medical History:  Diagnosis Date   Abnormal echocardiogram 11/28/2021   EF 60 -65%, abnormal global longitudinal strain, see cardiology OV in Epic dated 12/25/21   Breast cancer (HCC)    Cancer (HCC) 09/2020   left breast, ER+, s/p radiation therapy 12/25/20 - 01/18/21, Patient follows with Dr. Vinay Gudena @ [facility].   COVID-19 11/2019   flu-like symptoms, cough, no hospitalizations   Edema    lower extremity, follows w/ [facility], Sherrilee Dinning, [place]. See OV dated 03/24/22 in Epic.   GERD (gastroesophageal reflux disease)    Follows w/ Dr. Rosario Guppy, gastroenterologist and PCP, Dr. Jenkins Jansky [facility].   Hashimoto's disease    Follows with Dr. Tawni Fendt @ [place].   History of hiatal hernia    2 cm hiatal hernia per 01/08/22 EGD   History of radiation therapy 2022   completed 01-18-2021   [facility] (hard of hearing)    has gotten bilateral aides.   Hypothyroidism    Follows w/ endocronologist, Dr. Tawni Fendt @ [place].   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 Charlie Crete @ [facility].   Optic neuritis    Follows w/ opthamology, Dr. 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), 04/2020 left eye   Personal history of  radiation therapy    PONV (postoperative nausea and vomiting)    Status post dilation of esophageal narrowing 01/08/2022   EGD, esophagus dilated   Wears glasses    Past Surgical History:  Procedure Laterality Date   ABDOMINAL HYSTERECTOMY  04/23/22   BIOPSY BREAST Left 10/17/2020   BREAST LUMPECTOMY     BREAST LUMPECTOMY WITH RADIOACTIVE SEED AND SENTINEL LYMPH NODE BIOPSY Left 11/15/2020   Procedure: LEFT BREAST LUMPECTOMY WITH RADIOACTIVE SEED AND LEFT SENTINEL LYMPH NODE BIOPSY;  Surgeon: Vanderbilt Ned, MD;  Location:  SUR[facility]ERY CENTER;  Service: General;  Laterality: Left;   BREAST SURGERY  August 2022   Cancer surgery   COLONOSCOPY  09/06/2021   cecal polyp   CT CORONARY CA SCORING  07/13/2020   Coronary Calcium = 0 (in Epic)   CYSTOSCOPY N/A 04/23/2022   Procedure: CYSTOSCOPY;  Surgeon: Pinal Ronal RAMAN, MD;  Location: [facility];  Service: Gynecology;  Laterality: N/A;   ESOPHAGOGASTRODUODENOSCOPY  01/08/2022   2cm hiatal hernia, esophagus dilated   TOTAL LAPAROSCOPIC HYSTERECTOMY WITH SALPINGECTOMY Bilateral 04/23/2022   Procedure: TOTAL LAPAROSCOPIC HYSTERECTOMY BILATERAL SALPINGECTOMY, LEFT OOPHORECTOMY;  Surgeon: Pinal Ronal RAMAN, MD;  Location: [facility];  Service: Gynecology;  Laterality: Bilateral;   TRANSTHORACIC ECHOCARDIOGRAM  11/28/2021   see  results in Epic   TRANSTHORACIC ECHOCARDIOGRAM  07/05/2020   in Epic   TUBAL LIGATION  02/2012   WISDOM TOOTH EXTRACTION     early 1990's   Patient Active Problem List   Diagnosis Date Noted   Genetic testing 07/04/2022   Family history of uterine cancer 06/24/2022   Local edema 02/25/2022   Hypothyroidism due to Hashimoto's thyroiditis 06/07/2021   Malignant neoplasm of lower-inner quadrant of left breast in female, estrogen receptor positive (HCC) 10/23/2020   Optic neuritis, left 10/18/2020   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) 07/10/2020    Mitral regurgitation    Neuromyelitis optica (devic) (HCC) 05/07/2020   Hypothyroid 04/25/2020   Optic neuritis, right 04/24/2020     REFERRING PROVIDER:  Genelle Standing, MD    REFERRING DIAG:  [phone] (ICD-10-CM) - Tear of right acetabular labrum, initial encounter   s/p Rt acetabular labral repair  Rationale for Evaluation and Treatment: Rehabilitation  THERAPY DIAG:  Pain in right hip  Difficulty in walking, not elsewhere classified  Stiffness of right hip, not elsewhere classified  ONSET DATE: DOS 03/31/23   SUBJECTIVE:                                                                                                                                                                                           SUBJECTIVE STATEMENT:  Achey a lot today, first day back to work. I havent felt anything with the knee.   Eval:  Feeling pretty good. Hip pain for 15 years- best I can think was after a hike with my daughter. Was actually seeing MD for knee when we looked at the hip. 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   PERTINENT HISTORY:  N/a  PAIN:  Are you having pain? No 0/10 into the R hip   PRECAUTIONS:  None  RED FLAGS: None   WEIGHT BEARING RESTRICTIONS:  Yes 2 wk TDWB  FALLS:  Has patient fallen in last 6 months? No  LIVING ENVIRONMENT: Lives with: lives with their spouse and 2 cats Lives in: House [place] with basement (washer/dryer & garage where she parks) Stairs: Yes: Internal: 12-14 steps; on left going up and External: 4 steps; on right going up Has following equipment at home: None  OCCUPATION:  works for [employer] including lifting up 25-30#, pushing & pulling   PLOF:  Independent  PATIENT GOALS:  Back to gym, improve flexibility   OBJECTIVE:  Note: Objective measures were completed at Evaluation unless otherwise noted.  PATIENT SURVEYS:  FOTO 25   GAIT: Eval: arrived with bil axillary crutches with brace, using  crutches more for stability 1/13: using SPC when walking a lot                                                                                                                             TREATMENT DATE:   04/27/23 STG review Cable machine- tall kneel double & single arm row, straight arm press down, hinge with single arm row Tall mini lunge with single arm cable row & chest press Lateral walking with cable resistance.  STM hip flexors  1/9  Recumbent bike Lvl 1 5 min  STM R glute/ hip rotators Prone quad stretch 30s 4x Quadruped rocking 3s 2x10 Prone hip IR/ER 2x20 Clam with RTB 2x10 Standing hip flexor stretch 30s 3x     1/2  PROM to protocol limits LAD with circumduction  Bridge not past 0 2x10 Clam to parallel 2x10 PPT 20x Recumbent bike to 90 of hip flexion 5 min lvl 1  Wound inspection, review precautions    PATIENT EDUCATION:  Education details: Teacher, Music of condition, POC, HEP, exercise form/rationale Person educated: Patient Education method: Explanation, Demonstration, Tactile cues, Verbal cues, and Handouts Education comprehension: verbalized understanding, returned demonstration, verbal cues required, tactile cues required, and needs further education  HOME EXERCISE PROGRAM:  [email or web address]  Access Code: [email or web address]    ASSESSMENT:  CLINICAL IMPRESSION: Decreased tension with STM. Good tolerance to progression of cable machines to incorporate into her workout. Plans to do gym 3d/wk and pool 2d/wk- requested that she reduce hip flexor activation in pool walking & exercises.   *04/23/23: Pt advised to avoid leg pressing or squatting type motions at this time.      REHAB POTENTIAL: Good  CLINICAL DECISION MAKING: Stable/uncomplicated  EVALUATION COMPLEXITY: Low   GOALS: Goals reviewed with patient? Yes  SHORT TERM GOALS: Target date: 4 weeks 04/28/23  Pain free flexion to 90 Baseline: Goal status: achieved   2.  Demo controlled  quad set with hold Baseline:  Goal status: Achieved   3.  30s sit to stand without pain or compensation Baseline:  Goal status: achieved   4.  SLS 30s without compensation Baseline:  Goal status: achieved  LONG TERM GOALS:  Able to demo step up on 6 step with level pelvis and proper form Baseline:  Goal status: INITIAL Week 8 05/26/23  2.  Will tolerate at least 3 min on elliptical, demonstrating good tolerance to repetitive weight bearing motion Baseline:  Goal status: INITIAL  Week 8 05/26/23  3.  Demonstrate proper form in at least 10 continuous lunges without increased pain Baseline:  Goal status: INITIAL  Week 12 06/23/23  4.  Demonstrate gentle, double and single foot plyometric motions with good proximal form Baseline:  Goal status: INITIAL Week 12 06/23/23      PLAN:  PT FREQUENCY: 1-2x/week  PT DURATION: 12 weeks  PLANNED INTERVENTIONS: 97164- PT Re-evaluation, 97110-Therapeutic exercises, 97530- Therapeutic activity, 97112- Neuromuscular re-education, 97535- Self Care, 02859- Manual therapy, 02883-  Gait training, 02886- Aquatic Therapy, Patient/Family education, Balance training, Stair training, Taping, Dry Needling, Joint mobilization, Spinal mobilization, Scar mobilization, Cryotherapy, and Moist heat.  PLAN FOR NEXT SESSION: per protocol   Tariana Moldovan C. Santresa Levett PT, DPT 04/27/23 4:10 PM

## 2023-04-29 ENCOUNTER — Encounter (HOSPITAL_BASED_OUTPATIENT_CLINIC_OR_DEPARTMENT_OTHER): Payer: Self-pay | Admitting: Orthopaedic Surgery

## 2023-04-29 ENCOUNTER — Ambulatory Visit (HOSPITAL_BASED_OUTPATIENT_CLINIC_OR_DEPARTMENT_OTHER): Payer: Commercial Managed Care - PPO | Admitting: Physical Therapy

## 2023-04-29 ENCOUNTER — Encounter (HOSPITAL_BASED_OUTPATIENT_CLINIC_OR_DEPARTMENT_OTHER): Payer: Self-pay | Admitting: Physical Therapy

## 2023-04-29 DIAGNOSIS — M6281 Muscle weakness (generalized): Secondary | ICD-10-CM

## 2023-04-29 DIAGNOSIS — M25551 Pain in right hip: Secondary | ICD-10-CM | POA: Diagnosis not present

## 2023-04-29 DIAGNOSIS — R262 Difficulty in walking, not elsewhere classified: Secondary | ICD-10-CM

## 2023-04-29 DIAGNOSIS — M25651 Stiffness of right hip, not elsewhere classified: Secondary | ICD-10-CM

## 2023-04-29 NOTE — Therapy (Signed)
 OUTPATIENT PHYSICAL THERAPY TREATMENT   Patient Name: Valerie [name] MRN: [id number] DOB:06/13/1970, 53 y.o., female Today's Date: 04/29/2023  END OF SESSION:  PT End of Session - 04/29/23 1401     Visit Number 6    Number of Visits 25    Date for PT Re-Evaluation 06/27/22    Authorization Type UHC  no VL    PT Start Time 1316    PT Stop Time 1400    PT Time Calculation (min) 44 min    Activity Tolerance Patient tolerated treatment well    Behavior During Therapy WFL for tasks assessed/performed                 Past Medical History:  Diagnosis Date   Abnormal echocardiogram 11/28/2021   EF 60 -65%, abnormal global longitudinal strain, see cardiology OV in Epic dated 12/25/21   Breast cancer (HCC)    Cancer (HCC) 09/2020   left breast, ER+, s/p radiation therapy 12/25/20 - 01/18/21, Patient follows with Dr. Vinay Gudena @ [facility].   COVID-19 11/2019   flu-like symptoms, cough, no hospitalizations   Edema    lower extremity, follows w/ [facility], [place], [place]. See OV dated 03/24/22 in Epic.   GERD (gastroesophageal reflux disease)    Follows w/ Dr. Leslye Rast, gastroenterologist and PCP, Dr. Retha Cast [facility] @ [facility].   Hashimoto's disease    Follows with Dr. Marianna Shirk @ [place].   History of hiatal hernia    2 cm hiatal hernia per 01/08/22 EGD   History of radiation therapy 2022   completed 01-18-2021   [facility] (hard of hearing)    has gotten bilateral aides.   Hypothyroidism    Follows w/ endocronologist, Dr. Marianna Shirk @ [place].   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 Hortensia Ma @ [facility].   Optic neuritis    Follows w/ opthamology, Dr. Margurette Shillings.03/2020 right eye (almost complete blindness), 04/2020 left eye   Personal history of  radiation therapy    PONV (postoperative nausea and vomiting)    Status post dilation of esophageal narrowing 01/08/2022   EGD, esophagus dilated   Wears glasses    Past Surgical History:  Procedure Laterality Date   ABDOMINAL HYSTERECTOMY  04/23/22   BIOPSY BREAST Left 10/17/2020   BREAST LUMPECTOMY     BREAST LUMPECTOMY WITH RADIOACTIVE SEED AND SENTINEL LYMPH NODE BIOPSY Left 11/15/2020   Procedure: LEFT BREAST LUMPECTOMY WITH RADIOACTIVE SEED AND LEFT SENTINEL LYMPH NODE BIOPSY;  Surgeon: Sim Dryer, MD;  Location: [facility];  Service: General;  Laterality: Left;   BREAST SURGERY  August 2022   Cancer surgery   COLONOSCOPY  09/06/2021   cecal polyp   CT CORONARY CA SCORING  07/13/2020   Coronary Calcium = 0 (in Epic)   CYSTOSCOPY N/A 04/23/2022   Procedure: CYSTOSCOPY;  Surgeon: Lillian Rein, MD;  Location: [facility];  Service: Gynecology;  Laterality: N/A;   ESOPHAGOGASTRODUODENOSCOPY  01/08/2022   2cm hiatal hernia, esophagus dilated   TOTAL LAPAROSCOPIC HYSTERECTOMY WITH SALPINGECTOMY Bilateral 04/23/2022   Procedure: TOTAL LAPAROSCOPIC HYSTERECTOMY BILATERAL SALPINGECTOMY, LEFT OOPHORECTOMY;  Surgeon: Lillian Rein, MD;  Location: [facility];  Service: Gynecology;  Laterality: Bilateral;   TRANSTHORACIC ECHOCARDIOGRAM  11/28/2021  see results in Epic   TRANSTHORACIC ECHOCARDIOGRAM  07/05/2020   in Epic   TUBAL LIGATION  02/2012   WISDOM TOOTH EXTRACTION     early 1990's   Patient Active Problem List   Diagnosis Date Noted   Genetic testing 07/04/2022   Family history of uterine cancer 06/24/2022   Local edema 02/25/2022   Hypothyroidism due to Hashimoto's thyroiditis 06/07/2021   Malignant neoplasm of lower-inner quadrant of left breast in female, estrogen receptor positive (HCC) 10/23/2020   Optic neuritis, left 10/18/2020   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) 07/10/2020    Mitral regurgitation    Neuromyelitis optica (devic) (HCC) 05/07/2020   Hypothyroid 04/25/2020   Optic neuritis, right 04/24/2020     REFERRING PROVIDER:  Wilhelmenia Harada, MD    REFERRING DIAG:  [phone] (ICD-10-CM) - Tear of right acetabular labrum, initial encounter   s/p Rt acetabular labral repair  Rationale for Evaluation and Treatment: Rehabilitation  THERAPY DIAG:  Pain in right hip  Difficulty in walking, not elsewhere classified  Stiffness of right hip, not elsewhere classified  Muscle weakness (generalized)  ONSET DATE: DOS 03/31/23   SUBJECTIVE:                                                                                                                                                                                           SUBJECTIVE STATEMENT:  Pt states that the hip flexor is still tight. It is achey into the back of the hip today. Pt tried cable exercise today and would like review.   Eval:  Feeling pretty good. Hip pain for 15 years- best I can think was after a hike with my daughter. Was actually seeing MD for knee when we looked at the hip. 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   PERTINENT HISTORY:  N/a  PAIN:  Are you having pain? No 0/10 into the R hip   PRECAUTIONS:  None  RED FLAGS: None   WEIGHT BEARING RESTRICTIONS:  Yes 2 wk TDWB  FALLS:  Has patient fallen in last 6 months? No  LIVING ENVIRONMENT: Lives with: lives with their spouse and 2 cats Lives in: [place] with basement (washer/dryer & garage where she parks) Stairs: Yes: Internal: 12-14 steps; on left going up and External: 4 steps; on right going up Has following equipment at home: None  OCCUPATION:  works for [employer] including lifting up 25-30#, pushing & pulling   PLOF:  Independent  PATIENT GOALS:  Back to gym, improve flexibility   OBJECTIVE:  Note: Objective measures were completed at Evaluation unless otherwise  noted.  PATIENT SURVEYS:  FOTO 25   GAIT: Eval: arrived with bil axillary crutches with brace, using crutches more for stability 1/13: using SPC when walking a lot                                                                                                                             TREATMENT DATE:   1/15  Recumbent bike Lvl 1 5 min  Standing hip flexor stretch 30s 2x  Tall kneeling shoulder ext on cable 3x10 Cable staggered stance chest press cable Sidestepping no resistance   Review of gym activity- able to do knee ext, HS curl, and calf machine, no ABD yet but able to do small range ADD machine  R hip inf glide (scoop mob) grade III   04/27/23 STG review Cable machine- tall kneel double & single arm row, straight arm press down, hinge with single arm row Tall mini lunge with single arm cable row & chest press Lateral walking with cable resistance.  STM hip flexors  1/9  Recumbent bike Lvl 1 5 min  STM R glute/ hip rotators Prone quad stretch 30s 4x Quadruped rocking 3s 2x10 Prone hip IR/ER 2x20 Clam with RTB 2x10 Standing hip flexor stretch 30s 3x     1/2  PROM to protocol limits LAD with circumduction  Bridge not past 0 2x10 Clam to parallel 2x10 PPT 20x Recumbent bike to 90 of hip flexion 5 min lvl 1  Wound inspection, review precautions    PATIENT EDUCATION:  Education details: Teacher, music of condition, POC, HEP, exercise form/rationale Person educated: Patient Education method: Explanation, Demonstration, Tactile cues, Verbal cues, and Handouts Education comprehension: verbalized understanding, returned demonstration, verbal cues required, tactile cues required, and needs further education  HOME EXERCISE PROGRAM:  [email or web address]  Access Code: [email or web address]    ASSESSMENT:  CLINICAL IMPRESSION: Pt 4 wks post op. Pt session used to review previous cable exercises and to modify current HEP. Cable walks painful so regressed to  sidestepping without bands. Pt does have hip extension tightness today but pain likely due to increase in foot contact with work related activity. 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 Pt had reduction of pain with hip mobilization and reduction of C-sign type pain. Plan to review knee ext, HS curl, and calf raise machine at next as verbal instructions were given to pt and spouse. Pt advised that there should be minimal to no pain with her gym related exercise. Plan to continue per hip labral protocol.  *04/23/23: Pt advised to avoid leg pressing or squatting type motions at this time.      REHAB POTENTIAL: Good  CLINICAL DECISION MAKING: Stable/uncomplicated  EVALUATION COMPLEXITY: Low   GOALS: Goals reviewed with patient? Yes  SHORT TERM GOALS: Target date: 4 weeks 04/28/23  Pain free flexion to 90 Baseline: Goal status: achieved   2.  Demo controlled quad  set with hold Baseline:  Goal status: Achieved   3.  30s sit to stand without pain or compensation Baseline:  Goal status: achieved   4.  SLS 30s without compensation Baseline:  Goal status: achieved  LONG TERM GOALS:  Able to demo step up on 6" step with level pelvis and proper form Baseline:  Goal status: INITIAL Week 8 05/26/23  2.  Will tolerate at least 3 min on elliptical, demonstrating good tolerance to repetitive weight bearing motion Baseline:  Goal status: INITIAL  Week 8 05/26/23  3.  Demonstrate proper form in at least 10 continuous lunges without increased pain Baseline:  Goal status: INITIAL  Week 12 06/23/23  4.  Demonstrate gentle, double and single foot plyometric motions with good proximal form Baseline:  Goal status: INITIAL Week 12 06/23/23      PLAN:  PT FREQUENCY: 1-2x/week  PT DURATION: 12 weeks  PLANNED INTERVENTIONS: 97164- PT Re-evaluation, 97110-Therapeutic exercises, 97530- Therapeutic activity,  97112- Neuromuscular re-education, 97535- Self Care, 11914- Manual therapy, [phone]- Gait training, [phone]- Aquatic Therapy, Patient/Family education, Balance training, Stair training, Taping, Dry Needling, Joint mobilization, Spinal mobilization, Scar mobilization, Cryotherapy, and Moist heat.  PLAN FOR NEXT SESSION: per protocol  Silver Dross PT, DPT 04/29/23 2:05 PM

## 2023-05-04 ENCOUNTER — Ambulatory Visit (HOSPITAL_BASED_OUTPATIENT_CLINIC_OR_DEPARTMENT_OTHER): Payer: Commercial Managed Care - PPO | Admitting: Physical Therapy

## 2023-05-04 ENCOUNTER — Encounter (HOSPITAL_BASED_OUTPATIENT_CLINIC_OR_DEPARTMENT_OTHER): Payer: Self-pay | Admitting: Physical Therapy

## 2023-05-04 DIAGNOSIS — M25551 Pain in right hip: Secondary | ICD-10-CM

## 2023-05-04 DIAGNOSIS — M25651 Stiffness of right hip, not elsewhere classified: Secondary | ICD-10-CM

## 2023-05-04 DIAGNOSIS — M6281 Muscle weakness (generalized): Secondary | ICD-10-CM

## 2023-05-04 DIAGNOSIS — R262 Difficulty in walking, not elsewhere classified: Secondary | ICD-10-CM

## 2023-05-04 NOTE — Therapy (Unsigned)
OUTPATIENT PHYSICAL THERAPY TREATMENT   Patient Name: Valerie [name] MRN: [id number] DOB:1970/12/08, 53 y.o., female Today's Date: 05/05/2023  END OF SESSION:  PT End of Session - 05/04/23 1309     Visit Number 7    Number of Visits 25    Date for PT Re-Evaluation 06/27/22    Authorization Type UHC  no VL    PT Start Time 1315    PT Stop Time 1359    PT Time Calculation (min) 44 min    Activity Tolerance Patient tolerated treatment well    Behavior During Therapy WFL for tasks assessed/performed                 Past Medical History:  Diagnosis Date   Abnormal echocardiogram 11/28/2021   EF 60 -65%, abnormal global longitudinal strain, see cardiology OV in Epic dated 12/25/21   Breast cancer (HCC)    Cancer (HCC) 09/2020   left breast, ER+, s/p radiation therapy 12/25/20 - 01/18/21, Patient follows with Dr. Serena Croissant @ [facility].   COVID-19 11/2019   flu-like symptoms, cough, no hospitalizations   Edema    lower extremity, follows w/ [facility], [place], [place]. See OV dated 03/24/22 in Epic.   GERD (gastroesophageal reflux disease)    Follows w/ Dr. Judeen Hammans, gastroenterologist and PCP, Dr. Maryruth Hancock [facility] @ [facility].   Hashimoto's disease    Follows with Dr. Ernest Haber @ [place].   History of hiatal hernia    2 cm hiatal hernia per 01/08/22 EGD   History of radiation therapy 2022   completed 01-18-2021   [facility] (hard of hearing)    has gotten bilateral aides.   Hypothyroidism    Follows w/ endocronologist, Dr. Ernest Haber @ [place] [facility].   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 Despina Arias @ [facility].   Optic neuritis    Follows w/ opthamology, Dr. Mora Appl.03/2020 right eye (almost complete blindness), 04/2020 left eye   Personal history of  radiation therapy    PONV (postoperative nausea and vomiting)    Status post dilation of esophageal narrowing 01/08/2022   EGD, esophagus dilated   Wears glasses    Past Surgical History:  Procedure Laterality Date   ABDOMINAL HYSTERECTOMY  04/23/22   BIOPSY BREAST Left 10/17/2020   BREAST LUMPECTOMY     BREAST LUMPECTOMY WITH RADIOACTIVE SEED AND SENTINEL LYMPH NODE BIOPSY Left 11/15/2020   Procedure: LEFT BREAST LUMPECTOMY WITH RADIOACTIVE SEED AND LEFT SENTINEL LYMPH NODE BIOPSY;  Surgeon: Harriette Bouillon, MD;  Location: [facility];  Service: General;  Laterality: Left;   BREAST SURGERY  August 2022   Cancer surgery   COLONOSCOPY  09/06/2021   cecal polyp   CT CORONARY CA SCORING  07/13/2020   Coronary Calcium = 0 (in Epic)   CYSTOSCOPY N/A 04/23/2022   Procedure: CYSTOSCOPY;  Surgeon: Jerene Bears, MD;  Location: [facility];  Service: Gynecology;  Laterality: N/A;   ESOPHAGOGASTRODUODENOSCOPY  01/08/2022   2cm hiatal hernia, esophagus dilated   TOTAL LAPAROSCOPIC HYSTERECTOMY WITH SALPINGECTOMY Bilateral 04/23/2022   Procedure: TOTAL LAPAROSCOPIC HYSTERECTOMY BILATERAL SALPINGECTOMY, LEFT OOPHORECTOMY;  Surgeon: Jerene Bears, MD;  Location: [facility];  Service: Gynecology;  Laterality: Bilateral;   TRANSTHORACIC ECHOCARDIOGRAM  11/28/2021  see results in Epic   TRANSTHORACIC ECHOCARDIOGRAM  07/05/2020   in Epic   TUBAL LIGATION  02/2012   WISDOM TOOTH EXTRACTION     early 1990's   Patient Active Problem List   Diagnosis Date Noted   Genetic testing 07/04/2022   Family history of uterine cancer 06/24/2022   Local edema 02/25/2022   Hypothyroidism due to Hashimoto's thyroiditis 06/07/2021   Malignant neoplasm of lower-inner quadrant of left breast in female, estrogen receptor positive (HCC) 10/23/2020   Optic neuritis, left 10/18/2020   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) 07/10/2020    Mitral regurgitation    Neuromyelitis optica (devic) (HCC) 05/07/2020   Hypothyroid 04/25/2020   Optic neuritis, right 04/24/2020     REFERRING PROVIDER:  Huel Cote, MD    REFERRING DIAG:  [phone] (ICD-10-CM) - Tear of right acetabular labrum, initial encounter   s/p Rt acetabular labral repair  Rationale for Evaluation and Treatment: Rehabilitation  THERAPY DIAG:  Pain in right hip  Difficulty in walking, not elsewhere classified  Stiffness of right hip, not elsewhere classified  Muscle weakness (generalized)  ONSET DATE: DOS 03/31/23   SUBJECTIVE:                                                                                                                                                                                           SUBJECTIVE STATEMENT:  Have been doing half days, hip flexor tightness.   Eval:  Feeling pretty good. Hip pain for 15 years- best I can think was after a hike with my daughter. Was actually seeing MD for knee when we looked at the hip. 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   PERTINENT HISTORY:  N/a  PAIN:  Are you having pain? No 0/10 into the R hip   PRECAUTIONS:  None  RED FLAGS: None   WEIGHT BEARING RESTRICTIONS:  Yes 2 wk TDWB  FALLS:  Has patient fallen in last 6 months? No  LIVING ENVIRONMENT: Lives with: lives with their spouse and 2 cats Lives in: [place] with basement (washer/dryer & garage where she parks) Stairs: Yes: Internal: 12-14 steps; on left going up and External: 4 steps; on right going up Has following equipment at home: None  OCCUPATION:  works for [employer] including lifting up 25-30#, pushing & pulling   PLOF:  Independent  PATIENT GOALS:  Back to gym, improve flexibility   OBJECTIVE:  Note: Objective measures were completed at Evaluation unless otherwise noted.  PATIENT SURVEYS:  FOTO 25   GAIT: Eval: arrived with bil axillary crutches with brace, using  crutches more for  stability 1/13: using SPC when walking a lot                                                                                                                             TREATMENT DATE:   05/04/2023 Tall kneeling glut set- lacking 12 deg hip extension; standing -6 deg hip extension Tall kneel + Rt GHJ flexion Tall kneeling hinge back Tall kneeling bridge Standing lunge + Rt shoulder flexion green tband resist, added heel raise with knee extended STM hip flexors  1/15  Recumbent bike Lvl 1 5 min  Standing hip flexor stretch 30s 2x  Tall kneeling shoulder ext on cable 3x10 Cable staggered stance chest press cable Sidestepping no resistance   Review of gym activity- able to do knee ext, HS curl, and calf machine, no ABD yet but able to do small range ADD machine  R hip inf glide (scoop mob) grade III   04/27/23 STG review Cable machine- tall kneel double & single arm row, straight arm press down, hinge with single arm row Tall mini lunge with single arm cable row & chest press Lateral walking with cable resistance.  STM hip flexors   PATIENT EDUCATION:  Education details: Anatomy of condition, POC, HEP, exercise form/rationale Person educated: Patient Education method: Explanation, Demonstration, Tactile cues, Verbal cues, and Handouts Education comprehension: verbalized understanding, returned demonstration, verbal cues required, tactile cues required, and needs further education  HOME EXERCISE PROGRAM:  [email or web address]  Access Code: [email or web address]    ASSESSMENT:  CLINICAL IMPRESSION: Lacking bilateral hip extension resulting in sustained shortening of hip flexors and difficulty utilizing glutes activation.  Will continue to encourage flexibility.  Patient has returned to work and nearly doubled her step count during the day.  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  *04/23/23: Pt advised  to avoid leg pressing or squatting type motions at this time.      REHAB POTENTIAL: Good  CLINICAL DECISION MAKING: Stable/uncomplicated  EVALUATION COMPLEXITY: Low   GOALS: Goals reviewed with patient? Yes  SHORT TERM GOALS: Target date: 4 weeks 04/28/23  Pain free flexion to 90 Baseline: Goal status: achieved   2.  Demo controlled quad set with hold Baseline:  Goal status: Achieved   3.  30s sit to stand without pain or compensation Baseline:  Goal status: achieved   4.  SLS 30s without compensation Baseline:  Goal status: achieved  LONG TERM GOALS:  Able to demo step up on 6" step with level pelvis and proper form Baseline:  Goal status: INITIAL Week 8 05/26/23  2.  Will tolerate at least 3 min on elliptical, demonstrating good tolerance to repetitive weight bearing motion Baseline:  Goal status: INITIAL  Week 8 05/26/23  3.  Demonstrate proper form in at least 10 continuous lunges without increased pain Baseline:  Goal status: INITIAL  Week 12 06/23/23  4.  Demonstrate gentle,  double and single foot plyometric motions with good proximal form Baseline:  Goal status: INITIAL Week 12 06/23/23      PLAN:  PT FREQUENCY: 1-2x/week  PT DURATION: 12 weeks  PLANNED INTERVENTIONS: 97164- PT Re-evaluation, 97110-Therapeutic exercises, 97530- Therapeutic activity, 97112- Neuromuscular re-education, 97535- Self Care, 95621- Manual therapy, [phone]- Gait training, [phone]- Aquatic Therapy, Patient/Family education, Balance training, Stair training, Taping, Dry Needling, Joint mobilization, Spinal mobilization, Scar mobilization, Cryotherapy, and Moist heat.  PLAN FOR NEXT SESSION: per protocol  Tabithia Stroder C. Merlin Golden PT, DPT 05/05/23 7:36 AM

## 2023-05-06 ENCOUNTER — Encounter: Payer: Self-pay | Admitting: Neurology

## 2023-05-06 ENCOUNTER — Ambulatory Visit (INDEPENDENT_AMBULATORY_CARE_PROVIDER_SITE_OTHER): Payer: Commercial Managed Care - PPO | Admitting: Neurology

## 2023-05-06 ENCOUNTER — Encounter (HOSPITAL_BASED_OUTPATIENT_CLINIC_OR_DEPARTMENT_OTHER): Payer: Self-pay | Admitting: Physical Therapy

## 2023-05-06 VITALS — BP 122/78 | HR 86 | Ht 65.0 in | Wt 189.0 lb

## 2023-05-06 DIAGNOSIS — H469 Unspecified optic neuritis: Secondary | ICD-10-CM

## 2023-05-06 DIAGNOSIS — G3781 Myelin oligodendrocyte glycoprotein antibody disease: Secondary | ICD-10-CM

## 2023-05-06 DIAGNOSIS — Z79899 Other long term (current) drug therapy: Secondary | ICD-10-CM | POA: Diagnosis not present

## 2023-05-06 DIAGNOSIS — C50312 Malignant neoplasm of lower-inner quadrant of left female breast: Secondary | ICD-10-CM | POA: Diagnosis not present

## 2023-05-06 DIAGNOSIS — Z170 Estrogen receptor positive status [ER+]: Secondary | ICD-10-CM

## 2023-05-06 NOTE — Progress Notes (Signed)
[facility]  PATIENT: Valerie [name] DOB: 12/14/1970  REFERRING DOCTOR OR PCP:  Dr. Redgie Grayer SOURCE: Patient, notes from recent hospitalization, laboratory results, MRI images personally reviewed.  _________________________________   HISTORICAL  CHIEF COMPLAINT:  Chief Complaint  Patient presents with   Room 10    Pt is here Alone. Pt states that she has been doing pretty good since her last appointment. Pt states that the past 2 weeks when she takes her medication she gets extremely tired. Pt states that she has ocular headaches.     HISTORY OF PRESENT ILLNESS:  Valerie [name], is a 53 y.o. 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   She gets free drug through [place].  She notes feeling tired after some of her shots.   Vision is doing better now.     She saw ophthalmology Mora Appl) in October 2024 and exam and OCT are stable.     Vision improved at or near baseline.  She had three episodes of ON, the right December 2021 and the left 05/05/2020 and left January 2023 (while on Rituxan).   The left optic neuritis in 2020 was a worse episode with almost complete blindness.  Vision has improved near 20/20.   Colors are mildly desaturated OS and visual resolution seems worse OD.     She had an episode of vertigo May 2024 lasting one entire day.  She saw ENT and no pathology seen.  She notes if she looks around quickly she feels slight vertigo.      She notes some itching in the eyes .  Feels like soemting is in her eyes.  Eyedrops help.    She has not had any spinal cord syndrome.  Gait is good (though using cane due to THR 5 wks ago).   Sometimes she drops items but denies any significant weakness.    She denies any change in her gait or balance now but had some issues in January.   She notes no difficulty with strength or sensation.  Bladder function is fine.  She notes occasionally brain  fog.   She sleeps well.     She had calcifications on the left breast and had a biopsy.   She was diagnosed with left breast cancer summer 2022 - she did a biopsy, followed by surgery and radiation.Marland Kitchen  Lymph nodes were clean at surgery.   She is on Tamoxifen  (will take x 5 years).    Last mammogram was fine.   She has lymphedema.  She had a hysterectomy 04/23/2022 .  She had squamous metaplasia - no actual cancer.   She sees Dr. Pamelia Hoit.  She had hip surgery in December and is using a cane     HISTORY MOGAD / ON She  had right >> left eye pain at the end of December 2021.  On 04/20/2020 she began to have reduced vision OD and photophobia.    She saw an ophthalmologist who felt she had dry eyes.   She saw Dr. Briscoe Burns the next day who diagnosed her with right optic neuritis.  She was referred to Dr. Allyne Gee who did OCT c/with optic neuritis.   She was admittted 04/24/2020 to 04/29/2020 and received 5 days IV Solu-Medrol.   Vision improved with the steroids.   Upon discharge, she still had reduced visual acuity but good color vision.   She had only mild eye aching  and mild photophobia.    On 05/05/2020,  she had aching in the left eye followed by complete loss of vision.  Steroids helped.  I initially placed her on azathioprine but she had trouble tolerating it.  We were able to get her on Rituxan.  She had another visual optic neuritis exacerbation early 2023 prompting switch from Rituxan to Actemra.  Around the time she started March 2023 she noted slight visual blurring and eye temporarily increase her prednisone.  She was able to completely come off of prednisone and May 2023.    Data review: Imaging: MRI of the brain 04/24/2020 was normal.  MRI of the orbits 04/24/2020 showed abnormal signal with enhancement in the right optic nerve consistent with optic neuritis.  MRI of the cervical spine and MRI of the thoracic spine 04/24/2020 showed normal spinal cord and some degenerative changes with moderate  right foraminal narrowing at C5-C6.  Laboratory data: Labs 04/25/2020:   Anti-MOG antibodies were high titer positive (1: 320).  HIV was negative.   ANA/Sjogrn's antibodies were negative.   RPR was negative,  Anti-NMO Ab was negative,   HgbA1c was normal 5.4.  Sugars ranged 110-183.      REVIEW OF SYSTEMS: Constitutional: No fevers, chills, sweats, or change in appetite Eyes: No visual changes, double vision, eye pain Ear, nose and throat: No hearing loss, ear pain, nasal congestion, sore throat Cardiovascular: No chest pain, palpitations Respiratory:  No shortness of breath at rest or with exertion.   No wheezes GastrointestinaI: No nausea, vomiting, diarrhea, abdominal pain, fecal incontinence Genitourinary:  No dysuria, urinary retention or frequency.  No nocturia. Musculoskeletal:  No neck pain, back pain Integumentary: No rash, pruritus, skin lesions Neurological: as above Psychiatric: No depression at this time.  No anxiety Endocrine: No palpitations, diaphoresis, change in appetite, change in weigh or increased thirst Hematologic/Lymphatic:  No anemia, purpura, petechiae. Allergic/Immunologic: No itchy/runny eyes, nasal congestion, recent allergic reactions, rashes  ALLERGIES: No Known Allergies  HOME MEDICATIONS:  Current Outpatient Medications:    ACTEMRA 162 MG/0.9ML SOSY, Inject 0.9 mLs (162 mg total) into the skin once a week. For MOGAD Takes weekly on Saturday., Disp: 10.8 mL, Rfl: 1   ascorbic acid (VITAMIN C) 1000 MG tablet, Take 1 tablet by mouth daily., Disp: , Rfl:    cholecalciferol (VITAMIN D3) 25 MCG (1000 UNIT) tablet, Take 5,000 Units by mouth daily., Disp: , Rfl:    furosemide (LASIX) 20 MG tablet, TAKE 1 TABLET BY MOUTH DAILY, Disp: 90 tablet, Rfl: 3   levothyroxine (SYNTHROID) 88 MCG tablet, Take by mouth 88 mcg six days a week, Disp: 75 tablet, Rfl: 3   MAGNESIUM GLUCONATE PO, Take 2 tablets by mouth daily at 12 noon., Disp: , Rfl:    omeprazole (PRILOSEC)  20 MG capsule, TAKE 1 CAPSULE BY MOUTH DAILY, Disp: 90 capsule, Rfl: 0   Probiotic Product (CULTRELLE KIDS IMMUNE DEFENSE PO), Take 1 tablet by mouth daily., Disp: , Rfl:    UNABLE TO FIND, Take 4 tablets by mouth daily. Med Name: Omega Pure 2 tablet in AM and 1 tablet in PM, Disp: , Rfl:    UNABLE TO FIND, Take by mouth daily. Med Name: Ultra Inflamix 2 scoops daily, Disp: , Rfl:    UNABLE TO FIND, Take 2 tablets by mouth daily. Med Name: R-Lipoic Acid, Disp: , Rfl:    aspirin EC 325 MG tablet, Take 1 tablet (325 mg total) by mouth daily. (Patient not taking: Reported on 03/09/2023), Disp:  30 tablet, Rfl: 0   oxyCODONE (ROXICODONE) 5 MG immediate release tablet, Take 1 tablet (5 mg total) by mouth every 4 (four) hours as needed for severe pain (pain score 7-10) or breakthrough pain., Disp: 10 tablet, Rfl: 0  PAST MEDICAL HISTORY: Past Medical History:  Diagnosis Date   Abnormal echocardiogram 11/28/2021   EF 60 -65%, abnormal global longitudinal strain, see cardiology OV in Epic dated 12/25/21   Breast cancer (HCC)    Cancer (HCC) 09/2020   left breast, ER+, s/p radiation therapy 12/25/20 - 01/18/21, Patient follows with Dr. Serena Croissant @ [facility].   COVID-19 11/2019   flu-like symptoms, cough, no hospitalizations   Edema    lower extremity, follows w/ [facility], Sabino Dick, [place]. See OV dated 03/24/22 in Epic.   GERD (gastroesophageal reflux disease)    Follows w/ Dr. Judeen Hammans, gastroenterologist and PCP, Dr. Maryruth Hancock [facility] @ [facility].   Hashimoto's disease    Follows with Dr. Ernest Haber @ [place].   History of hiatal hernia    2 cm hiatal hernia per 01/08/22 EGD   History of radiation therapy 2022   completed 01-18-2021   [facility] (hard of hearing)    has gotten bilateral aides.   Hypothyroidism    Follows w/ endocronologist, Dr. Ernest Haber @ [place].   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 Despina Arias @ [facility].   Optic neuritis    Follows w/ opthamology, Dr. Mora Appl.03/2020 right eye (almost complete blindness), 04/2020 left eye   Personal history of radiation therapy    PONV (postoperative nausea and vomiting)    Status post dilation of esophageal narrowing 01/08/2022   EGD, esophagus dilated   Wears glasses     PAST SURGICAL HISTORY: Past Surgical History:  Procedure Laterality Date   ABDOMINAL HYSTERECTOMY  04/23/22   BIOPSY BREAST Left 10/17/2020   BREAST LUMPECTOMY     BREAST LUMPECTOMY WITH RADIOACTIVE SEED AND SENTINEL LYMPH NODE BIOPSY Left 11/15/2020   Procedure: LEFT BREAST LUMPECTOMY WITH RADIOACTIVE SEED AND LEFT SENTINEL LYMPH NODE BIOPSY;  Surgeon: Harriette Bouillon, MD;  Location: [facility];  Service: General;  Laterality: Left;   BREAST SURGERY  August 2022   Cancer surgery   COLONOSCOPY  09/06/2021   cecal polyp   CT CORONARY CA SCORING  07/13/2020   Coronary Calcium = 0 (in Epic)   CYSTOSCOPY N/A 04/23/2022   Procedure: CYSTOSCOPY;  Surgeon: Jerene Bears, MD;  Location: [facility];  Service: Gynecology;  Laterality: N/A;   ESOPHAGOGASTRODUODENOSCOPY  01/08/2022   2cm hiatal hernia, esophagus dilated   TOTAL LAPAROSCOPIC HYSTERECTOMY WITH SALPINGECTOMY Bilateral 04/23/2022   Procedure: TOTAL LAPAROSCOPIC HYSTERECTOMY BILATERAL SALPINGECTOMY, LEFT OOPHORECTOMY;  Surgeon: Jerene Bears, MD;  Location: [facility];  Service: Gynecology;  Laterality: Bilateral;   TRANSTHORACIC ECHOCARDIOGRAM  11/28/2021   see results in Epic   TRANSTHORACIC ECHOCARDIOGRAM  07/05/2020   in Epic   TUBAL LIGATION  02/2012   WISDOM TOOTH EXTRACTION     early 1990's    FAMILY HISTORY: Family History  Problem Relation Age of Onset   Hearing loss Mother    Thyroid disease Mother    Cancer Mother 32        endometrial cancer, lynch negative  GER disease Mother    Other Mother        uterine fibroids, optic disc abnormality    Eczema Mother    Colon polyps Mother    Hypothyroidism Mother    Osteoarthritis Mother    Hypertension Mother    Migraines Mother    Mitral valve prolapse Father        regurgitation/prolase   Colon polyps Father    Hearing loss Father    Other Father        Colon adenoma   Hyperlipidemia Father    GER disease Father    Heart murmur Father    Amblyopia Father        Right eye   Neuropathy Father    Aortic aneurysm Father    Heart murmur Brother    Glaucoma Maternal Grandmother    Macular degeneration Maternal Grandmother    Arthritis Maternal Grandmother    Arthritis Maternal Grandfather    Multiple sclerosis Paternal Grandmother    Heart murmur Paternal Grandfather    Heart disease Paternal Grandfather    Diabetes Maternal Aunt    Heart murmur Paternal Aunt    Breast cancer Neg Hx    Colon cancer Neg Hx    Esophageal cancer Neg Hx    Rectal cancer Neg Hx    Stomach cancer Neg Hx     SOCIAL HISTORY:  Social History   Socioeconomic History   Marital status: Married    Spouse name: Not on file   Number of children: 2   Years of education: BS   Highest education level: Bachelor's degree (e.g., BA, AB, BS)  Occupational History   Occupation: Psychologist, occupational   Occupation: Shipping and receiving clerk  Tobacco Use   Smoking status: Never   Smokeless tobacco: Not on file  Vaping Use   Vaping status: Never Used  Substance and Sexual Activity   Alcohol use: Never   Drug use: Never   Sexual activity: Yes    Birth control/protection: Surgical, None    Comment: hysterectomy  Other Topics Concern   Not on file  Social History Narrative   Right handed    Caffeine use: rare   Social Drivers of Health   Financial Resource Strain: Low Risk  (03/05/2023)   Overall Financial Resource Strain (CARDIA)    Difficulty of Paying Living  Expenses: Not hard at all  Food Insecurity: No Food Insecurity (03/05/2023)   Hunger Vital Sign    Worried About Running Out of Food in the Last Year: Never true    Ran Out of Food in the Last Year: Never true  Transportation Needs: No Transportation Needs (03/05/2023)   PRAPARE - Administrator, Civil Service (Medical): No    Lack of Transportation (Non-Medical): No  Physical Activity: Insufficiently Active (03/05/2023)   Exercise Vital Sign    Days of Exercise per Week: 2 days    Minutes of Exercise per Session: 30 min  Stress: No Stress Concern Present (03/05/2023)   [employer] of Occupational Health - Occupational Stress Questionnaire    Feeling of Stress : Only a little  Social Connections: Moderately Integrated (03/05/2023)   Social Connection and Isolation Panel [NHANES]    Frequency of Communication with Friends and Family: Once a week    Frequency of Social Gatherings with Friends and Family: Once a week    Attends Religious Services: More than 4 times per year    Active Member of [employer] or Organizations: Yes  Attends Banker Meetings: More than 4 times per year    Marital Status: Married  Catering manager Violence: Not At Risk (03/19/2021)   Humiliation, Afraid, Rape, and Kick questionnaire    Fear of Current or Ex-Partner: No    Emotionally Abused: No    Physically Abused: No    Sexually Abused: No     PHYSICAL EXAM  Vitals:   05/06/23 1552  BP: 122/78  Pulse: 86  Weight: 189 lb (85.7 kg)  Height: 5\' 5"  (1.651 m)    Body mass index is 31.45 kg/m.  VA:   20/20 OS and 20/20-1 OD       General: The patient is well-developed and well-nourished and in no acute distress  HEENT:  Head is [place]/AT.  Sclera are anicteric.    Skin: She has lymphedema in the left arm.   Neurologic Exam  Mental status: The patient is alert and oriented x 3 at the time of the examination. The patient has apparent normal recent and remote memory, with  an apparently normal attention span and concentration ability.   Speech is normal.  Cranial nerves: Extraocular movements are full.   She can move her eyes without pain.  No APD today.  Color vision is now symmetric..  Facial strength and sensation was normal.  Hearing was symmetric.  Motor:  Muscle bulk is normal.   Tone is normal. Strength is  5 / 5 in all 4 extremities.   Sensory: Sensory testing is intact to pinprick, soft touch and vibration sensation in all 4 extremities.  Coordination: Cerebellar testing reveals good finger-nose-finger and heel-to-shin bilaterally.  Gait and station: Station is normal.   Gait is normal Tandem gait is slightly wide.  Romberg negative.  Reflexes: Deep tendon reflexes are symmetric and normal bilaterally.     DIAGNOSTIC DATA (LABS, IMAGING, TESTING) - I reviewed patient records, labs, notes, testing and imaging myself where available.  Lab Results  Component Value Date   WBC 4.7 10/22/2022   HGB 14.7 10/22/2022   HCT 44.1 10/22/2022   MCV 98 (H) 10/22/2022   PLT 174 10/22/2022      Component Value Date/Time   NA 142 10/22/2022 0851   K 4.4 10/22/2022 0851   CL 107 (H) 10/22/2022 0851   CO2 24 10/22/2022 0851   GLUCOSE 90 10/22/2022 0851   GLUCOSE 100 (H) 08/29/2022 0945   BUN 16 10/22/2022 0851   CREATININE 0.92 10/22/2022 0851   CREATININE 0.88 11/08/2021 1016   CALCIUM 8.6 (L) 10/22/2022 0851   PROT 6.1 10/22/2022 0851   ALBUMIN 4.0 10/22/2022 0851   AST 21 10/22/2022 0851   AST 20 11/08/2021 1016   ALT 28 10/22/2022 0851   ALT 26 11/08/2021 1016   ALKPHOS 44 10/22/2022 0851   BILITOT 0.3 10/22/2022 0851   BILITOT 0.4 11/08/2021 1016   GFRNONAA >60 08/29/2022 0945   GFRNONAA >60 11/08/2021 1016   Lab Results  Component Value Date   CHOL 178 10/22/2022   HDL 56 10/22/2022   LDLCALC 105 (H) 10/22/2022   TRIG 95 10/22/2022   CHOLHDL 3.2 10/22/2022   Lab Results  Component Value Date   HGBA1C 5.4 08/19/2022        ASSESSMENT AND PLAN  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    She is doing well with no exacerbations since starting Actemra.  We will check some lab work today.  She is advised to get a lipid panel at least once a year. 2.   Stay active.  Continue Vit D 3.   Follow-up with Dr. Pamelia Hoit for breast cancer.   4.   rtc 6 months    Jumaane Weatherford A. Epimenio Foot, MD, [facility] 05/06/2023, 8:24 PM Certified in Neurology, Clinical Neurophysiology, Sleep Medicine and Neuroimaging  [facility] [address] [place], [place] [address] [phone]

## 2023-05-08 ENCOUNTER — Encounter: Payer: Self-pay | Admitting: Neurology

## 2023-05-08 ENCOUNTER — Ambulatory Visit (HOSPITAL_BASED_OUTPATIENT_CLINIC_OR_DEPARTMENT_OTHER): Payer: Commercial Managed Care - PPO | Admitting: Physical Therapy

## 2023-05-08 ENCOUNTER — Encounter (HOSPITAL_BASED_OUTPATIENT_CLINIC_OR_DEPARTMENT_OTHER): Payer: Self-pay | Admitting: Physical Therapy

## 2023-05-08 DIAGNOSIS — M25551 Pain in right hip: Secondary | ICD-10-CM | POA: Diagnosis not present

## 2023-05-08 DIAGNOSIS — R262 Difficulty in walking, not elsewhere classified: Secondary | ICD-10-CM

## 2023-05-08 DIAGNOSIS — M6281 Muscle weakness (generalized): Secondary | ICD-10-CM

## 2023-05-08 DIAGNOSIS — M25651 Stiffness of right hip, not elsewhere classified: Secondary | ICD-10-CM

## 2023-05-08 LAB — CBC WITH DIFFERENTIAL/PLATELET
Basophils Absolute: 0 10*3/uL (ref 0.0–0.2)
Basos: 1 %
EOS (ABSOLUTE): 0.5 10*3/uL — ABNORMAL HIGH (ref 0.0–0.4)
Eos: 8 %
Hematocrit: 45.1 % (ref 34.0–46.6)
Hemoglobin: 14.9 g/dL (ref 11.1–15.9)
Immature Grans (Abs): 0 10*3/uL (ref 0.0–0.1)
Immature Granulocytes: 0 %
Lymphocytes Absolute: 1.8 10*3/uL (ref 0.7–3.1)
Lymphs: 31 %
MCH: 32.1 pg (ref 26.6–33.0)
MCHC: 33 g/dL (ref 31.5–35.7)
MCV: 97 fL (ref 79–97)
Monocytes Absolute: 0.8 10*3/uL (ref 0.1–0.9)
Monocytes: 15 %
Neutrophils Absolute: 2.6 10*3/uL (ref 1.4–7.0)
Neutrophils: 45 %
Platelets: 213 10*3/uL (ref 150–450)
RBC: 4.64 x10E6/uL (ref 3.77–5.28)
RDW: 11.3 % — ABNORMAL LOW (ref 11.7–15.4)
WBC: 5.7 10*3/uL (ref 3.4–10.8)

## 2023-05-08 LAB — COMPREHENSIVE METABOLIC PANEL
ALT: 56 [IU]/L — ABNORMAL HIGH (ref 0–32)
AST: 30 [IU]/L (ref 0–40)
Albumin: 4.2 g/dL (ref 3.8–4.9)
Alkaline Phosphatase: 47 [IU]/L (ref 44–121)
BUN/Creatinine Ratio: 23 (ref 9–23)
BUN: 24 mg/dL (ref 6–24)
Bilirubin Total: 0.2 mg/dL (ref 0.0–1.2)
CO2: 25 mmol/L (ref 20–29)
Calcium: 9.3 mg/dL (ref 8.7–10.2)
Chloride: 103 mmol/L (ref 96–106)
Creatinine, Ser: 1.06 mg/dL — ABNORMAL HIGH (ref 0.57–1.00)
Globulin, Total: 2.5 g/dL (ref 1.5–4.5)
Glucose: 78 mg/dL (ref 70–99)
Potassium: 3.9 mmol/L (ref 3.5–5.2)
Sodium: 142 mmol/L (ref 134–144)
Total Protein: 6.7 g/dL (ref 6.0–8.5)
eGFR: 63 mL/min/{1.73_m2} (ref >59)

## 2023-05-08 LAB — ANTI-MOG, SERUM: MOG Antibody, Cell-based IFA: POSITIVE — AB

## 2023-05-08 LAB — ANTI-MOG ANTIBODY TITER, SERUM: Anti-MOG Antibody Titer, Serum: 1:100 {titer}

## 2023-05-08 NOTE — Telephone Encounter (Signed)
Dr Epimenio Foot messaged pt about this and pt responded today.

## 2023-05-08 NOTE — Therapy (Signed)
OUTPATIENT PHYSICAL THERAPY TREATMENT   Patient Name: Valerie [name] MRN: [id number] DOB:11-Sep-1970, 53 y.o., female Today's Date: 05/08/2023  END OF SESSION:  PT End of Session - 05/08/23 1313     Visit Number 8    Number of Visits 25    Date for PT Re-Evaluation 06/27/22    Authorization Type UHC  no VL    PT Start Time 1301    PT Stop Time 1340    PT Time Calculation (min) 39 min    Behavior During Therapy WFL for tasks assessed/performed                 Past Medical History:  Diagnosis Date   Abnormal echocardiogram 11/28/2021   EF 60 -65%, abnormal global longitudinal strain, see cardiology OV in Epic dated 12/25/21   Breast cancer (HCC)    Cancer (HCC) 09/2020   left breast, ER+, s/p radiation therapy 12/25/20 - 01/18/21, Patient follows with Dr. Serena Croissant @ [facility].   COVID-19 11/2019   flu-like symptoms, cough, no hospitalizations   Edema    lower extremity, follows w/ [facility], Sabino Dick, [place]. See OV dated 03/24/22 in Epic.   GERD (gastroesophageal reflux disease)    Follows w/ Dr. Judeen Hammans, gastroenterologist and PCP, Dr. Maryruth Hancock [facility] @ [facility].   Hashimoto's disease    Follows with Dr. Ernest Haber @ [place].   History of hiatal hernia    2 cm hiatal hernia per 01/08/22 EGD   History of radiation therapy 2022   completed 01-18-2021   [facility] (hard of hearing)    has gotten bilateral aides.   Hypothyroidism    Follows w/ endocronologist, Dr. Ernest Haber @ [place].   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 Despina Arias @ [facility].   Optic neuritis    Follows w/ opthamology, Dr. Mora Appl.03/2020 right eye (almost complete blindness), 04/2020 left eye   Personal history of radiation therapy    PONV (postoperative nausea and vomiting)     Status post dilation of esophageal narrowing 01/08/2022   EGD, esophagus dilated   Wears glasses    Past Surgical History:  Procedure Laterality Date   ABDOMINAL HYSTERECTOMY  04/23/22   BIOPSY BREAST Left 10/17/2020   BREAST LUMPECTOMY     BREAST LUMPECTOMY WITH RADIOACTIVE SEED AND SENTINEL LYMPH NODE BIOPSY Left 11/15/2020   Procedure: LEFT BREAST LUMPECTOMY WITH RADIOACTIVE SEED AND LEFT SENTINEL LYMPH NODE BIOPSY;  Surgeon: Harriette Bouillon, MD;  Location: [facility];  Service: General;  Laterality: Left;   BREAST SURGERY  August 2022   Cancer surgery   COLONOSCOPY  09/06/2021   cecal polyp   CT CORONARY CA SCORING  07/13/2020   Coronary Calcium = 0 (in Epic)   CYSTOSCOPY N/A 04/23/2022   Procedure: CYSTOSCOPY;  Surgeon: Jerene Bears, MD;  Location: [facility];  Service: Gynecology;  Laterality: N/A;   ESOPHAGOGASTRODUODENOSCOPY  01/08/2022   2cm hiatal hernia, esophagus dilated   TOTAL LAPAROSCOPIC HYSTERECTOMY WITH SALPINGECTOMY Bilateral 04/23/2022   Procedure: TOTAL LAPAROSCOPIC HYSTERECTOMY BILATERAL SALPINGECTOMY, LEFT OOPHORECTOMY;  Surgeon: Jerene Bears, MD;  Location: [facility] [place];  Service: Gynecology;  Laterality: Bilateral;   TRANSTHORACIC ECHOCARDIOGRAM  11/28/2021   see results in Epic   TRANSTHORACIC ECHOCARDIOGRAM  07/05/2020   in Epic   TUBAL LIGATION  02/2012   WISDOM TOOTH EXTRACTION     early 1990's   Patient Active Problem List   Diagnosis Date Noted   High risk medication use 05/06/2023   Genetic testing 07/04/2022   Family history of uterine cancer 06/24/2022   Local edema 02/25/2022   Hypothyroidism due to Hashimoto's thyroiditis 06/07/2021   Malignant neoplasm of lower-inner quadrant of left breast in female, estrogen receptor positive (HCC) 10/23/2020   Optic neuritis, left 10/18/2020   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) 07/10/2020   Mitral regurgitation     Neuromyelitis optica (devic) (HCC) 05/07/2020   Hypothyroid 04/25/2020   Optic neuritis, right 04/24/2020     REFERRING PROVIDER:  Huel Cote, MD    REFERRING DIAG:  [phone] (ICD-10-CM) - Tear of right acetabular labrum, initial encounter   s/p Rt acetabular labral repair  Rationale for Evaluation and Treatment: Rehabilitation  THERAPY DIAG:  Pain in right hip  Difficulty in walking, not elsewhere classified  Stiffness of right hip, not elsewhere classified  Muscle weakness (generalized)  ONSET DATE: DOS 03/31/23   SUBJECTIVE:                                                                                                                                                                                           SUBJECTIVE STATEMENT: Pt reports she has one spot in front of R hip that remains a constant (ache).  She rode bicycle in gym for 20 min this morning.  She reports she would like more core exercises.    Eval:  Feeling pretty good. Hip pain for 15 years- best I can think was after a hike with my daughter. Was actually seeing MD for knee when we looked at the hip. 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   PERTINENT HISTORY:  N/a  PAIN:  Are you having pain? Yes  NPRS: 1-2/10 Location: ant R hip   PRECAUTIONS:  None  RED FLAGS: None   WEIGHT BEARING RESTRICTIONS:  Yes 2 wk TDWB  FALLS:  Has patient fallen in last 6 months? No  LIVING ENVIRONMENT: Lives with: lives with their spouse and 2 cats Lives in: House ranch with basement (washer/dryer & garage where she parks) Stairs: Yes: Internal: 12-14 steps; on left going up and External: 4 steps; on right going up Has following equipment at home: None  OCCUPATION:  works for [employer] including lifting up 25-30#, pushing & pulling   PLOF:  Independent  PATIENT GOALS:  Back to gym, improve flexibility   OBJECTIVE:  Note: Objective measures were  completed at Evaluation unless  otherwise noted.  PATIENT SURVEYS:  FOTO 25   GAIT: Eval: arrived with bil axillary crutches with brace, using crutches more for stability 1/13: using SPC when walking a lot                                                                                                                             TREATMENT DATE:  05/08/23 -Walking backward 2 laps, - side stepping 1 lap, with arm addct/abdct x 2 laps - UE On wall:  backward lunges x 10 (RLE back); hip circles CW/CCW x 10 each LE; RLE heel raises x 10 - TrA set with 1/2 hollow noodle pull down x 5-> rainbow hand floats (bilat) pull down to thighs with slow return to surface x 10 - seated on stairs, cycling -> stopped - tricep press ups from 4th step x 10 - Plank with hands on bench in water with hip ext x 10 each LE - Plank to/from superman with UE on yellow hand floats x 10 - straddling noodle and UE on yellow hand floats near corner cycling with LE directly underneath her  05/04/2023 Tall kneeling glut set- lacking 12 deg hip extension; standing -6 deg hip extension Tall kneel + Rt GHJ flexion Tall kneeling hinge back Tall kneeling bridge Standing lunge + Rt shoulder flexion green tband resist, added heel raise with knee extended STM hip flexors  1/15  Recumbent bike Lvl 1 5 min  Standing hip flexor stretch 30s 2x  Tall kneeling shoulder ext on cable 3x10 Cable staggered stance chest press cable Sidestepping no resistance   Review of gym activity- able to do knee ext, HS curl, and calf machine, no ABD yet but able to do small range ADD machine  R hip inf glide (scoop mob) grade III   04/27/23 STG review Cable machine- tall kneel double & single arm row, straight arm press down, hinge with single arm row Tall mini lunge with single arm cable row & chest press Lateral walking with cable resistance.  STM hip flexors   PATIENT EDUCATION:  Education details: Anatomy of condition, POC, HEP, exercise form/rationale Person  educated: Patient Education method: Explanation, Demonstration, Tactile cues, Verbal cues, and Handouts Education comprehension: verbalized understanding, returned demonstration, verbal cues required, tactile cues required, and needs further education  HOME EXERCISE PROGRAM:  [email or web address]  Access Code: [email or web address]   Aquatic Access Code: [email or web address] URL: [email or web address] Date: 05/08/2023  (*laminated copy not issued yet, only the code)  Prepared by: [facility] This aquatic home exercise program from MedBridge utilizes pictures from land based exercises, but has been adapted prior to lamination and issuance.    ASSESSMENT:  CLINICAL IMPRESSION:  Pt is s/p R hip labral repair.  Pt tolerated aquatic exercises well, without increase in symptoms.  Reviewed precautions and trialed various core strengthening exercises. Aquatic HEP re-created with exercises completed from this session.  Will continue to progress  per protocol.    *04/23/23: Pt advised to avoid leg pressing or squatting type motions at this time.      REHAB POTENTIAL: Good  CLINICAL DECISION MAKING: Stable/uncomplicated  EVALUATION COMPLEXITY: Low   GOALS: Goals reviewed with patient? Yes  SHORT TERM GOALS: Target date: 4 weeks 04/28/23  Pain free flexion to 90 Baseline: Goal status: achieved   2.  Demo controlled quad set with hold Baseline:  Goal status: Achieved   3.  30s sit to stand without pain or compensation Baseline:  Goal status: achieved   4.  SLS 30s without compensation Baseline:  Goal status: achieved  LONG TERM GOALS:  Able to demo step up on 6" step with level pelvis and proper form Baseline:  Goal status: INITIAL Week 8 05/26/23  2.  Will tolerate at least 3 min on elliptical, demonstrating good tolerance to repetitive weight bearing motion Baseline:  Goal status: INITIAL  Week 8 05/26/23  3.  Demonstrate proper form in at least  10 continuous lunges without increased pain Baseline:  Goal status: INITIAL  Week 12 06/23/23  4.  Demonstrate gentle, double and single foot plyometric motions with good proximal form Baseline:  Goal status: INITIAL Week 12 06/23/23      PLAN:  PT FREQUENCY: 1-2x/week  PT DURATION: 12 weeks  PLANNED INTERVENTIONS: 97164- PT Re-evaluation, 97110-Therapeutic exercises, 97530- Therapeutic activity, 97112- Neuromuscular re-education, 97535- Self Care, 16109- Manual therapy, [phone]- Gait training, [phone]- Aquatic Therapy, Patient/Family education, Balance training, Stair training, Taping, Dry Needling, Joint mobilization, Spinal mobilization, Scar mobilization, Cryotherapy, and Moist heat.  PLAN FOR NEXT SESSION: per protocol     Mayer Camel, PTA 05/08/23 5:12 PM [facility] [address] [place], [place], [address] Phone: [phone]   Fax:  [phone]

## 2023-05-11 ENCOUNTER — Encounter (HOSPITAL_BASED_OUTPATIENT_CLINIC_OR_DEPARTMENT_OTHER): Payer: Self-pay | Admitting: Physical Therapy

## 2023-05-11 ENCOUNTER — Ambulatory Visit (HOSPITAL_BASED_OUTPATIENT_CLINIC_OR_DEPARTMENT_OTHER): Payer: Commercial Managed Care - PPO | Admitting: Physical Therapy

## 2023-05-11 DIAGNOSIS — M6281 Muscle weakness (generalized): Secondary | ICD-10-CM

## 2023-05-11 DIAGNOSIS — R262 Difficulty in walking, not elsewhere classified: Secondary | ICD-10-CM

## 2023-05-11 DIAGNOSIS — M25651 Stiffness of right hip, not elsewhere classified: Secondary | ICD-10-CM

## 2023-05-11 DIAGNOSIS — M25551 Pain in right hip: Secondary | ICD-10-CM | POA: Diagnosis not present

## 2023-05-11 NOTE — Therapy (Signed)
OUTPATIENT PHYSICAL THERAPY TREATMENT   Patient Name: Valerie [name] MRN: [id number] DOB:[birth year], 53 y.o., female Today's Date: 05/11/2023  END OF SESSION:  PT End of Session - 05/11/23 1133     Visit Number 9    Number of Visits 25    Date for PT Re-Evaluation 06/27/22    Authorization Type UHC  no VL    PT Start Time 1140    PT Stop Time 1225    PT Time Calculation (min) 45 min    Activity Tolerance Patient tolerated treatment well    Behavior During Therapy WFL for tasks assessed/performed                 Past Medical History:  Diagnosis Date   Abnormal echocardiogram 11/28/2021   EF 60 -65%, abnormal global longitudinal strain, see cardiology OV in Epic dated 12/25/21   Breast cancer (HCC)    Cancer (HCC) 09/2020   left breast, ER+, s/p radiation therapy 12/25/20 - 01/18/21, Patient follows with Dr. Serena Croissant @ [facility].   COVID-19 11/2019   flu-like symptoms, cough, no hospitalizations   Edema    lower extremity, follows w/ [facility], Sabino Dick, [place]. See OV dated 03/24/22 in Epic.   GERD (gastroesophageal reflux disease)    Follows w/ Dr. Judeen Hammans, gastroenterologist and PCP, Dr. Maryruth Hancock [facility] @ [facility].   Hashimoto's disease    Follows with Dr. Ernest Haber @ [place].   History of hiatal hernia    2 cm hiatal hernia per 01/08/22 EGD   History of radiation therapy 2022   completed 01-18-2021   [facility] (hard of hearing)    has gotten bilateral aides.   Hypothyroidism    Follows w/ endocronologist, Dr. Ernest Haber @ .   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 Despina Arias @ [facility].   Optic neuritis    Follows w/ opthamology, Dr. Mora Appl.03/2020 right eye (almost complete blindness), 04/2020 left eye   Personal history of  radiation therapy    PONV (postoperative nausea and vomiting)    Status post dilation of esophageal narrowing 01/08/2022   EGD, esophagus dilated   Wears glasses    Past Surgical History:  Procedure Laterality Date   ABDOMINAL HYSTERECTOMY  04/23/22   BIOPSY BREAST Left 10/17/2020   BREAST LUMPECTOMY     BREAST LUMPECTOMY WITH RADIOACTIVE SEED AND SENTINEL LYMPH NODE BIOPSY Left 11/15/2020   Procedure: LEFT BREAST LUMPECTOMY WITH RADIOACTIVE SEED AND LEFT SENTINEL LYMPH NODE BIOPSY;  Surgeon: Harriette Bouillon, MD;  Location: [facility];  Service: General;  Laterality: Left;   BREAST SURGERY  August 2022   Cancer surgery   COLONOSCOPY  09/06/2021   cecal polyp   CT CORONARY CA SCORING  07/13/2020   Coronary Calcium = 0 (in Epic)   CYSTOSCOPY N/A 04/23/2022   Procedure: CYSTOSCOPY;  Surgeon: Jerene Bears, MD;  Location: [facility];  Service: Gynecology;  Laterality: N/A;   ESOPHAGOGASTRODUODENOSCOPY  01/08/2022   2cm hiatal hernia, esophagus dilated   TOTAL LAPAROSCOPIC HYSTERECTOMY WITH SALPINGECTOMY Bilateral 04/23/2022   Procedure: TOTAL LAPAROSCOPIC HYSTERECTOMY BILATERAL SALPINGECTOMY, LEFT OOPHORECTOMY;  Surgeon: Jerene Bears, MD;  Location: [facility];  Service: Gynecology;  Laterality: Bilateral;   TRANSTHORACIC ECHOCARDIOGRAM  11/28/2021  see results in Epic   TRANSTHORACIC ECHOCARDIOGRAM  07/05/2020   in Epic   TUBAL LIGATION  02/2012   WISDOM TOOTH EXTRACTION     early 1990's   Patient Active Problem List   Diagnosis Date Noted   High risk medication use 05/06/2023   Genetic testing 07/04/2022   Family history of uterine cancer 06/24/2022   Local edema 02/25/2022   Hypothyroidism due to Hashimoto's thyroiditis 06/07/2021   Malignant neoplasm of lower-inner quadrant of left breast in female, estrogen receptor positive (HCC) 10/23/2020   Optic neuritis, left 10/18/2020   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) 07/10/2020   Mitral regurgitation    Neuromyelitis optica (devic) (HCC) 05/07/2020   Hypothyroid 04/25/2020   Optic neuritis, right 04/24/2020     REFERRING PROVIDER:  Huel Cote, MD    REFERRING DIAG:  [phone] (ICD-10-CM) - Tear of right acetabular labrum, initial encounter   s/p Rt acetabular labral repair  Rationale for Evaluation and Treatment: Rehabilitation  THERAPY DIAG:  Pain in right hip  Difficulty in walking, not elsewhere classified  Stiffness of right hip, not elsewhere classified  Muscle weakness (generalized)  ONSET DATE: DOS 03/31/23   SUBJECTIVE:                                                                                                                                                                                           SUBJECTIVE STATEMENT:  Pt notes that walking more and notices more glute pain. Pt states hip extension can sometimes cause posterior joint pain.  Eval:  Feeling pretty good. Hip pain for 15 years- best I can think was after a hike with my daughter. Was actually seeing MD for knee when we looked at the hip. 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   PERTINENT HISTORY:  N/a  PAIN:  Are you having pain? Yes  NPRS: 1-2/10 Location: ant R hip   PRECAUTIONS:  None  RED FLAGS: None   WEIGHT BEARING RESTRICTIONS:  Yes 2 wk TDWB  FALLS:  Has patient fallen in last 6 months? No  LIVING ENVIRONMENT: Lives with: lives with their spouse and 2 cats Lives in: House [place] with basement (washer/dryer & garage where she parks) Stairs: Yes: Internal: 12-14 steps; on left going up and External: 4 steps; on right going up Has following equipment at home: None  OCCUPATION:  works for [employer] including lifting up 25-30#, pushing & pulling   PLOF:  Independent  PATIENT GOALS:  Back to gym, improve flexibility   OBJECTIVE:  Note: Objective measures were completed at Evaluation  unless otherwise noted.  PATIENT SURVEYS:  FOTO 25   GAIT: Eval: arrived with bil axillary crutches with brace, using crutches more for stability 1/13: using SPC when walking a lot                                                                                                                          TREATMENT DATE:   1/27  LAD in open pack position   [place] hip mob inf and lateral grade III; ER and flexion bias  R hip inf glide (scoop mob) grade III  Heel toe rocking 20x Stool IR/ER 20x FABER slide (unable without manual) Seated figure 4 30s 3x Sidestepping RTB at knees; fwd and retro monster walk 2x each 30-68ft 4" step up and down 3x8  05/08/23 -Walking backward 2 laps, - side stepping 1 lap, with arm addct/abdct x 2 laps - UE On wall:  backward lunges x 10 (RLE back); hip circles CW/CCW x 10 each LE; RLE heel raises x 10 - TrA set with 1/2 hollow noodle pull down x 5-> rainbow hand floats (bilat) pull down to thighs with slow return to surface x 10 - seated on stairs, cycling -> stopped - tricep press ups from 4th step x 10 - Plank with hands on bench in water with hip ext x 10 each LE - Plank to/from superman with UE on yellow hand floats x 10 - straddling noodle and UE on yellow hand floats near corner cycling with LE directly underneath her  05/04/2023 Tall kneeling glut set- lacking 12 deg hip extension; standing -6 deg hip extension Tall kneel + Rt GHJ flexion Tall kneeling hinge back Tall kneeling bridge Standing lunge + Rt shoulder flexion green tband resist, added heel raise with knee extended STM hip flexors  1/15  Recumbent bike Lvl 1 5 min  Standing hip flexor stretch 30s 2x  Tall kneeling shoulder ext on cable 3x10 Cable staggered stance chest press cable Sidestepping no resistance   Review of gym activity- able to do knee ext, HS curl, and calf machine, no ABD yet but able to do small range ADD machine  R hip inf glide (scoop mob) grade  III   04/27/23 STG review Cable machine- tall kneel double & single arm row, straight arm press down, hinge with single arm row Tall mini lunge with single arm cable row & chest press Lateral walking with cable resistance.  STM hip flexors   PATIENT EDUCATION:  Education details: Anatomy of condition, POC, HEP, exercise form/rationale Person educated: Patient Education method: Explanation, Demonstration, Tactile cues, Verbal cues, and Handouts Education comprehension: verbalized understanding, returned demonstration, verbal cues required, tactile cues required, and needs further education  HOME EXERCISE PROGRAM:  [email or web address]  Access Code: [email or web address]   Aquatic Access Code: [email or web address] URL: [email or web address] Date: 05/08/2023  (*laminated copy not issued yet, only the code)  Prepared by: [facility] This aquatic home exercise program from [employer] utilizes pictures from land based exercises,  but has been adapted prior to lamination and issuance.    ASSESSMENT:  CLINICAL IMPRESSION:  Pt is nearly 6 wks s/p R hip labral repair.  Pt with notable rotational ROM deficits that create pain at the lateral and posterior hip. Pt ROM and comfort for ER/IR improves with joint mobilizations and progressive stretching. HEP updated and progressed to include standing/WB hip strengthening exercise as well as mobility to improve lack of hip ext. Continue with ROM focus, SL stability, and R hip strength as tolerated. Pt would benefit from continued skilled therapy in order to reach goals and maximize functional R LE  strength and ROM for return to occupation, ADL, and normalized community mobility.    REHAB POTENTIAL: Good  CLINICAL DECISION MAKING: Stable/uncomplicated  EVALUATION COMPLEXITY: Low   GOALS: Goals reviewed with patient? Yes  SHORT TERM GOALS: Target date: 4 weeks 04/28/23  Pain free flexion to 90 Baseline: Goal status:  achieved   2.  Demo controlled quad set with hold Baseline:  Goal status: Achieved   3.  30s sit to stand without pain or compensation Baseline:  Goal status: achieved   4.  SLS 30s without compensation Baseline:  Goal status: achieved  LONG TERM GOALS:  Able to demo step up on 6" step with level pelvis and proper form Baseline:  Goal status: INITIAL Week 8 05/26/23  2.  Will tolerate at least 3 min on elliptical, demonstrating good tolerance to repetitive weight bearing motion Baseline:  Goal status: INITIAL  Week 8 05/26/23  3.  Demonstrate proper form in at least 10 continuous lunges without increased pain Baseline:  Goal status: INITIAL  Week 12 06/23/23  4.  Demonstrate gentle, double and single foot plyometric motions with good proximal form Baseline:  Goal status: INITIAL Week 12 06/23/23      PLAN:  PT FREQUENCY: 1-2x/week  PT DURATION: 12 weeks  PLANNED INTERVENTIONS: 97164- PT Re-evaluation, 97110-Therapeutic exercises, 97530- Therapeutic activity, 97112- Neuromuscular re-education, 97535- Self Care, 29562- Manual therapy, [phone]- Gait training, [phone]- Aquatic Therapy, Patient/Family education, Balance training, Stair training, Taping, Dry Needling, Joint mobilization, Spinal mobilization, Scar mobilization, Cryotherapy, and Moist heat.  PLAN FOR NEXT SESSION: per protocol   Zebedee Iba PT, DPT 05/11/23 12:54 PM

## 2023-05-13 ENCOUNTER — Encounter (HOSPITAL_BASED_OUTPATIENT_CLINIC_OR_DEPARTMENT_OTHER): Payer: Self-pay | Admitting: Physical Therapy

## 2023-05-13 ENCOUNTER — Ambulatory Visit (HOSPITAL_BASED_OUTPATIENT_CLINIC_OR_DEPARTMENT_OTHER): Payer: Commercial Managed Care - PPO | Admitting: Physical Therapy

## 2023-05-13 DIAGNOSIS — R262 Difficulty in walking, not elsewhere classified: Secondary | ICD-10-CM

## 2023-05-13 DIAGNOSIS — M25551 Pain in right hip: Secondary | ICD-10-CM | POA: Diagnosis not present

## 2023-05-13 DIAGNOSIS — M25651 Stiffness of right hip, not elsewhere classified: Secondary | ICD-10-CM

## 2023-05-13 DIAGNOSIS — M6281 Muscle weakness (generalized): Secondary | ICD-10-CM

## 2023-05-13 NOTE — Therapy (Signed)
OUTPATIENT PHYSICAL THERAPY TREATMENT   Patient Name: Valerie [name] MRN: [id number] DOB:July 16, 1970, 53 y.o., female Today's Date: 05/13/2023  END OF SESSION:  PT End of Session - 05/13/23 1231     Visit Number 10    Number of Visits 25    Date for PT Re-Evaluation 06/27/22    Authorization Type UHC  no VL    PT Start Time 1200    PT Stop Time 1239    PT Time Calculation (min) 39 min    Activity Tolerance Patient tolerated treatment well    Behavior During Therapy WFL for tasks assessed/performed                  Past Medical History:  Diagnosis Date   Abnormal echocardiogram 11/28/2021   EF 60 -65%, abnormal global longitudinal strain, see cardiology OV in Epic dated 12/25/21   Breast cancer (HCC)    Cancer (HCC) 09/2020   left breast, ER+, s/p radiation therapy 12/25/20 - 01/18/21, Patient follows with Dr. Serena Croissant @ [facility].   COVID-19 11/2019   flu-like symptoms, cough, no hospitalizations   Edema    lower extremity, follows w/ [facility], Sabino Dick, [place]. See OV dated 03/24/22 in Epic.   GERD (gastroesophageal reflux disease)    Follows w/ Dr. Judeen Hammans, gastroenterologist and PCP, Dr. Maryruth Hancock [facility] @ [facility].   Hashimoto's disease    Follows with Dr. Ernest Haber @ [place].   History of hiatal hernia    2 cm hiatal hernia per 01/08/22 EGD   History of radiation therapy 2022   completed 01-18-2021   [facility] (hard of hearing)    has gotten bilateral aides.   Hypothyroidism    Follows w/ endocronologist, Dr. Ernest Haber @ [place].   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 Despina Arias @ [facility].   Optic neuritis    Follows w/ opthamology, Dr. Mora Appl.03/2020 right eye (almost complete blindness), 04/2020 left eye   Personal history of  radiation therapy    PONV (postoperative nausea and vomiting)    Status post dilation of esophageal narrowing 01/08/2022   EGD, esophagus dilated   Wears glasses    Past Surgical History:  Procedure Laterality Date   ABDOMINAL HYSTERECTOMY  04/23/22   BIOPSY BREAST Left 10/17/2020   BREAST LUMPECTOMY     BREAST LUMPECTOMY WITH RADIOACTIVE SEED AND SENTINEL LYMPH NODE BIOPSY Left 11/15/2020   Procedure: LEFT BREAST LUMPECTOMY WITH RADIOACTIVE SEED AND LEFT SENTINEL LYMPH NODE BIOPSY;  Surgeon: Harriette Bouillon, MD;  Location: [facility];  Service: General;  Laterality: Left;   BREAST SURGERY  August 2022   Cancer surgery   COLONOSCOPY  09/06/2021   cecal polyp   CT CORONARY CA SCORING  07/13/2020   Coronary Calcium = 0 (in Epic)   CYSTOSCOPY N/A 04/23/2022   Procedure: CYSTOSCOPY;  Surgeon: Jerene Bears, MD;  Location: [facility];  Service: Gynecology;  Laterality: N/A;   ESOPHAGOGASTRODUODENOSCOPY  01/08/2022   2cm hiatal hernia, esophagus dilated   TOTAL LAPAROSCOPIC HYSTERECTOMY WITH SALPINGECTOMY Bilateral 04/23/2022   Procedure: TOTAL LAPAROSCOPIC HYSTERECTOMY BILATERAL SALPINGECTOMY, LEFT OOPHORECTOMY;  Surgeon: Jerene Bears, MD;  Location: [facility] [place];  Service: Gynecology;  Laterality: Bilateral;   TRANSTHORACIC ECHOCARDIOGRAM  11/28/2021  see results in Epic   TRANSTHORACIC ECHOCARDIOGRAM  07/05/2020   in Epic   TUBAL LIGATION  02/2012   WISDOM TOOTH EXTRACTION     early 1990's   Patient Active Problem List   Diagnosis Date Noted   High risk medication use 05/06/2023   Genetic testing 07/04/2022   Family history of uterine cancer 06/24/2022   Local edema 02/25/2022   Hypothyroidism due to Hashimoto's thyroiditis 06/07/2021   Malignant neoplasm of lower-inner quadrant of left breast in female, estrogen receptor positive (HCC) 10/23/2020   Optic neuritis, left 10/18/2020   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) 07/10/2020   Mitral regurgitation    Neuromyelitis optica (devic) (HCC) 05/07/2020   Hypothyroid 04/25/2020   Optic neuritis, right 04/24/2020     REFERRING PROVIDER:  Huel Cote, MD    REFERRING DIAG:  [phone] (ICD-10-CM) - Tear of right acetabular labrum, initial encounter   s/p Rt acetabular labral repair  Rationale for Evaluation and Treatment: Rehabilitation  THERAPY DIAG:  Pain in right hip  Difficulty in walking, not elsewhere classified  Stiffness of right hip, not elsewhere classified  Muscle weakness (generalized)  ONSET DATE: DOS 03/31/23   SUBJECTIVE:                                                                                                                                                                                           SUBJECTIVE STATEMENT:  Pt reports she had pain in Rt posterior glute around 2pm yesterday (3/10); "I did stool stretches and weight shifts, and it seemed to be better after that" .    Eval: Feeling pretty good. Hip pain for 15 years- best I can think was after a hike with my daughter. Was actually seeing MD for knee when we looked at the hip. 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   PERTINENT HISTORY:  N/a  PAIN:  Are you having pain? no  NPRS: 0/10 Location:  Aggravated by: Relieved by:   PRECAUTIONS:  None  RED FLAGS: None   WEIGHT BEARING RESTRICTIONS:  Yes 2 wk TDWB  FALLS:  Has patient fallen in last 6 months? No  LIVING ENVIRONMENT: Lives with: lives with their spouse and 2 cats Lives in: House ranch with basement (washer/dryer & garage where she parks) Stairs: Yes: Internal: 12-14 steps; on left going up and External: 4 steps; on right going up Has following equipment at home: None  OCCUPATION:  works for [employer] including lifting up 25-30#, pushing & pulling   PLOF:  Independent  PATIENT GOALS:  Back to gym, improve flexibility   OBJECTIVE:  Note: Objective measures were completed at Evaluation unless otherwise noted.  PATIENT SURVEYS:  FOTO 25   GAIT: Eval: arrived with bil axillary crutches with brace, using crutches more for stability 1/13: using SPC when walking a lot                                                                                                                          TREATMENT DATE:  05/13/23 Pt seen for aquatic therapy today.  Treatment took place in water 3.5-4.75 ft in depth at the [facility] pool. Temp of water was 91.  Pt entered/exited the pool via stairs independently with bilat rail.  -Walking forward/ backward 4 laps with reciprocal arm swing and cues for vertical trunk - side stepping with arm addct/abdct with rainbow hand floats -> yellow hand floats  - farmer carry with single yellow hand float under water, walking forward / backward  - UE on hand floats:  hip circles CW/CCW x 10 each;  heel/ toe raises x 10;  - UE On wall:  single leg clam x 10 each; backward lunges x 10 (alternating LEs)  - staggered stance kick board row (11,12, 1 o'clock)  - Plank to/from superman with UE on yellow hand floats x 10 - Plank with hands on bench in water with hip ext x 10 each LE - side plank into star fish x 5 each side  - partial squats pushing yellow hand float under water x 10 -  straddling noodle and UE on yellow hand floats near corner cycling with LE directly underneath her  1/27  LAD in open pack position   [place]ulligan hip mob inf and lateral grade III; ER and flexion bias  R hip inf glide (scoop mob) grade III  Heel toe rocking 20x Stool IR/ER 20x FABER slide (unable without manual) Seated figure 4 30s 3x Sidestepping RTB at knees; fwd and retro monster walk 2x each 30-83ft 4" step up and down 3x8  05/08/23 -Aquatic therapy -Walking backward 2 laps, - side stepping 1 lap, with arm addct/abdct x 2 laps - UE On wall:  backward lunges x 10 (RLE back); hip circles CW/CCW x 10  each LE; RLE heel raises x 10 - TrA set with 1/2 hollow noodle pull down x 5-> rainbow hand floats (bilat) pull down to thighs with slow return to surface x 10 - seated on stairs, cycling -> stopped - tricep press ups from 4th step x 10 - Plank with hands on bench in water with hip ext x 10 each LE - Plank to/from superman with UE on yellow hand floats x 10 - straddling noodle and UE on yellow hand floats near corner cycling with LE directly underneath her  05/04/2023 Tall kneeling glut set- lacking 12 deg hip extension; standing -6 deg hip extension Tall kneel + Rt GHJ flexion Tall kneeling hinge back Tall kneeling bridge Standing lunge + Rt shoulder flexion green tband resist, added heel raise with knee extended  STM hip flexors  1/15  Recumbent bike Lvl 1 5 min  Standing hip flexor stretch 30s 2x  Tall kneeling shoulder ext on cable 3x10 Cable staggered stance chest press cable Sidestepping no resistance   Review of gym activity- able to do knee ext, HS curl, and calf machine, no ABD yet but able to do small range ADD machine  R hip inf glide (scoop mob) grade III   04/27/23 STG review Cable machine- tall kneel double & single arm row, straight arm press down, hinge with single arm row Tall mini lunge with single arm cable row & chest press Lateral walking with cable resistance.  STM hip flexors   PATIENT EDUCATION:  Education details: Anatomy of condition, POC, HEP, exercise form/rationale Person educated: Patient Education method: Explanation, Demonstration, Tactile cues, Verbal cues, and Handouts Education comprehension: verbalized understanding, returned demonstration, verbal cues required, tactile cues required, and needs further education  HOME EXERCISE PROGRAM:  [email or web address]  Access Code: [email or web address]   Aquatic Access Code: [email or web address] URL: [email or web address] Date: 05/13/23 Prepared by: [facility] [address] This  aquatic home exercise program from Med[employer] utilizes pictures from land based exercises, but has been adapted prior to lamination and issuance.    ASSESSMENT:  CLINICAL IMPRESSION:  Pt had some delayed onset muscle soreness following last land session; resolved by end of day yesterday. She tolerated all exercises in water well, without production of symptoms.  Updated laminated HEP issued today and verbally reviewed.  Will continue with ROM focus, SL stability, and R hip strength as tolerated. Pt would benefit from continued skilled therapy in order to reach goals and maximize functional R LE  strength and ROM for return to occupation, ADL, and normalized community mobility.    REHAB POTENTIAL: Good  CLINICAL DECISION MAKING: Stable/uncomplicated  EVALUATION COMPLEXITY: Low   GOALS: Goals reviewed with patient? Yes  SHORT TERM GOALS: Target date: 4 weeks 04/28/23  Pain free flexion to 90 Baseline: Goal status: achieved   2.  Demo controlled quad set with hold Baseline:  Goal status: Achieved   3.  30s sit to stand without pain or compensation Baseline:  Goal status: achieved   4.  SLS 30s without compensation Baseline:  Goal status: achieved  LONG TERM GOALS:  Able to demo step up on 6" step with level pelvis and proper form Baseline:  Goal status: INITIAL Week 8 05/26/23  2.  Will tolerate at least 3 min on elliptical, demonstrating good tolerance to repetitive weight bearing motion Baseline:  Goal status: INITIAL  Week 8 05/26/23  3.  Demonstrate proper form in at least 10 continuous lunges without increased pain Baseline:  Goal status: INITIAL  Week 12 06/23/23  4.  Demonstrate gentle, double and single foot plyometric motions with good proximal form Baseline:  Goal status: INITIAL Week 12 06/23/23      PLAN:  PT FREQUENCY: 1-2x/week  PT DURATION: 12 weeks  PLANNED INTERVENTIONS: 97164- PT Re-evaluation, 97110-Therapeutic exercises, 97530- Therapeutic  activity, 97112- Neuromuscular re-education, 97535- Self Care, 29562- Manual therapy, [phone]- Gait training, [phone]- Aquatic Therapy, Patient/Family education, Balance training, Stair training, Taping, Dry Needling, Joint mobilization, Spinal mobilization, Scar mobilization, Cryotherapy, and Moist heat.  PLAN FOR NEXT SESSION: per protocol   Mayer Camel, PTA 05/13/23 12:59 PM [facility] [address] [place], [place], [address] Phone: [phone]   Fax:  [phone]

## 2023-05-14 ENCOUNTER — Inpatient Hospital Stay: Payer: Commercial Managed Care - PPO | Attending: Hematology and Oncology | Admitting: Hematology and Oncology

## 2023-05-14 ENCOUNTER — Inpatient Hospital Stay: Payer: Commercial Managed Care - PPO

## 2023-05-14 VITALS — BP 120/62 | HR 92 | Temp 97.2°F | Resp 18 | Ht 65.0 in | Wt 190.9 lb

## 2023-05-14 DIAGNOSIS — C50312 Malignant neoplasm of lower-inner quadrant of left female breast: Secondary | ICD-10-CM | POA: Diagnosis not present

## 2023-05-14 DIAGNOSIS — Z923 Personal history of irradiation: Secondary | ICD-10-CM | POA: Diagnosis not present

## 2023-05-14 DIAGNOSIS — Z170 Estrogen receptor positive status [ER+]: Secondary | ICD-10-CM | POA: Insufficient documentation

## 2023-05-14 DIAGNOSIS — Z1721 Progesterone receptor positive status: Secondary | ICD-10-CM | POA: Diagnosis not present

## 2023-05-14 DIAGNOSIS — Z79810 Long term (current) use of selective estrogen receptor modulators (SERMs): Secondary | ICD-10-CM | POA: Diagnosis not present

## 2023-05-14 DIAGNOSIS — Z1732 Human epidermal growth factor receptor 2 negative status: Secondary | ICD-10-CM | POA: Diagnosis not present

## 2023-05-14 NOTE — Assessment & Plan Note (Signed)
10/23/2020 screening mammogram on 09/11/20 showed possible distortion with calcifications in the left breast. Diagnostic mammogram and [place] on 10/03/20 showed 0.6 cm group of indeterminate lower inner left breast calcifications with possible associated subtle distortion and no abnormal appearing left axillary lymph nodes. Biopsy on 10/17/20 showed invasive ductal carcinoma, DCIS with calcifications, ER+(90%)/PR(95%)/Ki67(<5%).   11/15/2020:Left lumpectomy: No residual cancer, fibrocystic change, margins negative, 0/3 lymph nodes negative (based on the biopsy tumor size: 2 mm), previous ER 90%, PR 95%, HER2 negative, Ki-67 less than 5%   Adjuvant radiation: 12/25/2020-01/18/2021   Treatment plan: Adjuvant antiestrogen therapy with tamoxifen 10 mg daily x5 yrs started November 2022 Tamoxifen toxicities: Elbow pain: Not responding to stopping Tam, oral steroids havent helped her.Advised her to use CBD oil and Voltaren gel Hot flushes    Breast cancer surveillance: mammogram 09/15/2022: benign Density cat B  Breast Exam: 05/14/23: benign, redness in the right breast nipple areolar complex    Recent hysterectomy and unilateral oophorectomy. 06/24/2022: Estradiol: 1465 , FSH: 6.4 Patient is not in menopause.   She did genetic testing and is awaiting the results.   Return to clinic in 1 year for follow-up

## 2023-05-14 NOTE — Progress Notes (Signed)
Patient Care Team: [name], MD as PCP - General (Family Medicine) Jodelle Red, MD as PCP - Cardiology (Cardiology) Harriette Bouillon, MD as Consulting Physician (General Surgery) Serena Croissant, MD as Consulting Physician (Hematology and Oncology) Dorothy Puffer, MD as Consulting Physician (Radiation Oncology) Epimenio Foot, Pearletha Furl, MD (Neurology)  DIAGNOSIS:  Encounter Diagnosis  Name Primary?   Malignant neoplasm of lower-inner quadrant of left breast in female, estrogen receptor positive (HCC) Yes    SUMMARY OF ONCOLOGIC HISTORY: Oncology History  Malignant neoplasm of lower-inner quadrant of left breast in female, estrogen receptor positive (HCC)  10/23/2020 Initial Diagnosis   Screening mammogram on 09/11/20 showed possible distortion with calcifications in the left breast. Diagnostic mammogram and [place] on 10/03/20 showed 0.6 cm group of indeterminate lower inner left breast calcifications with possible associated subtle distortion and no abnormal appearing left axillary lymph nodes. Biopsy on 10/17/20 showed invasive ductal carcinoma, DCIS with calcifications, ER+(90%)/PR(95%)/Ki67(<5%).   10/24/2020 Cancer Staging   Staging form: Breast, AJCC 8th Edition - Clinical stage from 10/24/2020: Stage IA (cT1b, cN0, cM0, G1, ER+, PR+, HER2-) - Signed by Serena Croissant, MD on 10/24/2020 Stage prefix: Initial diagnosis Histologic grading system: 3 grade system Laterality: Left Staged by: Pathologist and managing physician Stage used in treatment planning: Yes National guidelines used in treatment planning: Yes Type of national guideline used in treatment planning: NCCN   11/15/2020 Surgery   Left lumpectomy: No residual cancer, fibrocystic change, margins negative, 0/3 lymph nodes negative (based on the biopsy tumor size: 2 mm), previous ER 90%, PR 95%, HER2 negative, Ki-67 less than 5%   12/25/2020 - 01/18/2021 Radiation Therapy   Adjuvant radiation   02/21/2021 -  Anti-estrogen  oral therapy   Tamoxifen    Genetic Testing   [facility] Multi-Cancer Panel+RNA was Negative. Report date is 07/01/2022.  The Multi-Cancer + RNA Panel offered by [facility] includes sequencing and/or deletion/duplication analysis of the following 70 genes:  AIP*, ALK, APC*, ATM*, AXIN2*, BAP1*, BARD1*, BLM*, BMPR1A*, BRCA1*, BRCA2*, BRIP1*, CDC73*, CDH1*, CDK4, CDKN1B*, CDKN2A, CHEK2*, CTNNA1*, DICER1*, EPCAM (del/dup only), EGFR, FH*, FLCN*, GREM1 (promoter dup only), HOXB13, KIT, LZTR1, MAX*, MBD4, MEN1*, MET, MITF, MLH1*, MSH2*, MSH3*, MSH6*, MUTYH*, NF1*, NF2*, NTHL1*, PALB2*, PDGFRA, PMS2*, POLD1*, POLE*, POT1*, PRKAR1A*, PTCH1*, PTEN*, RAD51C*, RAD51D*, RB1*, RET, SDHA* (sequencing only), SDHAF2*, SDHB*, SDHC*, SDHD*, SMAD4*, SMARCA4*, SMARCB1*, SMARCE1*, STK11*, SUFU*, TMEM127*, TP53*, TSC1*, TSC2*, VHL*. RNA analysis is performed for * genes.     CHIEF COMPLIANT: Surveillance of breast cancer  HISTORY OF PRESENT ILLNESS:  History of Present Illness   The patient, with history of breast cancer, presents for follow-up regarding her treatment and menopausal status.  She was diagnosed with breast cancer two and a half years ago. A biopsy removed all visible cancer, and subsequent surgery showed no residual cancer. She was on tamoxifen but discontinued it in June due to intolerance. Since stopping tamoxifen, she has not experienced ovarian cysts, which she had while on the medication. She is keeping up with her annual mammograms.  She had a hysterectomy and one ovary removed approximately a year ago. The remaining ovary is still functional, and she has not yet entered menopause. She had hot flashes a few months ago, which have since resolved after starting black cohosh, a supplement.  She experiences intermittent episodes of mastitis-like symptoms, described as a burning, irritated sensation with a small cut-like appearance on the top of the nipple area. These episodes occur on both sides, last a  couple of days, and  resolve without intervention. She has consulted a dermatologist who found no concerning issues.         ALLERGIES:  has no known allergies.  MEDICATIONS:  Current Outpatient Medications  Medication Sig Dispense Refill   ACTEMRA 162 MG/0.9ML SOSY Inject 0.9 mLs (162 mg total) into the skin once a week. For MOGAD Takes weekly on Saturday. 10.8 mL 1   ascorbic acid (VITAMIN C) 1000 MG tablet Take 1 tablet by mouth daily.     cholecalciferol (VITAMIN D3) 25 MCG (1000 UNIT) tablet Take 5,000 Units by mouth daily.     furosemide (LASIX) 20 MG tablet TAKE 1 TABLET BY MOUTH DAILY 90 tablet 3   levothyroxine (SYNTHROID) 88 MCG tablet Take by mouth 88 mcg six days a week 75 tablet 3   MAGNESIUM GLUCONATE PO Take 2 tablets by mouth daily at 12 noon.     omeprazole (PRILOSEC) 20 MG capsule TAKE 1 CAPSULE BY MOUTH DAILY 90 capsule 0   Probiotic Product (CULTRELLE KIDS IMMUNE DEFENSE PO) Take 1 tablet by mouth daily.     UNABLE TO FIND Take 4 tablets by mouth daily. Med Name: Omega Pure 2 tablet in AM and 1 tablet in PM     UNABLE TO FIND Take by mouth daily. Med Name: Ultra Inflamix 2 scoops daily     UNABLE TO FIND Take 2 tablets by mouth daily. Med Name: R-Lipoic Acid     No current facility-administered medications for this visit.    PHYSICAL EXAMINATION: ECOG PERFORMANCE STATUS: 1 - Symptomatic but completely ambulatory  Vitals:   05/14/23 0806  BP: 120/62  Pulse: 92  Resp: 18  Temp: (!) 97.2 F (36.2 C)  SpO2: 95%   Filed Weights   05/14/23 0806  Weight: 190 lb 14.4 oz (86.6 kg)    Physical Exam       No palpable lumps or nodules in bilateral breasts or axilla.  No redness  (exam performed in the presence of a chaperone)  LABORATORY DATA:  I have reviewed the data as listed    Latest Ref Rng & Units 05/06/2023    4:38 PM 10/22/2022    8:51 AM 08/29/2022    9:45 AM  CMP  Glucose 70 - 99 mg/dL 78  90  161   BUN 6 - 24 mg/dL 24  16  18    Creatinine  0.57 - 1.00 mg/dL 0.96  0.45  4.09   Sodium 134 - 144 mmol/L 142  142  143   Potassium 3.5 - 5.2 mmol/L 3.9  4.4  4.4   Chloride 96 - 106 mmol/L 103  107  110   CO2 20 - 29 mmol/L 25  24  28    Calcium 8.7 - 10.2 mg/dL 9.3  8.6  8.6   Total Protein 6.0 - 8.5 g/dL 6.7  6.1  6.1   Total Bilirubin 0.0 - 1.2 mg/dL 0.2  0.3  0.4   Alkaline Phos 44 - 121 IU/L 47  44  35   AST 0 - 40 IU/L 30  21  19    ALT 0 - 32 IU/L 56  28  27     Lab Results  Component Value Date   WBC 5.7 05/06/2023   HGB 14.9 05/06/2023   HCT 45.1 05/06/2023   MCV 97 05/06/2023   PLT 213 05/06/2023   NEUTROABS 2.6 05/06/2023    ASSESSMENT & PLAN:  Malignant neoplasm of lower-inner quadrant of left breast in female, estrogen receptor positive (  HCC) 10/23/2020 screening mammogram on 09/11/20 showed possible distortion with calcifications in the left breast. Diagnostic mammogram and [place] on 10/03/20 showed 0.6 cm group of indeterminate lower inner left breast calcifications with possible associated subtle distortion and no abnormal appearing left axillary lymph nodes. Biopsy on 10/17/20 showed invasive ductal carcinoma, DCIS with calcifications, ER+(90%)/PR(95%)/Ki67(<5%).   11/15/2020:Left lumpectomy: No residual cancer, fibrocystic change, margins negative, 0/3 lymph nodes negative (based on the biopsy tumor size: 2 mm), previous ER 90%, PR 95%, HER2 negative, Ki-67 less than 5%   Adjuvant radiation: 12/25/2020-01/18/2021   Treatment plan: Adjuvant antiestrogen therapy with tamoxifen 10 mg daily x5 yrs started November 2022 discontinued for intolerance (ovarian cysts) Since patient is not menopausal we could not start aromatase number therapy. Patient is not keen on starting any antiestrogen therapy    Breast cancer surveillance: mammogram 09/15/2022: benign Density cat B  Breast Exam: 05/14/23: benign, redness in the right breast nipple areolar complex    2024: Hysterectomy and unilateral oophorectomy. 06/24/2022: Estradiol:  1465 , FSH: 6.4 We will repeat these labs today.   Recommended guardant reveal for MRD testing She did genetic testing and is awaiting the results.   Return to clinic in 1 year for follow-up     Orders Placed This Encounter  Procedures   Estradiol, Sensitive    Standing Status:   Future    Expiration Date:   05/13/2024   FSH-Follicle stimulating hormone    Standing Status:   Future    Expiration Date:   05/13/2024   The patient has a good understanding of the overall plan. she agrees with it. she will call with any problems that may develop before the next visit here. Total time spent: 30 mins including face to face time and time spent for planning, charting and co-ordination of care   Tamsen Meek, MD 05/14/23

## 2023-05-15 ENCOUNTER — Telehealth: Payer: Self-pay

## 2023-05-15 LAB — FOLLICLE STIMULATING HORMONE: FSH: 12.4 m[IU]/mL

## 2023-05-15 NOTE — Telephone Encounter (Signed)
 Per md orders entered for Guardant Reveal and all supported documents faxed to [phone]. Faxed confirmation was received.

## 2023-05-20 ENCOUNTER — Encounter (HOSPITAL_BASED_OUTPATIENT_CLINIC_OR_DEPARTMENT_OTHER): Payer: Self-pay | Admitting: Physical Therapy

## 2023-05-20 ENCOUNTER — Ambulatory Visit (INDEPENDENT_AMBULATORY_CARE_PROVIDER_SITE_OTHER): Payer: Commercial Managed Care - PPO | Admitting: Orthopaedic Surgery

## 2023-05-20 ENCOUNTER — Ambulatory Visit (HOSPITAL_BASED_OUTPATIENT_CLINIC_OR_DEPARTMENT_OTHER): Payer: Commercial Managed Care - PPO | Admitting: Physical Therapy

## 2023-05-20 DIAGNOSIS — R262 Difficulty in walking, not elsewhere classified: Secondary | ICD-10-CM | POA: Insufficient documentation

## 2023-05-20 DIAGNOSIS — M25651 Stiffness of right hip, not elsewhere classified: Secondary | ICD-10-CM | POA: Insufficient documentation

## 2023-05-20 DIAGNOSIS — M6281 Muscle weakness (generalized): Secondary | ICD-10-CM | POA: Insufficient documentation

## 2023-05-20 DIAGNOSIS — M25551 Pain in right hip: Secondary | ICD-10-CM | POA: Insufficient documentation

## 2023-05-20 DIAGNOSIS — Z170 Estrogen receptor positive status [ER+]: Secondary | ICD-10-CM | POA: Diagnosis not present

## 2023-05-20 DIAGNOSIS — C50312 Malignant neoplasm of lower-inner quadrant of left female breast: Secondary | ICD-10-CM | POA: Diagnosis present

## 2023-05-20 DIAGNOSIS — S73191A Other sprain of right hip, initial encounter: Secondary | ICD-10-CM

## 2023-05-20 NOTE — Therapy (Signed)
 OUTPATIENT PHYSICAL THERAPY TREATMENT   Patient Name: Valerie [name] MRN: [id number] DOB:09/07/1970, 53 y.o., female Today's Date: 05/20/2023  END OF SESSION:  PT End of Session - 05/20/23 1236     Visit Number 11    Number of Visits 25    Date for PT Re-Evaluation 06/27/22    Authorization Type UHC  no VL    PT Start Time 1146    PT Stop Time 1226    PT Time Calculation (min) 40 min    Activity Tolerance Patient tolerated treatment well    Behavior During Therapy WFL for tasks assessed/performed                   Past Medical History:  Diagnosis Date   Abnormal echocardiogram 11/28/2021   EF 60 -65%, abnormal global longitudinal strain, see cardiology OV in Epic dated 12/25/21   Breast cancer (HCC)    Cancer (HCC) 09/2020   left breast, ER+, s/p radiation therapy 12/25/20 - 01/18/21, Patient follows with Dr. Vinay Gudena @ [facility].   COVID-19 11/2019   flu-like symptoms, cough, no hospitalizations   Edema    lower extremity, follows w/ [facility], Sherrilee Dinning, [place]. See OV dated 03/24/22 in Epic.   GERD (gastroesophageal reflux disease)    Follows w/ Dr. Rosario Guppy, gastroenterologist and PCP, Dr. Jenkins Jansky [facility].   Hashimoto's disease    Follows with Dr. Tawni Fendt @ [place].   History of hiatal hernia    2 cm hiatal hernia per 01/08/22 EGD   History of radiation therapy 2022   completed 01-18-2021   [facility] (hard of hearing)    has gotten bilateral aides.   Hypothyroidism    Follows w/ endocronologist, Dr. Tawni Fendt @ [place].   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 Charlie Crete @ [facility].   Optic neuritis    Follows w/ opthamology, Dr. 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), 04/2020 left eye   Personal history of  radiation therapy    PONV (postoperative nausea and vomiting)    Status post dilation of esophageal narrowing 01/08/2022   EGD, esophagus dilated   Wears glasses    Past Surgical History:  Procedure Laterality Date   ABDOMINAL HYSTERECTOMY  04/23/22   BIOPSY BREAST Left 10/17/2020   BREAST LUMPECTOMY     BREAST LUMPECTOMY WITH RADIOACTIVE SEED AND SENTINEL LYMPH NODE BIOPSY Left 11/15/2020   Procedure: LEFT BREAST LUMPECTOMY WITH RADIOACTIVE SEED AND LEFT SENTINEL LYMPH NODE BIOPSY;  Surgeon: Vanderbilt Ned, MD;  Location: [facility];  Service: General;  Laterality: Left;   BREAST SURGERY  August 2022   Cancer surgery   COLONOSCOPY  09/06/2021   cecal polyp   CT CORONARY CA SCORING  07/13/2020   Coronary Calcium = 0 (in Epic)   CYSTOSCOPY N/A 04/23/2022   Procedure: CYSTOSCOPY;  Surgeon: Pinal Ronal RAMAN, MD;  Location: [facility];  Service: Gynecology;  Laterality: N/A;   ESOPHAGOGASTRODUODENOSCOPY  01/08/2022   2cm hiatal hernia, esophagus dilated   TOTAL LAPAROSCOPIC HYSTERECTOMY WITH SALPINGECTOMY Bilateral 04/23/2022   Procedure: TOTAL LAPAROSCOPIC HYSTERECTOMY BILATERAL SALPINGECTOMY, LEFT OOPHORECTOMY;  Surgeon: Pinal Ronal RAMAN, MD;  Location: [facility];  Service: Gynecology;  Laterality: Bilateral;   TRANSTHORACIC ECHOCARDIOGRAM  11/28/2021  see results in Epic   TRANSTHORACIC ECHOCARDIOGRAM  07/05/2020   in Epic   TUBAL LIGATION  02/2012   WISDOM TOOTH EXTRACTION     early 1990's   Patient Active Problem List   Diagnosis Date Noted   High risk medication use 05/06/2023   Genetic testing 07/04/2022   Family history of uterine cancer 06/24/2022   Local edema 02/25/2022   Hypothyroidism due to Hashimoto's thyroiditis 06/07/2021   Malignant neoplasm of lower-inner quadrant of left breast in female, estrogen receptor positive (HCC) 10/23/2020   Optic neuritis, left 10/18/2020   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) 07/10/2020   Mitral regurgitation    Neuromyelitis optica (devic) (HCC) 05/07/2020   Hypothyroid 04/25/2020   Optic neuritis, right 04/24/2020     REFERRING PROVIDER:  Genelle Standing, MD    REFERRING DIAG:  [phone] (ICD-10-CM) - Tear of right acetabular labrum, initial encounter   s/p Rt acetabular labral repair  Rationale for Evaluation and Treatment: Rehabilitation  THERAPY DIAG:  Pain in right hip  Difficulty in walking, not elsewhere classified  Stiffness of right hip, not elsewhere classified  Muscle weakness (generalized)  ONSET DATE: DOS 03/31/23   SUBJECTIVE:                                                                                                                                                                                           SUBJECTIVE STATEMENT:  Pt states that the pool helped with the back of the hip pain. She has less anterior hip pain. Pt is going to the pool 3x/week.    Eval: Feeling pretty good. Hip pain for 15 years- best I can think was after a hike with my daughter. Was actually seeing MD for knee when we looked at the hip. 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   PERTINENT HISTORY:  N/a  PAIN:  Are you having pain? no  NPRS: 0/10 Location:  Aggravated by: Relieved by:   PRECAUTIONS:  None  RED FLAGS: None   WEIGHT BEARING RESTRICTIONS:  Yes 2 wk TDWB  FALLS:  Has patient fallen in last 6 months? No  LIVING ENVIRONMENT: Lives with: lives with their spouse and 2 cats Lives in: House ranch with basement (washer/dryer & garage where she parks) Stairs: Yes: Internal: 12-14 steps; on left going up and External: 4 steps; on right going up Has following equipment at home: None  OCCUPATION:  works for [employer] including lifting up 25-30#, pushing & pulling   PLOF:  Independent  PATIENT GOALS:  Back to gym, improve flexibility   OBJECTIVE:  Note: Objective measures  were  completed at Evaluation unless otherwise noted.  PATIENT SURVEYS:  FOTO 25   GAIT: Eval: arrived with bil axillary crutches with brace, using crutches more for stability 1/13: using SPC when walking a lot                                                                                                                          TREATMENT DATE:   2/5 STM R glutes and hip rotators  Mulligan hip mob inf and lateral grade III; ER and flexion bias  8 box step up 4x8  SLS 30s 4x Shuttle leg press 100lbs 3x10 Standing hip flexor stretch (pain) Standing adductor stretch 30s 3x   Discussion of hip ABD/ADD machine; knee ext machine    05/13/23 Pt seen for aquatic therapy today.  Treatment took place in water 3.5-4.75 ft in depth at the [facility] pool. Temp of water was 91.  Pt entered/exited the pool via stairs independently with bilat rail.  -Walking forward/ backward 4 laps with reciprocal arm swing and cues for vertical trunk - side stepping with arm addct/abdct with rainbow hand floats -> yellow hand floats  - farmer carry with single yellow hand float under water, walking forward / backward  - UE on hand floats:  hip circles CW/CCW x 10 each;  heel/ toe raises x 10;  - UE On wall:  single leg clam x 10 each; backward lunges x 10 (alternating LEs)  - staggered stance kick board row (11,12, 1 o'clock)  - Plank to/from superman with UE on yellow hand floats x 10 - Plank with hands on bench in water with hip ext x 10 each LE - side plank into star fish x 5 each side  - partial squats pushing yellow hand float under water x 10 -  straddling noodle and UE on yellow hand floats near corner cycling with LE directly underneath her  1/27  LAD in open pack position   Mulligan hip mob inf and lateral grade III; ER and flexion bias  R hip inf glide (scoop mob) grade III  Heel toe rocking 20x Stool IR/ER 20x FABER slide (unable without manual) Seated figure 4 30s  3x Sidestepping RTB at knees; fwd and retro monster walk 2x each 30-60ft 4 step up and down 3x8  05/08/23 -Aquatic therapy -Walking backward 2 laps, - side stepping 1 lap, with arm addct/abdct x 2 laps - UE On wall:  backward lunges x 10 (RLE back); hip circles CW/CCW x 10 each LE; RLE heel raises x 10 - TrA set with 1/2 hollow noodle pull down x 5-> rainbow hand floats (bilat) pull down to thighs with slow return to surface x 10 - seated on stairs, cycling -> stopped - tricep press ups from 4th step x 10 - Plank with hands on bench in water with hip ext x 10 each LE - Plank to/from superman with UE on yellow hand floats x 10 - straddling noodle and  UE on yellow hand floats near corner cycling with LE directly underneath her  05/04/2023 Tall kneeling glut set- lacking 12 deg hip extension; standing -6 deg hip extension Tall kneel + Rt GHJ flexion Tall kneeling hinge back Tall kneeling bridge Standing lunge + Rt shoulder flexion green tband resist, added heel raise with knee extended STM hip flexors  1/15  Recumbent bike Lvl 1 5 min  Standing hip flexor stretch 30s 2x  Tall kneeling shoulder ext on cable 3x10 Cable staggered stance chest press cable Sidestepping no resistance   Review of gym activity- able to do knee ext, HS curl, and calf machine, no ABD yet but able to do small range ADD machine  R hip inf glide (scoop mob) grade III   04/27/23 STG review Cable machine- tall kneel double & single arm row, straight arm press down, hinge with single arm row Tall mini lunge with single arm cable row & chest press Lateral walking with cable resistance.  STM hip flexors   PATIENT EDUCATION:  Education details: Anatomy of condition, POC, HEP, exercise form/rationale Person educated: Patient Education method: Explanation, Demonstration, Tactile cues, Verbal cues, and Handouts Education comprehension: verbalized understanding, returned demonstration, verbal cues required,  tactile cues required, and needs further education  HOME EXERCISE PROGRAM:  [email or web address]  Access Code: [email or web address]   Aquatic Access Code: [email or web address] URL: [email or web address] Date: 05/13/23 Prepared by: [facility] [address] This aquatic home exercise program from MedBridge utilizes pictures from land based exercises, but has been adapted prior to lamination and issuance.    ASSESSMENT:  CLINICAL IMPRESSION: Pt is 7 wks at this time. Pt with good tolerance to manual therapy and exercise at today's session. Pt able to progress SL activity but does require VC for frontal plane control. Pt does still have discomfort with hip extension stretching so deferred at this time. Continue per protocol at next with progressive strength and ROM. Pt would benefit from continued skilled therapy in order to reach goals and maximize functional R LE  strength and ROM for return to occupation, ADL, and normalized community mobility.    REHAB POTENTIAL: Good  CLINICAL DECISION MAKING: Stable/uncomplicated  EVALUATION COMPLEXITY: Low   GOALS: Goals reviewed with patient? Yes  SHORT TERM GOALS: Target date: 4 weeks 04/28/23  Pain free flexion to 90 Baseline: Goal status: achieved   2.  Demo controlled quad set with hold Baseline:  Goal status: Achieved   3.  30s sit to stand without pain or compensation Baseline:  Goal status: achieved   4.  SLS 30s without compensation Baseline:  Goal status: achieved  LONG TERM GOALS:  Able to demo step up on 6 step with level pelvis and proper form Baseline:  Goal status: INITIAL Week 8 05/26/23  2.  Will tolerate at least 3 min on elliptical, demonstrating good tolerance to repetitive weight bearing motion Baseline:  Goal status: INITIAL  Week 8 05/26/23  3.  Demonstrate proper form in at least 10 continuous lunges without increased pain Baseline:  Goal status: INITIAL  Week 12 06/23/23  4.  Demonstrate  gentle, double and single foot plyometric motions with good proximal form Baseline:  Goal status: INITIAL Week 12 06/23/23      PLAN:  PT FREQUENCY: 1-2x/week  PT DURATION: 12 weeks  PLANNED INTERVENTIONS: 97164- PT Re-evaluation, 97110-Therapeutic exercises, 97530- Therapeutic activity, 97112- Neuromuscular re-education, 97535- Self Care, [id number]- Manual therapy, [phone]- Gait training, [phone]- Aquatic Therapy, Patient/Family education, Balance training, Stair training, Taping, Dry  Needling, Joint mobilization, Spinal mobilization, Scar mobilization, Cryotherapy, and Moist heat.  PLAN FOR NEXT SESSION: per protocol  Dale Call PT, DPT 05/20/23 12:40 PM

## 2023-05-20 NOTE — Progress Notes (Signed)
 Post Operative Evaluation    Procedure/Date of Surgery: Right hip labral repair 12/17  Interval History:     Presents today 6 weeks status post above procedure.  Overall she is doing extremely well.  She has continued progress with physical therapy.  She is now walking without any type of groin pain.   PMH/PSH/Family History/Social History/Meds/Allergies:    Past Medical History:  Diagnosis Date   Abnormal echocardiogram 11/28/2021   EF 60 -65%, abnormal global longitudinal strain, see cardiology OV in Epic dated 12/25/21   Breast cancer (HCC)    Cancer (HCC) 09/2020   left breast, ER+, s/p radiation therapy 12/25/20 - 01/18/21, Patient follows with Dr. Mackey Chad @ [facility].   COVID-19 11/2019   flu-like symptoms, cough, no hospitalizations   Edema    lower extremity, follows w/ [facility], Sherrilee Dinning, [place]. See OV dated 03/24/22 in Epic.   GERD (gastroesophageal reflux disease)    Follows w/ Dr. Rosario Guppy, gastroenterologist and PCP, Dr. Jenkins Jansky [facility].   Hashimoto's disease    Follows with Dr. Tawni Fendt @ [place].   History of hiatal hernia    2 cm hiatal hernia per 01/08/22 EGD   History of radiation therapy 2022   completed 01-18-2021   [facility] (hard of hearing)    has gotten bilateral aides.   Hypothyroidism    Follows w/ endocronologist, Dr. Tawni Fendt @ .   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 Charlie Crete @ [facility].   Optic neuritis    Follows w/ opthamology, Dr. 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), 04/2020 left eye   Personal history of radiation therapy    PONV (postoperative nausea and vomiting)    Status post dilation of esophageal narrowing 01/08/2022   EGD, esophagus dilated    Wears glasses    Past Surgical History:  Procedure Laterality Date   ABDOMINAL HYSTERECTOMY  04/23/22   BIOPSY BREAST Left 10/17/2020   BREAST LUMPECTOMY     BREAST LUMPECTOMY WITH RADIOACTIVE SEED AND SENTINEL LYMPH NODE BIOPSY Left 11/15/2020   Procedure: LEFT BREAST LUMPECTOMY WITH RADIOACTIVE SEED AND LEFT SENTINEL LYMPH NODE BIOPSY;  Surgeon: Vanderbilt Ned, MD;  Location: [facility];  Service: General;  Laterality: Left;   BREAST SURGERY  August 2022   Cancer surgery   COLONOSCOPY  09/06/2021   cecal polyp   CT CORONARY CA SCORING  07/13/2020   Coronary Calcium = 0 (in Epic)   CYSTOSCOPY N/A 04/23/2022   Procedure: CYSTOSCOPY;  Surgeon: Pinal Ronal RAMAN, MD;  Location: [facility];  Service: Gynecology;  Laterality: N/A;   ESOPHAGOGASTRODUODENOSCOPY  01/08/2022   2cm hiatal hernia, esophagus dilated   TOTAL LAPAROSCOPIC HYSTERECTOMY WITH SALPINGECTOMY Bilateral 04/23/2022   Procedure: TOTAL LAPAROSCOPIC HYSTERECTOMY BILATERAL SALPINGECTOMY, LEFT OOPHORECTOMY;  Surgeon: Pinal Ronal RAMAN, MD;  Location: [facility];  Service: Gynecology;  Laterality: Bilateral;   TRANSTHORACIC ECHOCARDIOGRAM  11/28/2021   see results in Epic   TRANSTHORACIC ECHOCARDIOGRAM  07/05/2020   in Epic   TUBAL LIGATION  02/2012   WISDOM TOOTH EXTRACTION  early 1990's   Social History   Socioeconomic History   Marital status: Married    Spouse name: Not on file   Number of children: 2   Years of education: BS   Highest education level: Bachelor's degree (e.g., BA, AB, BS)  Occupational History   Occupation: Psychologist, Occupational   Occupation: Shipping and receiving clerk  Tobacco Use   Smoking status: Never   Smokeless tobacco: Not on file  Vaping Use   Vaping status: Never Used  Substance and Sexual Activity   Alcohol use: Never   Drug use: Never   Sexual activity: Yes    Birth control/protection: Surgical, None    Comment: hysterectomy  Other  Topics Concern   Not on file  Social History Narrative   Right handed    Caffeine use: rare   Social Drivers of Health   Financial Resource Strain: Low Risk  (03/05/2023)   Overall Financial Resource Strain (CARDIA)    Difficulty of Paying Living Expenses: Not hard at all  Food Insecurity: No Food Insecurity (03/05/2023)   Hunger Vital Sign    Worried About Running Out of Food in the Last Year: Never true    Ran Out of Food in the Last Year: Never true  Transportation Needs: No Transportation Needs (03/05/2023)   PRAPARE - Administrator, Civil Service (Medical): No    Lack of Transportation (Non-Medical): No  Physical Activity: Insufficiently Active (03/05/2023)   Exercise Vital Sign    Days of Exercise per Week: 2 days    Minutes of Exercise per Session: 30 min  Stress: No Stress Concern Present (03/05/2023)   [employer] of Occupational Health - Occupational Stress Questionnaire    Feeling of Stress : Only a little  Social Connections: Moderately Integrated (03/05/2023)   Social Connection and Isolation Panel [NHANES]    Frequency of Communication with Friends and Family: Once a week    Frequency of Social Gatherings with Friends and Family: Once a week    Attends Religious Services: More than 4 times per year    Active Member of [employer] or Organizations: Yes    Attends Engineer, Structural: More than 4 times per year    Marital Status: Married   Family History  Problem Relation Age of Onset   Hearing loss Mother    Thyroid  disease Mother    Cancer Mother 61       endometrial cancer, lynch negative   GER disease Mother    Other Mother        uterine fibroids, optic disc abnormality    Eczema Mother    Colon polyps Mother    Hypothyroidism Mother    Osteoarthritis Mother    Hypertension Mother    Migraines Mother    Mitral valve prolapse Father        regurgitation/prolase   Colon polyps Father    Hearing loss Father    Other Father         Colon adenoma   Hyperlipidemia Father    GER disease Father    Heart murmur Father    Amblyopia Father        Right eye   Neuropathy Father    Aortic aneurysm Father    Heart murmur Brother    Glaucoma Maternal Grandmother    Macular degeneration Maternal Grandmother    Arthritis Maternal Grandmother    Arthritis Maternal Grandfather    Multiple sclerosis Paternal Grandmother    Heart murmur Paternal  Grandfather    Heart disease Paternal Grandfather    Diabetes Maternal Aunt    Heart murmur Paternal Aunt    Breast cancer Neg Hx    Colon cancer Neg Hx    Esophageal cancer Neg Hx    Rectal cancer Neg Hx    Stomach cancer Neg Hx    No Known Allergies Current Outpatient Medications  Medication Sig Dispense Refill   ACTEMRA  162 MG/0.9ML SOSY Inject 0.9 mLs (162 mg total) into the skin once a week. For MOGAD Takes weekly on Saturday. 10.8 mL 1   ascorbic acid  (VITAMIN C) 1000 MG tablet Take 1 tablet by mouth daily.     cholecalciferol  (VITAMIN D3) 25 MCG (1000 UNIT) tablet Take 5,000 Units by mouth daily.     furosemide  (LASIX ) 20 MG tablet TAKE 1 TABLET BY MOUTH DAILY 90 tablet 3   levothyroxine  (SYNTHROID ) 88 MCG tablet Take by mouth 88 mcg six days a week 75 tablet 3   MAGNESIUM GLUCONATE PO Take 2 tablets by mouth daily at 12 noon.     omeprazole  (PRILOSEC) 20 MG capsule TAKE 1 CAPSULE BY MOUTH DAILY 90 capsule 0   Probiotic Product (CULTRELLE KIDS IMMUNE DEFENSE PO) Take 1 tablet by mouth daily.     UNABLE TO FIND Take 4 tablets by mouth daily. Med Name: Omega Pure 2 tablet in AM and 1 tablet in PM     UNABLE TO FIND Take by mouth daily. Med Name: Ultra Inflamix 2 scoops daily     UNABLE TO FIND Take 2 tablets by mouth daily. Med Name: R-Lipoic Acid     No current facility-administered medications for this visit.   No results found.  Review of Systems:   A ROS was performed including pertinent positives and negatives as documented in the HPI.   Musculoskeletal Exam:     Last menstrual period 12/23/2021.  Right hip incisions are well-appearing without erythema or drainage.  30 degrees internal/external rotation of the right hip.  Walks with mild antalgic gait.  Distal neurosensory exam is intact  Imaging:      I personally reviewed and interpreted the radiographs.   Assessment:   6 weeks status post right hip labral repair.  She is continuing to improve.  She is working on print production planner.  Overall I do believe she would benefit from some additional aquatic therapy.  I will plan to see her back in 6 weeks for reassessment  Plan :    -Return to clinic 6 weeks for reassessment      I personally saw and evaluated the patient, and participated in the management and treatment plan.  Elspeth Parker, MD Attending Physician, Orthopedic Surgery  This document was dictated using Dragon voice recognition software. A reasonable attempt at proof reading has been made to minimize errors.

## 2023-05-21 ENCOUNTER — Other Ambulatory Visit: Payer: Self-pay | Admitting: *Deleted

## 2023-05-21 DIAGNOSIS — C50312 Malignant neoplasm of lower-inner quadrant of left female breast: Secondary | ICD-10-CM

## 2023-05-22 ENCOUNTER — Ambulatory Visit (HOSPITAL_BASED_OUTPATIENT_CLINIC_OR_DEPARTMENT_OTHER): Payer: Commercial Managed Care - PPO | Admitting: Physical Therapy

## 2023-05-22 ENCOUNTER — Encounter (HOSPITAL_BASED_OUTPATIENT_CLINIC_OR_DEPARTMENT_OTHER): Payer: Self-pay | Admitting: Physical Therapy

## 2023-05-22 DIAGNOSIS — R262 Difficulty in walking, not elsewhere classified: Secondary | ICD-10-CM

## 2023-05-22 DIAGNOSIS — C50312 Malignant neoplasm of lower-inner quadrant of left female breast: Secondary | ICD-10-CM | POA: Diagnosis not present

## 2023-05-22 DIAGNOSIS — M6281 Muscle weakness (generalized): Secondary | ICD-10-CM

## 2023-05-22 DIAGNOSIS — M25651 Stiffness of right hip, not elsewhere classified: Secondary | ICD-10-CM

## 2023-05-22 DIAGNOSIS — M25551 Pain in right hip: Secondary | ICD-10-CM

## 2023-05-22 NOTE — Therapy (Signed)
 OUTPATIENT PHYSICAL THERAPY TREATMENT   Patient Name: Valerie [name] MRN: [id number] DOB:1971/01/27, 53 y.o., female Today's Date: 05/22/2023  END OF SESSION:  PT End of Session - 05/22/23 1604     Visit Number 12    Number of Visits 25    Date for PT Re-Evaluation 06/27/22    Authorization Type UHC  no VL    PT Start Time 1600    PT Stop Time 1640    PT Time Calculation (min) 40 min    Behavior During Therapy [facility] for tasks assessed/performed                   Past Medical History:  Diagnosis Date   Abnormal echocardiogram 11/28/2021   EF 60 -65%, abnormal global longitudinal strain, see cardiology OV in Epic dated 12/25/21   Breast cancer (HCC)    Cancer (HCC) 09/2020   left breast, ER+, s/p radiation therapy 12/25/20 - 01/18/21, Patient follows with Dr. Vinay Gudena @ [facility].   COVID-19 11/2019   flu-like symptoms, cough, no hospitalizations   Edema    lower extremity, follows w/ [facility], Sherrilee Dinning, [place]. See OV dated 03/24/22 in Epic.   GERD (gastroesophageal reflux disease)    Follows w/ Dr. Rosario Guppy, gastroenterologist and PCP, Dr. Jenkins Jansky [facility] @ [facility].   Hashimoto's disease    Follows with Dr. Tawni Fendt @ [place].   History of hiatal hernia    2 cm hiatal hernia per 01/08/22 EGD   History of radiation therapy 2022   completed 01-18-2021   [facility] (hard of hearing)    has gotten bilateral aides.   Hypothyroidism    Follows w/ endocronologist, Dr. Tawni Fendt @ [place].   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 Charlie Crete @ [facility].   Optic neuritis    Follows w/ opthamology, Dr. 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), 04/2020 left eye   Personal history of radiation therapy    PONV (postoperative nausea and  vomiting)    Status post dilation of esophageal narrowing 01/08/2022   EGD, esophagus dilated   Wears glasses    Past Surgical History:  Procedure Laterality Date   ABDOMINAL HYSTERECTOMY  04/23/22   BIOPSY BREAST Left 10/17/2020   BREAST LUMPECTOMY     BREAST LUMPECTOMY WITH RADIOACTIVE SEED AND SENTINEL LYMPH NODE BIOPSY Left 11/15/2020   Procedure: LEFT BREAST LUMPECTOMY WITH RADIOACTIVE SEED AND LEFT SENTINEL LYMPH NODE BIOPSY;  Surgeon: Vanderbilt Ned, MD;  Location: [facility];  Service: General;  Laterality: Left;   BREAST SURGERY  August 2022   Cancer surgery   COLONOSCOPY  09/06/2021   cecal polyp   CT CORONARY CA SCORING  07/13/2020   Coronary Calcium = 0 (in Epic)   CYSTOSCOPY N/A 04/23/2022   Procedure: CYSTOSCOPY;  Surgeon: Pinal Ronal RAMAN, MD;  Location: [facility];  Service: Gynecology;  Laterality: N/A;   ESOPHAGOGASTRODUODENOSCOPY  01/08/2022   2cm hiatal hernia, esophagus dilated   TOTAL LAPAROSCOPIC HYSTERECTOMY WITH SALPINGECTOMY Bilateral 04/23/2022   Procedure: TOTAL LAPAROSCOPIC HYSTERECTOMY BILATERAL SALPINGECTOMY, LEFT OOPHORECTOMY;  Surgeon: Pinal Ronal RAMAN, MD;  Location: [facility];  Service: Gynecology;  Laterality: Bilateral;   TRANSTHORACIC ECHOCARDIOGRAM  11/28/2021   see results in Epic   TRANSTHORACIC  ECHOCARDIOGRAM  07/05/2020   in Epic   TUBAL LIGATION  02/2012   WISDOM TOOTH EXTRACTION     early 1990's   Patient Active Problem List   Diagnosis Date Noted   High risk medication use 05/06/2023   Genetic testing 07/04/2022   Family history of uterine cancer 06/24/2022   Local edema 02/25/2022   Hypothyroidism due to Hashimoto's thyroiditis 06/07/2021   Malignant neoplasm of lower-inner quadrant of left breast in female, estrogen receptor positive (HCC) 10/23/2020   Optic neuritis, left 10/18/2020   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) 07/10/2020   Mitral  regurgitation    Neuromyelitis optica (devic) (HCC) 05/07/2020   Hypothyroid 04/25/2020   Optic neuritis, right 04/24/2020     REFERRING PROVIDER:  Genelle Standing, MD    REFERRING DIAG:  [phone] (ICD-10-CM) - Tear of right acetabular labrum, initial encounter   s/p Rt acetabular labral repair  Rationale for Evaluation and Treatment: Rehabilitation  THERAPY DIAG:  Pain in right hip  Difficulty in walking, not elsewhere classified  Stiffness of right hip, not elsewhere classified  Muscle weakness (generalized)  ONSET DATE: DOS 03/31/23   SUBJECTIVE:                                                                                                                                                                                           SUBJECTIVE STATEMENT:  Pt states that her R hip feels stiff and achy. She completed LE exercises on machines 2x this week ( leg press, hip abdct, hip addct, knee ext, hamstring curls).   PERTINENT HISTORY:  N/a  PAIN:  Are you having pain? no  NPRS: 0/10 Location:  Aggravated by: Relieved by:   PRECAUTIONS:  None  RED FLAGS: None   WEIGHT BEARING RESTRICTIONS:  Yes 2 wk TDWB  FALLS:  Has patient fallen in last 6 months? No  LIVING ENVIRONMENT: Lives with: lives with their spouse and 2 cats Lives in: House ranch with basement (washer/dryer & garage where she parks) Stairs: Yes: Internal: 12-14 steps; on left going up and External: 4 steps; on right going up Has following equipment at home: None  OCCUPATION:  works for [employer] including lifting up 25-30#, pushing & pulling   PLOF:  Independent  PATIENT GOALS:  Back to gym, improve flexibility   OBJECTIVE:  Note: Objective measures were completed at Evaluation unless otherwise noted.  PATIENT SURVEYS:  FOTO 25   GAIT: Eval: arrived with bil axillary crutches with brace, using crutches more for stability 1/13: using SPC when walking a lot  TREATMENT DATE:  05/22/23 Pt seen for aquatic therapy today.  Treatment took place in water 3.5-4.75 ft in depth at the [facility] pool. Temp of water was 91.  Pt entered/exited the pool via stairs independently with bilat rail.  -Walking forward/ backward 4 laps with reciprocal arm swing and cues for vertical trunk - side stepping with arm addct/abdct with yellow hand floats x 2 laps - UE on hand floats:  hip circles CW/CCW x 10 each - UE on wall:  single leg clam x 10 each  - Plank to/from superman with UE on yellow hand floats x 10 - plank on barbell with hip ext x 10 each LE - side plank on barbell -> on blue hand float x 30s each -cycling while suspended pushing bilat blue hand floats under water to engage more core - single leg superman x 5 each side  - tall kneeling on yellow noodle with light UE support on wall (good challenge) - balance standing on yellow noodle and light marching - 3 way LE stretch RLE, (ITB, hamstring, adductor) x 15s each, foot on hollow blue noodle - R quad and hip flexor stretch with ankle on hollow blue noodle x 20s   2/5 STM R glutes and hip rotators  Mulligan hip mob inf and lateral grade III; ER and flexion bias  8 box step up 4x8  SLS 30s 4x Shuttle leg press 100lbs 3x10 Standing hip flexor stretch (pain) Standing adductor stretch 30s 3x   Discussion of hip ABD/ADD machine; knee ext machine    05/13/23 Pt seen for aquatic therapy today.  Treatment took place in water 3.5-4.75 ft in depth at the [facility] pool. Temp of water was 91.  Pt entered/exited the pool via stairs independently with bilat rail.  -Walking forward/ backward 4 laps with reciprocal arm swing and cues for vertical trunk - side stepping with arm addct/abdct with rainbow hand floats -> yellow hand floats  - farmer carry with single yellow hand float  under water, walking forward / backward  - UE on hand floats:  hip circles CW/CCW x 10 each;  heel/ toe raises x 10;  - UE On wall:  single leg clam x 10 each; backward lunges x 10 (alternating LEs)  - staggered stance kick board row (11,12, 1 o'clock)  - Plank to/from superman with UE on yellow hand floats x 10 - Plank with hands on bench in water with hip ext x 10 each LE - side plank into star fish x 5 each side  - partial squats pushing yellow hand float under water x 10 -  straddling noodle and UE on yellow hand floats near corner cycling with LE directly underneath her  1/27  LAD in open pack position   Mulligan hip mob inf and lateral grade III; ER and flexion bias  R hip inf glide (scoop mob) grade III  Heel toe rocking 20x Stool IR/ER 20x FABER slide (unable without manual) Seated figure 4 30s 3x Sidestepping RTB at knees; fwd and retro monster walk 2x each 30-52ft 4 step up and down 3x8  05/08/23 -Aquatic therapy -Walking backward 2 laps, - side stepping 1 lap, with arm addct/abdct x 2 laps - UE On wall:  backward lunges x 10 (RLE back); hip circles CW/CCW x 10 each LE; RLE heel raises x 10 - TrA set with 1/2 hollow noodle pull down x 5-> rainbow hand floats (bilat) pull down to thighs with slow return to surface x 10 -  seated on stairs, cycling -> stopped - tricep press ups from 4th step x 10 - Plank with hands on bench in water with hip ext x 10 each LE - Plank to/from superman with UE on yellow hand floats x 10 - straddling noodle and UE on yellow hand floats near corner cycling with LE directly underneath her  05/04/2023 Tall kneeling glut set- lacking 12 deg hip extension; standing -6 deg hip extension Tall kneel + Rt GHJ flexion Tall kneeling hinge back Tall kneeling bridge Standing lunge + Rt shoulder flexion green tband resist, added heel raise with knee extended STM hip flexors  1/15  Recumbent bike Lvl 1 5 min  Standing hip flexor stretch 30s 2x   Tall kneeling shoulder ext on cable 3x10 Cable staggered stance chest press cable Sidestepping no resistance   Review of gym activity- able to do knee ext, HS curl, and calf machine, no ABD yet but able to do small range ADD machine  R hip inf glide (scoop mob) grade III   04/27/23 STG review Cable machine- tall kneel double & single arm row, straight arm press down, hinge with single arm row Tall mini lunge with single arm cable row & chest press Lateral walking with cable resistance.  STM hip flexors   PATIENT EDUCATION:  Education details: Anatomy of condition, POC, HEP, exercise form/rationale Person educated: Patient Education method: Explanation, Demonstration, Tactile cues, Verbal cues, and Handouts Education comprehension: verbalized understanding, returned demonstration, verbal cues required, tactile cues required, and needs further education  HOME EXERCISE PROGRAM:  [email or web address]  Access Code: [email or web address]   Aquatic Access Code: [email or web address] URL: [email or web address] Date: 05/13/23 Prepared by: [facility] [address] This aquatic home exercise program from Med[employer] utilizes pictures from land based exercises, but has been adapted prior to lamination and issuance.    ASSESSMENT:  CLINICAL IMPRESSION: Pt reported increased ache in Rt hip with single leg supermans, when pulling RLE into hip ext; resolved with change in exercise.  Incorporated more core and balance work into today's session.  Will continue to progress aquatic HEP as tolerated. Pt would benefit from continued skilled therapy in order to reach goals and maximize functional R LE  strength and ROM for return to occupation, ADL, and normalized community mobility.    REHAB POTENTIAL: Good  CLINICAL DECISION MAKING: Stable/uncomplicated  EVALUATION COMPLEXITY: Low   GOALS: Goals reviewed with patient? Yes  SHORT TERM GOALS: Target date: 4 weeks 04/28/23  Pain  free flexion to 90 Baseline: Goal status: achieved   2.  Demo controlled quad set with hold Baseline:  Goal status: Achieved   3.  30s sit to stand without pain or compensation Baseline:  Goal status: achieved   4.  SLS 30s without compensation Baseline:  Goal status: achieved  LONG TERM GOALS:  Able to demo step up on 6 step with level pelvis and proper form Baseline:  Goal status: INITIAL Week 8 05/26/23  2.  Will tolerate at least 3 min on elliptical, demonstrating good tolerance to repetitive weight bearing motion Baseline:  Goal status: INITIAL  Week 8 05/26/23  3.  Demonstrate proper form in at least 10 continuous lunges without increased pain Baseline:  Goal status: INITIAL  Week 12 06/23/23  4.  Demonstrate gentle, double and single foot plyometric motions with good proximal form Baseline:  Goal status: INITIAL Week 12 06/23/23      PLAN:  PT FREQUENCY: 1-2x/week  PT DURATION: 12 weeks  PLANNED INTERVENTIONS: 02835-  PT Re-evaluation, 97110-Therapeutic exercises, 97530- Therapeutic activity, [id number]- Neuromuscular re-education, [phone]- Self Care, 02859- Manual therapy, [phone]- Gait training, [phone]- Aquatic Therapy, Patient/Family education, Balance training, Stair training, Taping, Dry Needling, Joint mobilization, Spinal mobilization, Scar mobilization, Cryotherapy, and Moist heat.  PLAN FOR NEXT SESSION: per protocol  Delon Aquas, PTA 05/22/23 4:48 PM [facility] [address] [place], [place], [address] Phone: [phone]   Fax:  [phone]

## 2023-05-26 ENCOUNTER — Encounter (HOSPITAL_BASED_OUTPATIENT_CLINIC_OR_DEPARTMENT_OTHER): Payer: Self-pay | Admitting: Physical Therapy

## 2023-05-27 ENCOUNTER — Ambulatory Visit (HOSPITAL_BASED_OUTPATIENT_CLINIC_OR_DEPARTMENT_OTHER): Payer: Commercial Managed Care - PPO | Admitting: Physical Therapy

## 2023-05-27 ENCOUNTER — Inpatient Hospital Stay: Payer: Commercial Managed Care - PPO | Attending: Hematology and Oncology

## 2023-05-27 ENCOUNTER — Encounter (HOSPITAL_BASED_OUTPATIENT_CLINIC_OR_DEPARTMENT_OTHER): Payer: Self-pay | Admitting: Physical Therapy

## 2023-05-27 DIAGNOSIS — M25551 Pain in right hip: Secondary | ICD-10-CM

## 2023-05-27 DIAGNOSIS — M6281 Muscle weakness (generalized): Secondary | ICD-10-CM

## 2023-05-27 DIAGNOSIS — C50312 Malignant neoplasm of lower-inner quadrant of left female breast: Secondary | ICD-10-CM | POA: Insufficient documentation

## 2023-05-27 DIAGNOSIS — Z170 Estrogen receptor positive status [ER+]: Secondary | ICD-10-CM | POA: Insufficient documentation

## 2023-05-27 DIAGNOSIS — M25651 Stiffness of right hip, not elsewhere classified: Secondary | ICD-10-CM

## 2023-05-27 DIAGNOSIS — R262 Difficulty in walking, not elsewhere classified: Secondary | ICD-10-CM

## 2023-05-27 NOTE — Therapy (Signed)
OUTPATIENT PHYSICAL THERAPY TREATMENT   Patient Name: Valerie [name] MRN: [id number] DOB:1970/10/27, 53 y.o., female Today's Date: 05/27/2023  END OF SESSION:  PT End of Session - 05/27/23 1156     Visit Number 13    Number of Visits 25    Date for PT Re-Evaluation 06/27/22    Authorization Type UHC  no VL    PT Start Time 1147    PT Stop Time 1229    PT Time Calculation (min) 42 min    Activity Tolerance Patient tolerated treatment well    Behavior During Therapy WFL for tasks assessed/performed                    Past Medical History:  Diagnosis Date   Abnormal echocardiogram 11/28/2021   EF 60 -65%, abnormal global longitudinal strain, see cardiology OV in Epic dated 12/25/21   Breast cancer (HCC)    Cancer (HCC) 09/2020   left breast, ER+, s/p radiation therapy 12/25/20 - 01/18/21, Patient follows with Dr. Serena Croissant @ [facility].   COVID-19 11/2019   flu-like symptoms, cough, no hospitalizations   Edema    lower extremity, follows w/ [facility], Sabino Dick, [place]. See OV dated 03/24/22 in Epic.   GERD (gastroesophageal reflux disease)    Follows w/ Dr. Judeen Hammans, gastroenterologist and PCP, [facility].   Hashimoto's disease    Follows with Dr. Ernest Haber @ [facility].   History of hiatal hernia    2 cm hiatal hernia per 01/08/22 EGD   History of radiation therapy 2022   completed 01-18-2021   [facility] (hard of hearing)    has gotten bilateral aides.   Hypothyroidism    Follows w/ endocronologist, Dr. Ernest Haber @ [facility].   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 Despina Arias @ [facility].   Optic neuritis    Follows w/ opthamology, Dr. Mora Appl.03/2020 right eye (almost complete blindness), 04/2020 left eye   Personal history of  radiation therapy    PONV (postoperative nausea and vomiting)    Status post dilation of esophageal narrowing 01/08/2022   EGD, esophagus dilated   Wears glasses    Past Surgical History:  Procedure Laterality Date   ABDOMINAL HYSTERECTOMY  04/23/22   BIOPSY BREAST Left 10/17/2020   BREAST LUMPECTOMY     BREAST LUMPECTOMY WITH RADIOACTIVE SEED AND SENTINEL LYMPH NODE BIOPSY Left 11/15/2020   Procedure: LEFT BREAST LUMPECTOMY WITH RADIOACTIVE SEED AND LEFT SENTINEL LYMPH NODE BIOPSY;  Surgeon: Harriette Bouillon, MD;  Location: [facility];  Service: General;  Laterality: Left;   BREAST SURGERY  August 2022   Cancer surgery   COLONOSCOPY  09/06/2021   cecal polyp   CT CORONARY CA SCORING  07/13/2020   Coronary Calcium = 0 (in Epic)   CYSTOSCOPY N/A 04/23/2022   Procedure: CYSTOSCOPY;  Surgeon: Jerene Bears, MD;  [facility];  Service: Gynecology;  Laterality: N/A;   ESOPHAGOGASTRODUODENOSCOPY  01/08/2022   2cm hiatal hernia, esophagus dilated   TOTAL LAPAROSCOPIC HYSTERECTOMY WITH SALPINGECTOMY Bilateral 04/23/2022   Procedure: TOTAL LAPAROSCOPIC HYSTERECTOMY BILATERAL SALPINGECTOMY, LEFT OOPHORECTOMY;  Surgeon: Jerene Bears, MD;  Location: [facility];  Service: Gynecology;  Laterality: Bilateral;   TRANSTHORACIC ECHOCARDIOGRAM  11/28/2021   see results in Epic   TRANSTHORACIC ECHOCARDIOGRAM  07/05/2020   in Epic   TUBAL LIGATION  02/2012   WISDOM TOOTH EXTRACTION     early 1990's   Patient Active Problem List   Diagnosis Date Noted   High risk medication use 05/06/2023   Genetic testing 07/04/2022   Family history of uterine cancer 06/24/2022   Local edema 02/25/2022   Hypothyroidism due to Hashimoto's thyroiditis 06/07/2021   Malignant neoplasm of lower-inner quadrant of left breast in female, estrogen receptor positive (HCC) 10/23/2020   Optic neuritis, left 10/18/2020   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) 07/10/2020   Mitral regurgitation    Neuromyelitis optica (devic) (HCC) 05/07/2020   Hypothyroid 04/25/2020   Optic neuritis, right 04/24/2020     REFERRING PROVIDER:  Huel Cote, MD    REFERRING DIAG:  [phone] (ICD-10-CM) - Tear of right acetabular labrum, initial encounter   s/p Rt acetabular labral repair  Rationale for Evaluation and Treatment: Rehabilitation  THERAPY DIAG:  Pain in right hip  Difficulty in walking, not elsewhere classified  Stiffness of right hip, not elsewhere classified  Muscle weakness (generalized)  ONSET DATE: DOS 03/31/23   SUBJECTIVE:                                                                                                                                                                                           SUBJECTIVE STATEMENT:  Pt states the aching into the front of the hip had gone until just now today. She is no longer using cane at work.   PERTINENT HISTORY:  N/a  PAIN:  Are you having pain? no  NPRS: 0/10 Location:  Aggravated by: Relieved by:   PRECAUTIONS:  None  RED FLAGS: None   WEIGHT BEARING RESTRICTIONS:  Yes 2 wk TDWB  FALLS:  Has patient fallen in last 6 months? No  LIVING ENVIRONMENT: Lives with: lives with their spouse and 2 cats Lives in: House ranch with basement (washer/dryer & garage where she parks) Stairs: Yes: Internal: 12-14 steps; on left going up and External: 4 steps; on right going up Has following equipment at home: None  OCCUPATION:  works for [employer] including lifting up 25-30#, pushing & pulling   PLOF:  Independent  PATIENT GOALS:  Back to gym, improve flexibility   OBJECTIVE:  Note: Objective measures were completed at Evaluation unless otherwise noted.  PATIENT SURVEYS:  FOTO 25   GAIT: Eval: arrived with bil axillary crutches with brace, using crutches more for stability 1/13: using SPC when walking a lot  TREATMENT DATE:   2/12   STM R glutes and hip rotators LAD ER and flexion bias  Self foam rolling/massage for quad and R lateral hip 6" lateral step down 3x8 SLS 30s 4x (airex)  10lb KB goblet squat to table 3x8  Gym and aquatic exercise modifications    05/22/23 Pt seen for aquatic therapy today.  Treatment took place in water 3.5-4.75 ft in depth at the [facility] pool. Temp of water was 91.  Pt entered/exited the pool via stairs independently with bilat rail.  -Walking forward/ backward 4 laps with reciprocal arm swing and cues for vertical trunk - side stepping with arm addct/abdct with yellow hand floats x 2 laps - UE on hand floats:  hip circles CW/CCW x 10 each - UE on wall:  single leg clam x 10 each  - Plank to/from superman with UE on yellow hand floats x 10 - plank on barbell with hip ext x 10 each LE - side plank on barbell -> on blue hand float x 30s each -cycling while suspended pushing bilat blue hand floats under water to engage more core - single leg superman x 5 each side  - tall kneeling on yellow noodle with light UE support on wall (good challenge) - balance standing on yellow noodle and light marching - 3 way LE stretch RLE, (ITB, hamstring, adductor) x 15s each, foot on hollow blue noodle - R quad and hip flexor stretch with ankle on hollow blue noodle x 20s   2/5 STM R glutes and hip rotators  Mulligan hip mob inf and lateral grade III; ER and flexion bias  8" box step up 4x8  SLS 30s 4x Shuttle leg press 100lbs 3x10 Standing hip flexor stretch (pain) Standing adductor stretch 30s 3x   Discussion of hip ABD/ADD machine; knee ext machine    05/13/23 Pt seen for aquatic therapy today.  Treatment took place in water 3.5-4.75 ft in depth at the [facility] pool. Temp of water was 91.  Pt entered/exited the pool via stairs  independently with bilat rail.  -Walking forward/ backward 4 laps with reciprocal arm swing and cues for vertical trunk - side stepping with arm addct/abdct with rainbow hand floats -> yellow hand floats  - farmer carry with single yellow hand float under water, walking forward / backward  - UE on hand floats:  hip circles CW/CCW x 10 each;  heel/ toe raises x 10;  - UE On wall:  single leg clam x 10 each; backward lunges x 10 (alternating LEs)  - staggered stance kick board row (11,12, 1 o'clock)  - Plank to/from superman with UE on yellow hand floats x 10 - Plank with hands on bench in water with hip ext x 10 each LE - side plank into star fish x 5 each side  - partial squats pushing yellow hand float under water x 10 -  straddling noodle and UE on yellow hand floats near corner cycling with LE directly underneath her  1/27  LAD in open pack position   Mulligan hip mob inf and lateral grade III; ER and flexion bias  R hip inf glide (scoop mob) grade III  Heel toe rocking 20x Stool IR/ER 20x FABER slide (unable without manual) Seated figure 4 30s 3x Sidestepping RTB at knees; fwd and retro monster walk 2x each 30-50ft 4" step up and down 3x8  05/08/23 -Aquatic therapy -Walking backward 2 laps, - side stepping 1 lap, with arm addct/abdct x  2 laps - UE On wall:  backward lunges x 10 (RLE back); hip circles CW/CCW x 10 each LE; RLE heel raises x 10 - TrA set with 1/2 hollow noodle pull down x 5-> rainbow hand floats (bilat) pull down to thighs with slow return to surface x 10 - seated on stairs, cycling -> stopped - tricep press ups from 4th step x 10 - Plank with hands on bench in water with hip ext x 10 each LE - Plank to/from superman with UE on yellow hand floats x 10 - straddling noodle and UE on yellow hand floats near corner cycling with LE directly underneath her  05/04/2023 Tall kneeling glut set- lacking 12 deg hip extension; standing -6 deg hip extension Tall kneel +  Rt GHJ flexion Tall kneeling hinge back Tall kneeling bridge Standing lunge + Rt shoulder flexion green tband resist, added heel raise with knee extended STM hip flexors  1/15  Recumbent bike Lvl 1 5 min  Standing hip flexor stretch 30s 2x  Tall kneeling shoulder ext on cable 3x10 Cable staggered stance chest press cable Sidestepping no resistance   Review of gym activity- able to do knee ext, HS curl, and calf machine, no ABD yet but able to do small range ADD machine  R hip inf glide (scoop mob) grade III   04/27/23 STG review Cable machine- tall kneel double & single arm row, straight arm press down, hinge with single arm row Tall mini lunge with single arm cable row & chest press Lateral walking with cable resistance.  STM hip flexors   PATIENT EDUCATION:  Education details: Anatomy of condition, POC, HEP, exercise form/rationale Person educated: Patient Education method: Explanation, Demonstration, Tactile cues, Verbal cues, and Handouts Education comprehension: verbalized understanding, returned demonstration, verbal cues required, tactile cues required, and needs further education  HOME EXERCISE PROGRAM:  [email or web address]  Access Code: [email or web address]   Aquatic Access Code: [email or web address] URL: [email or web address] Date: 05/13/23 Prepared by: [facility] [address] This aquatic home exercise program from MedBridge utilizes pictures from land based exercises, but has been adapted prior to lamination and issuance.    ASSESSMENT:  CLINICAL IMPRESSION: Pt is 8 wks at this time post- op. Pt able to progress SL stability as well as CKC strengthening for HEP. Pt advised on self STM management as manual therapy reduced anterior hip discomfort. Plan to continue with progress R hip strength as tolerated. Pt progressing well with therapy at this time but is still muscle endurance limited. Pt would benefit from continued skilled therapy in order  to reach goals and maximize functional R LE  strength and ROM for return to occupation, ADL, and normalized community mobility.    REHAB POTENTIAL: Good  CLINICAL DECISION MAKING: Stable/uncomplicated  EVALUATION COMPLEXITY: Low   GOALS: Goals reviewed with patient? Yes  SHORT TERM GOALS: Target date: 4 weeks 04/28/23  Pain free flexion to 90 Baseline: Goal status: achieved   2.  Demo controlled quad set with hold Baseline:  Goal status: Achieved   3.  30s sit to stand without pain or compensation Baseline:  Goal status: achieved   4.  SLS 30s without compensation Baseline:  Goal status: achieved  LONG TERM GOALS:  Able to demo step up on 6" step with level pelvis and proper form Baseline:  Goal status: INITIAL Week 8 05/26/23  2.  Will tolerate at least 3 min on elliptical, demonstrating good tolerance to repetitive weight bearing motion Baseline:  Goal status: INITIAL  Week 8 05/26/23  3.  Demonstrate proper form in at least 10 continuous lunges without increased pain Baseline:  Goal status: INITIAL  Week 12 06/23/23  4.  Demonstrate gentle, double and single foot plyometric motions with good proximal form Baseline:  Goal status: INITIAL Week 12 06/23/23      PLAN:  PT FREQUENCY: 1-2x/week  PT DURATION: 12 weeks  PLANNED INTERVENTIONS: 97164- PT Re-evaluation, 97110-Therapeutic exercises, 97530- Therapeutic activity, 97112- Neuromuscular re-education, 97535- Self Care, 16109- Manual therapy, [phone]- Gait training, [phone]- Aquatic Therapy, Patient/Family education, Balance training, Stair training, Taping, Dry Needling, Joint mobilization, Spinal mobilization, Scar mobilization, Cryotherapy, and Moist heat.  PLAN FOR NEXT SESSION: per protocol  Zebedee Iba PT, DPT 05/27/23 12:47 PM

## 2023-05-28 ENCOUNTER — Telehealth: Payer: Self-pay | Admitting: *Deleted

## 2023-05-28 NOTE — Telephone Encounter (Signed)
Per MD request RN placed call to pt with recent Guardant Reveal results being negative.  Pt educated and verbalized understanding.

## 2023-05-29 ENCOUNTER — Encounter: Payer: Self-pay | Admitting: Hematology and Oncology

## 2023-05-29 ENCOUNTER — Ambulatory Visit (HOSPITAL_BASED_OUTPATIENT_CLINIC_OR_DEPARTMENT_OTHER): Payer: Commercial Managed Care - PPO | Admitting: Physical Therapy

## 2023-05-29 ENCOUNTER — Encounter (HOSPITAL_BASED_OUTPATIENT_CLINIC_OR_DEPARTMENT_OTHER): Payer: Self-pay | Admitting: Physical Therapy

## 2023-05-29 DIAGNOSIS — R262 Difficulty in walking, not elsewhere classified: Secondary | ICD-10-CM

## 2023-05-29 DIAGNOSIS — M6281 Muscle weakness (generalized): Secondary | ICD-10-CM

## 2023-05-29 DIAGNOSIS — C50312 Malignant neoplasm of lower-inner quadrant of left female breast: Secondary | ICD-10-CM | POA: Diagnosis not present

## 2023-05-29 DIAGNOSIS — M25551 Pain in right hip: Secondary | ICD-10-CM

## 2023-05-29 DIAGNOSIS — M25651 Stiffness of right hip, not elsewhere classified: Secondary | ICD-10-CM

## 2023-05-29 NOTE — Therapy (Signed)
OUTPATIENT PHYSICAL THERAPY TREATMENT   Patient Name: Valerie [name] MRN: [id number] DOB:06-05-1970, 53 y.o., female Today's Date: 05/29/2023  END OF SESSION:  PT End of Session - 05/29/23 1656     Visit Number 14    Number of Visits 25    Date for PT Re-Evaluation 06/27/22    Authorization Type UHC  no VL    PT Start Time 1600    PT Stop Time 1656    PT Time Calculation (min) 56 min    Activity Tolerance Patient tolerated treatment well    Behavior During Therapy WFL for tasks assessed/performed            Past Medical History:  Diagnosis Date   Abnormal echocardiogram 11/28/2021   EF 60 -65%, abnormal global longitudinal strain, see cardiology OV in Epic dated 12/25/21   Breast cancer (HCC)    Cancer (HCC) 09/2020   left breast, ER+, s/p radiation therapy 12/25/20 - 01/18/21, Patient follows with Dr. Serena Croissant @ [facility].   COVID-19 11/2019   flu-like symptoms, cough, no hospitalizations   Edema    lower extremity, follows w/ [facility], Sabino Dick, [place]. See OV dated 03/24/22 in Epic.   GERD (gastroesophageal reflux disease)    Follows w/ Dr. Judeen Hammans, gastroenterologist and PCP, Dr. [facility] @ [facility].   Hashimoto's disease    Follows with Dr. Ernest Haber @ [place].   History of hiatal hernia    2 cm hiatal hernia per 01/08/22 EGD   History of radiation therapy 2022   completed 01-18-2021   [facility] (hard of hearing)    has gotten bilateral aides.   Hypothyroidism    Follows w/ endocronologist, Dr. Ernest Haber @ [place].   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 Despina Arias @ [facility].   Optic neuritis    Follows w/ opthamology, Dr. Mora Appl.03/2020 right eye (almost complete blindness), 04/2020 left eye   Personal history of radiation  therapy    PONV (postoperative nausea and vomiting)    Status post dilation of esophageal narrowing 01/08/2022   EGD, esophagus dilated   Wears glasses    Past Surgical History:  Procedure Laterality Date   ABDOMINAL HYSTERECTOMY  04/23/22   BIOPSY BREAST Left 10/17/2020   BREAST LUMPECTOMY     BREAST LUMPECTOMY WITH RADIOACTIVE SEED AND SENTINEL LYMPH NODE BIOPSY Left 11/15/2020   Procedure: LEFT BREAST LUMPECTOMY WITH RADIOACTIVE SEED AND LEFT SENTINEL LYMPH NODE BIOPSY;  Surgeon: Harriette Bouillon, MD;  Location: [facility];  Service: General;  Laterality: Left;   BREAST SURGERY  August 2022   Cancer surgery   COLONOSCOPY  09/06/2021   cecal polyp   CT CORONARY CA SCORING  07/13/2020   Coronary Calcium = 0 (in Epic)   CYSTOSCOPY N/A 04/23/2022   Procedure: CYSTOSCOPY;  Surgeon: Jerene Bears, MD;  Location: [facility];  Service: Gynecology;  Laterality: N/A;   ESOPHAGOGASTRODUODENOSCOPY  01/08/2022   2cm hiatal hernia, esophagus dilated   TOTAL LAPAROSCOPIC HYSTERECTOMY WITH SALPINGECTOMY Bilateral 04/23/2022   Procedure: TOTAL LAPAROSCOPIC HYSTERECTOMY BILATERAL SALPINGECTOMY, LEFT OOPHORECTOMY;  Surgeon: Jerene Bears, MD;  Location: [facility];  Service: Gynecology;  Laterality: Bilateral;   TRANSTHORACIC ECHOCARDIOGRAM  11/28/2021   see results in Epic  TRANSTHORACIC ECHOCARDIOGRAM  07/05/2020   in Epic   TUBAL LIGATION  02/2012   WISDOM TOOTH EXTRACTION     early 1990's   Patient Active Problem List   Diagnosis Date Noted   High risk medication use 05/06/2023   Genetic testing 07/04/2022   Family history of uterine cancer 06/24/2022   Local edema 02/25/2022   Hypothyroidism due to Hashimoto's thyroiditis 06/07/2021   Malignant neoplasm of lower-inner quadrant of left breast in female, estrogen receptor positive (HCC) 10/23/2020   Optic neuritis, left 10/18/2020   Myelin oligodendrocyte glycoprotein antibody disorder  (MOGAD) (HCC) 07/10/2020   Mitral regurgitation    Neuromyelitis optica (devic) (HCC) 05/07/2020   Hypothyroid 04/25/2020   Optic neuritis, right 04/24/2020     REFERRING PROVIDER:  Huel Cote, MD    REFERRING DIAG:  [phone] (ICD-10-CM) - Tear of right acetabular labrum, initial encounter   s/p Rt acetabular labral repair  Rationale for Evaluation and Treatment: Rehabilitation  THERAPY DIAG:  Pain in right hip  Difficulty in walking, not elsewhere classified  Stiffness of right hip, not elsewhere classified  Muscle weakness (generalized)  ONSET DATE: DOS 03/31/23   SUBJECTIVE:                                                                                                                                                                                           SUBJECTIVE STATEMENT:  Pt reports she is only able to walk 2 laps around track upstairs before she needs to stop.      PERTINENT HISTORY:  N/a  PAIN:  Are you having pain? no  NPRS: 0/10 Location:  Aggravated by: Relieved by:   PRECAUTIONS:  None  RED FLAGS: None   WEIGHT BEARING RESTRICTIONS:  Yes 2 wk TDWB  FALLS:  Has patient fallen in last 6 months? No  LIVING ENVIRONMENT: Lives with: lives with their spouse and 2 cats Lives in: House ranch with basement (washer/dryer & garage where she parks) Stairs: Yes: Internal: 12-14 steps; on left going up and External: 4 steps; on right going up Has following equipment at home: None  OCCUPATION:  works for [employer] including lifting up 25-30#, pushing & pulling   PLOF:  Independent  PATIENT GOALS:  Back to gym, improve flexibility   OBJECTIVE:  Note: Objective measures were completed at Evaluation unless otherwise noted.  PATIENT SURVEYS:  FOTO 25   GAIT: Eval: arrived with bil axillary crutches with brace, using crutches more for stability 1/13: using SPC when walking a lot  TREATMENT DATE:  05/29/23 Pt seen for aquatic therapy today.  Treatment took place in water 3.5-4.75 ft in depth at the [facility] pool. Temp of water was 91.  Pt entered/exited the pool via stairs independently with bilat rail.  -Walking forward/ backward for 10 continuous minutes with reciprocal arm swing - side stepping with arm addct/abdct with yellow hand floats x 5 laps - runners step ups with quick up/ slow coming down 2 x 5 (mindful of height of leg) - side plank on blue hand float x 30s each with attempts to raise opp arm out of water - SLS with opp arm moving noodle under water (circles/ swing front and back) - tall kneeling on big square noodle with light UE support on wall (good challenge) - 3 way LE stretch RLE, (ITB, hamstring, adductor) x 10s each, foot on hollow blue noodle - R quad and hip flexor stretch with ankle on hollow blue noodle x 30s x 2 - educated pt on self massage with stick roller; demo provided   2/12 STM R glutes and hip rotators LAD ER and flexion bias  Self foam rolling/massage for quad and R lateral hip 6" lateral step down 3x8 SLS 30s 4x (airex)  10lb KB goblet squat to table 3x8  Gym and aquatic exercise modifications  05/22/23 Pt seen for aquatic therapy today.  Treatment took place in water 3.5-4.75 ft in depth at the [facility] pool. Temp of water was 91.  Pt entered/exited the pool via stairs independently with bilat rail.  -Walking forward/ backward 4 laps with reciprocal arm swing and cues for vertical trunk - side stepping with arm addct/abdct with yellow hand floats x 2 laps - UE on hand floats:  hip circles CW/CCW x 10 each - UE on wall:  single leg clam x 10 each  - Plank to/from superman with UE on yellow hand floats x 10 - plank on barbell with hip ext x 10 each LE - side plank on barbell -> on blue hand float x 30s each -cycling while  suspended pushing bilat blue hand floats under water to engage more core - single leg superman x 5 each side  - tall kneeling on yellow noodle with light UE support on wall (good challenge) - balance standing on yellow noodle and light marching - 3 way LE stretch RLE, (ITB, hamstring, adductor) x 15s each, foot on hollow blue noodle - R quad and hip flexor stretch with ankle on hollow blue noodle x 20s   2/5 STM R glutes and hip rotators  Mulligan hip mob inf and lateral grade III; ER and flexion bias  8" box step up 4x8  SLS 30s 4x Shuttle leg press 100lbs 3x10 Standing hip flexor stretch (pain) Standing adductor stretch 30s 3x   Discussion of hip ABD/ADD machine; knee ext machine    05/13/23 Pt seen for aquatic therapy today.  Treatment took place in water 3.5-4.75 ft in depth at the [facility] pool. Temp of water was 91.  Pt entered/exited the pool via stairs independently with bilat rail.  -Walking forward/ backward 4 laps with reciprocal arm swing and cues for vertical trunk - side stepping with arm addct/abdct with rainbow hand floats -> yellow hand floats  - farmer carry with single yellow hand float under water, walking forward / backward  - UE on hand floats:  hip circles CW/CCW x 10 each;  heel/ toe raises x 10;  - UE On wall:  single leg clam  x 10 each; backward lunges x 10 (alternating LEs)  - staggered stance kick board row (11,12, 1 o'clock)  - Plank to/from superman with UE on yellow hand floats x 10 - Plank with hands on bench in water with hip ext x 10 each LE - side plank into star fish x 5 each side  - partial squats pushing yellow hand float under water x 10 -  straddling noodle and UE on yellow hand floats near corner cycling with LE directly underneath her  1/27  LAD in open pack position   Mulligan hip mob inf and lateral grade III; ER and flexion bias  R hip inf glide (scoop mob) grade III  Heel toe rocking 20x Stool IR/ER 20x FABER  slide (unable without manual) Seated figure 4 30s 3x Sidestepping RTB at knees; fwd and retro monster walk 2x each 30-103ft 4" step up and down 3x8  05/08/23 -Aquatic therapy -Walking backward 2 laps, - side stepping 1 lap, with arm addct/abdct x 2 laps - UE On wall:  backward lunges x 10 (RLE back); hip circles CW/CCW x 10 each LE; RLE heel raises x 10 - TrA set with 1/2 hollow noodle pull down x 5-> rainbow hand floats (bilat) pull down to thighs with slow return to surface x 10 - seated on stairs, cycling -> stopped - tricep press ups from 4th step x 10 - Plank with hands on bench in water with hip ext x 10 each LE - Plank to/from superman with UE on yellow hand floats x 10 - straddling noodle and UE on yellow hand floats near corner cycling with LE directly underneath her  05/04/2023 Tall kneeling glut set- lacking 12 deg hip extension; standing -6 deg hip extension Tall kneel + Rt GHJ flexion Tall kneeling hinge back Tall kneeling bridge Standing lunge + Rt shoulder flexion green tband resist, added heel raise with knee extended STM hip flexors  1/15  Recumbent bike Lvl 1 5 min  Standing hip flexor stretch 30s 2x  Tall kneeling shoulder ext on cable 3x10 Cable staggered stance chest press cable Sidestepping no resistance   Review of gym activity- able to do knee ext, HS curl, and calf machine, no ABD yet but able to do small range ADD machine  R hip inf glide (scoop mob) grade III   04/27/23 STG review Cable machine- tall kneel double & single arm row, straight arm press down, hinge with single arm row Tall mini lunge with single arm cable row & chest press Lateral walking with cable resistance.  STM hip flexors   PATIENT EDUCATION:  Education details: Anatomy of condition, POC, HEP, exercise form/rationale Person educated: Patient Education method: Explanation, Demonstration, Tactile cues, Verbal cues, and Handouts Education comprehension: verbalized understanding,  returned demonstration, verbal cues required, tactile cues required, and needs further education  HOME EXERCISE PROGRAM:  [email or web address]  Access Code: [email or web address]   Aquatic Access Code: [email or web address] URL: [email or web address] Date: 05/13/23 - updated 05/29/23 Prepared by: [facility] [address] This aquatic home exercise program from [employer] utilizes pictures from land based exercises, but has been adapted prior to lamination and issuance.    ASSESSMENT:  CLINICAL IMPRESSION: Today's focus in aquatic session was working on endurance of walking and SLS balance.  She was able to tolerate 10 continuous minutes of walking in water without pain or difficulty. Added a few more aquatics exercises to HEP.  Pt progressing well with therapy at this time but is still muscle endurance  limited. Pt would benefit from continued skilled therapy in order to reach goals and maximize functional R LE  strength and ROM for return to occupation, ADL, and normalized community mobility. Progressing well towards remaining goals. Therapist to ck LTG1 and 2 as time allows.    REHAB POTENTIAL: Good  CLINICAL DECISION MAKING: Stable/uncomplicated  EVALUATION COMPLEXITY: Low   GOALS: Goals reviewed with patient? Yes  SHORT TERM GOALS: Target date: 4 weeks 04/28/23  Pain free flexion to 90 Baseline: Goal status: achieved   2.  Demo controlled quad set with hold Baseline:  Goal status: Achieved   3.  30s sit to stand without pain or compensation Baseline:  Goal status: achieved   4.  SLS 30s without compensation Baseline:  Goal status: achieved  LONG TERM GOALS:  Able to demo step up on 6" step with level pelvis and proper form Baseline:  Goal status: INITIAL Week 8 05/26/23  2.  Will tolerate at least 3 min on elliptical, demonstrating good tolerance to repetitive weight bearing motion Baseline:  Goal status: INITIAL  Week 8 05/26/23  3.  Demonstrate proper  form in at least 10 continuous lunges without increased pain Baseline:  Goal status: INITIAL  Week 12 06/23/23  4.  Demonstrate gentle, double and single foot plyometric motions with good proximal form Baseline:  Goal status: INITIAL Week 12 06/23/23      PLAN:  PT FREQUENCY: 1-2x/week  PT DURATION: 12 weeks  PLANNED INTERVENTIONS: 97164- PT Re-evaluation, 97110-Therapeutic exercises, 97530- Therapeutic activity, 97112- Neuromuscular re-education, 97535- Self Care, 19147- Manual therapy, [phone]- Gait training, [phone]- Aquatic Therapy, Patient/Family education, Balance training, Stair training, Taping, Dry Needling, Joint mobilization, Spinal mobilization, Scar mobilization, Cryotherapy, and Moist heat.  PLAN FOR NEXT SESSION: per protocol  Mayer Camel, PTA 05/29/23 5:02 PM [facility] [address] [place], [place], [address] Phone: [phone]   Fax:  [phone]

## 2023-05-30 ENCOUNTER — Encounter (HOSPITAL_BASED_OUTPATIENT_CLINIC_OR_DEPARTMENT_OTHER): Payer: Self-pay | Admitting: Physical Therapy

## 2023-05-30 LAB — ESTRADIOL, ULTRA SENS: Estradiol, Sensitive: 38.2 pg/mL

## 2023-06-03 ENCOUNTER — Encounter (HOSPITAL_BASED_OUTPATIENT_CLINIC_OR_DEPARTMENT_OTHER): Payer: Self-pay | Admitting: Physical Therapy

## 2023-06-03 ENCOUNTER — Ambulatory Visit (HOSPITAL_BASED_OUTPATIENT_CLINIC_OR_DEPARTMENT_OTHER): Payer: Commercial Managed Care - PPO | Admitting: Physical Therapy

## 2023-06-03 DIAGNOSIS — C50312 Malignant neoplasm of lower-inner quadrant of left female breast: Secondary | ICD-10-CM | POA: Diagnosis not present

## 2023-06-03 DIAGNOSIS — M25651 Stiffness of right hip, not elsewhere classified: Secondary | ICD-10-CM

## 2023-06-03 DIAGNOSIS — M6281 Muscle weakness (generalized): Secondary | ICD-10-CM

## 2023-06-03 DIAGNOSIS — M25551 Pain in right hip: Secondary | ICD-10-CM

## 2023-06-03 DIAGNOSIS — R262 Difficulty in walking, not elsewhere classified: Secondary | ICD-10-CM

## 2023-06-03 NOTE — Therapy (Signed)
OUTPATIENT PHYSICAL THERAPY TREATMENT   Patient Name: Valerie [name] MRN: [id number] DOB:[birth year], 53 y.o., female Today's Date: 06/03/2023  END OF SESSION:  PT End of Session - 06/03/23 1038     Visit Number 15    Number of Visits 25    Date for PT Re-Evaluation 06/27/22    Authorization Type UHC  no VL    PT Start Time 1015    PT Stop Time 1048    PT Time Calculation (min) 33 min    Activity Tolerance Patient tolerated treatment well    Behavior During Therapy WFL for tasks assessed/performed             Past Medical History:  Diagnosis Date   Abnormal echocardiogram 11/28/2021   EF 60 -65%, abnormal global longitudinal strain, see cardiology OV in Epic dated 12/25/21   Breast cancer (HCC)    Cancer (HCC) 09/2020   left breast, ER+, s/p radiation therapy 12/25/20 - 01/18/21, Patient follows with Dr. Serena Croissant @ [facility].   COVID-19 11/2019   flu-like symptoms, cough, no hospitalizations   Edema    lower extremity, follows w/ [facility], Sabino Dick, [place]. See OV dated 03/24/22 in Epic.   GERD (gastroesophageal reflux disease)    Follows w/ Dr. Judeen Hammans, gastroenterologist and PCP, Dr. Maryruth Hancock [facility].   Hashimoto's disease    Follows with Dr. Ernest Haber @ [place].   History of hiatal hernia    2 cm hiatal hernia per 01/08/22 EGD   History of radiation therapy 2022   completed 01-18-2021   [facility] (hard of hearing)    has gotten bilateral aides.   Hypothyroidism    Follows w/ endocronologist, Dr. Ernest Haber @ [place].   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 Despina Arias @ [facility].   Optic neuritis    Follows w/ opthamology, Dr. Mora Appl.03/2020 right eye (almost complete blindness), 04/2020 left eye   Personal history of radiation  therapy    PONV (postoperative nausea and vomiting)    Status post dilation of esophageal narrowing 01/08/2022   EGD, esophagus dilated   Wears glasses    Past Surgical History:  Procedure Laterality Date   ABDOMINAL HYSTERECTOMY  04/23/22   BIOPSY BREAST Left 10/17/2020   BREAST LUMPECTOMY     BREAST LUMPECTOMY WITH RADIOACTIVE SEED AND SENTINEL LYMPH NODE BIOPSY Left 11/15/2020   Procedure: LEFT BREAST LUMPECTOMY WITH RADIOACTIVE SEED AND LEFT SENTINEL LYMPH NODE BIOPSY;  Surgeon: Harriette Bouillon, MD;  Location: [facility];  Service: General;  Laterality: Left;   BREAST SURGERY  August 2022   Cancer surgery   COLONOSCOPY  09/06/2021   cecal polyp   CT CORONARY CA SCORING  07/13/2020   Coronary Calcium = 0 (in Epic)   CYSTOSCOPY N/A 04/23/2022   Procedure: CYSTOSCOPY;  Surgeon: Jerene Bears, MD;  Location[facility];  Service: Gynecology;  Laterality: N/A;   ESOPHAGOGASTRODUODENOSCOPY  01/08/2022   2cm hiatal hernia, esophagus dilated   TOTAL LAPAROSCOPIC HYSTERECTOMY WITH SALPINGECTOMY Bilateral 04/23/2022   Procedure: TOTAL LAPAROSCOPIC HYSTERECTOMY BILATERAL SALPINGECTOMY, LEFT OOPHORECTOMY;  Surgeon: Jerene Bears, MD;  Location: [facility];  Service: Gynecology;  Laterality: Bilateral;   TRANSTHORACIC ECHOCARDIOGRAM  11/28/2021   see results in Epic  TRANSTHORACIC ECHOCARDIOGRAM  07/05/2020   in Epic   TUBAL LIGATION  02/2012   WISDOM TOOTH EXTRACTION     early 1990's   Patient Active Problem List   Diagnosis Date Noted   High risk medication use 05/06/2023   Genetic testing 07/04/2022   Family history of uterine cancer 06/24/2022   Local edema 02/25/2022   Hypothyroidism due to Hashimoto's thyroiditis 06/07/2021   Malignant neoplasm of lower-inner quadrant of left breast in female, estrogen receptor positive (HCC) 10/23/2020   Optic neuritis, left 10/18/2020   Myelin oligodendrocyte glycoprotein antibody disorder  (MOGAD) (HCC) 07/10/2020   Mitral regurgitation    Neuromyelitis optica (devic) (HCC) 05/07/2020   Hypothyroid 04/25/2020   Optic neuritis, right 04/24/2020     REFERRING PROVIDER:  Huel Cote, MD    REFERRING DIAG:  [phone] (ICD-10-CM) - Tear of right acetabular labrum, initial encounter   s/p Rt acetabular labral repair  Rationale for Evaluation and Treatment: Rehabilitation  THERAPY DIAG:  Pain in right hip  Difficulty in walking, not elsewhere classified  Stiffness of right hip, not elsewhere classified  Muscle weakness (generalized)  ONSET DATE: DOS 03/31/23   SUBJECTIVE:                                                                                                                                                                                           SUBJECTIVE STATEMENT:  Pt states she was able to do 3 laps upstairs this time around before needing to stop. No pain since last session  PERTINENT HISTORY:  N/a  PAIN:  Are you having pain? no  NPRS: 0/10 Location:  Aggravated by: Relieved by:   PRECAUTIONS:  None  RED FLAGS: None   WEIGHT BEARING RESTRICTIONS:  Yes 2 wk TDWB  FALLS:  Has patient fallen in last 6 months? No  LIVING ENVIRONMENT: Lives with: lives with their spouse and 2 cats Lives in: House ranch with basement (washer/dryer & garage where she parks) Stairs: Yes: Internal: 12-14 steps; on left going up and External: 4 steps; on right going up Has following equipment at home: None  OCCUPATION:  works for [employer] including lifting up 25-30#, pushing & pulling   PLOF:  Independent  PATIENT GOALS:  Back to gym, improve flexibility   OBJECTIVE:  Note: Objective measures were completed at Evaluation unless otherwise noted.  PATIENT SURVEYS:  FOTO 25   GAIT: Eval: arrived with bil axillary crutches with brace, using crutches more for stability 1/13: using SPC when walking a lot  TREATMENT DATE:   2/19  Elliptical warm up 3 min lvl 1 (stopped due to fatigue) 6" step up and down 3x8  Shuttle SL leg press 75lbs 3x8 SLS on airex 30s 4x GTB paloff 3x10 ea Supine 90/90 3s 2x10 Figure 4 bridge 3x8 Kneeling plank 4x 10s  05/29/23 Pt seen for aquatic therapy today.  Treatment took place in water 3.5-4.75 ft in depth at the [facility] pool. Temp of water was 91.  Pt entered/exited the pool via stairs independently with bilat rail.  -Walking forward/ backward for 10 continuous minutes with reciprocal arm swing - side stepping with arm addct/abdct with yellow hand floats x 5 laps - runners step ups with quick up/ slow coming down 2 x 5 (mindful of height of leg) - side plank on blue hand float x 30s each with attempts to raise opp arm out of water - SLS with opp arm moving noodle under water (circles/ swing front and back) - tall kneeling on big square noodle with light UE support on wall (good challenge) - 3 way LE stretch RLE, (ITB, hamstring, adductor) x 10s each, foot on hollow blue noodle - R quad and hip flexor stretch with ankle on hollow blue noodle x 30s x 2 - educated pt on self massage with stick roller; demo provided   2/12 STM R glutes and hip rotators LAD ER and flexion bias  Self foam rolling/massage for quad and R lateral hip 6" lateral step down 3x8 SLS 30s 4x (airex)  10lb KB goblet squat to table 3x8  Gym and aquatic exercise modifications  05/22/23 Pt seen for aquatic therapy today.  Treatment took place in water 3.5-4.75 ft in depth at the [facility] pool. Temp of water was 91.  Pt entered/exited the pool via stairs independently with bilat rail.  -Walking forward/ backward 4 laps with reciprocal arm swing and cues for vertical trunk - side stepping with arm addct/abdct with yellow hand floats x 2 laps - UE on hand floats:  hip  circles CW/CCW x 10 each - UE on wall:  single leg clam x 10 each  - Plank to/from superman with UE on yellow hand floats x 10 - plank on barbell with hip ext x 10 each LE - side plank on barbell -> on blue hand float x 30s each -cycling while suspended pushing bilat blue hand floats under water to engage more core - single leg superman x 5 each side  - tall kneeling on yellow noodle with light UE support on wall (good challenge) - balance standing on yellow noodle and light marching - 3 way LE stretch RLE, (ITB, hamstring, adductor) x 15s each, foot on hollow blue noodle - R quad and hip flexor stretch with ankle on hollow blue noodle x 20s   2/5 STM R glutes and hip rotators  Mulligan hip mob inf and lateral grade III; ER and flexion bias  8" box step up 4x8  SLS 30s 4x Shuttle leg press 100lbs 3x10 Standing hip flexor stretch (pain) Standing adductor stretch 30s 3x   Discussion of hip ABD/ADD machine; knee ext machine    05/13/23 Pt seen for aquatic therapy today.  Treatment took place in water 3.5-4.75 ft in depth at the [facility] pool. Temp of water was 91.  Pt entered/exited the pool via stairs independently with bilat rail.  -Walking forward/ backward 4 laps with reciprocal arm swing and cues for vertical trunk - side stepping with arm addct/abdct with rainbow  hand floats -> yellow hand floats  - farmer carry with single yellow hand float under water, walking forward / backward  - UE on hand floats:  hip circles CW/CCW x 10 each;  heel/ toe raises x 10;  - UE On wall:  single leg clam x 10 each; backward lunges x 10 (alternating LEs)  - staggered stance kick board row (11,12, 1 o'clock)  - Plank to/from superman with UE on yellow hand floats x 10 - Plank with hands on bench in water with hip ext x 10 each LE - side plank into star fish x 5 each side  - partial squats pushing yellow hand float under water x 10 -  straddling noodle and UE on yellow hand floats  near corner cycling with LE directly underneath her  1/27  LAD in open pack position   Mulligan hip mob inf and lateral grade III; ER and flexion bias  R hip inf glide (scoop mob) grade III  Heel toe rocking 20x Stool IR/ER 20x FABER slide (unable without manual) Seated figure 4 30s 3x Sidestepping RTB at knees; fwd and retro monster walk 2x each 30-40ft 4" step up and down 3x8  05/08/23 -Aquatic therapy -Walking backward 2 laps, - side stepping 1 lap, with arm addct/abdct x 2 laps - UE On wall:  backward lunges x 10 (RLE back); hip circles CW/CCW x 10 each LE; RLE heel raises x 10 - TrA set with 1/2 hollow noodle pull down x 5-> rainbow hand floats (bilat) pull down to thighs with slow return to surface x 10 - seated on stairs, cycling -> stopped - tricep press ups from 4th step x 10 - Plank with hands on bench in water with hip ext x 10 each LE - Plank to/from superman with UE on yellow hand floats x 10 - straddling noodle and UE on yellow hand floats near corner cycling with LE directly underneath her  05/04/2023 Tall kneeling glut set- lacking 12 deg hip extension; standing -6 deg hip extension Tall kneel + Rt GHJ flexion Tall kneeling hinge back Tall kneeling bridge Standing lunge + Rt shoulder flexion green tband resist, added heel raise with knee extended STM hip flexors  1/15  Recumbent bike Lvl 1 5 min  Standing hip flexor stretch 30s 2x  Tall kneeling shoulder ext on cable 3x10 Cable staggered stance chest press cable Sidestepping no resistance   Review of gym activity- able to do knee ext, HS curl, and calf machine, no ABD yet but able to do small range ADD machine  R hip inf glide (scoop mob) grade III   04/27/23 STG review Cable machine- tall kneel double & single arm row, straight arm press down, hinge with single arm row Tall mini lunge with single arm cable row & chest press Lateral walking with cable resistance.  STM hip flexors   PATIENT  EDUCATION:  Education details: Anatomy of condition, POC, HEP, exercise form/rationale Person educated: Patient Education method: Explanation, Demonstration, Tactile cues, Verbal cues, and Handouts Education comprehension: verbalized understanding, returned demonstration, verbal cues required, tactile cues required, and needs further education  HOME EXERCISE PROGRAM:  [email or web address]  Access Code: [email or web address]   Aquatic Access Code: [email or web address] URL: [email or web address] Date: 05/13/23 - updated 05/29/23 Prepared by: [facility] [address] This aquatic home exercise program from MedBridge utilizes pictures from land based exercises, but has been adapted prior to lamination and issuance.    ASSESSMENT:  CLINICAL IMPRESSION: Pt able to progress  land based strength, stability, and motor control of L LE with only fatigue into the R hip. No pain noted though more aggressive adduction and IR stretch caused tightness into the R lateral hip. HEP updated today to include plank position holds and SL hip strength. Plan to continue with land and pool visits for the next 2 wks and then pt likely able to drop to 1x/week for more independent strengthening in the gym. Pt would benefit from continued skilled therapy in order to reach goals and maximize functional R LE  strength and ROM for return to occupation, ADL, and normalized community mobility. Goals progressing towards being able to perform plyos once she test at 80% limb symmetry with HHD.   REHAB POTENTIAL: Good  CLINICAL DECISION MAKING: Stable/uncomplicated  EVALUATION COMPLEXITY: Low   GOALS: Goals reviewed with patient? Yes  SHORT TERM GOALS: Target date: 4 weeks 04/28/23  Pain free flexion to 90 Baseline: Goal status: achieved   2.  Demo controlled quad set with hold Baseline:  Goal status: Achieved   3.  30s sit to stand without pain or compensation Baseline:  Goal status: achieved   4.   SLS 30s without compensation Baseline:  Goal status: achieved  LONG TERM GOALS:  Able to demo step up on 6" step with level pelvis and proper form Baseline:  Goal status: MET  06/03/23  2.  Will tolerate at least 3 min on elliptical, demonstrating good tolerance to repetitive weight bearing motion Baseline:  Goal status: IMET  3.  Demonstrate proper form in at least 10 continuous lunges without increased pain Baseline:  Goal status: ongoing  Week 12 06/23/23  4.  Demonstrate gentle, double and single foot plyometric motions with good proximal form Baseline:  Goal status: ongoing Week 12 06/23/23      PLAN:  PT FREQUENCY: 1-2x/week  PT DURATION: 12 weeks  PLANNED INTERVENTIONS: 97164- PT Re-evaluation, 97110-Therapeutic exercises, 97530- Therapeutic activity, 97112- Neuromuscular re-education, 97535- Self Care, 16109- Manual therapy, [phone]- Gait training, [phone]- Aquatic Therapy, Patient/Family education, Balance training, Stair training, Taping, Dry Needling, Joint mobilization, Spinal mobilization, Scar mobilization, Cryotherapy, and Moist heat.  PLAN FOR NEXT SESSION: per protocol  Zebedee Iba PT, DPT 06/03/23 10:59 AM

## 2023-06-05 ENCOUNTER — Encounter (HOSPITAL_BASED_OUTPATIENT_CLINIC_OR_DEPARTMENT_OTHER): Payer: Self-pay | Admitting: Physical Therapy

## 2023-06-05 ENCOUNTER — Ambulatory Visit (HOSPITAL_BASED_OUTPATIENT_CLINIC_OR_DEPARTMENT_OTHER): Payer: Commercial Managed Care - PPO | Admitting: Physical Therapy

## 2023-06-05 DIAGNOSIS — M6281 Muscle weakness (generalized): Secondary | ICD-10-CM

## 2023-06-05 DIAGNOSIS — C50312 Malignant neoplasm of lower-inner quadrant of left female breast: Secondary | ICD-10-CM | POA: Diagnosis not present

## 2023-06-05 DIAGNOSIS — R262 Difficulty in walking, not elsewhere classified: Secondary | ICD-10-CM

## 2023-06-05 DIAGNOSIS — M25551 Pain in right hip: Secondary | ICD-10-CM

## 2023-06-05 NOTE — Therapy (Signed)
OUTPATIENT PHYSICAL THERAPY TREATMENT   Patient Name: Valerie [name] MRN: [id number] DOB:1970-12-18, 53 y.o., female Today's Date: 06/05/2023  END OF SESSION:  PT End of Session - 06/05/23 0820     Visit Number 16    Number of Visits 25    Date for PT Re-Evaluation 06/27/22    Authorization Type UHC  no VL    PT Start Time 0715    PT Stop Time 0758    PT Time Calculation (min) 43 min    Activity Tolerance Patient tolerated treatment well    Behavior During Therapy [facility] for tasks assessed/performed              Past Medical History:  Diagnosis Date   Abnormal echocardiogram 11/28/2021   EF 60 -65%, abnormal global longitudinal strain, see cardiology OV in Epic dated 12/25/21   Breast cancer (HCC)    Cancer (HCC) 09/2020   left breast, ER+, s/p radiation therapy 12/25/20 - 01/18/21, Patient follows with Dr. Serena Croissant @ [facility].   COVID-19 11/2019   flu-like symptoms, cough, no hospitalizations   Edema    lower extremity, follows w/ [facility], Sabino Dick, [place]. See OV dated 03/24/22 in Epic.   GERD (gastroesophageal reflux disease)    Follows w/ Dr. Judeen Hammans, gastroenterologist and PCP, Dr. Maryruth Hancock [facility] @ [facility].   Hashimoto's disease    Follows with Dr. Ernest Haber @ [place].   History of hiatal hernia    2 cm hiatal hernia per 01/08/22 EGD   History of radiation therapy 2022   completed 01-18-2021   [facility] (hard of hearing)    has gotten bilateral aides.   Hypothyroidism    Follows w/ endocronologist, Dr. Ernest Haber @ .   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 Despina Arias @ [facility].   Optic neuritis    Follows w/ opthamology, Dr. Mora Appl.03/2020 right eye (almost complete blindness), 04/2020 left eye   Personal history of radiation  therapy    PONV (postoperative nausea and vomiting)    Status post dilation of esophageal narrowing 01/08/2022   EGD, esophagus dilated   Wears glasses    Past Surgical History:  Procedure Laterality Date   ABDOMINAL HYSTERECTOMY  04/23/22   BIOPSY BREAST Left 10/17/2020   BREAST LUMPECTOMY     BREAST LUMPECTOMY WITH RADIOACTIVE SEED AND SENTINEL LYMPH NODE BIOPSY Left 11/15/2020   Procedure: LEFT BREAST LUMPECTOMY WITH RADIOACTIVE SEED AND LEFT SENTINEL LYMPH NODE BIOPSY;  Surgeon: Harriette Bouillon, MD;  Location: [facility];  Service: General;  Laterality: Left;   BREAST SURGERY  August 2022   Cancer surgery   COLONOSCOPY  09/06/2021   cecal polyp   CT CORONARY CA SCORING  07/13/2020   Coronary Calcium = 0 (in Epic)   CYSTOSCOPY N/A 04/23/2022   Procedure: CYSTOSCOPY;  Surgeon: Jerene Bears, MD;  Location: [facility];  Service: Gynecology;  Laterality: N/A;   ESOPHAGOGASTRODUODENOSCOPY  01/08/2022   2cm hiatal hernia, esophagus dilated   TOTAL LAPAROSCOPIC HYSTERECTOMY WITH SALPINGECTOMY Bilateral 04/23/2022   Procedure: TOTAL LAPAROSCOPIC HYSTERECTOMY BILATERAL SALPINGECTOMY, LEFT OOPHORECTOMY;  Surgeon: Jerene Bears, MD;  Location: [facility];  Service: Gynecology;  Laterality: Bilateral;   TRANSTHORACIC ECHOCARDIOGRAM  11/28/2021   see results in  Epic   TRANSTHORACIC ECHOCARDIOGRAM  07/05/2020   in Epic   TUBAL LIGATION  02/2012   WISDOM TOOTH EXTRACTION     early 1990's   Patient Active Problem List   Diagnosis Date Noted   High risk medication use 05/06/2023   Genetic testing 07/04/2022   Family history of uterine cancer 06/24/2022   Local edema 02/25/2022   Hypothyroidism due to Hashimoto's thyroiditis 06/07/2021   Malignant neoplasm of lower-inner quadrant of left breast in female, estrogen receptor positive (HCC) 10/23/2020   Optic neuritis, left 10/18/2020   Myelin oligodendrocyte glycoprotein antibody disorder  (MOGAD) (HCC) 07/10/2020   Mitral regurgitation    Neuromyelitis optica (devic) (HCC) 05/07/2020   Hypothyroid 04/25/2020   Optic neuritis, right 04/24/2020     REFERRING PROVIDER:  Huel Cote, MD    REFERRING DIAG:  [phone] (ICD-10-CM) - Tear of right acetabular labrum, initial encounter   s/p Rt acetabular labral repair  Rationale for Evaluation and Treatment: Rehabilitation  THERAPY DIAG:  Difficulty in walking, not elsewhere classified  Muscle weakness (generalized)  Pain in right hip  ONSET DATE: DOS 03/31/23   SUBJECTIVE:                                                                                                                                                                                           SUBJECTIVE STATEMENT: "Pt reports some fatigue/stiffness in hip with bike and elliptical but no pain.  Did have some clicking in hip with initial few minutes on bike which stopped by the time I was done (5 min)."  PERTINENT HISTORY:  N/a  PAIN:  Are you having pain? no  NPRS: 0/10 Location:  Aggravated by: Relieved by:   PRECAUTIONS:  None  RED FLAGS: None   WEIGHT BEARING RESTRICTIONS:  Yes 2 wk TDWB  FALLS:  Has patient fallen in last 6 months? No  LIVING ENVIRONMENT: Lives with: lives with their spouse and 2 cats Lives in: House ranch with basement (washer/dryer & garage where she parks) Stairs: Yes: Internal: 12-14 steps; on left going up and External: 4 steps; on right going up Has following equipment at home: None  OCCUPATION:  works for [employer] including lifting up 25-30#, pushing & pulling   PLOF:  Independent  PATIENT GOALS:  Back to gym, improve flexibility   OBJECTIVE:  Note: Objective measures were completed at Evaluation unless otherwise noted.  PATIENT SURVEYS:  FOTO 25   GAIT: Eval: arrived with bil axillary crutches with brace, using crutches more for stability 1/13: using SPC when walking a lot  TREATMENT DATE:  06/05/23 Pt seen for aquatic therapy today.  Treatment took place in water 3.5-4.75 ft in depth at the [facility] pool. Temp of water was 91.  Pt entered/exited the pool via stairs independently with bilat rail.  -Walking forward/ backward for 10 continuous minutes with reciprocal arm swing Side stepping then side skipping -side lunge with arm addct/abdct with yellow hand floats x 6 - 3 way LE stretch RLE, (ITB, hamstring, adductor) x 10s each, foot on solid noodle - runners step ups with quick up/ slow coming down 2 x 8 (mindful of height of leg) R/L - SLS with opp arm moving noodle under water (circles/ swing front and back)->green hand bell (good challenge) - side plank on blue hand float x 30s each with hip abd 2 x 5 R/L (excellent execution) added top ue HB for improved control -plank with leg lifts 2 x 5. -STS from 3rd from bottom step with vc and demonstration for control.  2/19  Elliptical warm up 3 min lvl 1 (stopped due to fatigue) 6" step up and down 3x8  Shuttle SL leg press 75lbs 3x8 SLS on airex 30s 4x GTB paloff 3x10 ea Supine 90/90 3s 2x10 Figure 4 bridge 3x8 Kneeling plank 4x 10s  05/29/23 Pt seen for aquatic therapy today.  Treatment took place in water 3.5-4.75 ft in depth at the [facility] pool. Temp of water was 91.  Pt entered/exited the pool via stairs independently with bilat rail.  -Walking forward/ backward for 10 continuous minutes with reciprocal arm swing - side stepping with arm addct/abdct with yellow hand floats x 5 laps - runners step ups with quick up/ slow coming down 2 x 5 (mindful of height of leg) - side plank on blue hand float x 30s each with attempts to raise opp arm out of water - SLS with opp arm moving noodle under water (circles/ swing front and back) - tall kneeling on big square noodle  with light UE support on wall (good challenge) - 3 way LE stretch RLE, (ITB, hamstring, adductor) x 10s each, foot on hollow blue noodle - R quad and hip flexor stretch with ankle on hollow blue noodle x 30s x 2 - educated pt on self massage with stick roller; demo provided   2/12 STM R glutes and hip rotators LAD ER and flexion bias  Self foam rolling/massage for quad and R lateral hip 6" lateral step down 3x8 SLS 30s 4x (airex)  10lb KB goblet squat to table 3x8  Gym and aquatic exercise modifications  05/22/23 Pt seen for aquatic therapy today.  Treatment took place in water 3.5-4.75 ft in depth at the [facility] pool. Temp of water was 91.  Pt entered/exited the pool via stairs independently with bilat rail.  -Walking forward/ backward 4 laps with reciprocal arm swing and cues for vertical trunk - side stepping with arm addct/abdct with yellow hand floats x 2 laps - UE on hand floats:  hip circles CW/CCW x 10 each - UE on wall:  single leg clam x 10 each  - Plank to/from superman with UE on yellow hand floats x 10 - plank on barbell with hip ext x 10 each LE - side plank on barbell -> on blue hand float x 30s each -cycling while suspended pushing bilat blue hand floats under water to engage more core - single leg superman x 5 each side  - tall kneeling on yellow noodle with light UE support on wall (  good challenge) - balance standing on yellow noodle and light marching - 3 way LE stretch RLE, (ITB, hamstring, adductor) x 15s each, foot on hollow blue noodle - R quad and hip flexor stretch with ankle on hollow blue noodle x 20s   2/5 STM R glutes and hip rotators  Mulligan hip mob inf and lateral grade III; ER and flexion bias  8" box step up 4x8  SLS 30s 4x Shuttle leg press 100lbs 3x10 Standing hip flexor stretch (pain) Standing adductor stretch 30s 3x   Discussion of hip ABD/ADD machine; knee ext machine    05/13/23 Pt seen for aquatic therapy today.   Treatment took place in water 3.5-4.75 ft in depth at the [facility] pool. Temp of water was 91.  Pt entered/exited the pool via stairs independently with bilat rail.  -Walking forward/ backward 4 laps with reciprocal arm swing and cues for vertical trunk - side stepping with arm addct/abdct with rainbow hand floats -> yellow hand floats  - farmer carry with single yellow hand float under water, walking forward / backward  - UE on hand floats:  hip circles CW/CCW x 10 each;  heel/ toe raises x 10;  - UE On wall:  single leg clam x 10 each; backward lunges x 10 (alternating LEs)  - staggered stance kick board row (11,12, 1 o'clock)  - Plank to/from superman with UE on yellow hand floats x 10 - Plank with hands on bench in water with hip ext x 10 each LE - side plank into star fish x 5 each side  - partial squats pushing yellow hand float under water x 10 -  straddling noodle and UE on yellow hand floats near corner cycling with LE directly underneath her  1/27  LAD in open pack position   Mulligan hip mob inf and lateral grade III; ER and flexion bias  R hip inf glide (scoop mob) grade III  Heel toe rocking 20x Stool IR/ER 20x FABER slide (unable without manual) Seated figure 4 30s 3x Sidestepping RTB at knees; fwd and retro monster walk 2x each 30-32ft 4" step up and down 3x8  05/08/23 -Aquatic therapy -Walking backward 2 laps, - side stepping 1 lap, with arm addct/abdct x 2 laps - UE On wall:  backward lunges x 10 (RLE back); hip circles CW/CCW x 10 each LE; RLE heel raises x 10 - TrA set with 1/2 hollow noodle pull down x 5-> rainbow hand floats (bilat) pull down to thighs with slow return to surface x 10 - seated on stairs, cycling -> stopped - tricep press ups from 4th step x 10 - Plank with hands on bench in water with hip ext x 10 each LE - Plank to/from superman with UE on yellow hand floats x 10 - straddling noodle and UE on yellow hand floats near corner  cycling with LE directly underneath her  05/04/2023 Tall kneeling glut set- lacking 12 deg hip extension; standing -6 deg hip extension Tall kneel + Rt GHJ flexion Tall kneeling hinge back Tall kneeling bridge Standing lunge + Rt shoulder flexion green tband resist, added heel raise with knee extended STM hip flexors  1/15  Recumbent bike Lvl 1 5 min  Standing hip flexor stretch 30s 2x  Tall kneeling shoulder ext on cable 3x10 Cable staggered stance chest press cable Sidestepping no resistance   Review of gym activity- able to do knee ext, HS curl, and calf machine, no ABD yet but able to do  small range ADD machine  R hip inf glide (scoop mob) grade III   04/27/23 STG review Cable machine- tall kneel double & single arm row, straight arm press down, hinge with single arm row Tall mini lunge with single arm cable row & chest press Lateral walking with cable resistance.  STM hip flexors   PATIENT EDUCATION:  Education details: Anatomy of condition, POC, HEP, exercise form/rationale Person educated: Patient Education method: Explanation, Demonstration, Tactile cues, Verbal cues, and Handouts Education comprehension: verbalized understanding, returned demonstration, verbal cues required, tactile cues required, and needs further education  HOME EXERCISE PROGRAM:  [email or web address]  Access Code: [email or web address]   Aquatic Access Code: [email or web address] URL: [email or web address] Date: 05/13/23 - updated 05/29/23 Prepared by: [facility] This aquatic home exercise program from [employer] utilizes pictures from land based exercises, but has been adapted prior to lamination and issuance.    ASSESSMENT:  CLINICAL IMPRESSION: Good progression with le and core stability.  She demonstrates excellent core strength. No pain sensitivity throughout session, few rest periods needed.  She does report compliance with daily exercise with some concerns  over her limitations mostly due to lack of endurance vs hip pain/fatigue.  Progressing well/goals ongoing    REHAB POTENTIAL: Good  CLINICAL DECISION MAKING: Stable/uncomplicated  EVALUATION COMPLEXITY: Low   GOALS: Goals reviewed with patient? Yes  SHORT TERM GOALS: Target date: 4 weeks 04/28/23  Pain free flexion to 90 Baseline: Goal status: achieved   2.  Demo controlled quad set with hold Baseline:  Goal status: Achieved   3.  30s sit to stand without pain or compensation Baseline:  Goal status: achieved   4.  SLS 30s without compensation Baseline:  Goal status: achieved  LONG TERM GOALS:  Able to demo step up on 6" step with level pelvis and proper form Baseline:  Goal status: MET  06/03/23  2.  Will tolerate at least 3 min on elliptical, demonstrating good tolerance to repetitive weight bearing motion Baseline:  Goal status: IMET  3.  Demonstrate proper form in at least 10 continuous lunges without increased pain Baseline:  Goal status: ongoing  Week 12 06/23/23  4.  Demonstrate gentle, double and single foot plyometric motions with good proximal form Baseline:  Goal status: ongoing Week 12 06/23/23      PLAN:  PT FREQUENCY: 1-2x/week  PT DURATION: 12 weeks  PLANNED INTERVENTIONS: 97164- PT Re-evaluation, 97110-Therapeutic exercises, 97530- Therapeutic activity, 97112- Neuromuscular re-education, 97535- Self Care, 16109- Manual therapy, [phone]- Gait training, [phone]- Aquatic Therapy, Patient/Family education, Balance training, Stair training, Taping, Dry Needling, Joint mobilization, Spinal mobilization, Scar mobilization, Cryotherapy, and Moist heat.  PLAN FOR NEXT SESSION: per protocol  Rushie Chestnut) Callen Vancuren MPT 06/05/23 9:05 AM [facility] [address] [place], [place], [address] Phone: [phone]   Fax:  [phone]

## 2023-06-08 ENCOUNTER — Encounter (HOSPITAL_BASED_OUTPATIENT_CLINIC_OR_DEPARTMENT_OTHER): Payer: Self-pay | Admitting: Physical Therapy

## 2023-06-08 ENCOUNTER — Ambulatory Visit (HOSPITAL_BASED_OUTPATIENT_CLINIC_OR_DEPARTMENT_OTHER): Payer: Commercial Managed Care - PPO | Admitting: Physical Therapy

## 2023-06-08 DIAGNOSIS — M6281 Muscle weakness (generalized): Secondary | ICD-10-CM

## 2023-06-08 DIAGNOSIS — M25551 Pain in right hip: Secondary | ICD-10-CM

## 2023-06-08 DIAGNOSIS — C50312 Malignant neoplasm of lower-inner quadrant of left female breast: Secondary | ICD-10-CM | POA: Diagnosis not present

## 2023-06-08 DIAGNOSIS — R262 Difficulty in walking, not elsewhere classified: Secondary | ICD-10-CM

## 2023-06-08 NOTE — Therapy (Signed)
 OUTPATIENT PHYSICAL THERAPY TREATMENT   Patient Name: Valerie [name] MRN: [id number] DOB:08/19/1970, 53 y.o., female Today's Date: 06/08/2023  END OF SESSION:  PT End of Session - 06/08/23 1531     Visit Number 17    Number of Visits 25    Date for PT Re-Evaluation 06/27/22    Authorization Type UHC  no VL    PT Start Time 1530    PT Stop Time 1614    PT Time Calculation (min) 44 min    Activity Tolerance Patient tolerated treatment well    Behavior During Therapy WFL for tasks assessed/performed               Past Medical History:  Diagnosis Date   Abnormal echocardiogram 11/28/2021   EF 60 -65%, abnormal global longitudinal strain, see cardiology OV in Epic dated 12/25/21   Breast cancer (HCC)    Cancer (HCC) 09/2020   left breast, ER+, s/p radiation therapy 12/25/20 - 01/18/21, Patient follows with Dr. Serena Croissant @ [facility].   COVID-19 11/2019   flu-like symptoms, cough, no hospitalizations   Edema    lower extremity, follows w/ [facility], Sabino Dick, [place]. See OV dated 03/24/22 in Epic.   GERD (gastroesophageal reflux disease)    Follows w/ Dr. Judeen Hammans, gastroenterologist and PCP, Dr. Maryruth Hancock [facility].   Hashimoto's disease    Follows with Dr. Ernest Haber @ [place].   History of hiatal hernia    2 cm hiatal hernia per 01/08/22 EGD   History of radiation therapy 2022   completed 01-18-2021   [facility] (hard of hearing)    has gotten bilateral aides.   Hypothyroidism    Follows w/ endocronologist, Dr. Ernest Haber @ [place].   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 Despina Arias @ [facility].   Optic neuritis    Follows w/ opthamology, Dr. Mora Appl.03/2020 right eye (almost complete blindness), 04/2020 left eye   Personal history of radiation  therapy    PONV (postoperative nausea and vomiting)    Status post dilation of esophageal narrowing 01/08/2022   EGD, esophagus dilated   Wears glasses    Past Surgical History:  Procedure Laterality Date   ABDOMINAL HYSTERECTOMY  04/23/22   BIOPSY BREAST Left 10/17/2020   BREAST LUMPECTOMY     BREAST LUMPECTOMY WITH RADIOACTIVE SEED AND SENTINEL LYMPH NODE BIOPSY Left 11/15/2020   Procedure: LEFT BREAST LUMPECTOMY WITH RADIOACTIVE SEED AND LEFT SENTINEL LYMPH NODE BIOPSY;  Surgeon: Harriette Bouillon, MD;  Location: [facility];  Service: General;  Laterality: Left;   BREAST SURGERY  August 2022   Cancer surgery   COLONOSCOPY  09/06/2021   cecal polyp   CT CORONARY CA SCORING  07/13/2020   Coronary Calcium = 0 (in Epic)   CYSTOSCOPY N/A 04/23/2022   Procedure: CYSTOSCOPY;  Surgeon: Jerene Bears, MD;  Location: [facility];  Service: Gynecology;  Laterality: N/A;   ESOPHAGOGASTRODUODENOSCOPY  01/08/2022   2cm hiatal hernia, esophagus dilated   TOTAL LAPAROSCOPIC HYSTERECTOMY WITH SALPINGECTOMY Bilateral 04/23/2022   Procedure: TOTAL LAPAROSCOPIC HYSTERECTOMY BILATERAL SALPINGECTOMY, LEFT OOPHORECTOMY;  Surgeon: Jerene Bears, MD;  Location: [facility] ;  Service: Gynecology;  Laterality: Bilateral;   TRANSTHORACIC ECHOCARDIOGRAM  11/28/2021   see results  in Epic   TRANSTHORACIC ECHOCARDIOGRAM  07/05/2020   in Epic   TUBAL LIGATION  02/2012   WISDOM TOOTH EXTRACTION     early 1990's   Patient Active Problem List   Diagnosis Date Noted   High risk medication use 05/06/2023   Genetic testing 07/04/2022   Family history of uterine cancer 06/24/2022   Local edema 02/25/2022   Hypothyroidism due to Hashimoto's thyroiditis 06/07/2021   Malignant neoplasm of lower-inner quadrant of left breast in female, estrogen receptor positive (HCC) 10/23/2020   Optic neuritis, left 10/18/2020   Myelin oligodendrocyte glycoprotein antibody disorder  (MOGAD) (HCC) 07/10/2020   Mitral regurgitation    Neuromyelitis optica (devic) (HCC) 05/07/2020   Hypothyroid 04/25/2020   Optic neuritis, right 04/24/2020     REFERRING PROVIDER:  Huel Cote, MD    REFERRING DIAG:  [phone] (ICD-10-CM) - Tear of right acetabular labrum, initial encounter   s/p Rt acetabular labral repair  Rationale for Evaluation and Treatment: Rehabilitation  THERAPY DIAG:  Muscle weakness (generalized)  Pain in right hip  Difficulty in walking, not elsewhere classified  ONSET DATE: DOS 03/31/23   SUBJECTIVE:                                                                                                                                                                                           SUBJECTIVE STATEMENT: I did 3 laps the other day in trying to improve endurance. Did elliptical and my hip was not what stopped me- it was cardio fatigue.   PERTINENT HISTORY:  N/a  PAIN:  Are you having pain? no  NPRS: 0/10 Location:  Aggravated by: Relieved by:   PRECAUTIONS:  None  RED FLAGS: None   WEIGHT BEARING RESTRICTIONS:  Yes 2 wk TDWB  FALLS:  Has patient fallen in last 6 months? No  LIVING ENVIRONMENT: Lives with: lives with their spouse and 2 cats Lives in: House [place] with basement (washer/dryer & garage where she parks) Stairs: Yes: Internal: 12-14 steps; on left going up and External: 4 steps; on right going up Has following equipment at home: None  OCCUPATION:  works for [employer] including lifting up 25-30#, pushing & pulling   PLOF:  Independent  PATIENT GOALS:  Back to gym, improve flexibility   OBJECTIVE:  Note: Objective measures were completed at Evaluation unless otherwise noted.  PATIENT SURVEYS:  FOTO 25   GAIT: Eval: arrived with bil axillary crutches with brace, using crutches more for stability 1/13: using SPC when walking a lot  TREATMENT DATE:  06/08/23 IASTM quads Tall kneeling hinge back Half kneel glut set, UE flexion + sidebend Reformer: supine foot work double & single leg; sidelying press; short box neutral & turnout press; tall kneel shoulder ext, row, flexion  06/05/23 Pt seen for aquatic therapy today.  Treatment took place in water 3.5-4.75 ft in depth at the [facility] pool. Temp of water was 91.  Pt entered/exited the pool via stairs independently with bilat rail.  -Walking forward/ backward for 10 continuous minutes with reciprocal arm swing Side stepping then side skipping -side lunge with arm addct/abdct with yellow hand floats x 6 - 3 way LE stretch RLE, (ITB, hamstring, adductor) x 10s each, foot on solid noodle - runners step ups with quick up/ slow coming down 2 x 8 (mindful of height of leg) R/L - SLS with opp arm moving noodle under water (circles/ swing front and back)->green hand bell (good challenge) - side plank on blue hand float x 30s each with hip abd 2 x 5 R/L (excellent execution) added top ue HB for improved control -plank with leg lifts 2 x 5. -STS from 3rd from bottom step with vc and demonstration for control.  2/19  Elliptical warm up 3 min lvl 1 (stopped due to fatigue) 6" step up and down 3x8  Shuttle SL leg press 75lbs 3x8 SLS on airex 30s 4x GTB paloff 3x10 ea Supine 90/90 3s 2x10 Figure 4 bridge 3x8 Kneeling plank 4x 10s       PATIENT EDUCATION:  Education details: Anatomy of condition, POC, HEP, exercise form/rationale Person educated: Patient Education method: Explanation, Demonstration, Tactile cues, Verbal cues, and Handouts Education comprehension: verbalized understanding, returned demonstration, verbal cues required, tactile cues required, and needs further education  HOME EXERCISE PROGRAM:  [email or web address]  Access Code: [email or web address]   Aquatic Access Code: [email or web address] URL:  [email or web address] Date: 05/13/23 - updated 05/29/23 Prepared by: [facility] This aquatic home exercise program from MedBridge utilizes pictures from land based exercises, but has been adapted prior to lamination and issuance.    ASSESSMENT:  CLINICAL IMPRESSION: Pt does not feel that a class setting is appropriate for her but did very well on the reformer. Will work to create length/strength exercises that are similar by using the weight room tools.    REHAB POTENTIAL: Good  CLINICAL DECISION MAKING: Stable/uncomplicated  EVALUATION COMPLEXITY: Low   GOALS: Goals reviewed with patient? Yes  SHORT TERM GOALS: Target date: 4 weeks 04/28/23  Pain free flexion to 90 Baseline: Goal status: achieved   2.  Demo controlled quad set with hold Baseline:  Goal status: Achieved   3.  30s sit to stand without pain or compensation Baseline:  Goal status: achieved   4.  SLS 30s without compensation Baseline:  Goal status: achieved  LONG TERM GOALS:  Able to demo step up on 6" step with level pelvis and proper form Baseline:  Goal status: MET  06/03/23  2.  Will tolerate at least 3 min on elliptical, demonstrating good tolerance to repetitive weight bearing motion Baseline:  Goal status: IMET  3.  Demonstrate proper form in at least 10 continuous lunges without increased pain Baseline:  Goal status: ongoing  Week 12 06/23/23  4.  Demonstrate gentle, double and single foot plyometric motions with good proximal form Baseline:  Goal status: ongoing Week 12 06/23/23      PLAN:  PT FREQUENCY: 1-2x/week  PT DURATION: 12 weeks  PLANNED INTERVENTIONS: [id number]- PT  Re-evaluation, 97110-Therapeutic exercises, 97530- Therapeutic activity, [id number]- Neuromuscular re-education, [phone]- Self Care, 09811- Manual therapy, [phone]- Gait training, [phone]- Aquatic Therapy, Patient/Family education, Balance training, Stair training, Taping, Dry  Needling, Joint mobilization, Spinal mobilization, Scar mobilization, Cryotherapy, and Moist heat.  PLAN FOR NEXT SESSION: reformer exercises to cable/resistance band  Ransom Nickson C. Sukhdeep Wieting PT, DPT 06/08/23 7:24 PM  [facility] [address] [place], [place], [address] Phone: [phone]   Fax:  [phone]

## 2023-06-11 ENCOUNTER — Ambulatory Visit (HOSPITAL_BASED_OUTPATIENT_CLINIC_OR_DEPARTMENT_OTHER): Payer: Commercial Managed Care - PPO | Admitting: Physical Therapy

## 2023-06-11 ENCOUNTER — Encounter (HOSPITAL_BASED_OUTPATIENT_CLINIC_OR_DEPARTMENT_OTHER): Payer: Self-pay | Admitting: Physical Therapy

## 2023-06-11 DIAGNOSIS — M25551 Pain in right hip: Secondary | ICD-10-CM

## 2023-06-11 DIAGNOSIS — R262 Difficulty in walking, not elsewhere classified: Secondary | ICD-10-CM

## 2023-06-11 DIAGNOSIS — M6281 Muscle weakness (generalized): Secondary | ICD-10-CM

## 2023-06-11 DIAGNOSIS — C50312 Malignant neoplasm of lower-inner quadrant of left female breast: Secondary | ICD-10-CM | POA: Diagnosis not present

## 2023-06-11 NOTE — Therapy (Signed)
 OUTPATIENT PHYSICAL THERAPY TREATMENT   Patient Name: Valerie [name] MRN: [id number] DOB:22-Dec-1970, 53 y.o., female Today's Date: 06/11/2023  END OF SESSION:  PT End of Session - 06/11/23 1627     Visit Number 18    Number of Visits 25    Date for PT Re-Evaluation 06/27/22    Authorization Type UHC  no VL    PT Start Time 1610    PT Stop Time 1650    PT Time Calculation (min) 40 min    Behavior During Therapy [facility] for tasks assessed/performed               Past Medical History:  Diagnosis Date   Abnormal echocardiogram 11/28/2021   EF 60 -65%, abnormal global longitudinal strain, see cardiology OV in Epic dated 12/25/21   Breast cancer (HCC)    Cancer (HCC) 09/2020   left breast, ER+, s/p radiation therapy 12/25/20 - 01/18/21, Patient follows with Dr. Serena Croissant @ [facility].   COVID-19 11/2019   flu-like symptoms, cough, no hospitalizations   Edema    lower extremity, follows w/ [facility], Sabino Dick, [place]. See OV dated 03/24/22 in Epic.   GERD (gastroesophageal reflux disease)    Follows w/ Dr. Judeen Hammans, gastroenterologist and PCP, Dr. Maryruth Hancock [facility].   Hashimoto's disease    Follows with Dr. Ernest Haber @ [place].   History of hiatal hernia    2 cm hiatal hernia per 01/08/22 EGD   History of radiation therapy 2022   completed 01-18-2021   [facility] (hard of hearing)    has gotten bilateral aides.   Hypothyroidism    Follows w/ endocronologist, Dr. Ernest Haber @ [place].   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 Despina Arias @ [facility].   Optic neuritis    Follows w/ opthamology, Dr. Mora Appl.03/2020 right eye (almost complete blindness), 04/2020 left eye   Personal history of radiation therapy    PONV (postoperative nausea and vomiting)     Status post dilation of esophageal narrowing 01/08/2022   EGD, esophagus dilated   Wears glasses    Past Surgical History:  Procedure Laterality Date   ABDOMINAL HYSTERECTOMY  04/23/22   BIOPSY BREAST Left 10/17/2020   BREAST LUMPECTOMY     BREAST LUMPECTOMY WITH RADIOACTIVE SEED AND SENTINEL LYMPH NODE BIOPSY Left 11/15/2020   Procedure: LEFT BREAST LUMPECTOMY WITH RADIOACTIVE SEED AND LEFT SENTINEL LYMPH NODE BIOPSY;  Surgeon: Harriette Bouillon, MD;  Location:  SUR[facility]ERY CENTER;  Service: General;  Laterality: Left;   BREAST SURGERY  August 2022   Cancer surgery   COLONOSCOPY  09/06/2021   cecal polyp   CT CORONARY CA SCORING  07/13/2020   Coronary Calcium = 0 (in Epic)   CYSTOSCOPY N/A 04/23/2022   Procedure: CYSTOSCOPY;  Surgeon: Jerene Bears, MD;  Location: [facility];  Service: Gynecology;  Laterality: N/A;   ESOPHAGOGASTRODUODENOSCOPY  01/08/2022   2cm hiatal hernia, esophagus dilated   TOTAL LAPAROSCOPIC HYSTERECTOMY WITH SALPINGECTOMY Bilateral 04/23/2022   Procedure: TOTAL LAPAROSCOPIC HYSTERECTOMY BILATERAL SALPINGECTOMY, LEFT OOPHORECTOMY;  Surgeon: Jerene Bears, MD;  Location: [facility];  Service: Gynecology;  Laterality: Bilateral;   TRANSTHORACIC ECHOCARDIOGRAM  11/28/2021   see results in Epic   TRANSTHORACIC ECHOCARDIOGRAM  07/05/2020  in Epic   TUBAL LIGATION  02/2012   WISDOM TOOTH EXTRACTION     early 1990's   Patient Active Problem List   Diagnosis Date Noted   High risk medication use 05/06/2023   Genetic testing 07/04/2022   Family history of uterine cancer 06/24/2022   Local edema 02/25/2022   Hypothyroidism due to Hashimoto's thyroiditis 06/07/2021   Malignant neoplasm of lower-inner quadrant of left breast in female, estrogen receptor positive (HCC) 10/23/2020   Optic neuritis, left 10/18/2020   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) 07/10/2020   Mitral regurgitation     Neuromyelitis optica (devic) (HCC) 05/07/2020   Hypothyroid 04/25/2020   Optic neuritis, right 04/24/2020     REFERRING PROVIDER:  Huel Cote, MD    REFERRING DIAG:  [phone] (ICD-10-CM) - Tear of right acetabular labrum, initial encounter   s/p Rt acetabular labral repair  Rationale for Evaluation and Treatment: Rehabilitation  THERAPY DIAG:  Muscle weakness (generalized)  Pain in right hip  Difficulty in walking, not elsewhere classified  ONSET DATE: DOS 03/31/23   SUBJECTIVE:                                                                                                                                                                                           SUBJECTIVE STATEMENT: Pt voices frustration that she can only make it 3 laps around track before she needs to stop.     PERTINENT HISTORY:  N/a  PAIN:  Are you having pain? no  NPRS: 0/10 Location:  Aggravated by: Relieved by:   PRECAUTIONS:  None  RED FLAGS: None   WEIGHT BEARING RESTRICTIONS:  Yes 2 wk TDWB  FALLS:  Has patient fallen in last 6 months? No  LIVING ENVIRONMENT: Lives with: lives with their spouse and 2 cats Lives in: House [place] with basement (washer/dryer & garage where she parks) Stairs: Yes: Internal: 12-14 steps; on left going up and External: 4 steps; on right going up Has following equipment at home: None  OCCUPATION:  works for [employer] including lifting up 25-30#, pushing & pulling   PLOF:  Independent  PATIENT GOALS:  Back to gym, improve flexibility   OBJECTIVE:  Note: Objective measures were completed at Evaluation unless otherwise noted.  PATIENT SURVEYS:  FOTO 25   GAIT: Eval: arrived with bil axillary crutches with brace, using crutches more for stability 1/13: using SPC when walking a lot  TREATMENT DATE:  [facility]  Adult PT Treatment:                                                DATE: 06/11/23 Pt seen for aquatic therapy today.  Treatment took place in water 3.5-4.75 ft in depth at the [facility] pool. Temp of water was 85-91.  Pt entered/exited the pool via stairs independently with single rail. * 32 ft of front flutter kick with kick board support x 4 * 25 ft of back flutter kick x 4 * 32 ft of side stroke x 2 L/R * side to side pendulum x 5 then front/side/back/side pendulums  * suspended knee tucks without / with hip twists * TUG like exercise in water from bench x 3  * 3 way LE stretch with ankle on hollow noodle;  quad and hip flexor stretch with ankle on hollow noodle  Pt requires the buoyancy and hydrostatic pressure of water for support, and to offload joints by unweighting joint load by at least 50 % in navel deep water and by at least 75-80% in chest to neck deep water.  Viscosity of the water is needed for resistance of strengthening. Water current perturbations provides challenge to standing balance requiring increased core activation.   06/08/23 IASTM quads Tall kneeling hinge back Half kneel glut set, UE flexion + sidebend Reformer: supine foot work double & single leg; sidelying press; short box neutral & turnout press; tall kneel shoulder ext, row, flexion  06/05/23 Pt seen for aquatic therapy today.  Treatment took place in water 3.5-4.75 ft in depth at the [facility] pool. Temp of water was 91.  Pt entered/exited the pool via stairs independently with bilat rail.  -Walking forward/ backward for 10 continuous minutes with reciprocal arm swing Side stepping then side skipping -side lunge with arm addct/abdct with yellow hand floats x 6 - 3 way LE stretch RLE, (ITB, hamstring, adductor) x 10s each, foot on solid noodle - runners step ups with quick up/ slow coming down 2 x 8 (mindful of height of leg) R/L - SLS with opp arm moving noodle under water (circles/  swing front and back)->green hand bell (good challenge) - side plank on blue hand float x 30s each with hip abd 2 x 5 R/L (excellent execution) added top ue HB for improved control -plank with leg lifts 2 x 5. -STS from 3rd from bottom step with vc and demonstration for control.  2/19  Elliptical warm up 3 min lvl 1 (stopped due to fatigue) 6" step up and down 3x8  Shuttle SL leg press 75lbs 3x8 SLS on airex 30s 4x GTB paloff 3x10 ea Supine 90/90 3s 2x10 Figure 4 bridge 3x8 Kneeling plank 4x 10s       PATIENT EDUCATION:  Education details: Anatomy of condition, POC, HEP, exercise form/rationale Person educated: Patient Education method: Explanation, Demonstration, Tactile cues, Verbal cues, and Handouts Education comprehension: verbalized understanding, returned demonstration, verbal cues required, tactile cues required, and needs further education  HOME EXERCISE PROGRAM:  [email or web address]  Access Code: [email or web address]   Aquatic Access Code: [email or web address] URL: [email or web address] Date: 05/13/23 - updated 05/29/23 Prepared by: [facility] [address] This aquatic home exercise program from [employer] utilizes pictures from land based exercises, but has been adapted prior to lamination and issuance.    ASSESSMENT:  CLINICAL  IMPRESSION: Trialed front/back flutter kick with good tolerance; pt reported cardio fatigue instead of muscle fatigue.  Reviewed core exercises in pool and LE stretches.  Per PT on land last session:  Will work to create length/strength exercises that are similar by using the weight room tools. Pt is progressing gradually towards remaining goals.    REHAB POTENTIAL: Good  CLINICAL DECISION MAKING: Stable/uncomplicated  EVALUATION COMPLEXITY: Low   GOALS: Goals reviewed with patient? Yes  SHORT TERM GOALS: Target date: 4 weeks 04/28/23  Pain free flexion to 90 Baseline: Goal status: achieved   2.  Demo  controlled quad set with hold Baseline:  Goal status: Achieved   3.  30s sit to stand without pain or compensation Baseline:  Goal status: achieved   4.  SLS 30s without compensation Baseline:  Goal status: achieved  LONG TERM GOALS:  Able to demo step up on 6" step with level pelvis and proper form Baseline:  Goal status: MET  06/03/23  2.  Will tolerate at least 3 min on elliptical, demonstrating good tolerance to repetitive weight bearing motion Baseline:  Goal status: MET  3.  Demonstrate proper form in at least 10 continuous lunges without increased pain Baseline:  Goal status: ongoing  Week 12 06/23/23  4.  Demonstrate gentle, double and single foot plyometric motions with good proximal form Baseline:  Goal status: ongoing Week 12 06/23/23      PLAN:  PT FREQUENCY: 1-2x/week  PT DURATION: 12 weeks  PLANNED INTERVENTIONS: [id number]- PT Re-evaluation, 97110-Therapeutic exercises, 97530- Therapeutic activity, 97112- Neuromuscular re-education, 97535- Self Care, 47829- Manual therapy, [phone]- Gait training, [phone]- Aquatic Therapy, Patient/Family education, Balance training, Stair training, Taping, Dry Needling, Joint mobilization, Spinal mobilization, Scar mobilization, Cryotherapy, and Moist heat.  PLAN FOR NEXT SESSION: reformer exercises to cable/resistance band   Mayer Camel, PTA 06/11/23 4:57 PM [facility] [address] [place], [place], [address] Phone: [phone]   Fax:  [phone]

## 2023-06-15 ENCOUNTER — Encounter (HOSPITAL_BASED_OUTPATIENT_CLINIC_OR_DEPARTMENT_OTHER): Payer: Self-pay | Admitting: Physical Therapy

## 2023-06-15 ENCOUNTER — Ambulatory Visit (HOSPITAL_BASED_OUTPATIENT_CLINIC_OR_DEPARTMENT_OTHER): Payer: Commercial Managed Care - PPO | Attending: Orthopaedic Surgery | Admitting: Physical Therapy

## 2023-06-15 DIAGNOSIS — R262 Difficulty in walking, not elsewhere classified: Secondary | ICD-10-CM | POA: Diagnosis present

## 2023-06-15 DIAGNOSIS — M6281 Muscle weakness (generalized): Secondary | ICD-10-CM | POA: Diagnosis present

## 2023-06-15 DIAGNOSIS — M25551 Pain in right hip: Secondary | ICD-10-CM | POA: Diagnosis present

## 2023-06-15 DIAGNOSIS — M25651 Stiffness of right hip, not elsewhere classified: Secondary | ICD-10-CM | POA: Insufficient documentation

## 2023-06-15 NOTE — Therapy (Signed)
 OUTPATIENT PHYSICAL THERAPY TREATMENT   Patient Name: Valerie [name] MRN: [id number] DOB:07-04-1970, 53 y.o., female Today's Date: 06/15/2023  END OF SESSION:  PT End of Session - 06/15/23 1149     Visit Number 19    Number of Visits 25    Date for PT Re-Evaluation 06/27/22    Authorization Type UHC  no VL    PT Start Time 1145    PT Stop Time 1225    PT Time Calculation (min) 40 min    Behavior During Therapy WFL for tasks assessed/performed                Past Medical History:  Diagnosis Date   Abnormal echocardiogram 11/28/2021   EF 60 -65%, abnormal global longitudinal strain, see cardiology OV in Epic dated 12/25/21   Breast cancer (HCC)    Cancer (HCC) 09/2020   left breast, ER+, s/p radiation therapy 12/25/20 - 01/18/21, Patient follows with Dr. Serena Croissant @ [facility].   COVID-19 11/2019   flu-like symptoms, cough, no hospitalizations   Edema    lower extremity, follows w/ [facility], Sabino Dick, [place]. See OV dated 03/24/22 in Epic.   GERD (gastroesophageal reflux disease)    Follows w/ Dr. Judeen Hammans, gastroenterologist and PCP, Dr. [facility] @ [facility].   Hashimoto's disease    Follows with Dr. Ernest Haber @ [place].   History of hiatal hernia    2 cm hiatal hernia per 01/08/22 EGD   History of radiation therapy 2022   completed 01-18-2021   [facility] (hard of hearing)    has gotten bilateral aides.   Hypothyroidism    Follows w/ endocronologist, Dr. Ernest Haber @ [place].   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 Despina Arias @ [facility].   Optic neuritis    Follows w/ opthamology, Dr. Mora Appl.03/2020 right eye (almost complete blindness), 04/2020 left eye   Personal history of radiation therapy    PONV (postoperative nausea and vomiting)     Status post dilation of esophageal narrowing 01/08/2022   EGD, esophagus dilated   Wears glasses    Past Surgical History:  Procedure Laterality Date   ABDOMINAL HYSTERECTOMY  04/23/22   BIOPSY BREAST Left 10/17/2020   BREAST LUMPECTOMY     BREAST LUMPECTOMY WITH RADIOACTIVE SEED AND SENTINEL LYMPH NODE BIOPSY Left 11/15/2020   Procedure: LEFT BREAST LUMPECTOMY WITH RADIOACTIVE SEED AND LEFT SENTINEL LYMPH NODE BIOPSY;  Surgeon: Harriette Bouillon, MD;  Location: [facility];  Service: General;  Laterality: Left;   BREAST SURGERY  August 2022   Cancer surgery   COLONOSCOPY  09/06/2021   cecal polyp   CT CORONARY CA SCORING  07/13/2020   Coronary Calcium = 0 (in Epic)   CYSTOSCOPY N/A 04/23/2022   Procedure: CYSTOSCOPY;  Surgeon: Jerene Bears, MD;  Location: [facility];  Service: Gynecology;  Laterality: N/A;   ESOPHAGOGASTRODUODENOSCOPY  01/08/2022   2cm hiatal hernia, esophagus dilated   TOTAL LAPAROSCOPIC HYSTERECTOMY WITH SALPINGECTOMY Bilateral 04/23/2022   Procedure: TOTAL LAPAROSCOPIC HYSTERECTOMY BILATERAL SALPINGECTOMY, LEFT OOPHORECTOMY;  Surgeon: Jerene Bears, MD;  Location: [facility];  Service: Gynecology;  Laterality: Bilateral;   TRANSTHORACIC ECHOCARDIOGRAM  11/28/2021   see results in Epic   TRANSTHORACIC ECHOCARDIOGRAM  07/05/2020  in Epic   TUBAL LIGATION  02/2012   WISDOM TOOTH EXTRACTION     early 1990's   Patient Active Problem List   Diagnosis Date Noted   High risk medication use 05/06/2023   Genetic testing 07/04/2022   Family history of uterine cancer 06/24/2022   Local edema 02/25/2022   Hypothyroidism due to Hashimoto's thyroiditis 06/07/2021   Malignant neoplasm of lower-inner quadrant of left breast in female, estrogen receptor positive (HCC) 10/23/2020   Optic neuritis, left 10/18/2020   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) 07/10/2020   Mitral regurgitation     Neuromyelitis optica (devic) (HCC) 05/07/2020   Hypothyroid 04/25/2020   Optic neuritis, right 04/24/2020     REFERRING PROVIDER:  Huel Cote, MD    REFERRING DIAG:  [phone] (ICD-10-CM) - Tear of right acetabular labrum, initial encounter   s/p Rt acetabular labral repair  Rationale for Evaluation and Treatment: Rehabilitation  THERAPY DIAG:  Muscle weakness (generalized)  Pain in right hip  Difficulty in walking, not elsewhere classified  Stiffness of right hip, not elsewhere classified  ONSET DATE: DOS 03/31/23   SUBJECTIVE:                                                                                                                                                                                           SUBJECTIVE STATEMENT:  Pt reports she still has some anterior hip discomfort with repeated lifting. Pt is doing elliptical 5 min and then walking.   PERTINENT HISTORY:  N/a  PAIN:  Are you having pain? no  NPRS: 0/10 Location:  Aggravated by: Relieved by:   PRECAUTIONS:  None  RED FLAGS: None   WEIGHT BEARING RESTRICTIONS:  No  FALLS:  Has patient fallen in last 6 months? No  LIVING ENVIRONMENT: Lives with: lives with their spouse and 2 cats Lives in: House [place] with basement (washer/dryer & garage where she parks) Stairs: Yes: Internal: 12-14 steps; on left going up and External: 4 steps; on right going up Has following equipment at home: None  OCCUPATION:  works for [employer] including lifting up 25-30#, pushing & pulling   PLOF:  Independent  PATIENT GOALS:  Back to gym, improve flexibility   OBJECTIVE:  Note: Objective measures were completed at Evaluation unless otherwise noted.  PATIENT SURVEYS:  FOTO 25   GAIT: Eval: arrived with bil axillary crutches with brace, using crutches more for stability 1/13: using SPC when walking a lot  TREATMENT DATE:  [facility] Adult PT Treatment:                                                DATE:   3/3  Upright  bike warm up 5 min Mulligan lateral mob, inf, and prone figure 4 PA grade III  Supine thomas hip flexor march no weight 3x8 Adductor bridge 3x10 (with foam roller)   Prone hip flexor stretch with half foam roll 30s 4x Bird dog 2x10 each side Deep squat with TRX assisted (focus on getting into catcher's position) 3x8   06/11/23 Pt seen for aquatic therapy today.  Treatment took place in water 3.5-4.75 ft in depth at the [facility] pool. Temp of water was 85-91.  Pt entered/exited the pool via stairs independently with single rail. * 32 ft of front flutter kick with kick board support x 4 * 25 ft of back flutter kick x 4 * 32 ft of side stroke x 2 L/R * side to side pendulum x 5 then front/side/back/side pendulums  * suspended knee tucks without / with hip twists * TUG like exercise in water from bench x 3  * 3 way LE stretch with ankle on hollow noodle;  quad and hip flexor stretch with ankle on hollow noodle  Pt requires the buoyancy and hydrostatic pressure of water for support, and to offload joints by unweighting joint load by at least 50 % in navel deep water and by at least 75-80% in chest to neck deep water.  Viscosity of the water is needed for resistance of strengthening. Water current perturbations provides challenge to standing balance requiring increased core activation.   06/08/23 IASTM quads Tall kneeling hinge back Half kneel glut set, UE flexion + sidebend Reformer: supine foot work double & single leg; sidelying press; short box neutral & turnout press; tall kneel shoulder ext, row, flexion  06/05/23 Pt seen for aquatic therapy today.  Treatment took place in water 3.5-4.75 ft in depth at the [facility] pool. Temp of water was 91.  Pt entered/exited the pool via stairs independently with bilat  rail.  -Walking forward/ backward for 10 continuous minutes with reciprocal arm swing Side stepping then side skipping -side lunge with arm addct/abdct with yellow hand floats x 6 - 3 way LE stretch RLE, (ITB, hamstring, adductor) x 10s each, foot on solid noodle - runners step ups with quick up/ slow coming down 2 x 8 (mindful of height of leg) R/L - SLS with opp arm moving noodle under water (circles/ swing front and back)->green hand bell (good challenge) - side plank on blue hand float x 30s each with hip abd 2 x 5 R/L (excellent execution) added top ue HB for improved control -plank with leg lifts 2 x 5. -STS from 3rd from bottom step with vc and demonstration for control.  2/19  Elliptical warm up 3 min lvl 1 (stopped due to fatigue) 6" step up and down 3x8  Shuttle SL leg press 75lbs 3x8 SLS on airex 30s 4x GTB paloff 3x10 ea Supine 90/90 3s 2x10 Figure 4 bridge 3x8 Kneeling plank 4x 10s     PATIENT EDUCATION:  Education details: Anatomy of condition, POC, HEP, exercise form/rationale Person educated: Patient Education method: Explanation, Demonstration, Tactile cues, Verbal cues, and Handouts Education comprehension: verbalized understanding, returned demonstration, verbal cues required, tactile cues  required, and needs further education  HOME EXERCISE PROGRAM:  [email or web address]  Access Code: [email or web address]   Aquatic Access Code: [email or web address] URL: [email or web address] Date: 05/13/23 - updated 05/29/23 Prepared by: [facility] - [address] This aquatic home exercise program from MedBridge utilizes pictures from land based exercises, but has been adapted prior to lamination and issuance.    ASSESSMENT:  CLINICAL IMPRESSION: Pt demos hip flexor weakness and hip extension ROM deficits in prone figure 4 which may be causing anterior hip discomfort. ROM/depth of exercise progressed and SL stability added to HEP. Pt fatigues quickly with  new exercise progression. Plan to continue with aquatic and land based therapy for hip mobility and strength on the R. Consider reformer/pilates review on land at next.   REHAB POTENTIAL: Good  CLINICAL DECISION MAKING: Stable/uncomplicated  EVALUATION COMPLEXITY: Low   GOALS: Goals reviewed with patient? Yes  SHORT TERM GOALS: Target date: 4 weeks 04/28/23  Pain free flexion to 90 Baseline: Goal status: achieved   2.  Demo controlled quad set with hold Baseline:  Goal status: Achieved   3.  30s sit to stand without pain or compensation Baseline:  Goal status: achieved   4.  SLS 30s without compensation Baseline:  Goal status: achieved  LONG TERM GOALS:  Able to demo step up on 6" step with level pelvis and proper form Baseline:  Goal status: MET  06/03/23  2.  Will tolerate at least 3 min on elliptical, demonstrating good tolerance to repetitive weight bearing motion Baseline:  Goal status: MET  3.  Demonstrate proper form in at least 10 continuous lunges without increased pain Baseline:  Goal status: ongoing  Week 12 06/23/23  4.  Demonstrate gentle, double and single foot plyometric motions with good proximal form Baseline:  Goal status: ongoing Week 12 06/23/23      PLAN:  PT FREQUENCY: 1-2x/week  PT DURATION: 12 weeks  PLANNED INTERVENTIONS: 97164- PT Re-evaluation, 97110-Therapeutic exercises, 97530- Therapeutic activity, 97112- Neuromuscular re-education, 97535- Self Care, 56213- Manual therapy, [phone]- Gait training, [phone]- Aquatic Therapy, Patient/Family education, Balance training, Stair training, Taping, Dry Needling, Joint mobilization, Spinal mobilization, Scar mobilization, Cryotherapy, and Moist heat.  PLAN FOR NEXT SESSION: reformer exercises to cable/resistance band  Zebedee Iba PT, DPT 06/15/23 12:37 PM

## 2023-06-17 ENCOUNTER — Ambulatory Visit (HOSPITAL_BASED_OUTPATIENT_CLINIC_OR_DEPARTMENT_OTHER): Payer: Commercial Managed Care - PPO | Admitting: Physical Therapy

## 2023-06-17 ENCOUNTER — Encounter (HOSPITAL_BASED_OUTPATIENT_CLINIC_OR_DEPARTMENT_OTHER): Payer: Self-pay | Admitting: Physical Therapy

## 2023-06-17 DIAGNOSIS — M25651 Stiffness of right hip, not elsewhere classified: Secondary | ICD-10-CM

## 2023-06-17 DIAGNOSIS — M25551 Pain in right hip: Secondary | ICD-10-CM

## 2023-06-17 DIAGNOSIS — R262 Difficulty in walking, not elsewhere classified: Secondary | ICD-10-CM

## 2023-06-17 DIAGNOSIS — M6281 Muscle weakness (generalized): Secondary | ICD-10-CM

## 2023-06-17 NOTE — Therapy (Signed)
 OUTPATIENT PHYSICAL THERAPY TREATMENT   Patient Name: Valerie [name] MRN: [id number] DOB:1970/09/10, 53 y.o., female Today's Date: 06/17/2023  END OF SESSION:  PT End of Session - 06/17/23 1049     Visit Number 20    Number of Visits 25    Date for PT Re-Evaluation 06/27/22    Authorization Type UHC  no VL    PT Start Time 1100    PT Stop Time 1140    PT Time Calculation (min) 40 min    Behavior During Therapy WFL for tasks assessed/performed                Past Medical History:  Diagnosis Date   Abnormal echocardiogram 11/28/2021   EF 60 -65%, abnormal global longitudinal strain, see cardiology OV in Epic dated 12/25/21   Breast cancer (HCC)    Cancer (HCC) 09/2020   left breast, ER+, s/p radiation therapy 12/25/20 - 01/18/21, Patient follows with Dr. Serena Croissant @ [facility].   COVID-19 11/2019   flu-like symptoms, cough, no hospitalizations   Edema    lower extremity, follows w/ [facility], Sabino Dick, [place]. See OV dated 03/24/22 in Epic.   GERD (gastroesophageal reflux disease)    Follows w/ Dr. Judeen Hammans, gastroenterologist and PCP, Dr. [facility].   Hashimoto's disease    Follows with Dr. Ernest Haber @ [place].   History of hiatal hernia    2 cm hiatal hernia per 01/08/22 EGD   History of radiation therapy 2022   completed 01-18-2021   [facility] (hard of hearing)    has gotten bilateral aides.   Hypothyroidism    Follows w/ endocronologist, Dr. Ernest Haber @ [place].   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 Despina Arias @ [facility].   Optic neuritis    Follows w/ opthamology, Dr. Mora Appl.03/2020 right eye (almost complete blindness), 04/2020 left eye   Personal history of radiation therapy    PONV (postoperative nausea and vomiting)     Status post dilation of esophageal narrowing 01/08/2022   EGD, esophagus dilated   Wears glasses    Past Surgical History:  Procedure Laterality Date   ABDOMINAL HYSTERECTOMY  04/23/22   BIOPSY BREAST Left 10/17/2020   BREAST LUMPECTOMY     BREAST LUMPECTOMY WITH RADIOACTIVE SEED AND SENTINEL LYMPH NODE BIOPSY Left 11/15/2020   Procedure: LEFT BREAST LUMPECTOMY WITH RADIOACTIVE SEED AND LEFT SENTINEL LYMPH NODE BIOPSY;  Surgeon: Harriette Bouillon, MD;  Location: [facility];  Service: General;  Laterality: Left;   BREAST SURGERY  August 2022   Cancer surgery   COLONOSCOPY  09/06/2021   cecal polyp   CT CORONARY CA SCORING  07/13/2020   Coronary Calcium = 0 (in Epic)   CYSTOSCOPY N/A 04/23/2022   Procedure: CYSTOSCOPY;  Surgeon: Jerene Bears, MD;  Location: [facility];  Service: Gynecology;  Laterality: N/A;   ESOPHAGOGASTRODUODENOSCOPY  01/08/2022   2cm hiatal hernia, esophagus dilated   TOTAL LAPAROSCOPIC HYSTERECTOMY WITH SALPINGECTOMY Bilateral 04/23/2022   Procedure: TOTAL LAPAROSCOPIC HYSTERECTOMY BILATERAL SALPINGECTOMY, LEFT OOPHORECTOMY;  Surgeon: Jerene Bears, MD;  Location: [facility];  Service: Gynecology;  Laterality: Bilateral;   TRANSTHORACIC ECHOCARDIOGRAM  11/28/2021   see results in Epic   TRANSTHORACIC ECHOCARDIOGRAM  07/05/2020  in Epic   TUBAL LIGATION  02/2012   WISDOM TOOTH EXTRACTION     early 1990's   Patient Active Problem List   Diagnosis Date Noted   High risk medication use 05/06/2023   Genetic testing 07/04/2022   Family history of uterine cancer 06/24/2022   Local edema 02/25/2022   Hypothyroidism due to Hashimoto's thyroiditis 06/07/2021   Malignant neoplasm of lower-inner quadrant of left breast in female, estrogen receptor positive (HCC) 10/23/2020   Optic neuritis, left 10/18/2020   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) 07/10/2020   Mitral regurgitation     Neuromyelitis optica (devic) (HCC) 05/07/2020   Hypothyroid 04/25/2020   Optic neuritis, right 04/24/2020     REFERRING PROVIDER:  Huel Cote, MD    REFERRING DIAG:  [phone] (ICD-10-CM) - Tear of right acetabular labrum, initial encounter   s/p Rt acetabular labral repair  Rationale for Evaluation and Treatment: Rehabilitation  THERAPY DIAG:  Muscle weakness (generalized)  Pain in right hip  Difficulty in walking, not elsewhere classified  Stiffness of right hip, not elsewhere classified  ONSET DATE: DOS 03/31/23   SUBJECTIVE:                                                                                                                                                                                           SUBJECTIVE STATEMENT: Pt reports she is achey sore from previous session. She was able to the pool this morning. She did repeat previous exercise yesterday as well.   PERTINENT HISTORY:  N/a  PAIN:  Are you having pain? no  NPRS: 0/10   PRECAUTIONS:  None  RED FLAGS: None   WEIGHT BEARING RESTRICTIONS:  No  FALLS:  Has patient fallen in last 6 months? No  LIVING ENVIRONMENT: Lives with: lives with their spouse and 2 cats Lives in: [place] with basement (washer/dryer & garage where she parks) Stairs: Yes: Internal: 12-14 steps; on left going up and External: 4 steps; on right going up Has following equipment at home: None  OCCUPATION:  works for [employer] including lifting up 25-30#, pushing & pulling   PLOF:  Independent  PATIENT GOALS:  Back to gym, improve flexibility   OBJECTIVE:  Note: Objective measures were completed at Evaluation unless otherwise noted.  PATIENT SURVEYS:  FOTO 25   GAIT: Eval: arrived with bil axillary crutches with brace, using crutches more for stability 1/13: using SPC when walking a lot  TREATMENT DATE:  [facility] Adult PT Treatment:                                                DATE:    3/5  Nustep warm up 5 min STM R posterior hip and glute med Prone fig 4 PA grade III  Supine ITB stretch 30s 3x  Child's pose with SB 10x 3s each way  Child's pose with SB 5x each way 3s holds Quadruped rock backs with IR bias 10x 2s holds DL Bosu squats 3x8 Standing hip flexor stretch with reach (foot in slight ER) 30s 3x   3/3  Upright  bike warm up 5 min Mulligan lateral mob, inf, and prone figure 4 PA grade III  Supine thomas hip flexor march no weight 3x8 Adductor bridge 3x10 (with foam roller)   Prone hip flexor stretch with half foam roll 30s 4x Bird dog 2x10 each side Deep squat with TRX assisted (focus on getting into catcher's position) 3x8   06/11/23 Pt seen for aquatic therapy today.  Treatment took place in water 3.5-4.75 ft in depth at the [facility] pool. Temp of water was 85-91.  Pt entered/exited the pool via stairs independently with single rail. * 32 ft of front flutter kick with kick board support x 4 * 25 ft of back flutter kick x 4 * 32 ft of side stroke x 2 L/R * side to side pendulum x 5 then front/side/back/side pendulums  * suspended knee tucks without / with hip twists * TUG like exercise in water from bench x 3  * 3 way LE stretch with ankle on hollow noodle;  quad and hip flexor stretch with ankle on hollow noodle  Pt requires the buoyancy and hydrostatic pressure of water for support, and to offload joints by unweighting joint load by at least 50 % in navel deep water and by at least 75-80% in chest to neck deep water.  Viscosity of the water is needed for resistance of strengthening. Water current perturbations provides challenge to standing balance requiring increased core activation.   06/08/23 IASTM quads Tall kneeling hinge back Half kneel glut set, UE flexion + sidebend Reformer: supine foot work double &  single leg; sidelying press; short box neutral & turnout press; tall kneel shoulder ext, row, flexion  06/05/23 Pt seen for aquatic therapy today.  Treatment took place in water 3.5-4.75 ft in depth at the [facility] pool. Temp of water was 91.  Pt entered/exited the pool via stairs independently with bilat rail.  -Walking forward/ backward for 10 continuous minutes with reciprocal arm swing Side stepping then side skipping -side lunge with arm addct/abdct with yellow hand floats x 6 - 3 way LE stretch RLE, (ITB, hamstring, adductor) x 10s each, foot on solid noodle - runners step ups with quick up/ slow coming down 2 x 8 (mindful of height of leg) R/L - SLS with opp arm moving noodle under water (circles/ swing front and back)->green hand bell (good challenge) - side plank on blue hand float x 30s each with hip abd 2 x 5 R/L (excellent execution) added top ue HB for improved control -plank with leg lifts 2 x 5. -STS from 3rd from bottom step with vc and demonstration for control.  2/19  Elliptical warm up 3 min lvl 1 (stopped due to fatigue) 6"  step up and down 3x8  Shuttle SL leg press 75lbs 3x8 SLS on airex 30s 4x GTB paloff 3x10 ea Supine 90/90 3s 2x10 Figure 4 bridge 3x8 Kneeling plank 4x 10s     PATIENT EDUCATION:  Education details: Anatomy of condition, POC, HEP, exercise form/rationale Person educated: Patient Education method: Explanation, Demonstration, Tactile cues, Verbal cues, and Handouts Education comprehension: verbalized understanding, returned demonstration, verbal cues required, tactile cues required, and needs further education  HOME EXERCISE PROGRAM:  [email or web address]  Access Code: [email or web address]   Aquatic Access Code: [email or web address] URL: [email or web address] Date: 05/13/23 - updated 05/29/23 Prepared by: [facility] - [facility] Re[facility] - [address] This aquatic home exercise program from [employer] utilizes pictures from land based  exercises, but has been adapted prior to lamination and issuance.    ASSESSMENT:  CLINICAL IMPRESSION: Pt too sore today to perform progressive strength so session focused on manual therapy and progressive mobility/stretching. Pt was able to improve ROM following session by 10 deg in all planes, especially ER. Pt advised on reduction of frequency of program in order to improve recovery. PROM WFL today with tightness mostly into hip ext. Plan to continue with progressive strength as tolerated and progressing towards SL and unstable surface training. Pt would benefit from continued skilled therapy in order to reach goals and maximize functional R LE strength and ROM for full return to PLOF.   REHAB POTENTIAL: Good  CLINICAL DECISION MAKING: Stable/uncomplicated  EVALUATION COMPLEXITY: Low   GOALS: Goals reviewed with patient? Yes  SHORT TERM GOALS: Target date: 4 weeks 04/28/23  Pain free flexion to 90 Baseline: Goal status: achieved   2.  Demo controlled quad set with hold Baseline:  Goal status: Achieved   3.  30s sit to stand without pain or compensation Baseline:  Goal status: achieved   4.  SLS 30s without compensation Baseline:  Goal status: achieved  LONG TERM GOALS:  Able to demo step up on 6" step with level pelvis and proper form Baseline:  Goal status: MET  06/03/23  2.  Will tolerate at least 3 min on elliptical, demonstrating good tolerance to repetitive weight bearing motion Baseline:  Goal status: MET  3.  Demonstrate proper form in at least 10 continuous lunges without increased pain Baseline:  Goal status: ongoing  Week 12 06/23/23  4.  Demonstrate gentle, double and single foot plyometric motions with good proximal form Baseline:  Goal status: ongoing Week 12 06/23/23      PLAN:  PT FREQUENCY: 1-2x/week  PT DURATION: 12 weeks  PLANNED INTERVENTIONS: 97164- PT Re-evaluation, 97110-Therapeutic exercises, 97530- Therapeutic activity, 97112-  Neuromuscular re-education, 97535- Self Care, 16109- Manual therapy, [phone]- Gait training, [phone]- Aquatic Therapy, Patient/Family education, Balance training, Stair training, Taping, Dry Needling, Joint mobilization, Spinal mobilization, Scar mobilization, Cryotherapy, and Moist heat.  PLAN FOR NEXT SESSION: reformer exercises to cable/resistance band  Zebedee Iba PT, DPT 06/17/23 11:48 AM

## 2023-06-19 ENCOUNTER — Other Ambulatory Visit: Payer: Self-pay

## 2023-06-19 ENCOUNTER — Other Ambulatory Visit (HOSPITAL_BASED_OUTPATIENT_CLINIC_OR_DEPARTMENT_OTHER): Payer: Self-pay

## 2023-06-19 ENCOUNTER — Ambulatory Visit: Admitting: Urgent Care

## 2023-06-19 VITALS — BP 130/87 | HR 104 | Temp 100.4°F | Wt 189.8 lb

## 2023-06-19 DIAGNOSIS — J101 Influenza due to other identified influenza virus with other respiratory manifestations: Secondary | ICD-10-CM

## 2023-06-19 LAB — POC COVID19 BINAXNOW: SARS Coronavirus 2 Ag: NEGATIVE

## 2023-06-19 LAB — POCT INFLUENZA A/B
Influenza A, POC: POSITIVE — AB
Influenza B, POC: NEGATIVE

## 2023-06-19 MED ORDER — XOFLUZA (80 MG DOSE) 1 X 80 MG PO TBPK
1.0000 | ORAL_TABLET | Freq: Once | ORAL | 0 refills | Status: AC
  Filled 2023-06-19: qty 1, 1d supply, fill #0

## 2023-06-19 MED ORDER — XOFLUZA (80 MG DOSE) 1 X 80 MG PO TBPK: 1.0000 | ORAL_TABLET | Freq: Once | ORAL | 0 refills | Status: DC

## 2023-06-19 NOTE — Addendum Note (Signed)
 Addended by: Filomena Jungling on: 06/19/2023 02:22 PM   Modules accepted: Orders

## 2023-06-19 NOTE — Progress Notes (Signed)
 Established Patient Office Visit  Subjective:  Patient ID: Valerie [name], female    DOB: 12-04-1970  Age: 53 y.o. MRN: [id number]  Chief Complaint  Patient presents with   Cough    Cough, low grade fever that started yesterday. She also had an incident with breathing in insolation. She has not tested for flu or covid.     Cough This is a new problem. The current episode started yesterday. The problem has been unchanged. The problem occurs every few minutes. The cough is Non-productive. Associated symptoms include chills, a fever, headaches, myalgias and sweats. Pertinent negatives include no chest pain, ear congestion, ear pain, hemoptysis, nasal congestion, postnasal drip, sore throat, shortness of breath or wheezing. Risk factors: pt concerned about a mouse that ate through insulation possibly contributing to her current symptoms. There is no history of asthma, COPD or emphysema.    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 Serena Croissant @ [facility].   COVID-19 11/2019   flu-like symptoms, cough, no hospitalizations    Edema    lower extremity, follows w/ [facility], Sabino Dick, [place]. See OV dated 03/24/22 in Epic.   GERD (gastroesophageal reflux disease)    Follows w/ Dr. Judeen Hammans, gastroenterologist and PCP, Dr. Maryruth [facility].   Hashimoto's disease    Follows with Dr. Ernest Haber @ [place].   History of hiatal hernia    2 cm hiatal hernia per 01/08/22 EGD   History of radiation therapy 2022   completed 01-18-2021   [facility] (hard of hearing)    has gotten bilateral aides.   Hypothyroidism    Follows w/ endocronologist, Dr. Ernest Haber @ [place].   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 Despina Arias @ [facility].   Optic neuritis    Follows w/ opthamology, Dr. Mora Appl.03/2020 right eye (almost complete blindness), 04/2020 left eye   Personal history of radiation therapy    PONV (postoperative nausea and vomiting)    Status post dilation of esophageal narrowing 01/08/2022   EGD, esophagus dilated   Wears glasses    Past Surgical History:  Procedure Laterality Date   ABDOMINAL HYSTERECTOMY  04/23/22   BIOPSY BREAST Left 10/17/2020   BREAST LUMPECTOMY     BREAST LUMPECTOMY WITH RADIOACTIVE SEED AND SENTINEL LYMPH NODE BIOPSY Left 11/15/2020   Procedure: LEFT BREAST LUMPECTOMY WITH RADIOACTIVE SEED AND LEFT SENTINEL LYMPH NODE BIOPSY;  Surgeon: Harriette Bouillon, MD;  Location: [facility];  Service: General;  Laterality: Left;   BREAST SURGERY  August 2022   Cancer surgery   COLONOSCOPY  09/06/2021   cecal polyp   CT CORONARY CA SCORING  07/13/2020   Coronary Calcium = 0 (in Epic)   CYSTOSCOPY N/A 04/23/2022   Procedure: CYSTOSCOPY;  Surgeon: Jerene Bears, MD;  Location: [facility];  Service: Gynecology;  Laterality: N/A;    ESOPHAGOGASTRODUODENOSCOPY  01/08/2022   2cm hiatal hernia, esophagus dilated   TOTAL LAPAROSCOPIC HYSTERECTOMY WITH SALPINGECTOMY Bilateral 04/23/2022   Procedure: TOTAL LAPAROSCOPIC HYSTERECTOMY BILATERAL SALPINGECTOMY, LEFT OOPHORECTOMY;  Surgeon: Jerene Bears, MD;  Location: [facility] [place];  Service: Gynecology;  Laterality: Bilateral;   TRANSTHORACIC ECHOCARDIOGRAM  11/28/2021   see results in Epic   TRANSTHORACIC ECHOCARDIOGRAM  07/05/2020   in Epic   TUBAL LIGATION  02/2012   WISDOM TOOTH EXTRACTION     early 1990's   Social History   Tobacco Use   Smoking status: Never  Vaping Use   Vaping status: Never Used  Substance Use Topics   Alcohol use: Never   Drug use: Never      ROS: as noted in HPI  Objective:     BP 130/87   Pulse (!) 104   Temp (!) 100.4 F (38 C) (Oral)   Wt 189 lb 12.8 oz (86.1 kg)   LMP 12/23/2021 (Exact Date)   SpO2 96%   BMI 31.58 kg/m  BP Readings from Last 3 Encounters:  06/19/23 130/87  05/14/23 120/62  05/06/23 122/78   Wt Readings from Last 3 Encounters:  06/19/23 189 lb 12.8 oz (86.1 kg)  05/14/23 190 lb 14.4 oz (86.6 kg)  05/06/23 189 lb (85.7 kg)      Physical Exam Vitals and nursing note reviewed.  Constitutional:      General: She is not in acute distress.    Appearance: Normal appearance. She is ill-appearing. She is not toxic-appearing or diaphoretic.  HENT:     Head: Normocephalic and atraumatic.     Right Ear: Tympanic membrane, ear canal and external ear normal. There is no impacted cerumen.     Left Ear: Tympanic membrane, ear canal and external ear normal. There is no impacted cerumen.     Nose: Nose normal. No congestion or rhinorrhea.     Mouth/Throat:     Mouth: Mucous membranes are moist.     Pharynx: Oropharynx is clear. No oropharyngeal exudate or posterior oropharyngeal erythema.  Eyes:     General: No scleral icterus.       Right eye: No discharge.        Left eye: No discharge.      Extraocular Movements: Extraocular movements intact.     Pupils: Pupils are equal, round, and reactive to light.  Cardiovascular:     Rate and Rhythm: Regular rhythm. Tachycardia present.     Pulses: Normal pulses.     Heart sounds: No murmur heard. Pulmonary:     Effort: Pulmonary effort is normal. No respiratory distress.     Breath sounds: Normal breath sounds. No stridor. No wheezing, rhonchi or rales.  Chest:     Chest wall: No tenderness.  Musculoskeletal:     Cervical back: Normal range of motion and neck supple. No rigidity or tenderness.  Lymphadenopathy:     Cervical: No cervical adenopathy.  Skin:    General: Skin is warm and dry.     Findings: No rash.  Neurological:     General: No focal deficit present.     Mental Status: She is alert and oriented to person, place, and time.     Gait: Gait normal.  Psychiatric:        Mood and Affect: Mood normal.        Behavior: Behavior normal.      Results for orders placed or performed in visit on 06/19/23  POCT Influenza A/B  Result Value Ref Range   Influenza A, POC Positive (A) Negative   Influenza B, POC Negative Negative  POC COVID-19 BinaxNow  Result Value Ref Range   SARS Coronavirus 2 Ag Negative Negative    Last CBC Lab Results  Component Value Date   WBC 5.7 05/06/2023   HGB 14.9 05/06/2023   HCT 45.1 05/06/2023   MCV 97 05/06/2023   MCH 32.1 05/06/2023   RDW 11.3 (L) 05/06/2023   PLT 213 05/06/2023   Last metabolic panel Lab Results  Component Value Date   GLUCOSE 78 05/06/2023   NA 142 05/06/2023   K 3.9 05/06/2023   CL 103 05/06/2023   CO2 25 05/06/2023   BUN 24 05/06/2023   CREATININE 1.06 (H) 05/06/2023   EGFR 63 05/06/2023   CALCIUM 9.3 05/06/2023   PROT 6.7 05/06/2023   ALBUMIN 4.2 05/06/2023   LABGLOB 2.5 05/06/2023   AGRATIO 1.8 10/21/2021   BILITOT 0.2 05/06/2023   ALKPHOS 47 05/06/2023   AST 30 05/06/2023   ALT 56 (H) 05/06/2023   ANIONGAP 5 08/29/2022      The 10-year  ASCVD risk score (Arnett DK, et al., 2019) is: 1.4%  Assessment & Plan:  Influenza A -     POCT Influenza A/B -     POC COVID-19 BinaxNow -     Xofluza (80 MG Dose); Take 1 tablet by mouth once for 1 dose.  Dispense: 1 each; Refill: 0  Pt positive for Flu A. Repeat VS showed normalization of O2. Pt mildly tachycardic. Will start xofluza as pt has had sx not for about 24 hours. Additional OTC medications discussed.    No follow-ups on file.   Maretta Bees, PA

## 2023-06-19 NOTE — Patient Instructions (Signed)
 You are positive for influenza. Please take the treatment as soon as possible - it is a single tablet. This will help prevent worsening symptoms, and shorten the course of the flu. Please alternate Tylenol and ibuprofen as needed for aches and fever. Rest.  Drink plenty of water. Consider taking over the counter oscillococcinum - this will help with fatigue and body aches. You are considered contagious until 24 hours after your fever breaks.

## 2023-06-24 ENCOUNTER — Encounter (HOSPITAL_BASED_OUTPATIENT_CLINIC_OR_DEPARTMENT_OTHER): Payer: Self-pay | Admitting: Physical Therapy

## 2023-06-24 ENCOUNTER — Ambulatory Visit (HOSPITAL_BASED_OUTPATIENT_CLINIC_OR_DEPARTMENT_OTHER): Payer: Commercial Managed Care - PPO | Admitting: Physical Therapy

## 2023-06-24 DIAGNOSIS — R262 Difficulty in walking, not elsewhere classified: Secondary | ICD-10-CM

## 2023-06-24 DIAGNOSIS — M6281 Muscle weakness (generalized): Secondary | ICD-10-CM | POA: Diagnosis not present

## 2023-06-24 DIAGNOSIS — M25551 Pain in right hip: Secondary | ICD-10-CM

## 2023-06-24 DIAGNOSIS — M25651 Stiffness of right hip, not elsewhere classified: Secondary | ICD-10-CM

## 2023-06-24 NOTE — Therapy (Addendum)
 OUTPATIENT PHYSICAL THERAPY TREATMENT   Patient Name: Valerie [name] MRN: [id number] DOB:1970/04/29, 53 y.o., female Today's Date: 06/24/2023  END OF SESSION:  PT End of Session - 06/24/23 1133     Visit Number 21    Number of Visits 35    Date for PT Re-Evaluation 09/22/23    Authorization Type UHC  no VL    PT Start Time 1100    PT Stop Time 1138    PT Time Calculation (min) 38 min    Behavior During Therapy WFL for tasks assessed/performed                 Past Medical History:  Diagnosis Date   Abnormal echocardiogram 11/28/2021   EF 60 -65%, abnormal global longitudinal strain, see cardiology OV in Epic dated 12/25/21   Breast cancer (HCC)    Cancer (HCC) 09/2020   left breast, ER+, s/p radiation therapy 12/25/20 - 01/18/21, Patient follows with Dr. Serena Croissant @ [facility].   COVID-19 11/2019   flu-like symptoms, cough, no hospitalizations   Edema    lower extremity, follows w/ [facility], Sabino Dick, [place]. See OV dated 03/24/22 in Epic.   GERD (gastroesophageal reflux disease)    Follows w/ Dr. Judeen Hammans, gastroenterologist and PCP, Dr. Maryruth [facility].   Hashimoto's disease    Follows with Dr. Ernest Haber @ [place].   History of hiatal hernia    2 cm hiatal hernia per 01/08/22 EGD   History of radiation therapy 2022   completed 01-18-2021   [facility] (hard of hearing)    has gotten bilateral aides.   Hypothyroidism    Follows w/ endocronologist, Dr. Ernest Haber @ [place].   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 Despina Arias @ [facility].   Optic neuritis    Follows w/ opthamology, Dr. Mora Appl.03/2020 right eye (almost complete blindness), 04/2020 left eye   Personal history of radiation therapy    PONV (postoperative nausea and  vomiting)    Status post dilation of esophageal narrowing 01/08/2022   EGD, esophagus dilated   Wears glasses    Past Surgical History:  Procedure Laterality Date   ABDOMINAL HYSTERECTOMY  04/23/22   BIOPSY BREAST Left 10/17/2020   BREAST LUMPECTOMY     BREAST LUMPECTOMY WITH RADIOACTIVE SEED AND SENTINEL LYMPH NODE BIOPSY Left 11/15/2020   Procedure: LEFT BREAST LUMPECTOMY WITH RADIOACTIVE SEED AND LEFT SENTINEL LYMPH NODE BIOPSY;  Surgeon: Harriette Bouillon, MD;  Location: [facility];  Service: General;  Laterality: Left;   BREAST SURGERY  August 2022   Cancer surgery   COLONOSCOPY  09/06/2021   cecal polyp   CT CORONARY CA SCORING  07/13/2020   Coronary Calcium = 0 (in Epic)   CYSTOSCOPY N/A 04/23/2022   Procedure: CYSTOSCOPY;  Surgeon: Jerene Bears, MD;  Location: [facility];  Service: Gynecology;  Laterality: N/A;   ESOPHAGOGASTRODUODENOSCOPY  01/08/2022   2cm hiatal hernia, esophagus dilated   TOTAL LAPAROSCOPIC HYSTERECTOMY WITH SALPINGECTOMY Bilateral 04/23/2022   Procedure: TOTAL LAPAROSCOPIC HYSTERECTOMY BILATERAL SALPINGECTOMY, LEFT OOPHORECTOMY;  Surgeon: Jerene Bears, MD;  Location: [facility] [place];  Service: Gynecology;  Laterality: Bilateral;   TRANSTHORACIC ECHOCARDIOGRAM  11/28/2021   see results in Epic   TRANSTHORACIC ECHOCARDIOGRAM  07/05/2020   in Epic   TUBAL LIGATION  02/2012   WISDOM TOOTH EXTRACTION     early 1990's   Patient Active Problem List   Diagnosis Date Noted   High risk medication use 05/06/2023   Genetic testing 07/04/2022   Family history of uterine cancer 06/24/2022   Local edema 02/25/2022   Hypothyroidism due to Hashimoto's thyroiditis 06/07/2021   Malignant neoplasm of lower-inner quadrant of left breast in female, estrogen receptor positive (HCC) 10/23/2020   Optic neuritis, left 10/18/2020   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) 07/10/2020   Mitral  regurgitation    Neuromyelitis optica (devic) (HCC) 05/07/2020   Hypothyroid 04/25/2020   Optic neuritis, right 04/24/2020     REFERRING PROVIDER:  Huel Cote, MD    REFERRING DIAG:  [phone] (ICD-10-CM) - Tear of right acetabular labrum, initial encounter   s/p Rt acetabular labral repair  Rationale for Evaluation and Treatment: Rehabilitation  THERAPY DIAG:  Muscle weakness (generalized) - Plan: PT plan of care cert/re-cert  Pain in right hip - Plan: PT plan of care cert/re-cert  Difficulty in walking, not elsewhere classified - Plan: PT plan of care cert/re-cert  Stiffness of right hip, not elsewhere classified - Plan: PT plan of care cert/re-cert  ONSET DATE: DOS 03/31/23   SUBJECTIVE:                                                                                                                                                                                           SUBJECTIVE STATEMENT: Pt notes the hip is tight from being sick with the flu the last few days. She was only able to walk for a few laps yesterday. Soreness has resolved. Pt is only walking 3x/laps upstairs before having pain. Pt only has pain with activity, no pain at rest.   PERTINENT HISTORY:  N/a  PAIN:  Are you having pain? no  NPRS: 0/10   PRECAUTIONS:  None  RED FLAGS: None   WEIGHT BEARING RESTRICTIONS:  No  FALLS:  Has patient fallen in last 6 months? No  LIVING ENVIRONMENT: Lives with: lives with their spouse and 2 cats Lives in: House ranch with basement (washer/dryer & garage where she parks) Stairs: Yes: Internal: 12-14 steps; on left going up and External: 4 steps; on right going up Has following equipment at home: None  OCCUPATION:  works for [employer] including lifting up 25-30#, pushing & pulling   PLOF:  Independent  PATIENT GOALS:  Back to gym, improve flexibility   OBJECTIVE:  Note: Objective measures were completed at Evaluation unless otherwise  noted.  PATIENT SURVEYS:  FOTO no longer available   Lower Extremity Functional Score: 55 / 80 = 68.8 %  AROM Right 3/12 Left 3/12  Hip flexion 120 120  Hip extension 0 0  Hip abduction    Hip adduction    Hip internal rotation 32 30  Hip external rotation 30 45  Knee flexion    Knee extension    Ankle dorsiflexion    Ankle plantarflexion    Ankle inversion    Ankle eversion     (Blank rows = not tested)    MMT Right 3/12 Left 3/12   Hip flexion 44.0 52.8  Hip extension    Hip abduction 33.0 40.7  Hip adduction    Hip internal rotation 24.0 23.7  Hip external rotation 24.2 19.7  Knee flexion    Knee extension    Ankle dorsiflexion    Ankle plantarflexion    Ankle inversion    Ankle eversion     (Blank rows = not tested) l                                                                                                                          TREATMENT DATE:  [facility] Adult PT Treatment:                                                DATE:    3/12  Mulligan lateral mob, inf, grade IV  Supine SLR 3x10 Seated lumbar flexion stretch with SB 30s 2x each side SLS on airex with star tap 3x5  HEP review, return to exercise following illness   3/5  Nustep warm up 5 min STM R posterior hip and glute med Prone fig 4 PA grade III  Supine ITB stretch 30s 3x  Child's pose with SB 10x 3s each way  Child's pose with SB 5x each way 3s holds Quadruped rock backs with IR bias 10x 2s holds DL Bosu squats 3x8 Standing hip flexor stretch with reach (foot in slight ER) 30s 3x   3/3  Upright  bike warm up 5 min Mulligan lateral mob, inf, and prone figure 4 PA grade III  Supine thomas hip flexor march no weight 3x8 Adductor bridge 3x10 (with foam roller)   Prone hip flexor stretch with half foam roll 30s 4x Bird dog 2x10 each side Deep squat with TRX assisted (focus on getting into catcher's position) 3x8   06/11/23 Pt seen for aquatic therapy today.  Treatment  took place in water 3.5-4.75 ft in depth at the [facility] pool. Temp of water was 85-91.  Pt entered/exited the pool via stairs independently with single rail. * 32 ft of front flutter kick with kick board support x 4 * 25 ft of back flutter kick x 4 * 32 ft of side stroke x 2 L/R * side to side  pendulum x 5 then front/side/back/side pendulums  * suspended knee tucks without / with hip twists * TUG like exercise in water from bench x 3  * 3 way LE stretch with ankle on hollow noodle;  quad and hip flexor stretch with ankle on hollow noodle  Pt requires the buoyancy and hydrostatic pressure of water for support, and to offload joints by unweighting joint load by at least 50 % in navel deep water and by at least 75-80% in chest to neck deep water.  Viscosity of the water is needed for resistance of strengthening. Water current perturbations provides challenge to standing balance requiring increased core activation.   06/08/23 IASTM quads Tall kneeling hinge back Half kneel glut set, UE flexion + sidebend Reformer: supine foot work double & single leg; sidelying press; short box neutral & turnout press; tall kneel shoulder ext, row, flexion  06/05/23 Pt seen for aquatic therapy today.  Treatment took place in water 3.5-4.75 ft in depth at the [facility] pool. Temp of water was 91.  Pt entered/exited the pool via stairs independently with bilat rail.  -Walking forward/ backward for 10 continuous minutes with reciprocal arm swing Side stepping then side skipping -side lunge with arm addct/abdct with yellow hand floats x 6 - 3 way LE stretch RLE, (ITB, hamstring, adductor) x 10s each, foot on solid noodle - runners step ups with quick up/ slow coming down 2 x 8 (mindful of height of leg) R/L - SLS with opp arm moving noodle under water (circles/ swing front and back)->green hand bell (good challenge) - side plank on blue hand float x 30s each with hip abd 2 x 5 R/L  (excellent execution) added top ue HB for improved control -plank with leg lifts 2 x 5. -STS from 3rd from bottom step with vc and demonstration for control.  2/19  Elliptical warm up 3 min lvl 1 (stopped due to fatigue) 6" step up and down 3x8  Shuttle SL leg press 75lbs 3x8 SLS on airex 30s 4x GTB paloff 3x10 ea Supine 90/90 3s 2x10 Figure 4 bridge 3x8 Kneeling plank 4x 10s     PATIENT EDUCATION:  Education details: Anatomy of condition, POC, HEP, exercise form/rationale Person educated: Patient Education method: Explanation, Demonstration, Tactile cues, Verbal cues, and Handouts Education comprehension: verbalized understanding, returned demonstration, verbal cues required, tactile cues required, and needs further education  HOME EXERCISE PROGRAM:  [email or web address]  Access Code: [email or web address]   Aquatic Access Code: [email or web address] URL: [email or web address] Date: 05/13/23 - updated 05/29/23 Prepared by: [facility] [address] This aquatic home exercise program from MedBridge utilizes pictures from land based exercises, but has been adapted prior to lamination and issuance.    ASSESSMENT:  CLINICAL IMPRESSION:  Patient is 12 weeks postop at this time.  Patient demonstrates a nearly full range of motion on surgical versus nonsurgical side.  However, patient's right hip continues to be stiff causing discomfort with exercise and community ambulation.  Patient strength testing does demonstrate at least 80% of left versus right.  Plan to continue with progression of muscle endurance and right hip mobility for full return to walking community distances as patient is still currently limited to less than a third of a mile.  Given patient's recent illness endurance exercise/cardiovascular exercise held.  Plan to reintroduce for neck session and return in 2 gym-based strengthening and endurance.. Pt would benefit from continued skilled therapy in order to  reach goals and maximize functional R LE strength and  ROM for full return to PLOF.   REHAB POTENTIAL: Good  CLINICAL DECISION MAKING: Stable/uncomplicated  EVALUATION COMPLEXITY: Low   GOALS: Goals reviewed with patient? Yes  SHORT TERM GOALS: Target date: 4 weeks 04/28/23  Pain free flexion to 90 Baseline: Goal status: achieved   2.  Demo controlled quad set with hold Baseline:  Goal status: Achieved   3.  30s sit to stand without pain or compensation Baseline:  Goal status: achieved   4.  SLS 30s without compensation Baseline:  Goal status: achieved  LONG TERM GOALS:  Able to demo step up on 6" step with level pelvis and proper form Baseline:  Goal status: MET  06/03/23  2.  Will tolerate at least 3 min on elliptical, demonstrating good tolerance to repetitive weight bearing motion Baseline:  Goal status: MET  3.  Demonstrate proper form in at least 10 continuous lunges without increased pain Baseline:  Goal status: ongoing  Week 12 06/23/23  4.  Demonstrate gentle, double and single foot plyometric motions with good proximal form Baseline:  Goal status: ongoing Week 12 06/23/23      PLAN:  PT FREQUENCY: 1-2x/week  PT DURATION: 12 weeks  PLANNED INTERVENTIONS: 97164- PT Re-evaluation, 97110-Therapeutic exercises, 97530- Therapeutic activity, 97112- Neuromuscular re-education, 97535- Self Care, 16109- Manual therapy, [phone]- Gait training, [phone]- Aquatic Therapy, Patient/Family education, Balance training, Stair training, Taping, Dry Needling, Joint mobilization, Spinal mobilization, Scar mobilization, Cryotherapy, and Moist heat.  PLAN FOR NEXT SESSION: reformer exercises to cable/resistance band  Zebedee Iba PT, DPT 06/24/23 12:03 PM

## 2023-06-25 ENCOUNTER — Ambulatory Visit: Admitting: Student in an Organized Health Care Education/Training Program

## 2023-06-25 ENCOUNTER — Encounter: Payer: Self-pay | Admitting: Student in an Organized Health Care Education/Training Program

## 2023-06-25 VITALS — BP 126/80 | HR 83 | Temp 97.7°F | Wt 189.0 lb

## 2023-06-25 DIAGNOSIS — J101 Influenza due to other identified influenza virus with other respiratory manifestations: Secondary | ICD-10-CM | POA: Diagnosis not present

## 2023-06-25 MED ORDER — GUAIFENESIN-CODEINE 100-10 MG/5ML PO SOLN: 5.0000 mL | Freq: Every evening | ORAL | 0 refills | Status: DC

## 2023-06-25 NOTE — Progress Notes (Signed)
   Acute Office Visit  Subjective:     Patient ID: Valerie [name], female    DOB: 1970-05-29, 52 y.o.   MRN: [id number]  Chief Complaint  Patient presents with   Cough    Cough or a week and did test positive for Flu A and did get flu medication prescribed. Has not felt better, still coughing a lot. Fever on Friday but none since.     HPI  Patient is in today for follow-up of influenza A infection.  Diagnosed last week, treated with Xofluza.  Fevers have resolved.  He will having persistent nonproductive cough that keeps her awake at night.  Feeling very tired during the day.  Unable to return to work this week due to generalized fatigue.  No chest pain, no dyspnea with exertion.  Eating and drinking well.  No abdominal pain or vomiting.  No diarrhea.       Objective:    BP 126/80   Pulse 83   Temp 97.7 F (36.5 C) (Temporal)   Wt 189 lb (85.7 kg)   LMP 12/23/2021 (Exact Date)   SpO2 96%   BMI 31.45 kg/m    Physical Exam  Gen: Well-appearing woman Ears: Normal tympanic membranes bilaterally Mouth: Mild posterior oropharynx erythema, no exudate CV: Regular, no murmur Lungs: Unlabored, clear to auscultation throughout with no crackles Ext: Warm, no edema      Assessment & Plan:   Problem List Items Addressed This Visit       Unprioritized   Influenza A - Primary   Acute problem.  Influenza A infection diagnosed on 3/7 treated with Xofluza.  Person is immunosuppressed.  Slow to recover and has a persistent nonproductive cough along with fatigue.  Fevers are resolved.  Eating and drinking well.  No signs or symptoms to suggest pneumonia or sinusitis.  I do not think antibiotics would be helpful right now.  Plan to continue supportive care.  Nocturnal cough is a big issue right now.  Will prescribe guaifenesin/codeine to be used at night.  I gave precautions about drowsiness.      Relevant Medications   guaiFENesin-codeine 100-10 MG/5ML syrup    Meds  ordered this encounter  Medications   guaiFENesin-codeine 100-10 MG/5ML syrup    Sig: Take 5 mLs by mouth at bedtime.    Dispense:  120 mL    Refill:  0    No follow-ups on file.  Tyson Alias, MD

## 2023-06-25 NOTE — Assessment & Plan Note (Signed)
 Acute problem.  Influenza A infection diagnosed on 3/7 treated with Xofluza.  Person is immunosuppressed.  Slow to recover and has a persistent nonproductive cough along with fatigue.  Fevers are resolved.  Eating and drinking well.  No signs or symptoms to suggest pneumonia or sinusitis.  I do not think antibiotics would be helpful right now.  Plan to continue supportive care.  Nocturnal cough is a big issue right now.  Will prescribe guaifenesin/codeine to be used at night.  I gave precautions about drowsiness.

## 2023-06-29 ENCOUNTER — Encounter: Payer: Self-pay | Admitting: Internal Medicine

## 2023-06-29 ENCOUNTER — Ambulatory Visit: Payer: Commercial Managed Care - PPO | Admitting: Internal Medicine

## 2023-06-29 VITALS — BP 118/80 | HR 80 | Wt 190.0 lb

## 2023-06-29 DIAGNOSIS — R1310 Dysphagia, unspecified: Secondary | ICD-10-CM

## 2023-06-29 DIAGNOSIS — K449 Diaphragmatic hernia without obstruction or gangrene: Secondary | ICD-10-CM

## 2023-06-29 DIAGNOSIS — R0989 Other specified symptoms and signs involving the circulatory and respiratory systems: Secondary | ICD-10-CM | POA: Diagnosis not present

## 2023-06-29 DIAGNOSIS — K219 Gastro-esophageal reflux disease without esophagitis: Secondary | ICD-10-CM | POA: Diagnosis not present

## 2023-06-29 DIAGNOSIS — R7989 Other specified abnormal findings of blood chemistry: Secondary | ICD-10-CM | POA: Diagnosis not present

## 2023-06-29 DIAGNOSIS — Z8719 Personal history of other diseases of the digestive system: Secondary | ICD-10-CM

## 2023-06-29 MED ORDER — OMEPRAZOLE 40 MG PO CPDR: 40.0000 mg | DELAYED_RELEASE_CAPSULE | Freq: Every day | ORAL | 3 refills | Status: DC

## 2023-06-29 NOTE — Patient Instructions (Addendum)
 We have sent the following medications to your pharmacy for you to pick up at your convenience: Omeprazole 40mg  daily.  A referral was sent to Allergy and Asthma, they will give you a call to schedule an appointment.  _______________________________________________________  If your blood pressure at your visit was 140/90 or greater, please contact your primary care physician to follow up on this.  _______________________________________________________  If you are age 23 or older, your body mass index should be between 23-30. Your Body mass index is 31.62 kg/m. If this is out of the aforementioned range listed, please consider follow up with your Primary Care Provider.  If you are age 45 or younger, your body mass index should be between 19-25. Your Body mass index is 31.62 kg/m. If this is out of the aformentioned range listed, please consider follow up with your Primary Care Provider.   ________________________________________________________  The [facility] providers would like to encourage you to use [facility] to communicate with providers for non-urgent requests or questions.  Due to long hold times on the telephone, sending your provider a message by [facility] may be a faster and more efficient way to get a response.  Please allow 48 business hours for a response.  Please remember that this is for non-urgent requests.  _______________________________________________________

## 2023-06-29 NOTE — Progress Notes (Signed)
 Chief Complaint:  GERD, hiatal hernia   Assessment &  Plan   GERD Hiatal hernia History of esophageal stricture Throat clearing Mildly elevated LFTs Patient's GERD is best controlled on 40 mg every day. She was not able to tolerate decreasing her dosage to 20 mg every day. She still has issues with throat clearing, which are not benefiting from PPI therapy. I do wonder if her throat clearing may be related to post-nasal drip or seasonal allergies. Will refer her to allergy to see if they can perform some medical optimization and hopefully help with her throat clearing. Her recent labs did show mildly elevated LFTs, which may be related to some of her medications versus mild fatty liver.  Refill omeprazole 40 mg every day. Referral to allergist Next colonoscopy due in 2028 RTC in 1 year   HPI   Valerie [name] is a 53 y.o. female with history of hypothyroidism, GERD, breast cancer, sessile serrated colon polyp, myelin oligodendrocyte glycoprotein antibody disorder presents with follow up of GERD and hiatal hernia  Interval History: As of 10 days ago, she had the flu and has continued to have cough. She tried to decrease her omeprazole from 40 mg to 20 mg QD, but the acid reflux came back. Her concern is the throat clearing and a tickle in her throat, which has been persistent for a long time. The omeprazole doesn't seem to help with the throat clearing. Denies dysphagia currently. Denies ab pain. Bowel habits are normal.   Labs 01/2022: TSH with elevated 8.75. FT4 nml.   Labs 04/2023: CBC unremarkable. CMP with mildly elevated Cr of 1.06 and mildly elevated ALT of 56. TSH nml.   Colonoscopy 09/06/21: - The examined portion of the ileum was normal. - Granular mucosa at the ileocecal valve. Biopsied. - One 6 mm polyp in the cecum, removed with a cold snare. Resected and retrieved. - Non-bleeding internal hemorrhoids. Path: 1. Surgical [P], colon, cecum, polyp (1) SESSILE  SERRATED POLYP WITHOUT CYTOLOGIC DYSPLASIA 2. Surgical [P], small bowel, ileocecal valve BENIGN ILEOCOLONIC JUNCTIONAL TYPE MUCOSA WITHOUT DIAGNOSTIC ABNORMALITY  EGD 01/08/22: - Benign-appearing esophageal stenosis. Dilated. Biopsied. - 2 cm hiatal hernia. - Gastritis. Biopsied. - Normal examined duodenum. Path: 1. Surgical [P], gastric REACTIVE GASTROPATHY WITH MINIMAL CHRONIC GASTRITIS NEGATIVE FOR H. PYLORI, INTESTINAL METAPLASIA, DYSPLASIA AND CARCINOMA 2. Surgical [P], esophageal stricture REACTIVE SQUAMOUS MUCOSA WITH FOCAL EROSION MILD CHRONIC GASTRITIS NEGATIVE FOR INTESTINAL METAPLASIA, DYSPLASIA AND CARCINOMA (SEE MICROSCOPIC COMMENT) 3. Surgical [P], esophageal REFLUX ESOPHAGITIS (5 EOS/HIGH POWER FIELD) NEGATIVE FOR GLANDULAR EPITHELIUM, DYSPLASIA AND CARCINOMA Microscopic Comment 2. Sections of the esophageal stricture show multiple fragments of squamous mucosa and a fragment of squama gastric junctional mucosa. The squamous epithelium shows reactive changes including elongation papillae and basal zone hyperplasia with a patchy mononuclear cell infiltrate. At the squamogastric junction the squamous epithelium is eroded. The scant cardia type gastric mucosa shows a chronic inflammatory cell infiltrate including plasma cells. There is no evidence of intestinal metaplasia, dysplasia or carcinoma.  Labs:     Latest Ref Rng & Units 05/06/2023    4:38 PM 10/22/2022    8:51 AM 08/29/2022    9:45 AM  CBC  WBC 3.4 - 10.8 x10E3/uL 5.7  4.7  5.5   Hemoglobin 11.1 - 15.9 g/dL 40.9  81.1  91.4   Hematocrit 34.0 - 46.6 % 45.1  44.1  44.2   Platelets 150 - 450 x10E3/uL 213  174  182  Latest Ref Rng & Units 05/06/2023    4:38 PM 10/22/2022    8:51 AM 08/29/2022    9:45 AM  Hepatic Function  Total Protein 6.0 - 8.5 g/dL 6.7  6.1  6.1   Albumin 3.8 - 4.9 g/dL 4.2  4.0  3.8   AST 0 - 40 IU/L 30  21  19    ALT 0 - 32 IU/L 56  28  27   Alk Phosphatase 44 - 121 IU/L 47  44  35    Total Bilirubin 0.0 - 1.2 mg/dL 0.2  0.3  0.4       Current Outpatient Medications  Medication Sig Dispense Refill   ACTEMRA 162 MG/0.9ML SOSY Inject 0.9 mLs (162 mg total) into the skin once a week. For MOGAD Takes weekly on Saturday. 10.8 mL 1   ascorbic acid (VITAMIN C) 1000 MG tablet Take 1 tablet by mouth daily.     cholecalciferol (VITAMIN D3) 25 MCG (1000 UNIT) tablet Take 5,000 Units by mouth daily.     furosemide (LASIX) 20 MG tablet TAKE 1 TABLET BY MOUTH DAILY 90 tablet 3   guaiFENesin-codeine 100-10 MG/5ML syrup Take 5 mLs by mouth at bedtime. 120 mL 0   levothyroxine (SYNTHROID) 88 MCG tablet Take by mouth 88 mcg six days a week 75 tablet 3   MAGNESIUM GLUCONATE PO Take 2 tablets by mouth daily at 12 noon.     omeprazole (PRILOSEC) 20 MG capsule TAKE 1 CAPSULE BY MOUTH DAILY 90 capsule 0   Probiotic Product (CULTRELLE KIDS IMMUNE DEFENSE PO) Take 1 tablet by mouth daily.     UNABLE TO FIND Take 4 tablets by mouth daily. Med Name: Omega Pure 2 tablet in AM and 1 tablet in PM     UNABLE TO FIND Take by mouth daily. Med Name: Ultra Inflamix 2 scoops daily     UNABLE TO FIND Take 2 tablets by mouth daily. Med Name: R-Lipoic Acid     No current facility-administered medications for this visit.    Physical Exam  Wt Readings from Last 3 Encounters:  06/29/23 190 lb (86.2 kg)  06/25/23 189 lb (85.7 kg)  06/19/23 189 lb 12.8 oz (86.1 kg)    Wt 190 lb (86.2 kg)   LMP 12/23/2021 (Exact Date)   BMI 31.62 kg/m  Constitutional:  Generally well appearing female in no acute distress. Psychiatric: Pleasant. Normal mood and affect. Behavior is normal. EENT: Pupils normal.  Conjunctivae are normal. No scleral icterus. No candida seen Neck supple.  Cardiovascular: Normal rate, regular rhythm. No edema Pulmonary/chest: Effort normal and breath sounds normal. No wheezing, rales or rhonchi. Abdominal: Soft, nondistended, nontender. Bowel sounds active throughout. There are no masses  palpable. No hepatomegaly. Neurological: Alert and oriented to person place and time. Skin: Skin is warm and dry. No rashes noted.  Imogene Burn, MD  06/29/2023, 3:22 PM  I spent 36 minutes of time, including in depth chart review, independent review of results as outlined above, communicating results with the patient directly, face-to-face time with the patient, coordinating care, and ordering studies and medications as appropriate, and documentation.

## 2023-06-30 ENCOUNTER — Encounter: Payer: Self-pay | Admitting: Family Medicine

## 2023-07-01 ENCOUNTER — Ambulatory Visit (INDEPENDENT_AMBULATORY_CARE_PROVIDER_SITE_OTHER): Payer: Commercial Managed Care - PPO | Admitting: Orthopaedic Surgery

## 2023-07-01 ENCOUNTER — Other Ambulatory Visit: Payer: Self-pay | Admitting: *Deleted

## 2023-07-01 DIAGNOSIS — M1990 Unspecified osteoarthritis, unspecified site: Secondary | ICD-10-CM

## 2023-07-01 DIAGNOSIS — E669 Obesity, unspecified: Secondary | ICD-10-CM

## 2023-07-01 DIAGNOSIS — S73191A Other sprain of right hip, initial encounter: Secondary | ICD-10-CM

## 2023-07-01 NOTE — Progress Notes (Signed)
 Post Operative Evaluation    Procedure/Date of Surgery: Right hip labral repair 12/17  Interval History:     Presents today 12 weeks status post above procedure.  Overall she is doing extremely well.  She is having some back tightness today.  She is recently unfortunately had the flu which is limiting her from exercising   PMH/PSH/Family History/Social History/Meds/Allergies:    Past Medical History:  Diagnosis Date   Abnormal echocardiogram 11/28/2021   EF 60 -65%, abnormal global longitudinal strain, see cardiology OV in Epic dated 12/25/21   Breast cancer (HCC)    Cancer (HCC) 09/2020   left breast, ER+, s/p radiation therapy 12/25/20 - 01/18/21, Patient follows with Dr. Serena Croissant @ [facility].   COVID-19 11/2019   flu-like symptoms, cough, no hospitalizations   Edema    lower extremity, follows w/ [facility], Sabino Dick, [place]. See OV dated 03/24/22 in Epic.   GERD (gastroesophageal reflux disease)    Follows w/ Dr. Judeen Hammans, gastroenterologist and PCP, Dr. Maryruth Hancock [facility] @ [facility].   Hashimoto's disease    Follows with Dr. Ernest Haber @ [place].   History of hiatal hernia    2 cm hiatal hernia per 01/08/22 EGD   History of radiation therapy 2022   completed 01-18-2021   [facility] (hard of hearing)    has gotten bilateral aides.   Hypothyroidism    Follows w/ endocronologist, Dr. Ernest Haber @ [place].   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 Despina Arias @ [facility].   Optic neuritis    Follows w/ opthamology, Dr. Mora Appl.03/2020 right eye (almost complete blindness), 04/2020 left eye   Personal history of radiation therapy    PONV (postoperative nausea and vomiting)    Status post dilation of esophageal narrowing 01/08/2022    EGD, esophagus dilated   Wears glasses    Past Surgical History:  Procedure Laterality Date   ABDOMINAL HYSTERECTOMY  04/23/22   BIOPSY BREAST Left 10/17/2020   BREAST LUMPECTOMY     BREAST LUMPECTOMY WITH RADIOACTIVE SEED AND SENTINEL LYMPH NODE BIOPSY Left 11/15/2020   Procedure: LEFT BREAST LUMPECTOMY WITH RADIOACTIVE SEED AND LEFT SENTINEL LYMPH NODE BIOPSY;  Surgeon: Harriette Bouillon, MD;  Location: [facility];  Service: General;  Laterality: Left;   BREAST SURGERY  August 2022   Cancer surgery   COLONOSCOPY  09/06/2021   cecal polyp   CT CORONARY CA SCORING  07/13/2020   Coronary Calcium = 0 (in Epic)   CYSTOSCOPY N/A 04/23/2022   Procedure: CYSTOSCOPY;  Surgeon: Jerene Bears, MD;  Location: [facility];  Service: Gynecology;  Laterality: N/A;   ESOPHAGOGASTRODUODENOSCOPY  01/08/2022   2cm hiatal hernia, esophagus dilated   TOTAL LAPAROSCOPIC HYSTERECTOMY WITH SALPINGECTOMY Bilateral 04/23/2022   Procedure: TOTAL LAPAROSCOPIC HYSTERECTOMY BILATERAL SALPINGECTOMY, LEFT OOPHORECTOMY;  Surgeon: Jerene Bears, MD;  Location: [facility];  Service: Gynecology;  Laterality: Bilateral;   TRANSTHORACIC ECHOCARDIOGRAM  11/28/2021   see results in Epic   TRANSTHORACIC ECHOCARDIOGRAM  07/05/2020   in Epic   TUBAL LIGATION  02/2012   WISDOM TOOTH  EXTRACTION     early 1990's   Social History   Socioeconomic History   Marital status: Married    Spouse name: Not on file   Number of children: 2   Years of education: BS   Highest education level: Bachelor's degree (e.g., BA, AB, BS)  Occupational History   Occupation: Psychologist, occupational   Occupation: Shipping and receiving clerk  Tobacco Use   Smoking status: Never   Smokeless tobacco: Not on file  Vaping Use   Vaping status: Never Used  Substance and Sexual Activity   Alcohol use: Never   Drug use: Never   Sexual activity: Yes    Birth control/protection: Surgical, None    Comment:  hysterectomy  Other Topics Concern   Not on file  Social History Narrative   Right handed    Caffeine use: rare   Social Drivers of Health   Financial Resource Strain: Low Risk  (06/19/2023)   Overall Financial Resource Strain (CARDIA)    Difficulty of Paying Living Expenses: Not hard at all  Food Insecurity: No Food Insecurity (06/19/2023)   Hunger Vital Sign    Worried About Running Out of Food in the Last Year: Never true    Ran Out of Food in the Last Year: Never true  Transportation Needs: No Transportation Needs (06/19/2023)   PRAPARE - Administrator, Civil Service (Medical): No    Lack of Transportation (Non-Medical): No  Physical Activity: Sufficiently Active (06/19/2023)   Exercise Vital Sign    Days of Exercise per Week: 5 days    Minutes of Exercise per Session: 50 min  Stress: No Stress Concern Present (06/19/2023)   [employer] of Occupational Health - Occupational Stress Questionnaire    Feeling of Stress : Only a little  Social Connections: Socially Integrated (06/19/2023)   Social Connection and Isolation Panel [NHANES]    Frequency of Communication with Friends and Family: More than three times a week    Frequency of Social Gatherings with Friends and Family: Once a week    Attends Religious Services: More than 4 times per year    Active Member of [employer] or Organizations: Yes    Attends Engineer, structural: More than 4 times per year    Marital Status: Married   Family History  Problem Relation Age of Onset   Hearing loss Mother    Thyroid disease Mother    Cancer Mother 6       endometrial cancer, lynch negative   GER disease Mother    Other Mother        uterine fibroids, optic disc abnormality    Eczema Mother    Colon polyps Mother    Hypothyroidism Mother    Osteoarthritis Mother    Hypertension Mother    Migraines Mother    Mitral valve prolapse Father        regurgitation/prolase   Colon polyps Father    Hearing loss Father     Other Father        Colon adenoma   Hyperlipidemia Father    GER disease Father    Heart murmur Father    Amblyopia Father        Right eye   Neuropathy Father    Aortic aneurysm Father    Heart murmur Brother    Glaucoma Maternal Grandmother    Macular degeneration Maternal Grandmother    Arthritis Maternal Grandmother    Arthritis Maternal Grandfather    Multiple sclerosis  Paternal Grandmother    Heart murmur Paternal Grandfather    Heart disease Paternal Grandfather    Diabetes Maternal Aunt    Heart murmur Paternal Aunt    Breast cancer Neg Hx    Colon cancer Neg Hx    Esophageal cancer Neg Hx    Rectal cancer Neg Hx    Stomach cancer Neg Hx    No Known Allergies Current Outpatient Medications  Medication Sig Dispense Refill   ACTEMRA 162 MG/0.9ML SOSY Inject 0.9 mLs (162 mg total) into the skin once a week. For MOGAD Takes weekly on Saturday. 10.8 mL 1   ascorbic acid (VITAMIN C) 1000 MG tablet Take 1 tablet by mouth daily.     cholecalciferol (VITAMIN D3) 25 MCG (1000 UNIT) tablet Take 5,000 Units by mouth daily.     furosemide (LASIX) 20 MG tablet TAKE 1 TABLET BY MOUTH DAILY 90 tablet 3   guaiFENesin-codeine 100-10 MG/5ML syrup Take 5 mLs by mouth at bedtime. 120 mL 0   levothyroxine (SYNTHROID) 88 MCG tablet Take by mouth 88 mcg six days a week 75 tablet 3   MAGNESIUM GLUCONATE PO Take 2 tablets by mouth daily at 12 noon.     omeprazole (PRILOSEC) 40 MG capsule Take 1 capsule (40 mg total) by mouth daily. 90 capsule 3   Probiotic Product (CULTRELLE KIDS IMMUNE DEFENSE PO) Take 1 tablet by mouth daily.     UNABLE TO FIND Take 4 tablets by mouth daily. Med Name: Omega Pure 2 tablet in AM and 1 tablet in PM     UNABLE TO FIND Take by mouth daily. Med Name: Ultra Inflamix 2 scoops daily     UNABLE TO FIND Take 2 tablets by mouth daily. Med Name: R-Lipoic Acid     No current facility-administered medications for this visit.   No results found.  Review of  Systems:   A ROS was performed including pertinent positives and negatives as documented in the HPI.   Musculoskeletal Exam:    Last menstrual period 12/23/2021.  Right hip incisions are well-appearing without erythema or drainage.  30 degrees internal/external rotation of the right hip.  Walks with mild antalgic gait.  Distal neurosensory exam is intact  Imaging:      I personally reviewed and interpreted the radiographs.   Assessment:   12 weeks status post right hip labral repair.  She is continuing to improve.  She is having somewhat of a setback and tightness as a result of the flu although this is overall mild.  She is having some back pain gave her some recommendations including inversion table, stretching, core strengthening program.  I will plan to see her back as needed Plan :    -Return to clinic as needed      I personally saw and evaluated the patient, and participated in the management and treatment plan.  Huel Cote, MD Attending Physician, Orthopedic Surgery  This document was dictated using Dragon voice recognition software. A reasonable attempt at proof reading has been made to minimize errors.

## 2023-07-02 ENCOUNTER — Encounter (HOSPITAL_BASED_OUTPATIENT_CLINIC_OR_DEPARTMENT_OTHER): Payer: Self-pay | Admitting: Physical Therapy

## 2023-07-02 ENCOUNTER — Ambulatory Visit (HOSPITAL_BASED_OUTPATIENT_CLINIC_OR_DEPARTMENT_OTHER): Payer: Commercial Managed Care - PPO | Admitting: Physical Therapy

## 2023-07-02 DIAGNOSIS — M25551 Pain in right hip: Secondary | ICD-10-CM

## 2023-07-02 DIAGNOSIS — R262 Difficulty in walking, not elsewhere classified: Secondary | ICD-10-CM

## 2023-07-02 DIAGNOSIS — M6281 Muscle weakness (generalized): Secondary | ICD-10-CM

## 2023-07-02 DIAGNOSIS — M25651 Stiffness of right hip, not elsewhere classified: Secondary | ICD-10-CM

## 2023-07-02 NOTE — Therapy (Signed)
 OUTPATIENT PHYSICAL THERAPY TREATMENT   Patient Name: Valerie [name] MRN: [id number] DOB:1970-10-13, 53 y.o., female Today's Date: 07/02/2023  END OF SESSION:  PT End of Session - 07/02/23 0733     Visit Number 22    Number of Visits 35    Date for PT Re-Evaluation 09/22/23    Authorization Type UHC  no VL    PT Start Time 0716    PT Stop Time 0755    PT Time Calculation (min) 39 min                 Past Medical History:  Diagnosis Date   Abnormal echocardiogram 11/28/2021   EF 60 -65%, abnormal global longitudinal strain, see cardiology OV in Epic dated 12/25/21   Breast cancer (HCC)    Cancer (HCC) 09/2020   left breast, ER+, s/p radiation therapy 12/25/20 - 01/18/21, Patient follows with Dr. Serena Croissant @ [facility].   COVID-19 11/2019   flu-like symptoms, cough, no hospitalizations   Edema    lower extremity, follows w/ [facility], Sabino Dick, [place]. See OV dated 03/24/22 in Epic.   GERD (gastroesophageal reflux disease)    Follows w/ Dr. Judeen Hammans, gastroenterologist and PCP, Dr. Maryruth Hancock [facility] @ [facility].   Hashimoto's disease    Follows with Dr. Ernest Haber @ [place].   History of hiatal hernia    2 cm hiatal hernia per 01/08/22 EGD   History of radiation therapy 2022   completed 01-18-2021   [facility] (hard of hearing)    has gotten bilateral aides.   Hypothyroidism    Follows w/ endocronologist, Dr. Ernest Haber @ [place].   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 Despina Arias @ [facility].   Optic neuritis    Follows w/ opthamology, Dr. Mora Appl.03/2020 right eye (almost complete blindness), 04/2020 left eye   Personal history of radiation therapy    PONV (postoperative nausea and vomiting)    Status post dilation of esophageal narrowing 01/08/2022    EGD, esophagus dilated   Wears glasses    Past Surgical History:  Procedure Laterality Date   ABDOMINAL HYSTERECTOMY  04/23/22   BIOPSY BREAST Left 10/17/2020   BREAST LUMPECTOMY     BREAST LUMPECTOMY WITH RADIOACTIVE SEED AND SENTINEL LYMPH NODE BIOPSY Left 11/15/2020   Procedure: LEFT BREAST LUMPECTOMY WITH RADIOACTIVE SEED AND LEFT SENTINEL LYMPH NODE BIOPSY;  Surgeon: Harriette Bouillon, MD;  Location: [facility];  Service: General;  Laterality: Left;   BREAST SURGERY  August 2022   Cancer surgery   COLONOSCOPY  09/06/2021   cecal polyp   CT CORONARY CA SCORING  07/13/2020   Coronary Calcium = 0 (in Epic)   CYSTOSCOPY N/A 04/23/2022   Procedure: CYSTOSCOPY;  Surgeon: Jerene Bears, MD;  Location: [facility];  Service: Gynecology;  Laterality: N/A;   ESOPHAGOGASTRODUODENOSCOPY  01/08/2022   2cm hiatal hernia, esophagus dilated   TOTAL LAPAROSCOPIC HYSTERECTOMY WITH SALPINGECTOMY Bilateral 04/23/2022   Procedure: TOTAL LAPAROSCOPIC HYSTERECTOMY BILATERAL SALPINGECTOMY, LEFT OOPHORECTOMY;  Surgeon: Jerene Bears, MD;  Location: [facility];  Service: Gynecology;  Laterality: Bilateral;   TRANSTHORACIC ECHOCARDIOGRAM  11/28/2021   see results in Epic   TRANSTHORACIC ECHOCARDIOGRAM  07/05/2020   in Epic   TUBAL LIGATION  02/2012   WISDOM TOOTH EXTRACTION     early 1990's   Patient Active Problem List   Diagnosis Date Noted   Influenza A 06/25/2023   High risk medication use 05/06/2023   Genetic testing 07/04/2022   Family history of uterine cancer 06/24/2022   Local edema 02/25/2022   Hypothyroidism due to Hashimoto's thyroiditis 06/07/2021   Malignant neoplasm of lower-inner quadrant of left breast in female, estrogen receptor positive (HCC) 10/23/2020   Optic neuritis, left 10/18/2020   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) 07/10/2020   Mitral regurgitation    Neuromyelitis optica (devic) (HCC)  05/07/2020   Hypothyroid 04/25/2020   Optic neuritis, right 04/24/2020     REFERRING PROVIDER:  Huel Cote, MD    REFERRING DIAG:  [phone] (ICD-10-CM) - Tear of right acetabular labrum, initial encounter   s/p Rt acetabular labral repair  Rationale for Evaluation and Treatment: Rehabilitation  THERAPY DIAG:  Muscle weakness (generalized)  Pain in right hip  Difficulty in walking, not elsewhere classified  Stiffness of right hip, not elsewhere classified  ONSET DATE: DOS 03/31/23   SUBJECTIVE:                                                                                                                                                                                           SUBJECTIVE STATEMENT: Pt she was able to complete 5 laps around track (2, stretched, 3 more).  Not fully recovered from flu.   PERTINENT HISTORY:  N/a  PAIN:  Are you having pain? no  NPRS: 0/10   PRECAUTIONS:  None  RED FLAGS: None   WEIGHT BEARING RESTRICTIONS:  No  FALLS:  Has patient fallen in last 6 months? No  LIVING ENVIRONMENT: Lives with: lives with their spouse and 2 cats Lives in: House ranch with basement (washer/dryer & garage where she parks) Stairs: Yes: Internal: 12-14 steps; on left going up and External: 4 steps; on right going up Has following equipment at home: None  OCCUPATION:  works for [employer] including lifting up 25-30#, pushing & pulling   PLOF:  Independent  PATIENT GOALS:  Back to gym, improve flexibility   OBJECTIVE:  Note: Objective measures were completed at Evaluation unless otherwise noted.  PATIENT SURVEYS:  FOTO no longer available   Lower Extremity Functional Score: 55 / 80 = 68.8 %  AROM Right 3/12 Left 3/12  Hip flexion 120 120  Hip extension 0 0  Hip abduction    Hip adduction    Hip internal rotation 32 30  Hip external rotation 30 45  Knee flexion    Knee extension  Ankle dorsiflexion    Ankle  plantarflexion    Ankle inversion    Ankle eversion     (Blank rows = not tested)    MMT Right 3/12 Left 3/12   Hip flexion 44.0 52.8  Hip extension    Hip abduction 33.0 40.7  Hip adduction    Hip internal rotation 24.0 23.7  Hip external rotation 24.2 19.7  Knee flexion    Knee extension    Ankle dorsiflexion    Ankle plantarflexion    Ankle inversion    Ankle eversion     (Blank rows = not tested) l                                                                                                                          TREATMENT DATE:  [facility] Adult PT Treatment:                                                DATE:  3/20 (Pt warmed up on Elliptical 5 min prior to session)  Therapeutic Exercise: Standing quad stretch x 30s x 2 Long sitting SLR x 10 each LE -> SLR to/from hip abdct x 5 each LE Seated lumbar flexion stretch with UE on pball, then SB 15s 2x each side R hip flexor stretch x 15s x 2 (2nd rep with RUE overhead reach) Manual Therapy: STM to Rt quad, adductors to decrease fascial restrictions Neuromuscular re-ed: R forward/lateral step ups and 3 sec SLS on bosu x 10 Single leg forward UE reach to chair seat x 5 each LE Therapeutic Activity: Runners step ups x 10 each, intermittent UE to steady,  working on controlled descent when on RLE.  Self Care: Instructed on use of roller stick to Rt thigh; returned demo with cues    3/12  Mulligan lateral mob, inf, grade IV  Supine SLR 3x10 Seated lumbar flexion stretch with SB 30s 2x each side SLS on airex with star tap 3x5  HEP review, return to exercise following illness   3/5  Nustep warm up 5 min STM R posterior hip and glute med Prone fig 4 PA grade III  Supine ITB stretch 30s 3x  Child's pose with SB 10x 3s each way  Child's pose with SB 5x each way 3s holds Quadruped rock backs with IR bias 10x 2s holds DL Bosu squats 3x8 Standing hip flexor stretch with reach (foot in slight ER) 30s  3x   3/3  Upright  bike warm up 5 min Mulligan lateral mob, inf, and prone figure 4 PA grade III  Supine thomas hip flexor march no weight 3x8 Adductor bridge 3x10 (with foam roller)   Prone hip flexor stretch with half foam roll 30s 4x Bird dog 2x10 each side Deep squat with TRX assisted (focus on getting into catcher's position) 3x8  06/11/23 Pt seen for aquatic therapy today.  Treatment took place in water 3.5-4.75 ft in depth at the [facility] pool. Temp of water was 85-91.  Pt entered/exited the pool via stairs independently with single rail. * 32 ft of front flutter kick with kick board support x 4 * 25 ft of back flutter kick x 4 * 32 ft of side stroke x 2 L/R * side to side pendulum x 5 then front/side/back/side pendulums  * suspended knee tucks without / with hip twists * TUG like exercise in water from bench x 3  * 3 way LE stretch with ankle on hollow noodle;  quad and hip flexor stretch with ankle on hollow noodle  Pt requires the buoyancy and hydrostatic pressure of water for support, and to offload joints by unweighting joint load by at least 50 % in navel deep water and by at least 75-80% in chest to neck deep water.  Viscosity of the water is needed for resistance of strengthening. Water current perturbations provides challenge to standing balance requiring increased core activation.   06/08/23 IASTM quads Tall kneeling hinge back Half kneel glut set, UE flexion + sidebend Reformer: supine foot work double & single leg; sidelying press; short box neutral & turnout press; tall kneel shoulder ext, row, flexion  06/05/23 Pt seen for aquatic therapy today.  Treatment took place in water 3.5-4.75 ft in depth at the [facility] pool. Temp of water was 91.  Pt entered/exited the pool via stairs independently with bilat rail.  -Walking forward/ backward for 10 continuous minutes with reciprocal arm swing Side stepping then side skipping -side lunge  with arm addct/abdct with yellow hand floats x 6 - 3 way LE stretch RLE, (ITB, hamstring, adductor) x 10s each, foot on solid noodle - runners step ups with quick up/ slow coming down 2 x 8 (mindful of height of leg) R/L - SLS with opp arm moving noodle under water (circles/ swing front and back)->green hand bell (good challenge) - side plank on blue hand float x 30s each with hip abd 2 x 5 R/L (excellent execution) added top ue HB for improved control -plank with leg lifts 2 x 5. -STS from 3rd from bottom step with vc and demonstration for control.  2/19  Elliptical warm up 3 min lvl 1 (stopped due to fatigue) 6" step up and down 3x8  Shuttle SL leg press 75lbs 3x8 SLS on airex 30s 4x GTB paloff 3x10 ea Supine 90/90 3s 2x10 Figure 4 bridge 3x8 Kneeling plank 4x 10s     PATIENT EDUCATION:  Education details: Anatomy of condition, POC, HEP, exercise form/rationale Person educated: Patient Education method: Explanation, Demonstration, Tactile cues, Verbal cues, and Handouts Education comprehension: verbalized understanding, returned demonstration, verbal cues required, tactile cues required, and needs further education  HOME EXERCISE PROGRAM:  [email or web address]  Access Code: [email or web address]   Aquatic Access Code: [email or web address] URL: [email or web address] Date: 05/13/23 - updated 05/29/23 Prepared by: [facility] This aquatic home exercise program from [employer] utilizes pictures from land based exercises, but has been adapted prior to lamination and issuance.    ASSESSMENT:  CLINICAL IMPRESSION:  Pt gradually returning to level of energy and strength prior to flu.  She tolerated all exercises well, without increase in pain in hip.  Some palpable fascial tightness along ant quad; reviewed self massage techniques with her. Pt to work on HEP over next week and 1/2 and return next visit for check in with  PT.  Will discuss exercise  progressions and readiness to d/c.   Pt would benefit from continued skilled therapy in order to reach goals and maximize functional R LE strength and ROM for full return to PLOF.   REHAB POTENTIAL: Good  CLINICAL DECISION MAKING: Stable/uncomplicated  EVALUATION COMPLEXITY: Low   GOALS: Goals reviewed with patient? Yes  SHORT TERM GOALS: Target date: 4 weeks 04/28/23  Pain free flexion to 90 Baseline: Goal status: achieved   2.  Demo controlled quad set with hold Baseline:  Goal status: Achieved   3.  30s sit to stand without pain or compensation Baseline:  Goal status: achieved   4.  SLS 30s without compensation Baseline:  Goal status: achieved  LONG TERM GOALS:  Able to demo step up on 6" step with level pelvis and proper form Baseline:  Goal status: MET  06/03/23  2.  Will tolerate at least 3 min on elliptical, demonstrating good tolerance to repetitive weight bearing motion Baseline:  Goal status: MET  3.  Demonstrate proper form in at least 10 continuous lunges without increased pain Baseline:  Goal status: ongoing  Week 12 06/23/23  4.  Demonstrate gentle, double and single foot plyometric motions with good proximal form Baseline:  Goal status: ongoing Week 12 06/23/23      PLAN:  PT FREQUENCY: 1-2x/week  PT DURATION: 12 weeks  PLANNED INTERVENTIONS: 97164- PT Re-evaluation, 97110-Therapeutic exercises, 97530- Therapeutic activity, 97112- Neuromuscular re-education, 97535- Self Care, 66440- Manual therapy, [phone]- Gait training, [phone]- Aquatic Therapy, Patient/Family education, Balance training, Stair training, Taping, Dry Needling, Joint mobilization, Spinal mobilization, Scar mobilization, Cryotherapy, and Moist heat.  PLAN FOR NEXT SESSION: reformer exercises to cable/resistance band  Mayer Camel, PTA 07/02/23 6:27 PM [facility] [address] [place], [place], [address] Phone:  [phone]   Fax:  [phone]

## 2023-07-03 ENCOUNTER — Encounter (HOSPITAL_BASED_OUTPATIENT_CLINIC_OR_DEPARTMENT_OTHER): Payer: Self-pay | Admitting: Physical Therapy

## 2023-07-06 ENCOUNTER — Encounter (HOSPITAL_BASED_OUTPATIENT_CLINIC_OR_DEPARTMENT_OTHER): Payer: Commercial Managed Care - PPO | Admitting: Physical Therapy

## 2023-07-13 NOTE — Progress Notes (Unsigned)
 New Patient Note  RE: Valerie [name] MRN: [id number] DOB: [birth year] Date of Office Visit: 07/14/2023  Consult requested by: Imogene Burn, MD Primary care provider: [name], MD  Chief Complaint: No chief complaint on file.  History of Present Illness: I had the pleasure of seeing Valerie [name] for initial evaluation at the [facility] on 07/13/2023. She is a 53 y.o. female, who is referred here by [name], MD for the evaluation of allergies.  Discussed the use of AI scribe software for clinical note transcription with the patient, who gave verbal consent to proceed.  History of Present Illness         She reports symptoms of ***. Symptoms have been going on for *** years. The symptoms are present *** all year around with worsening in ***. Other triggers include exposure to ***. Anosmia: ***. Headache: ***. She has used *** with ***fair improvement in symptoms. Sinus infections: ***. Previous work up includes: ***. Previous ENT evaluation: ***. Previous sinus imaging: ***. History of nasal polyps: ***. Last eye exam: ***. History of reflux: ***.    06/29/2023 GI visit: "GERD Hiatal hernia History of esophageal stricture Throat clearing Mildly elevated LFTs Patient's GERD is best controlled on 40 mg every day. She was not able to tolerate decreasing her dosage to 20 mg every day. She still has issues with throat clearing, which are not benefiting from PPI therapy. I do wonder if her throat clearing may be related to post-nasal drip or seasonal allergies. Will refer her to allergy to see if they can perform some medical optimization and hopefully help with her throat clearing. Her recent labs did show mildly elevated LFTs, which may be related to some of her medications versus mild fatty liver.  Refill omeprazole 40 mg every day. Referral to allergist Next colonoscopy due in 2028 RTC in 1 year"  Assessment and Plan: Valerie [name] is a 53  y.o. female with: ***  Assessment and Plan               No follow-ups on file.  No orders of the defined types were placed in this encounter.  Lab Orders  No laboratory test(s) ordered today    Other allergy screening: Asthma: {Blank single:19197::"yes","no"} Rhino conjunctivitis: {Blank single:19197::"yes","no"} Food allergy: {Blank single:19197::"yes","no"} Medication allergy: {Blank single:19197::"yes","no"} Hymenoptera allergy: {Blank single:19197::"yes","no"} Urticaria: {Blank single:19197::"yes","no"} Eczema:{Blank single:19197::"yes","no"} History of recurrent infections suggestive of immunodeficency: {Blank single:19197::"yes","no"}  Diagnostics: Spirometry:  Tracings reviewed. Her effort: {Blank single:19197::"Good reproducible efforts.","It was hard to get consistent efforts and there is a question as to whether this reflects a maximal maneuver.","Poor effort, data can not be interpreted."} FVC: ***L FEV1: ***L, ***% predicted FEV1/FVC ratio: ***% Interpretation: {Blank single:19197::"Spirometry consistent with mild obstructive disease","Spirometry consistent with moderate obstructive disease","Spirometry consistent with severe obstructive disease","Spirometry consistent with possible restrictive disease","Spirometry consistent with mixed obstructive and restrictive disease","Spirometry uninterpretable due to technique","Spirometry consistent with normal pattern","No overt abnormalities noted given today's efforts"}.  Please see scanned spirometry results for details.  Skin Testing: {Blank single:19197::"Select foods","Environmental allergy panel","Environmental allergy panel and select foods","Food allergy panel","None","Deferred due to recent antihistamines use"}. *** Results discussed with patient/family.   Past Medical History: Patient Active Problem List   Diagnosis Date Noted  . Influenza A 06/25/2023  . High risk medication use 05/06/2023  . Genetic  testing 07/04/2022  . Family history of uterine cancer 06/24/2022  . Local edema 02/25/2022  . Hypothyroidism due to Hashimoto's thyroiditis 06/07/2021  .  Malignant neoplasm of lower-inner quadrant of left breast in female, estrogen receptor positive (HCC) 10/23/2020  . Optic neuritis, left 10/18/2020  . 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 Mitral regurgitation   . Neuromyelitis optica (devic) (HCC) 05/07/2020  . Hypothyroid 04/25/2020  . Optic neuritis, right 04/24/2020   Past Medical History:  Diagnosis Date  . Abnormal echocardiogram 11/28/2021   EF 60 -65%, abnormal global longitudinal strain, see cardiology OV in Epic dated 12/25/21  . Breast cancer (HCC)   . Cancer (HCC) 09/2020   left breast, ER+, s/p radiation therapy 12/25/20 - 01/18/21, Patient follows with Dr. Serena Croissant @ [facility].  Marland Kitchen COVID-19 11/2019   flu-like symptoms, cough, no hospitalizations  . Edema    lower extremity, follows w/ [facility], [place], [place]. See OV dated 03/24/22 in Epic.  Marland Kitchen GERD (gastroesophageal reflux disease)    Follows w/ Dr. Judeen Hammans, gastroenterologist and PCP, Dr. Maryruth [facility].  . Hashimoto's disease    Follows with Dr. Ernest Haber @ [facility].  Marland Kitchen History of hiatal hernia    2 cm hiatal hernia per 01/08/22 EGD  . History of radiation therapy 2022   completed 01-18-2021  . HOH (hard of hearing)    has gotten bilateral aides.  . Hypothyroidism    Follows w/ endocronologist, Dr. Ernest Haber @ [facility].  . Lymphedema    left arm, pt uses lymphedema pump at night  . Mitral regurgitation    mild by echo 06/2020  . 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 Despina Arias @ [facility].  Marland Kitchen Optic neuritis    Follows w/ opthamology, Dr. Mora Appl.03/2020 right eye (almost complete blindness), 04/2020 left eye  . Personal history of radiation  therapy   . PONV (postoperative nausea and vomiting)   . Status post dilation of esophageal narrowing 01/08/2022   EGD, esophagus dilated  . Wears glasses    Past Surgical History: Past Surgical History:  Procedure Laterality Date  . ABDOMINAL HYSTERECTOMY  04/23/22  . BIOPSY BREAST Left 10/17/2020  . BREAST LUMPECTOMY    . BREAST LUMPECTOMY WITH RADIOACTIVE SEED AND SENTINEL LYMPH NODE BIOPSY Left 11/15/2020   Procedure: LEFT BREAST LUMPECTOMY WITH RADIOACTIVE SEED AND LEFT SENTINEL LYMPH NODE BIOPSY;  Surgeon: Harriette Bouillon, MD;  Location: [facility];  Service: General;  Laterality: Left;  . BREAST SURGERY  August 2022   Cancer surgery  . COLONOSCOPY  09/06/2021   cecal polyp  . CT CORONARY CA SCORING  07/13/2020   Coronary Calcium = 0 (in Epic)  . CYSTOSCOPY N/A 04/23/2022   Procedure: CYSTOSCOPY;  Surgeon: Jerene Bears, MD;  Location: [facility];  Service: Gynecology;  Laterality: N/A;  . ESOPHAGOGASTRODUODENOSCOPY  01/08/2022   2cm hiatal hernia, esophagus dilated  . TOTAL LAPAROSCOPIC HYSTERECTOMY WITH SALPINGECTOMY Bilateral 04/23/2022   Procedure: TOTAL LAPAROSCOPIC HYSTERECTOMY BILATERAL SALPINGECTOMY, LEFT OOPHORECTOMY;  Surgeon: Jerene Bears, MD;  Location: [facility];  Service: Gynecology;  Laterality: Bilateral;  . TRANSTHORACIC ECHOCARDIOGRAM  11/28/2021   see results in Epic  . TRANSTHORACIC ECHOCARDIOGRAM  07/05/2020   in Epic  . TUBAL LIGATION  02/2012  . WISDOM TOOTH EXTRACTION     early 1990's   Medication List:  Current Outpatient Medications  Medication Sig Dispense Refill  . ACTEMRA 162 MG/0.9ML SOSY Inject 0.9 mLs (162 mg total) into the skin once a week. For  MOGAD Takes weekly on Saturday. 10.8 mL 1  . ascorbic acid (VITAMIN C) 1000 MG tablet Take 1 tablet by mouth daily.    . cholecalciferol (VITAMIN D3) 25 MCG (1000 UNIT) tablet Take 5,000 Units by mouth daily.    . furosemide (LASIX) 20 MG tablet  TAKE 1 TABLET BY MOUTH DAILY 90 tablet 3  . guaiFENesin-codeine 100-10 MG/5ML syrup Take 5 mLs by mouth at bedtime. 120 mL 0  . levothyroxine (SYNTHROID) 88 MCG tablet Take by mouth 88 mcg six days a week 75 tablet 3  . MAGNESIUM GLUCONATE PO Take 2 tablets by mouth daily at 12 noon.    Marland Kitchen omeprazole (PRILOSEC) 40 MG capsule Take 1 capsule (40 mg total) by mouth daily. 90 capsule 3  . Probiotic Product (CULTRELLE KIDS IMMUNE DEFENSE PO) Take 1 tablet by mouth daily.    Marland Kitchen UNABLE TO FIND Take 4 tablets by mouth daily. Med Name: Omega Pure 2 tablet in AM and 1 tablet in PM    . UNABLE TO FIND Take by mouth daily. Med Name: Ultra Inflamix 2 scoops daily    . UNABLE TO FIND Take 2 tablets by mouth daily. Med Name: R-Lipoic Acid     No current facility-administered medications for this visit.   Allergies: No Known Allergies Social History: Social History   Socioeconomic History  . Marital status: Married    Spouse name: Not on file  . Number of children: 2  . Years of education: BS  . Highest education level: Bachelor's degree (e.g., BA, AB, BS)  Occupational History  . Occupation: Psychologist, occupational  . Occupation: Teaching laboratory technician and receiving clerk  Tobacco Use  . Smoking status: Never  . Smokeless tobacco: Not on file  Vaping Use  . Vaping status: Never Used  Substance and Sexual Activity  . Alcohol use: Never  . Drug use: Never  . Sexual activity: Yes    Birth control/protection: Surgical, None    Comment: hysterectomy  Other Topics Concern  . Not on file  Social History Narrative   Right handed    Caffeine use: rare   Social Drivers of Health   Financial Resource Strain: Low Risk  (06/19/2023)   Overall Financial Resource Strain (CARDIA)   . Difficulty of Paying Living Expenses: Not hard at all  Food Insecurity: No Food Insecurity (06/19/2023)   Hunger Vital Sign   . Worried About Programme researcher, broadcasting/film/video in the Last Year: Never true   . Ran Out of Food in the Last Year: Never true   Transportation Needs: No Transportation Needs (06/19/2023)   PRAPARE - Transportation   . Lack of Transportation (Medical): No   . Lack of Transportation (Non-Medical): No  Physical Activity: Sufficiently Active (06/19/2023)   Exercise Vital Sign   . Days of Exercise per Week: 5 days   . Minutes of Exercise per Session: 50 min  Stress: No Stress Concern Present (06/19/2023)   [employer] of Occupational Health - Occupational Stress Questionnaire   . Feeling of Stress : Only a little  Social Connections: Socially Integrated (06/19/2023)   Social Connection and Isolation Panel [NHANES]   . Frequency of Communication with Friends and Family: More than three times a week   . Frequency of Social Gatherings with Friends and Family: Once a week   . Attends Religious Services: More than 4 times per year   . Active Member of Clubs or Organizations: Yes   . Attends Banker Meetings: More than 4  times per year   . Marital Status: Married   Lives in a ***. Smoking: *** Occupation: ***  Environmental HistorySurveyor, minerals in the house: Copywriter, advertising in the family room: {Blank single:19197::"yes","no"} Carpet in the bedroom: {Blank single:19197::"yes","no"} Heating: {Blank single:19197::"electric","gas","heat pump"} Cooling: {Blank single:19197::"central","window","heat pump"} Pet: {Blank single:19197::"yes ***","no"}  Family History: Family History  Problem Relation Age of Onset  . Hearing loss Mother   . Thyroid disease Mother   . Cancer Mother 67       endometrial cancer, lynch negative  . GER disease Mother   . Other Mother        uterine fibroids, optic disc abnormality   . Eczema Mother   . Colon polyps Mother   . Hypothyroidism Mother   . Osteoarthritis Mother   . Hypertension Mother   . Migraines Mother   . Mitral valve prolapse Father        regurgitation/prolase  . Colon polyps Father   . Hearing loss Father   . Other  Father        Colon adenoma  . Hyperlipidemia Father   . GER disease Father   . Heart murmur Father   . Amblyopia Father        Right eye  . Neuropathy Father   . Aortic aneurysm Father   . Heart murmur Brother   . Glaucoma Maternal Grandmother   . Macular degeneration Maternal Grandmother   . Arthritis Maternal Grandmother   . Arthritis Maternal Grandfather   . Multiple sclerosis Paternal Grandmother   . Heart murmur Paternal Grandfather   . Heart disease Paternal Grandfather   . Diabetes Maternal Aunt   . Heart murmur Paternal Aunt   . Breast cancer Neg Hx   . Colon cancer Neg Hx   . Esophageal cancer Neg Hx   . Rectal cancer Neg Hx   . Stomach cancer Neg Hx    Problem                               Relation Asthma                                   *** Eczema                                *** Food allergy                          *** Allergic rhino conjunctivitis     ***  Review of Systems  Constitutional:  Negative for appetite change, chills, fever and unexpected weight change.  HENT:  Negative for congestion and rhinorrhea.   Eyes:  Negative for itching.  Respiratory:  Negative for cough, chest tightness, shortness of breath and wheezing.   Cardiovascular:  Negative for chest pain.  Gastrointestinal:  Negative for abdominal pain.  Genitourinary:  Negative for difficulty urinating.  Skin:  Negative for rash.  Neurological:  Negative for headaches.   Objective: LMP 12/23/2021 (Exact Date)  There is no height or weight on file to calculate BMI. Physical Exam Vitals and nursing note reviewed.  Constitutional:      Appearance: Normal appearance. She is well-developed.  HENT:     Head: Normocephalic and atraumatic.     Right Ear:  Tympanic membrane and external ear normal.     Left Ear: Tympanic membrane and external ear normal.     Nose: Nose normal.     Mouth/Throat:     Mouth: Mucous membranes are moist.     Pharynx: Oropharynx is clear.  Eyes:      Conjunctiva/sclera: Conjunctivae normal.  Cardiovascular:     Rate and Rhythm: Normal rate and regular rhythm.     Heart sounds: Normal heart sounds. No murmur heard.    No friction rub. No gallop.  Pulmonary:     Effort: Pulmonary effort is normal.     Breath sounds: Normal breath sounds. No wheezing, rhonchi or rales.  Musculoskeletal:     Cervical back: Neck supple.  Skin:    General: Skin is warm.     Findings: No rash.  Neurological:     Mental Status: She is alert and oriented to person, place, and time.  Psychiatric:        Behavior: Behavior normal.  The plan was reviewed with the patient/family, and all questions/concerned were addressed.  It was my pleasure to see Valerie [name] today and participate in her care. Please feel free to contact me with any questions or concerns.  Sincerely,  Wyline Mood, DO Allergy & Immunology  [facility] office: [phone] [facility] office: [phone]

## 2023-07-14 ENCOUNTER — Ambulatory Visit: Admitting: Allergy

## 2023-07-14 ENCOUNTER — Other Ambulatory Visit: Payer: Self-pay

## 2023-07-14 ENCOUNTER — Encounter: Payer: Self-pay | Admitting: Allergy

## 2023-07-14 VITALS — BP 130/80 | HR 84 | Temp 98.3°F | Resp 18 | Ht 65.0 in | Wt 190.4 lb

## 2023-07-14 DIAGNOSIS — R0989 Other specified symptoms and signs involving the circulatory and respiratory systems: Secondary | ICD-10-CM

## 2023-07-14 DIAGNOSIS — K219 Gastro-esophageal reflux disease without esophagitis: Secondary | ICD-10-CM

## 2023-07-14 DIAGNOSIS — R052 Subacute cough: Secondary | ICD-10-CM | POA: Diagnosis not present

## 2023-07-14 NOTE — Patient Instructions (Addendum)
 Return for allergy skin testing. Will make additional recommendations based on results. If negative will refer to ENT next.  Make sure you don't take any antihistamines for 3 days before the skin testing appointment. Don't put any lotion on the back and arms on the day of testing.  Plan on being here for 30-60 minutes.   Reflux See handout for lifestyle and dietary modifications. Continue omeprazole 40mg  once day - nothing to eat or drink for 20-30 minutes afterwards.   Coughing Normal breathing test today. Monitor symptoms.   Follow up for skin testing next week.

## 2023-07-15 ENCOUNTER — Ambulatory Visit (HOSPITAL_BASED_OUTPATIENT_CLINIC_OR_DEPARTMENT_OTHER): Payer: Commercial Managed Care - PPO | Attending: Orthopaedic Surgery | Admitting: Physical Therapy

## 2023-07-15 ENCOUNTER — Encounter (HOSPITAL_BASED_OUTPATIENT_CLINIC_OR_DEPARTMENT_OTHER): Payer: Self-pay | Admitting: Physical Therapy

## 2023-07-15 DIAGNOSIS — R262 Difficulty in walking, not elsewhere classified: Secondary | ICD-10-CM | POA: Insufficient documentation

## 2023-07-15 DIAGNOSIS — M25551 Pain in right hip: Secondary | ICD-10-CM | POA: Diagnosis present

## 2023-07-15 DIAGNOSIS — M6281 Muscle weakness (generalized): Secondary | ICD-10-CM | POA: Insufficient documentation

## 2023-07-15 DIAGNOSIS — M25651 Stiffness of right hip, not elsewhere classified: Secondary | ICD-10-CM | POA: Insufficient documentation

## 2023-07-15 NOTE — Therapy (Signed)
 OUTPATIENT PHYSICAL THERAPY TREATMENT   Patient Name: Valerie [name] MRN: [id number] DOB:1970/11/10, 53 y.o., female Today's Date: 07/15/2023  END OF SESSION:  PT End of Session - 07/15/23 1204     Visit Number 23    Number of Visits 35    Date for PT Re-Evaluation 09/22/23    Authorization Type UHC  no VL    PT Start Time 1146    PT Stop Time 1226    PT Time Calculation (min) 40 min    Activity Tolerance Patient tolerated treatment well    Behavior During Therapy WFL for tasks assessed/performed                  Past Medical History:  Diagnosis Date   Abnormal echocardiogram 11/28/2021   EF 60 -65%, abnormal global longitudinal strain, see cardiology OV in Epic dated 12/25/21   Breast cancer (HCC)    Cancer (HCC) 09/2020   left breast, ER+, s/p radiation therapy 12/25/20 - 01/18/21, Patient follows with Dr. Serena Croissant @ [facility].   COVID-19 11/2019   flu-like symptoms, cough, no hospitalizations   Edema    lower extremity, follows w/ [facility], Sabino Dick, [place]. See OV dated 03/24/22 in Epic.   GERD (gastroesophageal reflux disease)    Follows w/ Dr. Judeen Hammans, gastroenterologist and PCP, Dr. Maryruth [facility] @ [facility].   Hashimoto's disease    Follows with Dr. Ernest Haber @ [place].   History of hiatal hernia    2 cm hiatal hernia per 01/08/22 EGD   History of radiation therapy 2022   completed 01-18-2021   [facility] (hard of hearing)    has gotten bilateral aides.   Hypothyroidism    Follows w/ endocronologist, Dr. Ernest Haber @ [place] [facility].   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 Despina Arias @ [facility].   Optic neuritis    Follows w/ opthamology, Dr. Mora Appl.03/2020 right eye (almost complete blindness), 04/2020 left eye   Personal history of  radiation therapy    PONV (postoperative nausea and vomiting)    Status post dilation of esophageal narrowing 01/08/2022   EGD, esophagus dilated   Wears glasses    Past Surgical History:  Procedure Laterality Date   ABDOMINAL HYSTERECTOMY  04/23/22   BIOPSY BREAST Left 10/17/2020   BREAST LUMPECTOMY     BREAST LUMPECTOMY WITH RADIOACTIVE SEED AND SENTINEL LYMPH NODE BIOPSY Left 11/15/2020   Procedure: LEFT BREAST LUMPECTOMY WITH RADIOACTIVE SEED AND LEFT SENTINEL LYMPH NODE BIOPSY;  Surgeon: Harriette Bouillon, MD;  Location: [facility];  Service: General;  Laterality: Left;   BREAST SURGERY  August 2022   Cancer surgery   COLONOSCOPY  09/06/2021   cecal polyp   CT CORONARY CA SCORING  07/13/2020   Coronary Calcium = 0 (in Epic)   CYSTOSCOPY N/A 04/23/2022   Procedure: CYSTOSCOPY;  Surgeon: Jerene Bears, MD;  Location: [facility];  Service: Gynecology;  Laterality: N/A;   ESOPHAGOGASTRODUODENOSCOPY  01/08/2022   2cm hiatal hernia, esophagus dilated   TOTAL LAPAROSCOPIC HYSTERECTOMY WITH SALPINGECTOMY Bilateral 04/23/2022   Procedure: TOTAL LAPAROSCOPIC HYSTERECTOMY BILATERAL SALPINGECTOMY, LEFT OOPHORECTOMY;  Surgeon: Jerene Bears, MD;  Location: [facility];  Service: Gynecology;  Laterality: Bilateral;   TRANSTHORACIC ECHOCARDIOGRAM  11/28/2021  see results in Epic   TRANSTHORACIC ECHOCARDIOGRAM  07/05/2020   in Epic   TUBAL LIGATION  02/2012   WISDOM TOOTH EXTRACTION     early 1990's   Patient Active Problem List   Diagnosis Date Noted   Influenza A 06/25/2023   High risk medication use 05/06/2023   Genetic testing 07/04/2022   Family history of uterine cancer 06/24/2022   Local edema 02/25/2022   Hypothyroidism due to Hashimoto's thyroiditis 06/07/2021   Malignant neoplasm of lower-inner quadrant of left breast in female, estrogen receptor positive (HCC) 10/23/2020   Optic neuritis, left 10/18/2020   Myelin oligodendrocyte  glycoprotein antibody disorder (MOGAD) (HCC) 07/10/2020   Mitral regurgitation    Neuromyelitis optica (devic) (HCC) 05/07/2020   Hypothyroid 04/25/2020   Optic neuritis, right 04/24/2020     REFERRING PROVIDER:  Huel Cote, MD    REFERRING DIAG:  [phone] (ICD-10-CM) - Tear of right acetabular labrum, initial encounter   s/p Rt acetabular labral repair  Rationale for Evaluation and Treatment: Rehabilitation  THERAPY DIAG:  Muscle weakness (generalized)  Pain in right hip  Difficulty in walking, not elsewhere classified  Stiffness of right hip, not elsewhere classified  ONSET DATE: DOS 03/31/23   SUBJECTIVE:                                                                                                                                                                                           SUBJECTIVE STATEMENT:  Pt reports she was able walk for 10x laps at the gym this morning. Pt is doing well and continuing to improve. Pt has not had pain. Has not started walking outdoors yet for exercise.   PERTINENT HISTORY:  N/a  PAIN:  Are you having pain? no  NPRS: 0/10   PRECAUTIONS:  None  RED FLAGS: None   WEIGHT BEARING RESTRICTIONS:  No  FALLS:  Has patient fallen in last 6 months? No  LIVING ENVIRONMENT: Lives with: lives with their spouse and 2 cats Lives in: House ranch with basement (washer/dryer & garage where she parks) Stairs: Yes: Internal: 12-14 steps; on left going up and External: 4 steps; on right going up Has following equipment at home: None  OCCUPATION:  works for [employer] including lifting up 25-30#, pushing & pulling   PLOF:  Independent  PATIENT GOALS:  Back to gym, improve flexibility   OBJECTIVE:  Note: Objective measures were completed at Evaluation unless otherwise noted.  PATIENT SURVEYS:  FOTO no longer available   Lower Extremity Functional Score: 55 / 80 = 68.8 %  AROM Right 3/12 Left 3/12  Hip  flexion 120 120  Hip extension 0 0  Hip abduction    Hip adduction    Hip internal rotation 32 30  Hip external rotation 30 45  Knee flexion    Knee extension    Ankle dorsiflexion    Ankle plantarflexion    Ankle inversion    Ankle eversion     (Blank rows = not tested)    MMT Right 3/12 Left 3/12   Hip flexion 44.0 52.8  Hip extension    Hip abduction 33.0 40.7  Hip adduction    Hip internal rotation 24.0 23.7  Hip external rotation 24.2 19.7  Knee flexion    Knee extension    Ankle dorsiflexion    Ankle plantarflexion    Ankle inversion    Ankle eversion     (Blank rows = not tested) l                                                                                                                          TREATMENT DATE:  4/2  5 min elliptical warm up  SL table squat 4x6  DL bridge off table 3x8 (deeper degree of hip hinge) Lunge hold at rail 5s 10x each way  Lateral lunge 2x10 each SLS on airex with UE reach 10x (hips still); 2x10 with head-hips movement during reach Double UE deep squat at rail 2x10 (focus on power on concentric)    [facility] Adult PT Treatment:                                                DATE:  3/20 (Pt warmed up on Elliptical 5 min prior to session)  Therapeutic Exercise: Standing quad stretch x 30s x 2 Long sitting SLR x 10 each LE -> SLR to/from hip abdct x 5 each LE Seated lumbar flexion stretch with UE on pball, then SB 15s 2x each side R hip flexor stretch x 15s x 2 (2nd rep with RUE overhead reach) Manual Therapy: STM to Rt quad, adductors to decrease fascial restrictions Neuromuscular re-ed: R forward/lateral step ups and 3 sec SLS on bosu x 10 Single leg forward UE reach to chair seat x 5 each LE Therapeutic Activity: Runners step ups x 10 each, intermittent UE to steady,  working on controlled descent when on RLE.  Self Care: Instructed on use of roller stick to Rt thigh; returned demo with cues    3/12  Mulligan  lateral mob, inf, grade IV  Supine SLR 3x10 Seated lumbar flexion stretch with SB 30s 2x each side SLS on airex with star tap 3x5  HEP review, return to exercise following illness   3/5  Nustep warm up 5 min STM R posterior hip and glute med Prone fig 4 PA grade III  Supine ITB stretch 30s 3x  Child's pose with SB 10x 3s each  way  Child's pose with SB 5x each way 3s holds Quadruped rock backs with IR bias 10x 2s holds DL Bosu squats 3x8 Standing hip flexor stretch with reach (foot in slight ER) 30s 3x   PATIENT EDUCATION:  Education details: Anatomy of condition, POC, HEP, exercise form/rationale Person educated: Patient Education method: Explanation, Demonstration, Tactile cues, Verbal cues, and Handouts Education comprehension: verbalized understanding, returned demonstration, verbal cues required, tactile cues required, and needs further education  HOME EXERCISE PROGRAM:  [email or web address]  Access Code: [email or web address]   Aquatic Access Code: [email or web address] URL: [email or web address] Date: 05/13/23 - updated 05/29/23 Prepared by: [facility] [address] This aquatic home exercise program from MedBridge utilizes pictures from land based exercises, but has been adapted prior to lamination and issuance.    ASSESSMENT:  CLINICAL IMPRESSION:  Pt able to progress exercise today with focus on SL stability and strength for her return to exercise/walking. Pt does fatigue quickly still into the R hip but is able to tolerate deeper degrees of motion during resistance exercise. Pt advised to continue with walking progression as well as gym based HEP. Plan update HEP next session with lunging motions. Potential for D/C in two sessions if pt continues to progress at current trajectory.  Pt would benefit from continued skilled therapy in order to reach goals and maximize functional R LE strength and ROM for full return to PLOF.   REHAB POTENTIAL:  Good  CLINICAL DECISION MAKING: Stable/uncomplicated  EVALUATION COMPLEXITY: Low   GOALS: Goals reviewed with patient? Yes  SHORT TERM GOALS: Target date: 4 weeks 04/28/23  Pain free flexion to 90 Baseline: Goal status: achieved   2.  Demo controlled quad set with hold Baseline:  Goal status: Achieved   3.  30s sit to stand without pain or compensation Baseline:  Goal status: achieved   4.  SLS 30s without compensation Baseline:  Goal status: achieved  LONG TERM GOALS:  Able to demo step up on 6" step with level pelvis and proper form Baseline:  Goal status: MET  06/03/23  2.  Will tolerate at least 3 min on elliptical, demonstrating good tolerance to repetitive weight bearing motion Baseline:  Goal status: MET  3.  Demonstrate proper form in at least 10 continuous lunges without increased pain Baseline:  Goal status: ongoing  Week 12 06/23/23  4.  Demonstrate gentle, double and single foot plyometric motions with good proximal form Baseline:  Goal status: ongoing Week 12 06/23/23      PLAN:  PT FREQUENCY: 1-2x/week  PT DURATION: 12 weeks  PLANNED INTERVENTIONS: 97164- PT Re-evaluation, 97110-Therapeutic exercises, 97530- Therapeutic activity, 97112- Neuromuscular re-education, 97535- Self Care, 02725- Manual therapy, [phone]- Gait training, [phone]- Aquatic Therapy, Patient/Family education, Balance training, Stair training, Taping, Dry Needling, Joint mobilization, Spinal mobilization, Scar mobilization, Cryotherapy, and Moist heat.  PLAN FOR NEXT SESSION: reformer exercises to cable/resistance band  Zebedee Iba PT, DPT 07/15/23 12:42 PM

## 2023-07-20 NOTE — Progress Notes (Unsigned)
 Skin testing note  RE: Valerie [name] MRN: [id number] DOB: 1970-05-18 Date of Office Visit: 07/21/2023  Referring provider: [name], MD Primary care provider: [name], MD  Chief Complaint: skin testing  History of Present Illness: I had the pleasure of seeing Valerie [name] for a skin testing visit at the [facility] on 07/21/2023. She is a 53 y.o. female, who is being followed for chronic throat clearing, cough, GERD. Her previous allergy office visit was on 07/14/2023 with Dr. Selena Batten. Today is a skin testing visit.   Discussed the use of AI scribe software for clinical note transcription with the patient, who gave verbal consent to proceed.    She has been experiencing persistent throat clearing for many years, with no variation in symptoms across different seasons, indicating a year-round issue. She believes her symptoms may be related to allergies.  Allergy testing today showed positive results for tree pollen, grass pollen, weed pollen, one mold, dust mites, cat, and horse allergens. She has a cat at home, which may contribute to her symptoms, especially since dust mites and cat dander are indoor allergens.  In her living environment, she sleeps on two pillows and uses a body pillow. She is considering using allergy covers for her pillows and mattress to manage her symptoms better.  She is currently taking medication for reflux, which is ongoing. Her cough has improved following a recent illness, which she believes exacerbated her symptoms.     Assessment and Plan: Valerie [name] is a 53 y.o. female with: Other allergic rhinitis Chronic throat clearing Past history - chronic throat clearing for approximately 15 years without a clear seasonal pattern. Possible contributing factors include postnasal drip and reflux. No significant nasal symptoms such as congestion, sneezing, or itchy eyes. Today's skin testing positive to grass, weed, trees, mold, dust  mites, cat, horse. Borderline to eggs - monitor symptoms after you eat eggs.  Start environmental control measures as below. Use over the counter antihistamines such as Zyrtec (cetirizine), Claritin (loratadine), Allegra (fexofenadine), or Xyzal (levocetirizine) daily as needed. May take twice a day during allergy flares. May switch antihistamines every few months. Start Ryaltris (olopatadine + mometasone nasal spray combination) 1-2 sprays per nostril twice a day. Sample given. This replaces your other nasal sprays. If this works well for you, then have pharmacy ship the medication to your home - prescription already sent in.  Nasal saline spray (i.e., Simply Saline) or nasal saline lavage (i.e., NeilMed) is recommended as needed and prior to medicated nasal sprays. Consider allergy injections for long term control if above medications do not help the symptoms - handout given.  2 injections.  If no improvement in the throat clearing with above regimen then will refer to ENT next - Dr. Rubye Oaks.    Gastroesophageal reflux disease, unspecified whether esophagitis present Past history - Prednisone use for MOGAD disease may have exacerbated reflux in the past. Follows with GI.  Continue lifestyle and dietary modifications. Continue omeprazole 40mg  once day - nothing to eat or drink for 20-30 minutes afterwards.    Subacute cough Past history - recent flu in early March with persistent cough that has mostly resolved. No recent sinus infections. Cough improved but still present at night, causing sleep disturbances. No prior history of asthma/inhaler use. 2025 spirometry was normal. Monitor symptoms.   Return in about 2 months (around 09/20/2023).  Meds ordered this encounter  Medications   Olopatadine-Mometasone (RYALTRIS) 665-25 MCG/ACT SUSP  Sig: Place 1-2 sprays into the nose in the morning and at bedtime.    Dispense:  29 g    Refill:  5   Olopatadine-Mometasone (RYALTRIS) 665-25 MCG/ACT  SUSP    Sig: 1-2 sprays per nostril twice a day    Dispense:  9 g    Refill:  0   Lab Orders  No laboratory test(s) ordered today    Diagnostics: Skin Testing: Environmental allergy panel and select foods. Today's skin testing positive to grass, weed, trees, mold, dust mites, cat, horse.  Borderline to eggs. Results discussed with patient/family.  Airborne Adult Perc - 07/21/23 0840     Time Antigen Placed 0840    Allergen Manufacturer Waynette Buttery    Location Back    Number of Test 55    1. Control-Buffer 50% Glycerol Negative    2. Control-Histamine 3+    3. Bahia Negative    4. French Southern Territories Negative    5. Johnson Negative    6. Kentucky Blue 2+    7. Meadow Fescue 2+    8. Perennial Rye 2+    9. Timothy Negative    10. Ragweed Mix Negative    11. Cocklebur Negative    12. Plantain,  English Negative    13. Baccharis Negative    14. Dog Fennel Negative    15. Russian Thistle Negative    16. Lamb's Quarters 2+    17. Sheep Sorrell 2+    18. Rough Pigweed 2+    19. Marsh Elder, Rough Negative    20. Mugwort, Common Negative    21. Box, Elder Negative    22. Cedar, red Negative    23. Sweet Gum Negative    24. Pecan Pollen Negative    25. Pine Mix Negative    26. Walnut, Black Pollen 2+    27. Red Mulberry 2+    28. Ash Mix 2+    29. Birch Mix Negative    30. Beech American Negative    31. Cottonwood, Guinea-Bissau Negative    32. Hickory, White 2+    33. Maple Mix Negative    34. Oak, Guinea-Bissau Mix Negative    35. Sycamore Eastern Negative    36. Alternaria Alternata Negative    37. Cladosporium Herbarum Negative    38. Aspergillus Mix Negative    39. Penicillium Mix 2+    40. Bipolaris Sorokiniana (Helminthosporium) Negative    41. Drechslera Spicifera (Curvularia) Negative    42. Mucor Plumbeus Negative    43. Fusarium Moniliforme Negative    44. Aureobasidium Pullulans (pullulara) Negative    45. Rhizopus Oryzae Negative    46. Botrytis Cinera Negative    47.  Epicoccum Nigrum Negative    48. Phoma Betae Negative    49. Dust Mite Mix 4+    50. Cat Hair 10,000 BAU/ml 2+    51.  Dog Epithelia Negative    52. Mixed Feathers Negative    53. Horse Epithelia 2+    54. Cockroach, German Negative    55. Tobacco Leaf Negative             Intradermal - 07/21/23 0911     Time Antigen Placed 0912    Allergen Manufacturer Waynette Buttery    Location Arm    Number of Test 10    Control Negative    Bahia Negative    French Southern Territories Negative    Johnson Negative    Ragweed Mix Negative    Mold 1 Negative  Mold 3 Negative    Mold 4 Negative    Dog Negative    Cockroach Negative             Food Adult Perc - 07/21/23 0800     Time Antigen Placed 0840    Allergen Manufacturer Waynette Buttery    Location Back    Number of allergen test 9    1. Peanut Negative    2. Soybean Negative    3. Wheat Negative    4. Sesame Negative    5. Milk, Cow Negative    6. Casein Negative    7. Egg White, Chicken --   +/-   8. Shellfish Mix Negative    9. Fish Mix Negative             Previous notes and tests were reviewed. The plan was reviewed with the patient/family, and all questions/concerned were addressed.  It was my pleasure to see Valerie [name] today and participate in her care. Please feel free to contact me with any questions or concerns.  Sincerely,  Wyline Mood, DO Allergy & Immunology  [facility] office: [phone] [facility] office: [phone]

## 2023-07-21 ENCOUNTER — Encounter: Payer: Self-pay | Admitting: Allergy

## 2023-07-21 ENCOUNTER — Ambulatory Visit: Admitting: Allergy

## 2023-07-21 ENCOUNTER — Other Ambulatory Visit (HOSPITAL_BASED_OUTPATIENT_CLINIC_OR_DEPARTMENT_OTHER): Payer: Self-pay

## 2023-07-21 DIAGNOSIS — J3089 Other allergic rhinitis: Secondary | ICD-10-CM

## 2023-07-21 DIAGNOSIS — K219 Gastro-esophageal reflux disease without esophagitis: Secondary | ICD-10-CM

## 2023-07-21 DIAGNOSIS — R052 Subacute cough: Secondary | ICD-10-CM

## 2023-07-21 DIAGNOSIS — R0989 Other specified symptoms and signs involving the circulatory and respiratory systems: Secondary | ICD-10-CM

## 2023-07-21 MED ORDER — LEVOCETIRIZINE DIHYDROCHLORIDE 5 MG PO TABS
5.0000 mg | ORAL_TABLET | Freq: Every evening | ORAL | 5 refills | Status: DC
  Filled 2023-07-21: qty 30, 30d supply, fill #0

## 2023-07-21 MED ORDER — RYALTRIS 665-25 MCG/ACT NA SUSP: 1.0000 | Freq: Two times a day (BID) | NASAL | 5 refills | Status: DC

## 2023-07-21 MED ORDER — RYALTRIS 665-25 MCG/ACT NA SUSP: NASAL | 0 refills | Status: DC

## 2023-07-21 NOTE — Progress Notes (Signed)
 Medication Samples have been provided to the patient.  Drug name: Ryaltris       Strength: 636mcg/25mcg per spray        Qty: 1  LOT: [id number]  Exp.Date: 07/12/2024  Dosing instructions: 1-2 sprays per nostril twice a day  The patient has been instructed regarding the correct time, dose, and frequency of taking this medication, including desired effects and most common side effects.   Valerie [name] 9:10 AM 07/21/2023

## 2023-07-21 NOTE — Patient Instructions (Addendum)
 Today's skin testing positive to grass, weed, trees, mold, dust mites, cat, horse.  Borderline to eggs - monitor symptoms after you eat eggs.   Results given.  Environmental allergies Start environmental control measures as below. Use over the counter antihistamines such as Zyrtec (cetirizine), Claritin (loratadine), Allegra (fexofenadine), or Xyzal (levocetirizine) daily as needed. May take twice a day during allergy flares. May switch antihistamines every few months. Start Ryaltris (olopatadine + mometasone nasal spray combination) 1-2 sprays per nostril twice a day. Sample given. This replaces your other nasal sprays. If this works well for you, then have pharmacy ship the medication to your home - prescription already sent in.  Nasal saline spray (i.e., Simply Saline) or nasal saline lavage (i.e., NeilMed) is recommended as needed and prior to medicated nasal sprays. Consider allergy injections for long term control if above medications do not help the symptoms - handout given.  2 injections.   Reflux Continue lifestyle and dietary modifications. Continue omeprazole 40mg  once day - nothing to eat or drink for 20-30 minutes afterwards.   Coughing Monitor symptoms.   Return in about 2 months (around 09/20/2023). Or sooner if needed.   Reducing Pollen Exposure Pollen seasons: trees (spring), grass (summer) and ragweed/weeds (fall). Keep windows closed in your home and car to lower pollen exposure.  Install air conditioning in the bedroom and throughout the house if possible.  Avoid going out in dry windy days - especially early morning. Pollen counts are highest between 5 - 10 AM and on dry, hot and windy days.  Save outside activities for late afternoon or after a heavy rain, when pollen levels are lower.  Avoid mowing of grass if you have grass pollen allergy. Be aware that pollen can also be transported indoors on people and pets.  Dry your clothes in an automatic dryer rather than  hanging them outside where they might collect pollen.  Rinse hair and eyes before bedtime.  Mold Control Mold and fungi can grow on a variety of surfaces provided certain temperature and moisture conditions exist.  Outdoor molds grow on plants, decaying vegetation and soil. The major outdoor mold, Alternaria and Cladosporium, are found in very high numbers during hot and dry conditions. Generally, a late summer - fall peak is seen for common outdoor fungal spores. Rain will temporarily lower outdoor mold spore count, but counts rise rapidly when the rainy period ends. The most important indoor molds are Aspergillus and Penicillium. Dark, humid and poorly ventilated basements are ideal sites for mold growth. The next most common sites of mold growth are the bathroom and the kitchen. Outdoor (Seasonal) Mold Control Use air conditioning and keep windows closed. Avoid exposure to decaying vegetation. Avoid leaf raking. Avoid grain handling. Consider wearing a face mask if working in moldy areas.  Indoor (Perennial) Mold Control  Maintain humidity below 50%. Get rid of mold growth on hard surfaces with water, detergent and, if necessary, 5% bleach (do not mix with other cleaners). Then dry the area completely. If mold covers an area more than 10 square feet, consider hiring an indoor environmental professional. For clothing, washing with soap and water is best. If moldy items cannot be cleaned and dried, throw them away. Remove sources e.g. contaminated carpets. Repair and seal leaking roofs or pipes. Using dehumidifiers in damp basements may be helpful, but empty the water and clean units regularly to prevent mildew from forming. All rooms, especially basements, bathrooms and kitchens, require ventilation and cleaning to deter mold and mildew  growth. Avoid carpeting on concrete or damp floors, and storing items in damp areas. Control of House Dust Mite Allergen Dust mite allergens are a common  trigger of allergy and asthma symptoms. While they can be found throughout the house, these microscopic creatures thrive in warm, humid environments such as bedding, upholstered furniture and carpeting. Because so much time is spent in the bedroom, it is essential to reduce mite levels there.  Encase pillows, mattresses, and box springs in special allergen-proof fabric covers or airtight, zippered plastic covers.  Bedding should be washed weekly in hot water (130 F) and dried in a hot dryer. Allergen-proof covers are available for comforters and pillows that can't be regularly washed.  Wash the allergy-proof covers every few months. Minimize clutter in the bedroom. Keep pets out of the bedroom.  Keep humidity less than 50% by using a dehumidifier or air conditioning. You can buy a humidity measuring device called a hygrometer to monitor this.  If possible, replace carpets with hardwood, linoleum, or washable area rugs. If that's not possible, vacuum frequently with a vacuum that has a HEPA filter. Remove all upholstered furniture and non-washable window drapes from the bedroom. Remove all non-washable stuffed toys from the bedroom.  Wash stuffed toys weekly. Pet Allergen Avoidance: Contrary to popular opinion, there are no "hypoallergenic" breeds of dogs or cats. That is because people are not allergic to an animal's hair, but to an allergen found in the animal's saliva, dander (dead skin flakes) or urine. Pet allergy symptoms typically occur within minutes. For some people, symptoms can build up and become most severe 8 to 12 hours after contact with the animal. People with severe allergies can experience reactions in public places if dander has been transported on the pet owners' clothing. Keeping an animal outdoors is only a partial solution, since homes with pets in the yard still have higher concentrations of animal allergens. Before getting a pet, ask your allergist to determine if you are  allergic to animals. If your pet is already considered part of your family, try to minimize contact and keep the pet out of the bedroom and other rooms where you spend a great deal of time. As with dust mites, vacuum carpets often or replace carpet with a hardwood floor, tile or linoleum. High-efficiency particulate air (HEPA) cleaners can reduce allergen levels over time. While dander and saliva are the source of cat and dog allergens, urine is the source of allergens from rabbits, hamsters, mice and [place] pigs; so ask a non-allergic family member to clean the animal's cage. If you have a pet allergy, talk to your allergist about the potential for allergy immunotherapy (allergy shots). This strategy can often provide long-term relief.

## 2023-07-22 ENCOUNTER — Ambulatory Visit (HOSPITAL_BASED_OUTPATIENT_CLINIC_OR_DEPARTMENT_OTHER): Payer: Commercial Managed Care - PPO | Admitting: Physical Therapy

## 2023-07-22 ENCOUNTER — Encounter (HOSPITAL_BASED_OUTPATIENT_CLINIC_OR_DEPARTMENT_OTHER): Payer: Self-pay | Admitting: Physical Therapy

## 2023-07-22 DIAGNOSIS — M25551 Pain in right hip: Secondary | ICD-10-CM

## 2023-07-22 DIAGNOSIS — M25651 Stiffness of right hip, not elsewhere classified: Secondary | ICD-10-CM

## 2023-07-22 DIAGNOSIS — M6281 Muscle weakness (generalized): Secondary | ICD-10-CM | POA: Diagnosis not present

## 2023-07-22 DIAGNOSIS — R262 Difficulty in walking, not elsewhere classified: Secondary | ICD-10-CM

## 2023-07-22 NOTE — Therapy (Signed)
 OUTPATIENT PHYSICAL THERAPY TREATMENT   Patient Name: Valerie [name] MRN: [id number] DOB:01-19-1971, 53 y.o., female Today's Date: 07/22/2023  END OF SESSION:  PT End of Session - 07/22/23 0807     Visit Number 24    Number of Visits 35    Date for PT Re-Evaluation 09/22/23    Authorization Type UHC  no VL    PT Start Time 0800                  Past Medical History:  Diagnosis Date   Abnormal echocardiogram 11/28/2021   EF 60 -65%, abnormal global longitudinal strain, see cardiology OV in Epic dated 12/25/21   Breast cancer (HCC)    Cancer (HCC) 09/2020   left breast, ER+, s/p radiation therapy 12/25/20 - 01/18/21, Patient follows with Dr. Serena Croissant @ [facility].   COVID-19 11/2019   flu-like symptoms, cough, no hospitalizations   Edema    lower extremity, follows w/ [facility], Sabino Dick, [place]. See OV dated 03/24/22 in Epic.   GERD (gastroesophageal reflux disease)    Follows w/ Dr. Judeen Hammans, gastroenterologist and PCP, Dr. Maryruth Hancock [facility].   Hashimoto's disease    Follows with Dr. Ernest Haber @ [place].   History of hiatal hernia    2 cm hiatal hernia per 01/08/22 EGD   History of radiation therapy 2022   completed 01-18-2021   [facility] (hard of hearing)    has gotten bilateral aides.   Hypothyroidism    Follows w/ endocronologist, Dr. Ernest Haber @ [place].   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 Despina Arias @ [facility].   Optic neuritis    Follows w/ opthamology, Dr. Mora Appl.03/2020 right eye (almost complete blindness), 04/2020 left eye   Personal history of radiation therapy    PONV (postoperative nausea and vomiting)    Status post dilation of esophageal narrowing 01/08/2022   EGD, esophagus dilated   Wears glasses    Past  Surgical History:  Procedure Laterality Date   ABDOMINAL HYSTERECTOMY  04/23/22   BIOPSY BREAST Left 10/17/2020   BREAST LUMPECTOMY     BREAST LUMPECTOMY WITH RADIOACTIVE SEED AND SENTINEL LYMPH NODE BIOPSY Left 11/15/2020   Procedure: LEFT BREAST LUMPECTOMY WITH RADIOACTIVE SEED AND LEFT SENTINEL LYMPH NODE BIOPSY;  Surgeon: Harriette Bouillon, MD;  Location: [facility];  Service: General;  Laterality: Left;   BREAST SURGERY  August 2022   Cancer surgery   COLONOSCOPY  09/06/2021   cecal polyp   CT CORONARY CA SCORING  07/13/2020   Coronary Calcium = 0 (in Epic)   CYSTOSCOPY N/A 04/23/2022   Procedure: CYSTOSCOPY;  Surgeon: Jerene Bears, MD;  Location: [facility];  Service: Gynecology;  Laterality: N/A;   ESOPHAGOGASTRODUODENOSCOPY  01/08/2022   2cm hiatal hernia, esophagus dilated   TOTAL LAPAROSCOPIC HYSTERECTOMY WITH SALPINGECTOMY Bilateral 04/23/2022   Procedure: TOTAL LAPAROSCOPIC HYSTERECTOMY BILATERAL SALPINGECTOMY, LEFT OOPHORECTOMY;  Surgeon: Jerene Bears, MD;  Location: [facility];  Service: Gynecology;  Laterality: Bilateral;   TRANSTHORACIC ECHOCARDIOGRAM  11/28/2021   see results in Epic   TRANSTHORACIC ECHOCARDIOGRAM  07/05/2020   in Epic   TUBAL LIGATION  02/2012   WISDOM TOOTH EXTRACTION     early 1990's   Patient  Active Problem List   Diagnosis Date Noted   Influenza A 06/25/2023   High risk medication use 05/06/2023   Genetic testing 07/04/2022   Family history of uterine cancer 06/24/2022   Local edema 02/25/2022   Hypothyroidism due to Hashimoto's thyroiditis 06/07/2021   Malignant neoplasm of lower-inner quadrant of left breast in female, estrogen receptor positive (HCC) 10/23/2020   Optic neuritis, left 10/18/2020   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) 07/10/2020   Mitral regurgitation    Neuromyelitis optica (devic) (HCC) 05/07/2020   Hypothyroid 04/25/2020   Optic neuritis,  right 04/24/2020     REFERRING PROVIDER:  Huel Cote, MD    REFERRING DIAG:  [id number] (ICD-10-CM) - Tear of right acetabular labrum, initial encounter   s/p Rt acetabular labral repair  Rationale for Evaluation and Treatment: Rehabilitation  THERAPY DIAG:  Muscle weakness (generalized)  Pain in right hip  Difficulty in walking, not elsewhere classified  Stiffness of right hip, not elsewhere classified  ONSET DATE: DOS 03/31/23   SUBJECTIVE:                                                                                                                                                                                           SUBJECTIVE STATEMENT:  Pt reports she walked 1 mile on track and completed full work out (minus squats) this morning prior to appt.  No pain in hip.  Pt requests to disch  PERTINENT HISTORY:  N/a  PAIN:  Are you having pain? no  NPRS: 0/10   PRECAUTIONS:  None  RED FLAGS: None   WEIGHT BEARING RESTRICTIONS:  No  FALLS:  Has patient fallen in last 6 months? No  LIVING ENVIRONMENT: Lives with: lives with their spouse and 2 cats Lives in: House ranch with basement (washer/dryer & garage where she parks) Stairs: Yes: Internal: 12-14 steps; on left going up and External: 4 steps; on right going up Has following equipment at home: None  OCCUPATION:  works for [employer] including lifting up 25-30#, pushing & pulling   PLOF:  Independent  PATIENT GOALS:  Back to gym, improve flexibility   OBJECTIVE:  Note: Objective measures were completed at Evaluation unless otherwise noted.  PATIENT SURVEYS:  FOTO no longer available   Lower Extremity Functional Score: 55 / 80 = 68.8 %  AROM Right 3/12 Left 3/12  Hip flexion 120 120  Hip extension 0 0  Hip abduction    Hip adduction    Hip internal rotation 32 30  Hip external rotation 30 45  Knee flexion    Knee extension    Ankle dorsiflexion  Ankle plantarflexion     Ankle inversion    Ankle eversion     (Blank rows = not tested)    MMT Right 3/12 Left 3/12   Hip flexion 44.0 52.8  Hip extension    Hip abduction 33.0 40.7  Hip adduction    Hip internal rotation 24.0 23.7  Hip external rotation 24.2 19.7  Knee flexion    Knee extension    Ankle dorsiflexion    Ankle plantarflexion    Ankle inversion    Ankle eversion     (Blank rows = not tested) l                                                                                                                          TREATMENT DATE:  4/9 Therapeutic Exercise: Warm up with 1 lap around track Lateral lunge x 10 each LE Forward lunge x 5 each; backward lunge x 5 each  Lap around track for rest and recovery Plyo jumps forward up/ forward down 6" plyostack; single hop forward 6"; forward/ backward jump on 6"  Lap around track for rest and recovery 12" step ups on plyostack x 8 Neuromuscular re-ed: Airex balance beam: tandem stance with horiz head scanning of room; slow side high knee march; single leg stance; tandem stance with eyes closed; toe walking (side stepping R/L)  4/2  5 min elliptical warm up  SL table squat 4x6  DL bridge off table 3x8 (deeper degree of hip hinge) Lunge hold at rail 5s 10x each way  Lateral lunge 2x10 each SLS on airex with UE reach 10x (hips still); 2x10 with head-hips movement during reach Double UE deep squat at rail 2x10 (focus on power on concentric)    [facility] Adult PT Treatment:                                                DATE:  3/20 (Pt warmed up on Elliptical 5 min prior to session)  Therapeutic Exercise: Standing quad stretch x 30s x 2 Long sitting SLR x 10 each LE -> SLR to/from hip abdct x 5 each LE Seated lumbar flexion stretch with UE on pball, then SB 15s 2x each side R hip flexor stretch x 15s x 2 (2nd rep with RUE overhead reach) Manual Therapy: STM to Rt quad, adductors to decrease fascial restrictions Neuromuscular re-ed: R  forward/lateral step ups and 3 sec SLS on bosu x 10 Single leg forward UE reach to chair seat x 5 each LE Therapeutic Activity: Runners step ups x 10 each, intermittent UE to steady,  working on controlled descent when on RLE.  Self Care: Instructed on use of roller stick to Rt thigh; returned demo with cues    3/12  Mulligan lateral mob, inf, grade IV  Supine SLR 3x10 Seated lumbar flexion stretch  with SB 30s 2x each side SLS on airex with star tap 3x5  HEP review, return to exercise following illness   3/5  Nustep warm up 5 min STM R posterior hip and glute med Prone fig 4 PA grade III  Supine ITB stretch 30s 3x  Child's pose with SB 10x 3s each way  Child's pose with SB 5x each way 3s holds Quadruped rock backs with IR bias 10x 2s holds DL Bosu squats 3x8 Standing hip flexor stretch with reach (foot in slight ER) 30s 3x   PATIENT EDUCATION:  Education details: Anatomy of condition, POC, HEP, exercise form/rationale Person educated: Patient Education method: Explanation, Demonstration, Tactile cues, Verbal cues, and Handouts Education comprehension: verbalized understanding, returned demonstration, verbal cues required, tactile cues required, and needs further education  HOME EXERCISE PROGRAM:  [email or web address]  Access Code: [email or web address]   Aquatic Access Code: [email or web address] URL: [email or web address] Date: 05/13/23 - updated 05/29/23 Prepared by: [facility] [address] This aquatic home exercise program from MedBridge utilizes pictures from land based exercises, but has been adapted prior to lamination and issuance.    ASSESSMENT:  CLINICAL IMPRESSION: Pt tolerated session well without any production of symptoms.  Pt has met remaining goals and requests to d/c to HEP.    REHAB POTENTIAL: Good  CLINICAL DECISION MAKING: Stable/uncomplicated  EVALUATION COMPLEXITY: Low   GOALS: Goals reviewed with patient? Yes  SHORT  TERM GOALS: Target date: 4 weeks 04/28/23  Pain free flexion to 90 Baseline: Goal status: achieved   2.  Demo controlled quad set with hold Baseline:  Goal status: Achieved   3.  30s sit to stand without pain or compensation Baseline:  Goal status: achieved   4.  SLS 30s without compensation Baseline:  Goal status: achieved  LONG TERM GOALS:  Able to demo step up on 6" step with level pelvis and proper form Baseline:  Goal status: MET  06/03/23  2.  Will tolerate at least 3 min on elliptical, demonstrating good tolerance to repetitive weight bearing motion Baseline:  Goal status: MET  3.  Demonstrate proper form in at least 10 continuous lunges without increased pain Baseline:  Goal status: met 4/9/5  4.  Demonstrate gentle, double and single foot plyometric motions with good proximal form Baseline:  Goal status: met 07/22/23      PLAN:  PT FREQUENCY: 1-2x/week  PT DURATION: 12 weeks  PLANNED INTERVENTIONS: 97164- PT Re-evaluation, 97110-Therapeutic exercises, 97530- Therapeutic activity, 97112- Neuromuscular re-education, 97535- Self Care, 82956- Manual therapy, [phone]- Gait training, [phone]- Aquatic Therapy, Patient/Family education, Balance training, Stair training, Taping, Dry Needling, Joint mobilization, Spinal mobilization, Scar mobilization, Cryotherapy, and Moist heat.  Mayer Camel, PTA 07/22/23 8:45 AM [facility] [address] [place], [place], [address] Phone: [phone]   Fax:  [phone]

## 2023-07-27 ENCOUNTER — Ambulatory Visit: Payer: Commercial Managed Care - PPO | Attending: Surgery

## 2023-07-27 VITALS — Wt 190.4 lb

## 2023-07-27 DIAGNOSIS — Z483 Aftercare following surgery for neoplasm: Secondary | ICD-10-CM | POA: Insufficient documentation

## 2023-07-27 NOTE — Therapy (Signed)
 OUTPATIENT PHYSICAL THERAPY SOZO SCREENING NOTE   Patient Name: Valerie [name] MRN: [id number] DOB:12-22-1970, 53 y.o., female Today's Date: 07/27/2023  PCP: [name], MD REFERRING PROVIDER: Harriette Bouillon, MD   PT End of Session - 07/27/23 1658     Visit Number 24   # unchanged due to screen only   PT Start Time 1656    PT Stop Time 1700    PT Time Calculation (min) 4 min    Activity Tolerance Patient tolerated treatment well    Behavior During Therapy [facility] for tasks assessed/performed             Past Medical History:  Diagnosis Date   Abnormal echocardiogram 11/28/2021   EF 60 -65%, abnormal global longitudinal strain, see cardiology OV in Epic dated 12/25/21   Breast cancer (HCC)    Cancer (HCC) 09/2020   left breast, ER+, s/p radiation therapy 12/25/20 - 01/18/21, Patient follows with Dr. Serena Croissant @ [facility].   COVID-19 11/2019   flu-like symptoms, cough, no hospitalizations   Edema    lower extremity, follows w/ [facility], Sabino Dick, [place]. See OV dated 03/24/22 in Epic.   GERD (gastroesophageal reflux disease)    Follows w/ Dr. Judeen Hammans, gastroenterologist and PCP, Dr. Maryruth Hancock [facility].   Hashimoto's disease    Follows with Dr. Ernest Haber @ [place].   History of hiatal hernia    2 cm hiatal hernia per 01/08/22 EGD   History of radiation therapy 2022   completed 01-18-2021   [facility] (hard of hearing)    has gotten bilateral aides.   Hypothyroidism    Follows w/ endocronologist, Dr. Ernest Haber @ .   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 Despina Arias @ [facility].   Optic neuritis    Follows w/ opthamology, Dr. Mora Appl.03/2020 right eye (almost complete blindness), 04/2020 left eye   Personal history of radiation  therapy    PONV (postoperative nausea and vomiting)    Status post dilation of esophageal narrowing 01/08/2022   EGD, esophagus dilated   Wears glasses    Past Surgical History:  Procedure Laterality Date   ABDOMINAL HYSTERECTOMY  04/23/22   BIOPSY BREAST Left 10/17/2020   BREAST LUMPECTOMY     BREAST LUMPECTOMY WITH RADIOACTIVE SEED AND SENTINEL LYMPH NODE BIOPSY Left 11/15/2020   Procedure: LEFT BREAST LUMPECTOMY WITH RADIOACTIVE SEED AND LEFT SENTINEL LYMPH NODE BIOPSY;  Surgeon: Harriette Bouillon, MD;  Location: [facility];  Service: General;  Laterality: Left;   BREAST SURGERY  August 2022   Cancer surgery   COLONOSCOPY  09/06/2021   cecal polyp   CT CORONARY CA SCORING  07/13/2020   Coronary Calcium = 0 (in Epic)   CYSTOSCOPY N/A 04/23/2022   Procedure: CYSTOSCOPY;  Surgeon: Jerene Bears, MD;  Location: [facility];  Service: Gynecology;  Laterality: N/A;   ESOPHAGOGASTRODUODENOSCOPY  01/08/2022   2cm hiatal hernia, esophagus dilated   TOTAL LAPAROSCOPIC HYSTERECTOMY WITH SALPINGECTOMY Bilateral 04/23/2022   Procedure: TOTAL LAPAROSCOPIC HYSTERECTOMY BILATERAL SALPINGECTOMY, LEFT OOPHORECTOMY;  Surgeon: Jerene Bears, MD;  Location: [facility];  Service: Gynecology;  Laterality: Bilateral;   TRANSTHORACIC ECHOCARDIOGRAM  11/28/2021   see results in Epic   TRANSTHORACIC ECHOCARDIOGRAM  07/05/2020  in Epic   TUBAL LIGATION  02/2012   WISDOM TOOTH EXTRACTION     early 1990's   Patient Active Problem List   Diagnosis Date Noted   Influenza A 06/25/2023   High risk medication use 05/06/2023   Genetic testing 07/04/2022   Family history of uterine cancer 06/24/2022   Local edema 02/25/2022   Hypothyroidism due to Hashimoto's thyroiditis 06/07/2021   Malignant neoplasm of lower-inner quadrant of left breast in female, estrogen receptor positive (HCC) 10/23/2020   Optic neuritis, left 10/18/2020   Myelin oligodendrocyte  glycoprotein antibody disorder (MOGAD) (HCC) 07/10/2020   Mitral regurgitation    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 She says that she has a problem with passing out with medical things but can let us  know if she feels this might happen.   PRECAUTIONS: left UE Lymphedema risk, None  SUBJECTIVE: Pt returns for 6 month L-Dex screen. "I think I'll just call you guys for screens now prn since I can monitor my lymphedema and know to wear compression."  PAIN:  Are you having pain? No  SOZO SCREENING: Patient was assessed today using the SOZO machine to determine the lymphedema index score. This was compared to her baseline score. It was determined that she is within the recommended range when compared to her baseline and no further action is needed at this time. She will continue SOZO screenings. These are done every 3 months for 2 years post operatively followed by every 6 months for 2 years, and then annually.   L-DEX FLOWSHEETS - 07/27/23 1600       L-DEX LYMPHEDEMA SCREENING   Measurement Type Unilateral    L-DEX MEASUREMENT EXTREMITY Upper Extremity    POSITION  Standing    DOMINANT SIDE Right    At Risk Side Left    BASELINE SCORE (UNILATERAL) 2.5    L-DEX SCORE (UNILATERAL) 4.9    VALUE CHANGE (UNILAT) 2.4             P: Pt is going to stop SOZO's at this time as she has learned to treat her lymphedema symptoms prn and will call us  prn.  Denyce Flank, PTA 07/27/2023, 4:59 PM

## 2023-07-29 ENCOUNTER — Encounter (HOSPITAL_BASED_OUTPATIENT_CLINIC_OR_DEPARTMENT_OTHER): Payer: Commercial Managed Care - PPO | Admitting: Physical Therapy

## 2023-08-03 ENCOUNTER — Other Ambulatory Visit: Payer: Self-pay | Admitting: Hematology and Oncology

## 2023-08-03 DIAGNOSIS — Z9889 Other specified postprocedural states: Secondary | ICD-10-CM

## 2023-08-11 ENCOUNTER — Encounter: Payer: Self-pay | Admitting: Neurology

## 2023-08-11 NOTE — Telephone Encounter (Signed)
 I spoke to her about her symptoms.  Unclear what the etiology is.  She feels completely back to baseline.  I think a demyelinating plaque is not likely due to her being completely baseline at this time.  Therefore, we will hold off on checking MRI.  However, if more spells occur we would likely want to check a spine MRI due to her primary diagnosis of MOGAD  I do not have an explanation for the smokes about a year later.  However, if she continues to have the spells I will want to check an EEG to make sure that there is not evidence of partial seizures

## 2023-08-12 ENCOUNTER — Encounter: Payer: Self-pay | Admitting: Skilled Nursing Facility1

## 2023-08-12 ENCOUNTER — Encounter: Attending: Family Medicine | Admitting: Skilled Nursing Facility1

## 2023-08-12 DIAGNOSIS — E669 Obesity, unspecified: Secondary | ICD-10-CM | POA: Diagnosis present

## 2023-08-12 NOTE — Progress Notes (Signed)
 Medical Nutrition Therapy  Appointment Start time:  4:45  Appointment End time:  5:54  Primary concerns today: weight loss  Referral diagnosis: GERD   NUTRITION ASSESSMENT    Clinical Medical Hx: GERD, Cancer, Hashimotos Medications:  Labs:  Notable Signs/Symptoms:   Lifestyle & Dietary Hx  Pt states she dd a parasite cleanse.   Estimated daily fluid intake:  oz Supplements:  Sleep:  Stress / self-care:  Current average weekly physical activity: 5 days a week: walking and upper body, 2 days a week laps in the pool  24-Hr Dietary Recall First Meal: protein shake (protein powder with water)  Snack: ultra infalmex shake with a bunch of supplements  Second Meal 11:30: leftovers + veggies + pasta Snack:  Third Meal: eaten out Snack:  Beverages: water    NUTRITION INTERVENTION  Nutrition education (E-1) on the following topics:  Creation of balanced and diverse meals to increase the intake of nutrient-rich foods that provide essential vitamins, minerals, fiber, and phytonutrients Variety of Fruits and Vegetables: Aim for a colorful array of fruits and vegetables to ensure a wide range of nutrients. Include a mix of leafy greens, berries, citrus fruits, cruciferous vegetables, and more. Whole Grains: Choose whole grains over refined grains. Examples include brown rice, quinoa, oats, whole wheat, and barley. Lean Proteins: Include lean sources of protein, such as poultry, fish, tofu, legumes, beans, lentils, and low-fat dairy products. Limit red and processed meats. Healthy Fats: Incorporate sources of healthy fats, including avocados, nuts, seeds, and olive oil. Limit saturated and trans fats found in fried and processed foods. Dairy or Dairy Alternatives: Choose low-fat or fat-free dairy products, or plant-based alternatives like almond or soy milk. Portion Control: Be mindful of portion sizes to avoid overeating. Pay attention to hunger and satisfaction cues. Limit  Added Sugars: Minimize the consumption of sugary beverages, snacks, and desserts. Check food labels for added sugars and opt for natural sources of sweetness such as whole fruits. Hydration: Drink plenty of water throughout the day. Limit sugary drinks and excessive caffeine intake. Moderate Sodium Intake: Reduce the consumption of high-sodium foods. Use herbs and spices for flavor instead of excessive salt. Meal Planning and Preparation: Plan and prepare meals ahead of time to make healthier choices more convenient. Include a mix of food groups in each meal. Limit Processed Foods: Minimize the intake of highly processed and packaged foods that are often high in added sugars, salt, and unhealthy fats. Regular Physical Activity: Combine a healthy diet with regular physical activity for overall well-being. Aim for at least 150 minutes of moderate-intensity aerobic exercise per week, along with strength training. Moderation and Balance: Enjoy treats and indulgent foods in moderation, emphasizing balance rather than strict restriction.  Handouts Provided Include  Detailed MyPlate Micro/Macro  Learning Style & Readiness for Change Teaching method utilized: Visual & Auditory  Demonstrated degree of understanding via: Teach Back  Barriers to learning/adherence to lifestyle change: unidentified  Goals Established by Pt Increase intensity of walks  Create balanced meals   MONITORING & EVALUATION Dietary intake, weekly physical activity  Next Steps  Patient is to call our office tomorrow to make a follow up appt for 4 weeks from now.

## 2023-08-17 ENCOUNTER — Other Ambulatory Visit: Payer: Self-pay

## 2023-08-17 MED ORDER — LEVOCETIRIZINE DIHYDROCHLORIDE 5 MG PO TABS: 5.0000 mg | ORAL_TABLET | Freq: Every evening | ORAL | 0 refills | Status: AC

## 2023-08-21 ENCOUNTER — Encounter: Payer: Self-pay | Admitting: Family Medicine

## 2023-08-21 ENCOUNTER — Ambulatory Visit (INDEPENDENT_AMBULATORY_CARE_PROVIDER_SITE_OTHER): Payer: Commercial Managed Care - PPO | Admitting: Family Medicine

## 2023-08-21 VITALS — BP 111/69 | HR 87 | Temp 97.5°F | Resp 18 | Ht 65.0 in | Wt 192.1 lb

## 2023-08-21 DIAGNOSIS — Z0000 Encounter for general adult medical examination without abnormal findings: Secondary | ICD-10-CM | POA: Diagnosis not present

## 2023-08-21 NOTE — Patient Instructions (Signed)

## 2023-08-21 NOTE — Progress Notes (Signed)
 Phone [phone]   Subjective:   Patient is a 52 y.o. female presenting for annual physical.    Chief Complaint  Patient presents with   Annual Exam    CPE    cpd  See problem oriented charting- ROS- ROS: Gen: no fever, chills  Skin: no rash, itching ENT: no ear pain, ear drainage, nasal congestion, rhinorrhea, sinus pressure, sore throat Eyes: no blurry vision, double vision Resp: no cough, wheeze,SOB CV: no CP, palpitations, LE edema,  GI: no heartburn, n/v/d/c, abd pain GU: no dysuria, urgency, frequency, hematuria MSK: no joint pain, myalgias, back pain Neuro: no dizziness, headache, weakness, vertigo Psych: no depression, anxiety, insomnia, SI   The following were reviewed and entered/updated in epic: Past Medical History:  Diagnosis Date   Abnormal echocardiogram 11/28/2021   EF 60 -65%, abnormal global longitudinal strain, see cardiology OV in Epic dated 12/25/21   Allergy  4/25   See notes from Dr Burdette Carolin   Breast cancer [facility])    Cancer [facility]) 09/2020   left breast, ER+, s/p radiation therapy 12/25/20 - 01/18/21, Patient follows with Dr. Cameron Cea @ [facility].   COVID-19 11/2019   flu-like symptoms, cough, no hospitalizations   Edema    lower extremity, follows w/ [facility], [place], [place]. See OV dated 03/24/22 in Epic.   GERD (gastroesophageal reflux disease)    Follows w/ Dr. Leslye Rast, gastroenterologist and PCP, Dr. Retha Cast [facility] @ [facility].   Hashimoto's disease    Follows with Dr. Marianna Shirk @ [place].   History of hiatal hernia    2 cm hiatal hernia per 01/08/22 EGD   History of radiation therapy 2022   completed 01-18-2021   [facility] (hard of hearing)    has gotten bilateral aides.   Hypothyroidism    Follows w/ endocronologist, Dr. Marianna Shirk @ [place].   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 Hortensia Ma @ [facility].   Optic neuritis    Follows w/ opthamology, Dr. Margurette Shillings.03/2020 right eye (almost complete blindness), 04/2020 left eye   Personal history of radiation therapy    PONV (postoperative nausea and vomiting)    Status post dilation of esophageal narrowing 01/08/2022   EGD, esophagus dilated   Wears glasses    Patient Active Problem List   Diagnosis Date Noted   Influenza A 06/25/2023   High risk medication use 05/06/2023   Genetic testing 07/04/2022   Family history of uterine cancer 06/24/2022   Local edema 02/25/2022   Hypothyroidism due to Hashimoto's thyroiditis 06/07/2021   Malignant neoplasm of lower-inner quadrant of left breast in female, estrogen receptor positive (HCC) 10/23/2020   Optic neuritis, left 10/18/2020   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) 07/10/2020   Mitral regurgitation    Neuromyelitis optica (devic) (HCC) 05/07/2020   Hypothyroid 04/25/2020   Optic neuritis, right 04/24/2020   Past Surgical History:  Procedure Laterality Date   ABDOMINAL HYSTERECTOMY  04/23/22   BIOPSY BREAST Left 10/17/2020   BREAST LUMPECTOMY     BREAST LUMPECTOMY WITH RADIOACTIVE SEED AND SENTINEL LYMPH NODE BIOPSY Left 11/15/2020   Procedure: LEFT BREAST LUMPECTOMY WITH RADIOACTIVE SEED AND LEFT SENTINEL LYMPH NODE BIOPSY;  Surgeon: Sim Dryer, MD;  Location: [facility];  Service: General;  Laterality: Left;  BREAST SURGERY  August 2022   Cancer surgery   COLONOSCOPY  09/06/2021   cecal polyp   CT CORONARY CA SCORING  07/13/2020   Coronary Calcium = 0 (in Epic)   CYSTOSCOPY N/A 04/23/2022   Procedure: CYSTOSCOPY;  Surgeon: Lillian Rein, MD;  Location: [facility];  Service: Gynecology;  Laterality: N/A;   ESOPHAGOGASTRODUODENOSCOPY  01/08/2022   2cm hiatal hernia, esophagus dilated   TOTAL LAPAROSCOPIC HYSTERECTOMY WITH  SALPINGECTOMY Bilateral 04/23/2022   Procedure: TOTAL LAPAROSCOPIC HYSTERECTOMY BILATERAL SALPINGECTOMY, LEFT OOPHORECTOMY;  Surgeon: Lillian Rein, MD;  Location: [facility];  Service: Gynecology;  Laterality: Bilateral;   TRANSTHORACIC ECHOCARDIOGRAM  11/28/2021   see results in Epic   TRANSTHORACIC ECHOCARDIOGRAM  07/05/2020   in Epic   TUBAL LIGATION  02/2012   WISDOM TOOTH EXTRACTION     early 1990's    Family History  Problem Relation Age of Onset   Hearing loss Mother    Thyroid  disease Mother    Cancer Mother 56       endometrial cancer, lynch negative   GER disease Mother    Other Mother        uterine fibroids, optic disc abnormality    Eczema Mother    Colon polyps Mother    Hypothyroidism Mother    Osteoarthritis Mother    Hypertension Mother    Migraines Mother    Mitral valve prolapse Father        regurgitation/prolase   Colon polyps Father    Hearing loss Father    Other Father        Colon adenoma   Hyperlipidemia Father    GER disease Father    Heart murmur Father    Amblyopia Father        Right eye   Neuropathy Father    Aortic aneurysm Father    Heart murmur Brother    Glaucoma Maternal Grandmother    Macular degeneration Maternal Grandmother    Arthritis Maternal Grandmother    Arthritis Maternal Grandfather    Multiple sclerosis Paternal Grandmother    Heart murmur Paternal Grandfather    Heart disease Paternal Grandfather    Diabetes Maternal Aunt    Heart murmur Paternal Aunt    Breast cancer Neg Hx    Colon cancer Neg Hx    Esophageal cancer Neg Hx    Rectal cancer Neg Hx    Stomach cancer Neg Hx     Medications- reviewed and updated Current Outpatient Medications  Medication Sig Dispense Refill   ACTEMRA  162 MG/0.9ML SOSY Inject 0.9 mLs (162 mg total) into the skin once a week. For MOGAD Takes weekly on Saturday. 10.8 mL 1   ascorbic acid  (VITAMIN C) 1000 MG tablet Take 1 tablet by mouth daily.      cholecalciferol  (VITAMIN D3) 25 MCG (1000 UNIT) tablet Take 5,000 Units by mouth daily.     furosemide  (LASIX ) 20 MG tablet TAKE 1 TABLET BY MOUTH DAILY 90 tablet 3   levocetirizine (XYZAL ) 5 MG tablet Take 1 tablet (5 mg total) by mouth every evening. 90 tablet 0   levothyroxine  (SYNTHROID ) 88 MCG tablet Take by mouth 88 mcg six days a week 75 tablet 3   MAGNESIUM GLUCONATE PO Take 2 tablets by mouth daily at 12 noon.     omeprazole  (PRILOSEC) 40 MG capsule Take 1 capsule (40 mg total) by mouth daily. 90 capsule 3   Probiotic Product (CULTRELLE KIDS IMMUNE DEFENSE PO) Take  1 tablet by mouth daily.     UNABLE TO FIND Take 4 tablets by mouth daily. Med Name: Omega Pure 2 tablet in AM and 1 tablet in PM     UNABLE TO FIND Take by mouth daily. Med Name: Ultra Inflamix 2 scoops daily     UNABLE TO FIND Take 2 tablets by mouth daily. Med Name: R-Lipoic Acid     Olopatadine-Mometasone (RYALTRIS ) 665-25 MCG/ACT SUSP 1-2 sprays per nostril twice a day (Patient taking differently: daily. 1-2 sprays per nostril twice a day) 9 g 0   No current facility-administered medications for this visit.    Allergies-reviewed and updated No Known Allergies  Social History   Social History Narrative   Right handed    Caffeine use: rare   Objective  Objective:  BP 111/69   Pulse 87   Temp (!) 97.5 F (36.4 C) (Temporal)   Resp 18   Ht 5\' 5"  (1.651 m)   Wt 192 lb 2 oz (87.1 kg)   LMP 12/23/2021 (Exact Date)   SpO2 95%   BMI 31.97 kg/m  Physical Exam  Gen: WDWN NAD HEENT: NCAT, conjunctiva not injected, sclera nonicteric TM WNL B, OP moist, no exudates  NECK:  supple, no thyromegaly, no nodes, no carotid bruits CARDIAC: RRR, S1S2+, no murmur. DP 2+B LUNGS: CTAB. No wheezes ABDOMEN:  BS+, soft, NTND, No HSM, no masses EXT:  no edema MSK: no gross abnormalities. MS 5/5 all 4 NEURO: A&O x3.  CN II-XII intact.  PSYCH: normal mood. Good eye contact     Assessment and Plan   Health Maintenance  counseling: 1. Anticipatory guidance: Patient counseled regarding regular dental exams q6 months, eye exams,  avoiding smoking and second hand smoke, limiting alcohol to 1 beverage per day, no illicit drugs.   2. Risk factor reduction:  Advised patient of need for regular exercise and diet rich and fruits and vegetables to reduce risk of heart attack and stroke. Exercise- +.  Wt Readings from Last 3 Encounters:  08/21/23 192 lb 2 oz (87.1 kg)  07/27/23 190 lb 6 oz (86.4 kg)  07/14/23 190 lb 6.4 oz (86.4 kg)   3. Immunizations/screenings/ancillary studies Immunization History  Administered Date(s) Administered   Td 05/22/2015   Tdap 02/05/2007   There are no preventive care reminders to display for this patient.  4. Cervical cancer screening- utd 5. Breast cancer screening-  mammogram utd 6. Colon cancer screening - utd 7. Skin cancer screening- advised regular sunscreen use. Denies worrisome, changing, or new skin lesions.  8. Birth control/STD check- n/a 9. Osteoporosis screening- n/a 10. Smoking associated screening - non smoker  Wellness examination    Recommended follow up: No follow-ups on file.  Lab/Order associations:n/a fasting  Ellsworth Haas, MD

## 2023-08-22 ENCOUNTER — Encounter: Payer: Self-pay | Admitting: Family Medicine

## 2023-08-22 LAB — HEMOGLOBIN A1C
Hgb A1c MFr Bld: 5.5 % (ref <5.7)
Mean Plasma Glucose: 111 mg/dL
eAG (mmol/L): 6.2 mmol/L

## 2023-08-22 LAB — LIPID PANEL
Cholesterol: 188 mg/dL (ref <200)
HDL: 56 mg/dL (ref >=50)
LDL Cholesterol (Calc): 110 mg/dL — ABNORMAL HIGH
Non-HDL Cholesterol (Calc): 132 mg/dL — ABNORMAL HIGH (ref <130)
Total CHOL/HDL Ratio: 3.4 (calc) (ref <5.0)
Triglycerides: 114 mg/dL (ref <150)

## 2023-08-22 LAB — TSH: TSH: 2.52 m[IU]/L

## 2023-08-22 LAB — COMPREHENSIVE METABOLIC PANEL WITH GFR
AG Ratio: 1.6 (calc) (ref 1.0–2.5)
ALT: 29 U/L (ref 6–29)
AST: 20 U/L (ref 10–35)
Albumin: 3.8 g/dL (ref 3.6–5.1)
Alkaline phosphatase (APISO): 39 U/L (ref 37–153)
BUN: 22 mg/dL (ref 7–25)
CO2: 28 mmol/L (ref 20–32)
Calcium: 8.6 mg/dL (ref 8.6–10.4)
Chloride: 103 mmol/L (ref 98–110)
Creat: 0.97 mg/dL (ref 0.50–1.03)
Globulin: 2.4 g/dL (ref 1.9–3.7)
Glucose, Bld: 90 mg/dL (ref 65–99)
Potassium: 4.1 mmol/L (ref 3.5–5.3)
Sodium: 138 mmol/L (ref 135–146)
Total Bilirubin: 0.4 mg/dL (ref 0.2–1.2)
Total Protein: 6.2 g/dL (ref 6.1–8.1)
eGFR: 70 mL/min/{1.73_m2} (ref >=60)

## 2023-08-22 LAB — CBC WITH DIFFERENTIAL/PLATELET
Absolute Lymphocytes: 2143 {cells}/uL (ref 850–3900)
Absolute Monocytes: 882 {cells}/uL (ref 200–950)
Basophils Absolute: 30 {cells}/uL (ref 0–200)
Basophils Relative: 0.4 %
Eosinophils Absolute: 471 {cells}/uL (ref 15–500)
Eosinophils Relative: 6.2 %
HCT: 43.5 % (ref 35.0–45.0)
Hemoglobin: 14.4 g/dL (ref 11.7–15.5)
MCH: 31.8 pg (ref 27.0–33.0)
MCHC: 33.1 g/dL (ref 32.0–36.0)
MCV: 96 fL (ref 80.0–100.0)
MPV: 9.5 fL (ref 7.5–12.5)
Monocytes Relative: 11.6 %
Neutro Abs: 4074 {cells}/uL (ref 1500–7800)
Neutrophils Relative %: 53.6 %
Platelets: 190 10*3/uL (ref 140–400)
RBC: 4.53 10*6/uL (ref 3.80–5.10)
RDW: 11.8 % (ref 11.0–15.0)
Total Lymphocyte: 28.2 %
WBC: 7.6 10*3/uL (ref 3.8–10.8)

## 2023-08-22 NOTE — Progress Notes (Signed)
 Labs are great

## 2023-08-25 ENCOUNTER — Ambulatory Visit: Payer: Self-pay | Admitting: *Deleted

## 2023-09-01 ENCOUNTER — Other Ambulatory Visit: Payer: Self-pay | Admitting: Internal Medicine

## 2023-09-01 DIAGNOSIS — E063 Autoimmune thyroiditis: Secondary | ICD-10-CM

## 2023-09-10 ENCOUNTER — Telehealth: Admitting: Skilled Nursing Facility1

## 2023-09-11 ENCOUNTER — Encounter: Payer: Self-pay | Admitting: Hematology and Oncology

## 2023-09-16 ENCOUNTER — Other Ambulatory Visit (HOSPITAL_BASED_OUTPATIENT_CLINIC_OR_DEPARTMENT_OTHER): Payer: Self-pay

## 2023-09-16 ENCOUNTER — Ambulatory Visit: Admitting: Family Medicine

## 2023-09-16 ENCOUNTER — Encounter: Payer: Self-pay | Admitting: Family Medicine

## 2023-09-16 VITALS — BP 112/82 | HR 80 | Temp 97.6°F | Resp 16 | Ht 65.0 in | Wt 193.0 lb

## 2023-09-16 DIAGNOSIS — H9201 Otalgia, right ear: Secondary | ICD-10-CM

## 2023-09-16 MED ORDER — PREDNISONE 20 MG PO TABS
40.0000 mg | ORAL_TABLET | Freq: Every day | ORAL | 0 refills | Status: AC
  Filled 2023-09-16: qty 10, 5d supply, fill #0

## 2023-09-16 MED ORDER — MECLIZINE HCL 25 MG PO TABS
25.0000 mg | ORAL_TABLET | Freq: Three times a day (TID) | ORAL | 1 refills | Status: DC | PRN
  Filled 2023-09-16: qty 10, 4d supply, fill #0

## 2023-09-16 NOTE — Progress Notes (Signed)
 Subjective:     Patient ID: Valerie [name], female    DOB: 1970/10/29, 54 y.o.   MRN: [id number]  Chief Complaint  Patient presents with   Ear Pain    Right side ear pain that started this morning Had some dizziness last night before bed    HPI Discussed the use of AI scribe software for clinical note transcription with the patient, who gave verbal consent to proceed.  History of Present Illness Valerie [name] is a 53 year old female with a history of vertigo who presents with dizziness and right ear pain.  She has been experiencing dizziness and right ear pain since last night. The ear pain is described as 'little pulses' that are painful and occur intermittently. The dizziness was mild enough to allow her to function, but she felt 'off' on Monday prior to the onset of ear pain. No fever, drainage, runny nose, congestion, weakness of the eye muscles, or nausea.  She has a history of vertigo and is currently on allergy  medications. She recalls a similar episode of vertigo about a year ago, which was managed with medications she believes she has at home. She is currently taking Lasix  and has previously been prescribed meclizine  for vertigo. She mentions a familial history of dizziness and has not undergone vestibular rehabilitation.  She reports a history of optic neuritis related to MOGAD, which caused temporary blindness and eye pain in the past. She continues to experience throat clearing, which her allergist has noted, and she is on reflux medication. She has an upcoming appointment with her allergist and a mammogram scheduled for tomorrow.    There are no preventive care reminders to display for this patient.  Past Medical History:  Diagnosis Date   Abnormal echocardiogram 11/28/2021   EF 60 -65%, abnormal global longitudinal strain, see cardiology OV in Epic dated 12/25/21   Allergy  4/25   See notes from Dr Burdette Carolin   Breast cancer [facility])    Cancer [facility]) 09/2020    left breast, ER+, s/p radiation therapy 12/25/20 - 01/18/21, Patient follows with Dr. Cameron Cea @ [facility].   COVID-19 11/2019   flu-like symptoms, cough, no hospitalizations   Edema    lower extremity, follows w/ [facility] [place], [place]. See OV dated 03/24/22 in Epic.   GERD (gastroesophageal reflux disease)    Follows w/ Dr. Leslye Rast, gastroenterologist and PCP, Dr. Retha Cast [facility] @ [facility].   Hashimoto's disease    Follows with Dr. Marianna Shirk @ [place].   History of hiatal hernia    2 cm hiatal hernia per 01/08/22 EGD   History of radiation therapy 2022   completed 01-18-2021   [facility] (hard of hearing)    has gotten bilateral aides.   Hypothyroidism    Follows w/ endocronologist, Dr. Marianna Shirk @ [place].   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 Hortensia Ma @ [facility].   Optic neuritis    Follows w/ opthamology, Dr. Margurette Shillings.03/2020 right eye (almost complete blindness), 04/2020 left eye   Personal history of radiation therapy    PONV (postoperative nausea and vomiting)    Status post dilation of esophageal narrowing 01/08/2022   EGD, esophagus dilated   Wears glasses     Past Surgical History:  Procedure Laterality  Date   ABDOMINAL HYSTERECTOMY  04/23/22   BIOPSY BREAST Left 10/17/2020   BREAST LUMPECTOMY     BREAST LUMPECTOMY WITH RADIOACTIVE SEED AND SENTINEL LYMPH NODE BIOPSY Left 11/15/2020   Procedure: LEFT BREAST LUMPECTOMY WITH RADIOACTIVE SEED AND LEFT SENTINEL LYMPH NODE BIOPSY;  Surgeon: Sim Dryer, MD;  Location: [facility];  Service: General;  Laterality: Left;   BREAST SURGERY  August 2022   Cancer surgery   COLONOSCOPY  09/06/2021   cecal polyp   CT CORONARY CA SCORING  07/13/2020   Coronary Calcium = 0 (in Epic)    CYSTOSCOPY N/A 04/23/2022   Procedure: CYSTOSCOPY;  Surgeon: Lillian Rein, MD;  Location: [facility];  Service: Gynecology;  Laterality: N/A;   ESOPHAGOGASTRODUODENOSCOPY  01/08/2022   2cm hiatal hernia, esophagus dilated   TOTAL LAPAROSCOPIC HYSTERECTOMY WITH SALPINGECTOMY Bilateral 04/23/2022   Procedure: TOTAL LAPAROSCOPIC HYSTERECTOMY BILATERAL SALPINGECTOMY, LEFT OOPHORECTOMY;  Surgeon: Lillian Rein, MD;  Location: [facility];  Service: Gynecology;  Laterality: Bilateral;   TRANSTHORACIC ECHOCARDIOGRAM  11/28/2021   see results in Epic   TRANSTHORACIC ECHOCARDIOGRAM  07/05/2020   in Epic   TUBAL LIGATION  02/2012   WISDOM TOOTH EXTRACTION     early 1990's     Current Outpatient Medications:    ACTEMRA  162 MG/0.9ML SOSY, Inject 0.9 mLs (162 mg total) into the skin once a week. For MOGAD Takes weekly on Saturday., Disp: 10.8 mL, Rfl: 1   ascorbic acid  (VITAMIN C) 1000 MG tablet, Take 1 tablet by mouth daily., Disp: , Rfl:    cholecalciferol  (VITAMIN D3) 25 MCG (1000 UNIT) tablet, Take 5,000 Units by mouth daily., Disp: , Rfl:    furosemide  (LASIX ) 20 MG tablet, TAKE 1 TABLET BY MOUTH DAILY, Disp: 90 tablet, Rfl: 3   levocetirizine (XYZAL ) 5 MG tablet, Take 1 tablet (5 mg total) by mouth every evening., Disp: 90 tablet, Rfl: 0   levothyroxine  (SYNTHROID ) 88 MCG tablet, TAKE 1 TABLET BY MOUTH 6 DAYS  PER WEEK, Disp: 78 tablet, Rfl: 3   MAGNESIUM GLUCONATE PO, Take 2 tablets by mouth daily at 12 noon., Disp: , Rfl:    meclizine  (ANTIVERT ) 25 MG tablet, Take 1 tablet (25 mg total) by mouth 3 (three) times daily as needed for dizziness., Disp: 10 tablet, Rfl: 1   Olopatadine-Mometasone (RYALTRIS ) 665-25 MCG/ACT SUSP, 1-2 sprays per nostril twice a day (Patient taking differently: daily. 1-2 sprays per nostril twice a day), Disp: 9 g, Rfl: 0   omeprazole  (PRILOSEC) 40 MG capsule, Take 1 capsule (40 mg total) by mouth daily., Disp: 90 capsule, Rfl: 3    predniSONE  (DELTASONE ) 20 MG tablet, Take 2 tablets (40 mg total) by mouth daily with breakfast for 5 days., Disp: 10 tablet, Rfl: 0   Probiotic Product (CULTRELLE KIDS IMMUNE DEFENSE PO), Take 1 tablet by mouth daily., Disp: , Rfl:    SUNSCREEN SPF30 EX, apply topically to face and body daily, Disp: , Rfl:    UNABLE TO FIND, Take 4 tablets by mouth daily. Med Name: Omega Pure 2 tablet in AM and 1 tablet in PM, Disp: , Rfl:    UNABLE TO FIND, Take by mouth daily. Med Name: Ultra Inflamix 2 scoops daily, Disp: , Rfl:    UNABLE TO FIND, Take 2 tablets by mouth daily. Med Name: R-Lipoic Acid, Disp: , Rfl:   No Known Allergies ROS neg/noncontributory except as noted HPI/below      Objective:  BP 112/82   Pulse 80   Temp 97.6 F (36.4 C) (Temporal)   Resp 16   Ht 5\' 5"  (1.651 m)   Wt 193 lb (87.5 kg)   LMP 12/23/2021 (Exact Date)   SpO2 95%   BMI 32.12 kg/m  Wt Readings from Last 3 Encounters:  09/16/23 193 lb (87.5 kg)  08/21/23 192 lb 2 oz (87.1 kg)  07/27/23 190 lb 6 oz (86.4 kg)    Physical Exam   Gen: WDWN NAD HEENT: NCAT, conjunctiva not injected, sclera nonicteric.  Eomi.  No nystagmus TM WNL B, OP moist, no exudates .  Clearing throat.  NECK:  supple, no thyromegaly, no nodes, no carotid bruits CARDIAC: RRR, S1S2+,.  LUNGS: CTAB. No wheezes EXT:  no edema MSK: no gross abnormalities.  NEURO: A&O x3.  CN II-XII intact. F-n,ram,pronator drift neg PSYCH: normal mood. Good eye contact     Assessment & Plan:  Right ear pain  Other orders -     predniSONE ; Take 2 tablets (40 mg total) by mouth daily with breakfast for 5 days.  Dispense: 10 tablet; Refill: 0 -     Meclizine  HCl; Take 1 tablet (25 mg total) by mouth 3 (three) times daily as needed for dizziness.  Dispense: 10 tablet; Refill: 1  Assessment and Plan Assessment & Plan Right Ear Pain with Dizziness   Dizziness and pulsating pain in the right ear began last night. Dizziness is mild and  non-impairing, with pain occurring in brief pulses. There is no fever, drainage, or congestion. The differential diagnosis includes a viral infection of the inner ear or a weather-related migraine. Shingles is not suspected, but she should monitor for a rash. She has a history of vertigo, previously thought to be allergy -related, and is currently on allergy  medications. Prednisone  is considered to reduce symptoms and is more effective if started early. Meclizine  is considered for dizziness but may cause drowsiness. She should monitor for shingles rash and Bell's palsy symptoms, seeking immediate evaluation if facial weakness occurs. Prescribe prednisone , two pills once daily for five days with food, and meclizine  for dizziness management. Advise monitoring for shingles rash and contact if it appears. Discuss the potential for Bell's palsy and advise seeking immediate evaluation if facial weakness occurs.  Chronic Throat Clearing   Persistent throat clearing is possibly allergy -related. The allergist suggests a potential ENT referral if symptoms persist. She is currently on allergy  medications with an upcoming allergist appointment. Consider an ENT referral after the allergist evaluation if symptoms persist.  MOGAD (Myelin Oligodendrocyte Glycoprotein Antibody Disease)   She has MOGAD with previous optic neuritis symptoms, including eye pain and temporary blindness, but is currently asymptomatic. Her last brain scan was in 2022, and a neurologist appointment is scheduled for August.  General Health Maintenance   She is scheduled for a mammogram tomorrow as part of routine health maintenance. She has regular follow-ups with specialists, including an oncologist and nutritionist. Ensure completion of the scheduled mammogram.  Follow-up   Upcoming appointments include the allergist on June 10th, neurologist in August, nutritionist on July 25th, and radiology for a mammogram tomorrow. Follow up with the  allergist on June 10th, attend the neurologist appointment in August, attend the nutritionist appointment on July 25th, and complete the mammogram appointment tomorrow.    Return if symptoms worsen or fail to improve.  Ellsworth Haas, MD

## 2023-09-16 NOTE — Patient Instructions (Signed)
 Keep up the great work.  Try free weights  Meds sent to pharmacy  Keep me posted

## 2023-09-17 ENCOUNTER — Other Ambulatory Visit: Payer: Self-pay | Admitting: Hematology and Oncology

## 2023-09-17 ENCOUNTER — Ambulatory Visit
Admission: RE | Admit: 2023-09-17 | Discharge: 2023-09-17 | Disposition: A | Discharge status: Home / Self Care | Source: Ambulatory Visit | Attending: Hematology and Oncology | Admitting: Hematology and Oncology

## 2023-09-17 ENCOUNTER — Ambulatory Visit
Admission: RE | Admit: 2023-09-17 | Discharge: 2023-09-17 | Disposition: A | Discharge status: Home / Self Care | Source: Ambulatory Visit | Attending: Hematology and Oncology

## 2023-09-17 DIAGNOSIS — Z853 Personal history of malignant neoplasm of breast: Secondary | ICD-10-CM

## 2023-09-17 DIAGNOSIS — Z9889 Other specified postprocedural states: Secondary | ICD-10-CM

## 2023-09-21 NOTE — Progress Notes (Unsigned)
 Follow Up Note  RE: Valerie [name] MRN: [id number] DOB: [birth year] Date of Office Visit: 09/22/2023  Referring provider: Christel Cousins, MD Primary care provider: Christel Cousins, MD  Chief Complaint: No chief complaint on file.  History of Present Illness: I had the pleasure of seeing Valerie [name] for a follow up visit at the [facility] on 09/21/2023. She is a 53 y.o. female, who is being followed for allergic rhinitis, GERD, cough. Her previous allergy  office visit was on 07/21/2023 with Dr. Burdette Carolin. Today is a regular follow up visit.  Discussed the use of AI scribe software for clinical note transcription with the patient, who gave verbal consent to proceed.  History of Present Illness            ***  Assessment and Plan: Valerie [name] is a 53 y.o. female with: Other allergic rhinitis Chronic throat clearing Past history - chronic throat clearing for approximately 15 years without a clear seasonal pattern. Possible contributing factors include postnasal drip and reflux. No significant nasal symptoms such as congestion, sneezing, or itchy eyes. Today's skin testing positive to grass, weed, trees, mold, dust mites, cat, horse. Borderline to eggs - monitor symptoms after you eat eggs.  Start environmental control measures as below. Use over the counter antihistamines such as Zyrtec (cetirizine), Claritin (loratadine), Allegra (fexofenadine), or Xyzal  (levocetirizine) daily as needed. May take twice a day during allergy  flares. May switch antihistamines every few months. Start Ryaltris  (olopatadine + mometasone nasal spray combination) 1-2 sprays per nostril twice a day. Sample given. This replaces your other nasal sprays. If this works well for you, then have pharmacy ship the medication to your home - prescription already sent in.  Nasal saline spray (i.e., Simply Saline) or nasal saline lavage (i.e., NeilMed) is recommended as needed and prior to medicated  nasal sprays. Consider allergy  injections for long term control if above medications do not help the symptoms - handout given.  2 injections.  If no improvement in the throat clearing with above regimen then will refer to ENT next - Dr. Miki Alert.    Gastroesophageal reflux disease, unspecified whether esophagitis present Past history - Prednisone  use for MOGAD disease may have exacerbated reflux in the past. Follows with GI.  Continue lifestyle and dietary modifications. Continue omeprazole  40mg  once day - nothing to eat or drink for 20-30 minutes afterwards.    Subacute cough Past history - recent flu in early March with persistent cough that has mostly resolved. No recent sinus infections. Cough improved but still present at night, causing sleep disturbances. No prior history of asthma/inhaler use. 2025 spirometry was normal. Monitor symptoms.    Assessment and Plan              No follow-ups on file.  No orders of the defined types were placed in this encounter.  Lab Orders  No laboratory test(s) ordered today    Diagnostics: Spirometry:  Tracings reviewed. Her effort: {Blank single:19197::"Good reproducible efforts.","It was hard to get consistent efforts and there is a question as to whether this reflects a maximal maneuver.","Poor effort, data can not be interpreted."} FVC: ***L FEV1: ***L, ***% predicted FEV1/FVC ratio: ***% Interpretation: {Blank single:19197::"Spirometry consistent with mild obstructive disease","Spirometry consistent with moderate obstructive disease","Spirometry consistent with severe obstructive disease","Spirometry consistent with possible restrictive disease","Spirometry consistent with mixed obstructive and restrictive disease","Spirometry uninterpretable due to technique","Spirometry consistent with normal pattern","No overt abnormalities noted given today's efforts"}.  Please see scanned spirometry results for details.  Skin Testing: {Blank  single:19197::"Select foods","Environmental allergy  panel","Environmental allergy  panel and select foods","Food allergy  panel","None","Deferred due to recent antihistamines use"}. *** Results discussed with patient/family.   Medication List:  Current Outpatient Medications  Medication Sig Dispense Refill  . ACTEMRA  162 MG/0.9ML SOSY Inject 0.9 mLs (162 mg total) into the skin once a week. For MOGAD Takes weekly on Saturday. 10.8 mL 1  . ascorbic acid  (VITAMIN C) 1000 MG tablet Take 1 tablet by mouth daily.    . cholecalciferol  (VITAMIN D3) 25 MCG (1000 UNIT) tablet Take 5,000 Units by mouth daily.    . furosemide  (LASIX ) 20 MG tablet TAKE 1 TABLET BY MOUTH DAILY 90 tablet 3  . levocetirizine (XYZAL ) 5 MG tablet Take 1 tablet (5 mg total) by mouth every evening. 90 tablet 0  . levothyroxine  (SYNTHROID ) 88 MCG tablet TAKE 1 TABLET BY MOUTH 6 DAYS  PER WEEK 78 tablet 3  . MAGNESIUM GLUCONATE PO Take 2 tablets by mouth daily at 12 noon.    . meclizine  (ANTIVERT ) 25 MG tablet Take 1 tablet (25 mg total) by mouth 3 (three) times daily as needed for dizziness. 10 tablet 1  . Olopatadine-Mometasone (RYALTRIS ) 665-25 MCG/ACT SUSP 1-2 sprays per nostril twice a day (Patient taking differently: daily. 1-2 sprays per nostril twice a day) 9 g 0  . omeprazole  (PRILOSEC) 40 MG capsule Take 1 capsule (40 mg total) by mouth daily. 90 capsule 3  . predniSONE  (DELTASONE ) 20 MG tablet Take 2 tablets (40 mg total) by mouth daily with breakfast for 5 days. 10 tablet 0  . Probiotic Product (CULTRELLE KIDS IMMUNE DEFENSE PO) Take 1 tablet by mouth daily.    . SUNSCREEN SPF30 EX apply topically to face and body daily    . UNABLE TO FIND Take 4 tablets by mouth daily. Med Name: Omega Pure 2 tablet in AM and 1 tablet in PM    . UNABLE TO FIND Take by mouth daily. Med Name: Ultra Inflamix 2 scoops daily    . UNABLE TO FIND Take 2 tablets by mouth daily. Med Name: R-Lipoic Acid     No current facility-administered  medications for this visit.   Allergies: No Known Allergies I reviewed her past medical history, social history, family history, and environmental history and no significant changes have been reported from her previous visit.  Review of Systems  Constitutional:  Negative for appetite change, chills, fever and unexpected weight change.  HENT:  Negative for congestion, postnasal drip, rhinorrhea, sneezing and sore throat.        Throat clearing  Eyes:  Negative for itching.  Respiratory:  Positive for cough. Negative for chest tightness, shortness of breath and wheezing.   Cardiovascular:  Negative for chest pain.  Gastrointestinal:  Negative for abdominal pain.  Genitourinary:  Negative for difficulty urinating.  Skin:  Negative for rash.  Allergic/Immunologic: Positive for environmental allergies.  Neurological:  Negative for headaches.    Objective: LMP 12/23/2021 (Exact Date)  There is no height or weight on file to calculate BMI. Physical Exam Vitals and nursing note reviewed.  Constitutional:      Appearance: Normal appearance. She is well-developed.  HENT:     Head: Normocephalic and atraumatic.     Right Ear: Tympanic membrane and external ear normal.     Left Ear: Tympanic membrane and external ear normal.     Nose: Nose normal.     Mouth/Throat:     Mouth: Mucous membranes are moist.  Pharynx: Oropharynx is clear.  Eyes:     Conjunctiva/sclera: Conjunctivae normal.  Cardiovascular:     Rate and Rhythm: Normal rate and regular rhythm.     Heart sounds: Normal heart sounds. No murmur heard.    No friction rub. No gallop.  Pulmonary:     Effort: Pulmonary effort is normal.     Breath sounds: Normal breath sounds. No wheezing, rhonchi or rales.  Musculoskeletal:     Cervical back: Neck supple.  Skin:    General: Skin is warm.     Findings: No rash.  Neurological:     Mental Status: She is alert and oriented to person, place, and time.  Psychiatric:         Behavior: Behavior normal.   Previous notes and tests were reviewed. The plan was reviewed with the patient/family, and all questions/concerned were addressed.  It was my pleasure to see Saidee today and participate in her care. Please feel free to contact me with any questions or concerns.  Sincerely,  Eudelia Hero, DO Allergy  & Immunology  [facility]  [place] office: [phone] [facility] office: [phone]

## 2023-09-22 ENCOUNTER — Ambulatory Visit: Admitting: Allergy

## 2023-09-22 ENCOUNTER — Encounter: Payer: Self-pay | Admitting: Allergy

## 2023-09-22 ENCOUNTER — Telehealth: Payer: Self-pay | Admitting: Allergy

## 2023-09-22 VITALS — BP 112/80 | HR 74 | Temp 97.9°F | Wt 195.8 lb

## 2023-09-22 DIAGNOSIS — K219 Gastro-esophageal reflux disease without esophagitis: Secondary | ICD-10-CM | POA: Diagnosis not present

## 2023-09-22 DIAGNOSIS — R0989 Other specified symptoms and signs involving the circulatory and respiratory systems: Secondary | ICD-10-CM

## 2023-09-22 DIAGNOSIS — R42 Dizziness and giddiness: Secondary | ICD-10-CM | POA: Diagnosis not present

## 2023-09-22 DIAGNOSIS — J3089 Other allergic rhinitis: Secondary | ICD-10-CM | POA: Diagnosis not present

## 2023-09-22 MED ORDER — OMEPRAZOLE 40 MG PO CPDR: 40.0000 mg | DELAYED_RELEASE_CAPSULE | Freq: Two times a day (BID) | ORAL | 1 refills | Status: DC

## 2023-09-22 NOTE — Patient Instructions (Addendum)
 Environmental allergies 2025 skin testing positive to grass, weed, trees, mold, dust mites, cat, horse.  Continue environmental control measures as below. Use over the counter antihistamines such as Zyrtec (cetirizine), Claritin (loratadine), Allegra (fexofenadine), or Xyzal  (levocetirizine) daily as needed. May take twice a day during allergy  flares. May switch antihistamines every few months. Start Ryaltris  (olopatadine + mometasone nasal spray combination) 1-2 sprays per nostril TWICE a day.  Nasal saline spray (i.e., Simply Saline) or nasal saline lavage (i.e., NeilMed) is recommended as needed and prior to medicated nasal sprays. Consider allergy  injections for long term control if above medications do not help the symptoms.  Refer to ENT due to persistent throat clearing - Dr. Miki Alert.  If she's booked out, will refer to [facility] ENT.   Reflux Continue lifestyle and dietary modifications. Start omeprazole  40mg  twice a day for 1 month and then go back to once a day - nothing to eat or drink for 20-30 minutes afterwards.   Coughing Monitor symptoms.   Return in about 3 months (around 12/23/2023). Or sooner if needed.   Reducing Pollen Exposure Pollen seasons: trees (spring), grass (summer) and ragweed/weeds (fall). Keep windows closed in your home and car to lower pollen exposure.  Install air conditioning in the bedroom and throughout the house if possible.  Avoid going out in dry windy days - especially early morning. Pollen counts are highest between 5 - 10 AM and on dry, hot and windy days.  Save outside activities for late afternoon or after a heavy rain, when pollen levels are lower.  Avoid mowing of grass if you have grass pollen allergy . Be aware that pollen can also be transported indoors on people and pets.  Dry your clothes in an automatic dryer rather than hanging them outside where they might collect pollen.  Rinse hair and eyes before bedtime.  Mold Control Mold and fungi  can grow on a variety of surfaces provided certain temperature and moisture conditions exist.  Outdoor molds grow on plants, decaying vegetation and soil. The major outdoor mold, Alternaria and Cladosporium, are found in very high numbers during hot and dry conditions. Generally, a late summer - fall peak is seen for common outdoor fungal spores. Rain will temporarily lower outdoor mold spore count, but counts rise rapidly when the rainy period ends. The most important indoor molds are Aspergillus and Penicillium. Dark, humid and poorly ventilated basements are ideal sites for mold growth. The next most common sites of mold growth are the bathroom and the kitchen. Outdoor (Seasonal) Mold Control Use air conditioning and keep windows closed. Avoid exposure to decaying vegetation. Avoid leaf raking. Avoid grain handling. Consider wearing a face mask if working in moldy areas.  Indoor (Perennial) Mold Control  Maintain humidity below 50%. Get rid of mold growth on hard surfaces with water, detergent and, if necessary, 5% bleach (do not mix with other cleaners). Then dry the area completely. If mold covers an area more than 10 square feet, consider hiring an indoor environmental professional. For clothing, washing with soap and water is best. If moldy items cannot be cleaned and dried, throw them away. Remove sources e.g. contaminated carpets. Repair and seal leaking roofs or pipes. Using dehumidifiers in damp basements may be helpful, but empty the water and clean units regularly to prevent mildew from forming. All rooms, especially basements, bathrooms and kitchens, require ventilation and cleaning to deter mold and mildew growth. Avoid carpeting on concrete or damp floors, and storing items in damp areas. Control  of House Dust Mite Allergen Dust mite allergens are a common trigger of allergy  and asthma symptoms. While they can be found throughout the house, these microscopic creatures thrive in warm,  humid environments such as bedding, upholstered furniture and carpeting. Because so much time is spent in the bedroom, it is essential to reduce mite levels there.  Encase pillows, mattresses, and box springs in special allergen-proof fabric covers or airtight, zippered plastic covers.  Bedding should be washed weekly in hot water (130 F) and dried in a hot dryer. Allergen-proof covers are available for comforters and pillows that can't be regularly washed.  Wash the allergy -proof covers every few months. Minimize clutter in the bedroom. Keep pets out of the bedroom.  Keep humidity less than 50% by using a dehumidifier or air conditioning. You can buy a humidity measuring device called a hygrometer to monitor this.  If possible, replace carpets with hardwood, linoleum, or washable area rugs. If that's not possible, vacuum frequently with a vacuum that has a HEPA filter. Remove all upholstered furniture and non-washable window drapes from the bedroom. Remove all non-washable stuffed toys from the bedroom.  Wash stuffed toys weekly. Pet Allergen Avoidance: Contrary to popular opinion, there are no "hypoallergenic" breeds of dogs or cats. That is because people are not allergic to an animal's hair, but to an allergen found in the animal's saliva, dander (dead skin flakes) or urine. Pet allergy  symptoms typically occur within minutes. For some people, symptoms can build up and become most severe 8 to 12 hours after contact with the animal. People with severe allergies can experience reactions in public places if dander has been transported on the pet owners' clothing. Keeping an animal outdoors is only a partial solution, since homes with pets in the yard still have higher concentrations of animal allergens. Before getting a pet, ask your allergist to determine if you are allergic to animals. If your pet is already considered part of your family, try to minimize contact and keep the pet out of the bedroom  and other rooms where you spend a great deal of time. As with dust mites, vacuum carpets often or replace carpet with a hardwood floor, tile or linoleum. High-efficiency particulate air (HEPA) cleaners can reduce allergen levels over time. While dander and saliva are the source of cat and dog allergens, urine is the source of allergens from rabbits, hamsters, mice and [place] pigs; so ask a non-allergic family member to clean the animal's cage. If you have a pet allergy , talk to your allergist about the potential for allergy  immunotherapy (allergy  shots). This strategy can often provide long-term relief.

## 2023-09-22 NOTE — Telephone Encounter (Signed)
 Please place referral to Dr. Miki Alert ENT for chronic throat clearing.  If she's booking out too far out then okay to refer to [facility] ENT.  Thank you.

## 2023-09-29 ENCOUNTER — Encounter (INDEPENDENT_AMBULATORY_CARE_PROVIDER_SITE_OTHER): Payer: Self-pay

## 2023-10-07 ENCOUNTER — Telehealth: Admitting: Skilled Nursing Facility1

## 2023-10-11 ENCOUNTER — Encounter: Payer: Self-pay | Admitting: Neurology

## 2023-10-18 ENCOUNTER — Encounter: Payer: Self-pay | Admitting: Internal Medicine

## 2023-10-19 ENCOUNTER — Encounter: Payer: Self-pay | Admitting: Family Medicine

## 2023-10-19 ENCOUNTER — Other Ambulatory Visit: Payer: Self-pay | Admitting: Allergy

## 2023-10-19 ENCOUNTER — Other Ambulatory Visit: Payer: Self-pay

## 2023-10-19 NOTE — Telephone Encounter (Signed)
 Sent mychart message to pt regarding the omeprazole  change.

## 2023-10-21 ENCOUNTER — Encounter: Payer: Self-pay | Admitting: Neurology

## 2023-10-21 ENCOUNTER — Encounter: Payer: Self-pay | Admitting: Skilled Nursing Facility1

## 2023-10-21 ENCOUNTER — Encounter: Attending: Family Medicine | Admitting: Skilled Nursing Facility1

## 2023-10-21 DIAGNOSIS — E663 Overweight: Secondary | ICD-10-CM | POA: Diagnosis present

## 2023-10-21 NOTE — Progress Notes (Signed)
 Medical Nutrition Therapy  Virtual appt: pt identified by name and DOB, pt agreeable to limitations of this visit type  Primary concerns today: weight loss  Referral diagnosis: GERD  NUTRITION ASSESSMENT    Clinical Medical Hx: GERD, Cancer, Hashimotos Medications:  Labs:  Notable Signs/Symptoms:   Lifestyle & Dietary Hx  Pt asks how many calories should she be eating and wanted recipes for sweets that were healthier than the usual.  Pt states she has brain fog, excessive day time sleepiness and swelling in her legs: Dietitian advised pt to write down all of her symptoms and report this to her doctor.  Pt state she is working with a therapist now stating it is going well.  Estimated daily fluid intake:  oz Supplements:  Sleep:  Stress / self-care:  Current average weekly physical activity: 5 days a week: walking and upper body, 2 days a week laps in the pool  24-Hr Dietary Recall First Meal: protein shake (protein powder with water)  Snack: ultra infalmex shake with a bunch of supplements  Second Meal 11:30: leftovers + veggies + pasta Snack:  Third Meal: eaten out Snack:  Beverages: water    NUTRITION INTERVENTION  Nutrition education (E-1) on the following topics:  Macronutrient distribution Caloric value recommendations   Handouts Previously Provided Include  Detailed MyPlate Micro/Macro  Learning Style & Readiness for Change Teaching method utilized: Visual & Auditory  Demonstrated degree of understanding via: Teach Back  Barriers to learning/adherence to lifestyle change: unidentified  Goals Established by Pt Try [place] dessert recipes Choose whole grain crackers  Create a balanced breakfast   MONITORING & EVALUATION Dietary intake, weekly physical activity  Next Steps  Patient is to call our office tomorrow to make a follow up appt for 4 weeks from now.

## 2023-10-23 ENCOUNTER — Encounter: Payer: Self-pay | Admitting: Rehabilitation

## 2023-10-26 ENCOUNTER — Ambulatory Visit: Attending: Surgery

## 2023-10-26 VITALS — Wt 192.5 lb

## 2023-10-26 DIAGNOSIS — Z483 Aftercare following surgery for neoplasm: Secondary | ICD-10-CM | POA: Insufficient documentation

## 2023-10-26 NOTE — Telephone Encounter (Signed)
 Please review patient message and advise on if appt is still needed

## 2023-10-26 NOTE — Therapy (Signed)
 OUTPATIENT PHYSICAL THERAPY SOZO SCREENING NOTE   Patient Name: Valerie [name] MRN: [id number] DOB:[birth year], 53 y.o., female Today's Date: 10/26/2023  PCP: Wendolyn Jenkins Jansky, MD REFERRING PROVIDER: Vanderbilt Ned, MD   PT End of Session - 10/26/23 0912     Visit Number 24   # unchanged due to screen only   PT Start Time 0911    PT Stop Time 0924    PT Time Calculation (min) 13 min    Activity Tolerance Patient tolerated treatment well    Behavior During Therapy [facility] for tasks assessed/performed          Past Medical History:  Diagnosis Date   Abnormal echocardiogram 11/28/2021   EF 60 -65%, abnormal global longitudinal strain, see cardiology OV in Epic dated 12/25/21   Allergy  4/25   See notes from Dr Luke   Breast cancer [facility])    Cancer [facility]) 09/2020   left breast, ER+, s/p radiation therapy 12/25/20 - 01/18/21, Patient follows with Dr. Mackey Chad @ [facility].   COVID-19 11/2019   flu-like symptoms, cough, no hospitalizations   Edema    lower extremity, follows w/ [facility], Sherrilee Dinning, [place]. See OV dated 03/24/22 in Epic.   GERD (gastroesophageal reflux disease)    Follows w/ Dr. Rosario Guppy, gastroenterologist and PCP, Dr. Jenkins Jansky [facility] @ [facility].   Hashimoto's disease    Follows with Dr. Tawni Fendt @ [place].   History of hiatal hernia    2 cm hiatal hernia per 01/08/22 EGD   History of radiation therapy 2022   completed 01-18-2021   [facility] (hard of hearing)    has gotten bilateral aides.   Hypothyroidism    Follows w/ endocronologist, Dr. Tawni Fendt @ [place].   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 Charlie Crete @ [facility]rology.   Optic neuritis    Follows w/ opthamology, Dr. 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), 04/2020 left eye    Personal history of radiation therapy    PONV (postoperative nausea and vomiting)    Status post dilation of esophageal narrowing 01/08/2022   EGD, esophagus dilated   Wears glasses    Past Surgical History:  Procedure Laterality Date   ABDOMINAL HYSTERECTOMY  04/23/22   BIOPSY BREAST Left 10/17/2020   BREAST LUMPECTOMY     BREAST LUMPECTOMY WITH RADIOACTIVE SEED AND SENTINEL LYMPH NODE BIOPSY Left 11/15/2020   Procedure: LEFT BREAST LUMPECTOMY WITH RADIOACTIVE SEED AND LEFT SENTINEL LYMPH NODE BIOPSY;  Surgeon: Vanderbilt Ned, MD;  Location: [facility];  Service: General;  Laterality: Left;   BREAST SURGERY  August 2022   Cancer surgery   COLONOSCOPY  09/06/2021   cecal polyp   CT CORONARY CA SCORING  07/13/2020   Coronary Calcium = 0 (in Epic)   CYSTOSCOPY N/A 04/23/2022   Procedure: CYSTOSCOPY;  Surgeon: Pinal Ronal RAMAN, MD;  Location: [facility];  Service: Gynecology;  Laterality: N/A;   ESOPHAGOGASTRODUODENOSCOPY  01/08/2022   2cm hiatal hernia, esophagus dilated   TOTAL LAPAROSCOPIC HYSTERECTOMY WITH SALPINGECTOMY Bilateral 04/23/2022   Procedure: TOTAL LAPAROSCOPIC HYSTERECTOMY BILATERAL SALPINGECTOMY, LEFT OOPHORECTOMY;  Surgeon: Pinal Ronal RAMAN, MD;  Location: [facility] [place];  Service: Gynecology;  Laterality: Bilateral;   TRANSTHORACIC ECHOCARDIOGRAM  11/28/2021   see results in  Epic   TRANSTHORACIC ECHOCARDIOGRAM  07/05/2020   in Epic   TUBAL LIGATION  02/2012   WISDOM TOOTH EXTRACTION     early 1990's   Patient Active Problem List   Diagnosis Date Noted   Influenza A 06/25/2023   High risk medication use 05/06/2023   Genetic testing 07/04/2022   Family history of uterine cancer 06/24/2022   Local edema 02/25/2022   Hypothyroidism due to Hashimoto's thyroiditis 06/07/2021   Malignant neoplasm of lower-inner quadrant of left breast in female, estrogen receptor positive (HCC) 10/23/2020   Optic neuritis, left 10/18/2020    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 She says that she has a problem with passing out with medical things but can let us  know if she feels this might happen.   PRECAUTIONS: left UE Lymphedema risk, None  SUBJECTIVE: Pt returns for 6 month L-Dex screen. I started noticing some increased swelling last week when my doctor had me increase my diuretic last week. I bandaged over the weekend.  PAIN:  Are you having pain? No  SOZO SCREENING: Patient was assessed today using the SOZO machine to determine the lymphedema index score. This was compared to her baseline score. It was determined that she is within the recommended range when compared to her baseline and no further action is needed at this time. She will continue SOZO screenings. These are done every 3 months for 2 years post operatively followed by every 6 months for 2 years, and then annually.   L-DEX FLOWSHEETS - 10/26/23 0900       L-DEX LYMPHEDEMA SCREENING   Measurement Type Unilateral    L-DEX MEASUREMENT EXTREMITY Upper Extremity    POSITION  Standing    DOMINANT SIDE Right    At Risk Side Left    BASELINE SCORE (UNILATERAL) 2.5    L-DEX SCORE (UNILATERAL) 1.6    VALUE CHANGE (UNILAT) -0.9          P: Pt will return in 1 month for reassess due to new swelling.   Aden Berwyn Caldron, PTA 10/26/2023, 9:24 AM

## 2023-10-29 ENCOUNTER — Other Ambulatory Visit (HOSPITAL_BASED_OUTPATIENT_CLINIC_OR_DEPARTMENT_OTHER): Payer: Self-pay

## 2023-10-29 ENCOUNTER — Ambulatory Visit (HOSPITAL_BASED_OUTPATIENT_CLINIC_OR_DEPARTMENT_OTHER): Payer: Commercial Managed Care - PPO | Admitting: Obstetrics & Gynecology

## 2023-10-29 ENCOUNTER — Other Ambulatory Visit: Payer: Self-pay | Admitting: *Deleted

## 2023-10-29 MED ORDER — TORSEMIDE 10 MG PO TABS
10.0000 mg | ORAL_TABLET | Freq: Every day | ORAL | 0 refills | Status: DC
  Filled 2023-10-29: qty 30, 30d supply, fill #0

## 2023-10-30 ENCOUNTER — Other Ambulatory Visit (HOSPITAL_BASED_OUTPATIENT_CLINIC_OR_DEPARTMENT_OTHER): Payer: Self-pay | Admitting: *Deleted

## 2023-10-30 ENCOUNTER — Encounter (HOSPITAL_BASED_OUTPATIENT_CLINIC_OR_DEPARTMENT_OTHER): Payer: Self-pay

## 2023-10-30 DIAGNOSIS — R0602 Shortness of breath: Secondary | ICD-10-CM

## 2023-10-30 DIAGNOSIS — R42 Dizziness and giddiness: Secondary | ICD-10-CM

## 2023-10-30 DIAGNOSIS — I340 Nonrheumatic mitral (valve) insufficiency: Secondary | ICD-10-CM

## 2023-10-30 NOTE — Telephone Encounter (Signed)
 Valerie [name] has been scheduled for 12-21-23 at 3:30 pm with Dr. Soldatova-ENT

## 2023-11-03 ENCOUNTER — Encounter: Payer: Self-pay | Admitting: Neurology

## 2023-11-03 DIAGNOSIS — G3781 Myelin oligodendrocyte glycoprotein antibody disease: Secondary | ICD-10-CM

## 2023-11-03 MED ORDER — ACTEMRA 162 MG/0.9ML ~~LOC~~ SOSY
0.9000 mL | PREFILLED_SYRINGE | SUBCUTANEOUS | 0 refills | Status: DC
Start: 2023-11-03 — End: 2023-11-10

## 2023-11-03 MED ORDER — ACTEMRA 162 MG/0.9ML ~~LOC~~ SOSY: 0.9000 mL | PREFILLED_SYRINGE | SUBCUTANEOUS | 0 refills | Status: DC

## 2023-11-04 ENCOUNTER — Encounter: Payer: Self-pay | Admitting: Allergy

## 2023-11-04 ENCOUNTER — Other Ambulatory Visit: Payer: Self-pay | Admitting: *Deleted

## 2023-11-04 MED ORDER — RYALTRIS 665-25 MCG/ACT NA SUSP: NASAL | 3 refills | Status: AC

## 2023-11-05 ENCOUNTER — Other Ambulatory Visit (HOSPITAL_BASED_OUTPATIENT_CLINIC_OR_DEPARTMENT_OTHER): Payer: Self-pay

## 2023-11-05 NOTE — Telephone Encounter (Signed)
 Contacted patient and she advised that the dizzy spells that she experiences have been occurring at rest. Pt admits that she has been staying hydrated with taking in 90 to 100 oz of water a day for the last week. Pt reports that she is not taking lasix  and she had stopped the torsemide . ECHO from 2022 showed mild mitral regurgitation and enlarged LA. Pt has a family history with her father having valve regurgitation. Do you want to order echo to compare?

## 2023-11-05 NOTE — Progress Notes (Signed)
 Pt advised that she is not taking Lasix .

## 2023-11-05 NOTE — Telephone Encounter (Signed)
 She is not presently taking any blood pressure lowering agents.   Often dizziness with position changes can be improved by staying well hydrated, making position changes slowly, wearing knee high compression wraps.   Typically postural hypotension is treated with those lifestyle changes.   Given persistent shortness of breath, dizziness reasonable to proceed with echocardiogram and schedule follow up visit with Dr. Bud or APP at [facility] or [facility] as she was last seen nearly 2 years ago in clinic.  Ermine Spofford S Torria Fromer, NP

## 2023-11-06 NOTE — Telephone Encounter (Signed)
 If there is anyway we can get echo before her 11/27/23 visit with Dr. Lonni that would be great. Not sure if there is a cancellation list? If not, okay to leave OV and echo as scheduled.   Jahniyah Revere S Zacory Fiola, NP

## 2023-11-09 ENCOUNTER — Encounter: Payer: Self-pay | Admitting: Family Medicine

## 2023-11-10 ENCOUNTER — Telehealth: Payer: Self-pay | Admitting: Neurology

## 2023-11-10 DIAGNOSIS — G3781 Myelin oligodendrocyte glycoprotein antibody disease: Secondary | ICD-10-CM

## 2023-11-10 MED ORDER — ACTEMRA ACTPEN 162 MG/0.9ML ~~LOC~~ SOAJ: 0.9000 mL | SUBCUTANEOUS | 1 refills | Status: DC

## 2023-11-10 NOTE — Telephone Encounter (Signed)
 Called [facility] at [phone] and spoke w/ Rosina. Provided VO for pt to receive Actpen instead of prefilled syringes qty 10.35ml/84 days supply, 1 refill. They will get this ready for pt.

## 2023-11-10 NOTE — Telephone Encounter (Signed)
 Pharmacy  is requesting a refill for ACTEMRA  162 MG/0.9ML SOSY request for ACT Pen Form (Per Ginnie at [phone]).  Pharmacy: [facility] For pt

## 2023-11-11 ENCOUNTER — Other Ambulatory Visit: Payer: Self-pay | Admitting: Family Medicine

## 2023-11-11 DIAGNOSIS — Z79899 Other long term (current) drug therapy: Secondary | ICD-10-CM

## 2023-11-11 DIAGNOSIS — E063 Autoimmune thyroiditis: Secondary | ICD-10-CM

## 2023-11-11 DIAGNOSIS — R600 Localized edema: Secondary | ICD-10-CM

## 2023-11-11 DIAGNOSIS — R0602 Shortness of breath: Secondary | ICD-10-CM

## 2023-11-12 ENCOUNTER — Other Ambulatory Visit (INDEPENDENT_AMBULATORY_CARE_PROVIDER_SITE_OTHER)

## 2023-11-12 DIAGNOSIS — R0602 Shortness of breath: Secondary | ICD-10-CM | POA: Diagnosis not present

## 2023-11-12 DIAGNOSIS — R600 Localized edema: Secondary | ICD-10-CM | POA: Diagnosis not present

## 2023-11-12 DIAGNOSIS — Z79899 Other long term (current) drug therapy: Secondary | ICD-10-CM | POA: Diagnosis not present

## 2023-11-12 DIAGNOSIS — E063 Autoimmune thyroiditis: Secondary | ICD-10-CM

## 2023-11-12 LAB — CBC WITH DIFFERENTIAL/PLATELET
Basophils Absolute: 0 K/uL (ref 0.0–0.1)
Basophils Relative: 0.3 % (ref 0.0–3.0)
Eosinophils Absolute: 0.3 K/uL (ref 0.0–0.7)
Eosinophils Relative: 8.2 % — ABNORMAL HIGH (ref 0.0–5.0)
HCT: 44.2 % (ref 36.0–46.0)
Hemoglobin: 14.7 g/dL (ref 12.0–15.0)
Lymphocytes Relative: 29.4 % (ref 12.0–46.0)
Lymphs Abs: 1 K/uL (ref 0.7–4.0)
MCHC: 33.2 g/dL (ref 30.0–36.0)
MCV: 96.7 fl (ref 78.0–100.0)
Monocytes Absolute: 0.4 K/uL (ref 0.1–1.0)
Monocytes Relative: 12.9 % — ABNORMAL HIGH (ref 3.0–12.0)
Neutro Abs: 1.6 K/uL (ref 1.4–7.7)
Neutrophils Relative %: 49.2 % (ref 43.0–77.0)
Platelets: 209 K/uL (ref 150.0–400.0)
RBC: 4.57 Mil/uL (ref 3.87–5.11)
RDW: 12.8 % (ref 11.5–15.5)
WBC: 3.3 K/uL — ABNORMAL LOW (ref 4.0–10.5)

## 2023-11-12 LAB — COMPREHENSIVE METABOLIC PANEL WITH GFR
ALT: 29 U/L (ref 0–35)
AST: 22 U/L (ref 0–37)
Albumin: 4 g/dL (ref 3.5–5.2)
Alkaline Phosphatase: 33 U/L — ABNORMAL LOW (ref 39–117)
BUN: 18 mg/dL (ref 6–23)
CO2: 29 meq/L (ref 19–32)
Calcium: 8.6 mg/dL (ref 8.4–10.5)
Chloride: 105 meq/L (ref 96–112)
Creatinine, Ser: 0.99 mg/dL (ref 0.40–1.20)
GFR: 64.83 mL/min (ref >60.00)
Glucose, Bld: 96 mg/dL (ref 70–99)
Potassium: 4.5 meq/L (ref 3.5–5.1)
Sodium: 138 meq/L (ref 135–145)
Total Bilirubin: 0.5 mg/dL (ref 0.2–1.2)
Total Protein: 6.7 g/dL (ref 6.0–8.3)

## 2023-11-12 LAB — URINALYSIS, ROUTINE W REFLEX MICROSCOPIC
Bilirubin Urine: NEGATIVE
Hgb urine dipstick: NEGATIVE
Ketones, ur: NEGATIVE
Leukocytes,Ua: NEGATIVE
Nitrite: NEGATIVE
RBC / HPF: NONE SEEN (ref 0–?)
Specific Gravity, Urine: 1.01 (ref 1.000–1.030)
Total Protein, Urine: NEGATIVE
Urine Glucose: NEGATIVE
Urobilinogen, UA: 0.2 (ref 0.0–1.0)
WBC, UA: NONE SEEN (ref 0–?)
pH: 7 (ref 5.0–8.0)

## 2023-11-12 LAB — CORTISOL: Cortisol, Plasma: 5.2 ug/dL

## 2023-11-12 LAB — TSH: TSH: 2.46 u[IU]/mL (ref 0.35–5.50)

## 2023-11-12 LAB — BRAIN NATRIURETIC PEPTIDE: Pro B Natriuretic peptide (BNP): 16 pg/mL (ref 0.0–100.0)

## 2023-11-12 LAB — CK: Total CK: 67 U/L (ref 17–177)

## 2023-11-12 LAB — MAGNESIUM: Magnesium: 2.2 mg/dL (ref 1.5–2.5)

## 2023-11-14 ENCOUNTER — Other Ambulatory Visit: Payer: Self-pay | Admitting: Medical Genetics

## 2023-11-14 DIAGNOSIS — Z006 Encounter for examination for normal comparison and control in clinical research program: Secondary | ICD-10-CM

## 2023-11-16 ENCOUNTER — Encounter: Payer: Self-pay | Admitting: Family Medicine

## 2023-11-16 ENCOUNTER — Ambulatory Visit: Payer: Self-pay | Admitting: Family Medicine

## 2023-11-16 ENCOUNTER — Ambulatory Visit: Admitting: Family Medicine

## 2023-11-16 ENCOUNTER — Ambulatory Visit (INDEPENDENT_AMBULATORY_CARE_PROVIDER_SITE_OTHER)

## 2023-11-16 VITALS — BP 103/70 | HR 77 | Temp 97.5°F | Resp 18 | Ht 65.0 in | Wt 193.0 lb

## 2023-11-16 DIAGNOSIS — R61 Generalized hyperhidrosis: Secondary | ICD-10-CM

## 2023-11-16 DIAGNOSIS — R600 Localized edema: Secondary | ICD-10-CM

## 2023-11-16 DIAGNOSIS — R635 Abnormal weight gain: Secondary | ICD-10-CM

## 2023-11-16 DIAGNOSIS — D729 Disorder of white blood cells, unspecified: Secondary | ICD-10-CM

## 2023-11-16 DIAGNOSIS — R0602 Shortness of breath: Secondary | ICD-10-CM

## 2023-11-16 NOTE — Progress Notes (Signed)
 Subjective:     Patient ID: Valerie [name], female    DOB: 07-28-1970, 53 y.o.   MRN: [id number]  Chief Complaint  Patient presents with   Medical Management of Chronic Issues    experiencing loss of muscle mass, faituge, and dizziness, discuss need for possible blood work    HPI Discussed the use of AI scribe software for clinical note transcription with the patient, who gave verbal consent to proceed.  History of Present Illness Valerie [name] is a 53 year old female with lymphedema and thyroid  issues who presents with swelling and fluid retention.  She experiences significant swelling in her left arm due to lymphedema. The swelling worsened with daily Lasix  use, causing her arm to 'blow up'. She discontinued Lasix  and tried Torsemide  for three days without improvement. She manages her water intake, reducing from 90-100 ounces to 64 ounces daily, and monitors her urine color and frequency to gauge hydration. She wears compression stockings during the week to manage her symptoms.  She experiences a sensation of heaviness on her chest, which began this afternoon after lunch, described as 'something sitting on my chest'. Her breathing feels shallow, and she occasionally experiences dizziness, particularly after leaving the gym, along with persistent fatigue, falling asleep within half an hour of sitting down. No fever, chills, or food getting stuck when swallowing.  She has a chronic cough and frequent throat clearing, attributed to a sensation of 'gunk' in her throat. Allergy  medications and nasal spray have provided no significant relief. Previously on reflux medication at 40 mg twice daily, she now takes 40 mg every other day at night and 40 mg every morning due to insurance issues.  She reports excessive sweating, especially during minimal exertion at the gym, and notes a change from being 'always cold' to getting hot quickly over the past couple of years. She sometimes  wakes up sweating at night despite having air conditioning and a ceiling fan.  Her weight has increased from 167.4 pounds in July 2022 to 190 pounds currently, despite regular gym attendance and efforts to manage her diet. She is frustrated over her inability to lose weight and notes significant fluctuations in her body fat percentage.  She has been on thyroid  medication for 29 years and once attempted to stop it, resulting in her levels going 'sky high'. Her recent thyroid  levels are stable. She does not consume alcohol or caffeine and primarily drinks water.  She experiences occasional dizziness, sometimes unrelated to physical activity. Her blood sugar was 107 mg/dL a couple of hours after breakfast during a recent screening.  She has a history of low white blood cell count and slightly elevated eosinophils.    Health Maintenance Due  Topic Date Due   COVID-19 Vaccine (1) Never done   Pneumococcal Vaccine: 50+ Years (1 of 2 - PCV) Never done   Hepatitis B Vaccines (1 of 3 - 19+ 3-dose series) Never done   INFLUENZA VACCINE  11/13/2023    Past Medical History:  Diagnosis Date   Abnormal echocardiogram 11/28/2021   EF 60 -65%, abnormal global longitudinal strain, see cardiology OV in Epic dated 12/25/21   Allergy  4/25   See notes from Dr Luke   Breast cancer [facility])    Cancer [facility]) 10/2020   left breast, ER+, s/p radiation therapy 12/25/20 - 01/18/21, Patient follows with Dr. Mackey Chad @ [facility].   COVID-19 11/2019   flu-like symptoms, cough, no hospitalizations   Edema    lower extremity,  follows w/ [facility], Sherrilee Dinning, [place]. See OV dated 03/24/22 in Epic.   GERD (gastroesophageal reflux disease)    Follows w/ Dr. Rosario Guppy, gastroenterologist and PCP, Dr. Jenkins Jansky [facility] @ [facility].   Hashimoto's disease    Follows with Dr. Tawni Fendt @ [place].   History of hiatal hernia    2 cm hiatal hernia per 01/08/22 EGD   History of radiation therapy  2022   completed 01-18-2021   [facility] (hard of hearing)    has gotten bilateral aides.   Hypothyroidism    Follows w/ endocronologist, Dr. Tawni Fendt @ [place].   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 Charlie Crete @ [facility].   Optic neuritis    Follows w/ opthamology, Dr. 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), 04/2020 left eye   Personal history of radiation therapy    PONV (postoperative nausea and vomiting)    Status post dilation of esophageal narrowing 01/08/2022   EGD, esophagus dilated   Wears glasses     Past Surgical History:  Procedure Laterality Date   ABDOMINAL HYSTERECTOMY  04/23/22   BIOPSY BREAST Left 10/17/2020   BREAST LUMPECTOMY     BREAST LUMPECTOMY WITH RADIOACTIVE SEED AND SENTINEL LYMPH NODE BIOPSY Left 11/15/2020   Procedure: LEFT BREAST LUMPECTOMY WITH RADIOACTIVE SEED AND LEFT SENTINEL LYMPH NODE BIOPSY;  Surgeon: Vanderbilt Ned, MD;  Location: [facility];  Service: General;  Laterality: Left;   BREAST SURGERY  August 2022   Cancer surgery   COLONOSCOPY  09/06/2021   cecal polyp   CT CORONARY CA SCORING  07/13/2020   Coronary Calcium = 0 (in Epic)   CYSTOSCOPY N/A 04/23/2022   Procedure: CYSTOSCOPY;  Surgeon: Pinal Ronal RAMAN, MD;  Location: [facility];  Service: Gynecology;  Laterality: N/A;   ESOPHAGOGASTRODUODENOSCOPY  01/08/2022   2cm hiatal hernia, esophagus dilated   TOTAL LAPAROSCOPIC HYSTERECTOMY WITH SALPINGECTOMY Bilateral 04/23/2022   Procedure: TOTAL LAPAROSCOPIC HYSTERECTOMY BILATERAL SALPINGECTOMY, LEFT OOPHORECTOMY;  Surgeon: Pinal Ronal RAMAN, MD;  Location: [facility] [place];  Service: Gynecology;  Laterality: Bilateral;   TRANSTHORACIC ECHOCARDIOGRAM  11/28/2021   see results in Epic   TRANSTHORACIC ECHOCARDIOGRAM   07/05/2020   in Epic   TUBAL LIGATION  02/2012   WISDOM TOOTH EXTRACTION     early 1990's     Current Outpatient Medications:    ACTEMRA  ACTPEN 162 MG/0.9ML SOAJ, Inject 0.9 mLs into the skin once a week., Disp: 10.8 mL, Rfl: 1   ascorbic acid  (VITAMIN C) 1000 MG tablet, Take 1 tablet by mouth daily., Disp: , Rfl:    cholecalciferol  (VITAMIN D3) 25 MCG (1000 UNIT) tablet, Take 5,000 Units by mouth daily., Disp: , Rfl:    levocetirizine (XYZAL ) 5 MG tablet, Take 1 tablet (5 mg total) by mouth every evening., Disp: 90 tablet, Rfl: 0   levothyroxine  (SYNTHROID ) 88 MCG tablet, TAKE 1 TABLET BY MOUTH 6 DAYS  PER WEEK, Disp: 78 tablet, Rfl: 3   MAGNESIUM GLUCONATE PO, Take 2 tablets by mouth daily at 12 noon., Disp: , Rfl:    Olopatadine-Mometasone (RYALTRIS ) 665-25 MCG/ACT SUSP, 1-2 sprays per nostril twice a day, Disp: 87 g, Rfl: 3   omeprazole  (PRILOSEC) 40 MG capsule, TAKE 1 CAPSULE (40 MG TOTAL) BY MOUTH  IN THE MORNING AND AT BEDTIME. (NOT COVERED), Disp: 180 capsule, Rfl: 2   Probiotic Product (CULTRELLE KIDS IMMUNE DEFENSE PO), Take 1 tablet by mouth daily., Disp: , Rfl:    torsemide  (DEMADEX ) 10 MG tablet, Take 1 tablet (10 mg total) by mouth daily., Disp: 30 tablet, Rfl: 0   UNABLE TO FIND, Take 4 tablets by mouth daily. Med Name: Omega Pure 2 tablet in AM and 1 tablet in PM, Disp: , Rfl:    UNABLE TO FIND, Take by mouth daily. Med Name: Ultra Inflamix 2 scoops daily, Disp: , Rfl:    UNABLE TO FIND, Take 2 tablets by mouth daily. Med Name: R-Lipoic Acid, Disp: , Rfl:   No Known Allergies ROS neg/noncontributory except as noted HPI/below      Objective:     BP 103/70   Pulse 77   Temp (!) 97.5 F (36.4 C) (Temporal)   Resp 18   Ht 5' 5 (1.651 m)   Wt 193 lb (87.5 kg)   LMP 12/23/2021 (Exact Date)   SpO2 94%   BMI 32.12 kg/m  Wt Readings from Last 3 Encounters:  11/16/23 193 lb (87.5 kg)  10/26/23 192 lb 8 oz (87.3 kg)  09/22/23 195 lb 12 oz (88.8 kg)    Physical  Exam   Gen: WDWN NAD HEENT: NCAT, conjunctiva not injected, sclera nonicteric OP normal.  Clears throat freq NECK:  supple, no thyromegaly, no nodes, no carotid bruits CARDIAC: RRR, S1S2+, no murmur.  LUNGS: CTAB. No wheezes ABDOMEN:  BS+, soft, NTND, No HSM, no masses EXT:  no edema MSK: no gross abnormalities.  NEURO: A&O x3.  CN II-XII intact.  PSYCH: normal mood. Good eye contact     Assessment & Plan:  SOB (shortness of breath) -     DG Chest 2 View; Future  Abnormal WBC count -     CBC with Differential/Platelet; Future  Local edema  Hyperhidrosis  Weight gain  Assessment and Plan Assessment & Plan Lymphedema, left arm   Exacerbation of left arm lymphedema with Lasix  use. Torsemide  is ineffective. This chronic condition presents ongoing management challenges.  Chronic cough and throat clearing   Persistent cough and throat clearing for months. Differential includes reflux, allergies, or ENT issues. No significant improvement with nasal spray and allergy  medications. History of esophageal dilation for food impaction. Continue nasal spray and allergy  medications. Follow up with ENT in September. May need to see GI  Obesity with persistent weight gain and fluid retention   Persistent weight gain and fluid retention despite diuretics and lifestyle changes. No improvement with Torsemide  or Lasix . Possible overhydration considered. Monitor fluid intake and continue exercise regimen.  Fatigue and excessive daytime sleepiness   Reports of fatigue and falling asleep within half an hour of sitting. Possible multifactorial causes including sleep disturbances, medication side effects, or underlying medical conditions. Labs unremarkable x wbc sl low  Hyperhidrosis and menopausal symptoms (history of breast cancer, hormone therapy contraindicated)   Increased sweating and menopausal symptoms. Hormone therapy contraindicated due to breast cancer history. Current management with  menopause relief tincture of uncertain efficacy.  Shortness of breath and chest heaviness   Recent onset of chest heaviness and shortness of breath. Possible fluid overload considered. Order chest x-ray and review results when available. Proceed with echocardiogram on the 13th and card f/u.  Dizziness   Intermittent dizziness, sometimes post-exercise. Possible hypoglycemia considered. Blood sugar levels appear normal. Provide continuous glucose monitor for 15 days to assess blood  sugar levels.  Hypothyroidism, stable on therapy   Long-standing hypothyroidism, well-managed on medication. Recent thyroid  levels within acceptable range.  History of breast cancer   Breast cancer history limits treatment options for menopausal symptoms.  Allergic rhinitis   Managed with nasal spray and allergy  medications. No significant improvement noted. Continue nasal spray and allergy  medications.  Gastroesophageal reflux disease (GERD)   GERD managed with 40 mg of medication in the morning and every other night. Insurance issues affecting medication adherence. Ensure medication adherence and consider discussing medication regimen with insurance for better coverage.  Leukopenia (low white blood cell count), monitoring   Recent lab results show low white blood cell count. Possible viral etiology considered. Repeat white blood cell count in a few weeks.    Return if symptoms worsen or fail to improve.  Jenkins CHRISTELLA Carrel, MD

## 2023-11-16 NOTE — Patient Instructions (Signed)
 Wegovy and zepbound-check if covered.

## 2023-11-16 NOTE — Progress Notes (Signed)
D/w pt at appt.

## 2023-11-17 ENCOUNTER — Encounter: Payer: Self-pay | Admitting: Rehabilitation

## 2023-11-19 ENCOUNTER — Encounter: Payer: Self-pay | Admitting: Internal Medicine

## 2023-11-19 ENCOUNTER — Ambulatory Visit: Payer: Self-pay | Admitting: Family Medicine

## 2023-11-19 DIAGNOSIS — R053 Chronic cough: Secondary | ICD-10-CM

## 2023-11-19 DIAGNOSIS — R0602 Shortness of breath: Secondary | ICD-10-CM

## 2023-11-19 NOTE — Progress Notes (Signed)
 Spoke to pt.  Having echo next wk Has known HH but told small in past Still coughing, etc.  Will order CT chest

## 2023-11-24 ENCOUNTER — Encounter: Payer: Self-pay | Admitting: Internal Medicine

## 2023-11-25 ENCOUNTER — Ambulatory Visit (HOSPITAL_BASED_OUTPATIENT_CLINIC_OR_DEPARTMENT_OTHER)

## 2023-11-25 ENCOUNTER — Ambulatory Visit
Admission: RE | Admit: 2023-11-25 | Discharge: 2023-11-25 | Disposition: A | Discharge status: Home / Self Care | Source: Ambulatory Visit | Attending: Family Medicine | Admitting: Family Medicine

## 2023-11-25 DIAGNOSIS — I340 Nonrheumatic mitral (valve) insufficiency: Secondary | ICD-10-CM | POA: Diagnosis not present

## 2023-11-25 DIAGNOSIS — R42 Dizziness and giddiness: Secondary | ICD-10-CM

## 2023-11-25 DIAGNOSIS — R0602 Shortness of breath: Secondary | ICD-10-CM

## 2023-11-25 DIAGNOSIS — R053 Chronic cough: Secondary | ICD-10-CM

## 2023-11-25 LAB — ECHOCARDIOGRAM COMPLETE
Area-P 1/2: 4.31 cm2
Radius: 0.4 cm
S' Lateral: 1.95 cm

## 2023-11-26 ENCOUNTER — Encounter (INDEPENDENT_AMBULATORY_CARE_PROVIDER_SITE_OTHER): Payer: Self-pay | Admitting: Family Medicine

## 2023-11-26 ENCOUNTER — Ambulatory Visit (HOSPITAL_BASED_OUTPATIENT_CLINIC_OR_DEPARTMENT_OTHER): Payer: Self-pay | Admitting: Family

## 2023-11-26 ENCOUNTER — Encounter (INDEPENDENT_AMBULATORY_CARE_PROVIDER_SITE_OTHER): Payer: Self-pay | Admitting: *Deleted

## 2023-11-26 ENCOUNTER — Ambulatory Visit (INDEPENDENT_AMBULATORY_CARE_PROVIDER_SITE_OTHER): Admitting: Family Medicine

## 2023-11-26 VITALS — BP 105/72 | HR 82 | Temp 98.1°F | Ht 65.0 in | Wt 189.0 lb

## 2023-11-26 DIAGNOSIS — E063 Autoimmune thyroiditis: Secondary | ICD-10-CM | POA: Diagnosis not present

## 2023-11-26 DIAGNOSIS — Z6831 Body mass index (BMI) 31.0-31.9, adult: Secondary | ICD-10-CM

## 2023-11-26 DIAGNOSIS — Z170 Estrogen receptor positive status [ER+]: Secondary | ICD-10-CM

## 2023-11-26 DIAGNOSIS — E669 Obesity, unspecified: Secondary | ICD-10-CM

## 2023-11-26 DIAGNOSIS — G3781 Myelin oligodendrocyte glycoprotein antibody disease: Secondary | ICD-10-CM

## 2023-11-26 DIAGNOSIS — C50312 Malignant neoplasm of lower-inner quadrant of left female breast: Secondary | ICD-10-CM | POA: Diagnosis not present

## 2023-11-26 DIAGNOSIS — Z0289 Encounter for other administrative examinations: Secondary | ICD-10-CM

## 2023-11-26 NOTE — Progress Notes (Signed)
 Valerie DOROTHA Jenkins, DO, ABFM, ABOM Bariatric physician [address] [place], [place], [place] [address] Office: [phone]  /  Fax: [phone]     Initial Evaluation:  Valerie [name] was seen in clinic today to evaluate for obesity. She is interested in losing weight to improve overall health and reduce the risk of weight related complications. She presents today to review program treatment options, initial physical assessment, and evaluation.      She was referred by: Self-Referral -- Found on Google   When asked what they hope to accomplish? She states: improve existing medical conditions, reduce number of medications, improve quality of life, improve appearence  When asked how has your weight affected you? She states: Has affected self-esteem, contributed to medical problems, contributed to orthopedic problems or mobility issues (knees, ankles, hips), and having fatigue.  Contributing factors to her weight change: family history of obesity and moderate to high levels of stress  Some associated conditions: Hyperlipidemia and GERD, and Breast cancer.   Current nutrition plan: None currently, but has tried various diets in the past.   Current level of physical activity:  On M/W/F she is weight training for 25-30 minutes and cardio for 20-25 minutes. Additionally, she is swimming laps at the [facility] pool for 40-45 mintutes 2 days/week.   Current or previous pharmacotherapy: None  Response to medication: Never tried medications   Works at a warehouse FT at [employer] - requires plenty pushing, pulling, and walking.    Past Medical History:  Diagnosis Date   Abnormal echocardiogram 11/28/2021   EF 60 -65%, abnormal global longitudinal strain, see cardiology OV in Epic dated 12/25/21   Allergy  4/25   See notes from Dr Valerie [name]   Breast cancer [facility])    Cancer [facility]) 10/2020   left breast, ER+, s/p radiation therapy 12/25/20 - 01/18/21, Patient follows with Dr. Mackey [name] @  [facility].   COVID-19 11/2019   flu-like symptoms, cough, no hospitalizations   Edema    lower extremity, follows w/ [facility], Valerie [name], [place]. See OV dated 03/24/22 in Epic.   GERD (gastroesophageal reflux disease)    Follows w/ Dr. Rosario [name], gastroenterologist and PCP, Dr. Jenkins [name] [facility].   Hashimoto's disease    Follows with Dr. Tawni [name] @ [place].   History of hiatal hernia    2 cm hiatal hernia per 01/08/22 EGD   History of radiation therapy 2022   completed 01-18-2021   [facility] (hard of hearing)    has gotten bilateral aides.   Hypothyroidism    Follows w/ endocronologist, Dr. Tawni [name] @ [place].   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 Charlie [name] @ [facility].   Optic neuritis    Follows w/ opthamology, Dr. Inocente [name].03/2020 right eye (almost complete blindness), 04/2020 left eye   Personal history of radiation therapy    PONV (postoperative nausea and vomiting)    Status post dilation of esophageal narrowing 01/08/2022   EGD, esophagus dilated   Wears glasses     Current Outpatient Medications  Medication Instructions   ACTEMRA  ACTPEN 162 MG/0.9ML SOAJ 0.9 mLs, Subcutaneous, Weekly   ascorbic acid  (VITAMIN C) 1000 MG tablet 1 tablet, Daily   cholecalciferol  (VITAMIN D3) 5,000 Units, Daily   levocetirizine (XYZAL ) 5 mg, Oral, Every  evening   levothyroxine  (SYNTHROID ) 88 MCG tablet TAKE 1 TABLET BY MOUTH 6 DAYS  PER WEEK   MAGNESIUM GLUCONATE PO 2 tablets, Daily   Olopatadine-Mometasone (RYALTRIS ) 665-25 MCG/ACT SUSP 1-2 sprays per nostril twice a day   omeprazole  (PRILOSEC) 40 MG capsule TAKE 1 CAPSULE (40 MG TOTAL) BY MOUTH IN THE MORNING AND AT BEDTIME. (NOT COVERED)   Probiotic Product (CULTRELLE KIDS IMMUNE DEFENSE PO) 1 tablet, Daily    torsemide  (DEMADEX ) 10 mg, Oral, Daily   UNABLE TO FIND 4 tablets, Daily   UNABLE TO FIND Daily   UNABLE TO FIND 2 tablets, Daily     No Known Allergies   Past Surgical History:  Procedure Laterality Date   ABDOMINAL HYSTERECTOMY  04/23/22   BIOPSY BREAST Left 10/17/2020   BREAST LUMPECTOMY     BREAST LUMPECTOMY WITH RADIOACTIVE SEED AND SENTINEL LYMPH NODE BIOPSY Left 11/15/2020   Procedure: LEFT BREAST LUMPECTOMY WITH RADIOACTIVE SEED AND LEFT SENTINEL LYMPH NODE BIOPSY;  Surgeon: Valerie [name] Ned, MD;  Location: [facility];  Service: General;  Laterality: Left;   BREAST SURGERY  August 2022   Cancer surgery   COLONOSCOPY  09/06/2021   cecal polyp   CT CORONARY CA SCORING  07/13/2020   Coronary Calcium = 0 (in Epic)   CYSTOSCOPY N/A 04/23/2022   Procedure: CYSTOSCOPY;  Surgeon: Valerie Ronal RAMAN, MD;  Location: [facility];  Service: Gynecology;  Laterality: N/A;   ESOPHAGOGASTRODUODENOSCOPY  01/08/2022   2cm hiatal hernia, esophagus dilated   TOTAL LAPAROSCOPIC HYSTERECTOMY WITH SALPINGECTOMY Bilateral 04/23/2022   Procedure: TOTAL LAPAROSCOPIC HYSTERECTOMY BILATERAL SALPINGECTOMY, LEFT OOPHORECTOMY;  Surgeon: Valerie Ronal RAMAN, MD;  Location: [facility];  Service: Gynecology;  Laterality: Bilateral;   TRANSTHORACIC ECHOCARDIOGRAM  11/28/2021   see results in Epic   TRANSTHORACIC ECHOCARDIOGRAM  07/05/2020   in Epic   TUBAL LIGATION  02/2012   WISDOM TOOTH EXTRACTION     early 1990's     Family History  Problem Relation Age of Onset   Hearing loss Mother    Thyroid  disease Mother    Cancer Mother 57       endometrial cancer, lynch negative   GER disease Mother    Other Mother        uterine fibroids, optic disc abnormality    Eczema Mother    Colon polyps Mother    Hypothyroidism Mother    Osteoarthritis Mother    Hypertension Mother    Migraines Mother    Mitral valve prolapse Father        regurgitation/prolase   Colon  polyps Father    Hearing loss Father    Other Father        Colon adenoma   Hyperlipidemia Father    GER disease Father    Heart murmur Father    Amblyopia Father        Right eye   Neuropathy Father    Aortic aneurysm Father    Heart murmur Brother    Glaucoma Maternal Grandmother    Macular degeneration Maternal Grandmother    Arthritis Maternal Grandmother    Fainting Maternal Grandmother    Arthritis Maternal Grandfather    Multiple sclerosis Paternal Grandmother    Heart murmur Paternal Grandfather    Heart disease Paternal Grandfather    Diabetes Maternal Aunt    Heart murmur Paternal Aunt    Breast cancer Neg Hx    Colon cancer Neg Hx    Esophageal cancer  Neg Hx    Rectal cancer Neg Hx    Stomach cancer Neg Hx      Objective:  BP 105/72   Pulse 82   Temp 98.1 F (36.7 C)   Ht 5' 5 (1.651 m)   Wt 189 lb (85.7 kg)   LMP 12/23/2021 (Exact Date)   SpO2 93%   BMI 31.45 kg/m  She was weighed on the bioimpedance scale: Body mass index is 31.45 kg/m.  Visceral Fat rating : 10 , Body Fat %:  40.4   Vitals Temp: 98.1 F (36.7 C) BP: 105/72 Pulse Rate: 82 SpO2: 93 %   Anthropometric Measurements Height: 5' 5 (1.651 m) Weight: 189 lb (85.7 kg) BMI (Calculated): 31.45 Weight at Last Visit: NA Weight Lost Since Last Visit: NA Weight Gained Since Last Visit: NA Starting Weight: NA Total Weight Loss (lbs): -- (NA) Peak Weight: 195lb Waist Measurement : -- (NA)   Body Composition  Body Fat %: 40.4 % Fat Mass (lbs): 76.4 lbs Muscle Mass (lbs): 107 lbs Total Body Water (lbs): 74.4 lbs Visceral Fat Rating : 10   Other Clinical Data A1c: -- (NA) RMR: -- (NA) Fasting: No Labs: No Today's Visit #: Consult Starting Date: -- (NA) Comments: Consult     General: Well Developed, well nourished, and in no acute distress.  HEENT: Normocephalic, atraumatic; EOMI, sclerae are anicteric. Skin: Warm and dry, good turgor Chest:  Normal excursion, shape, no  gross ABN Respiratory: No conversational dyspnea; speaking in full sentences NeuroM-Sk:  Normal gross ROM * 4 extremities  Psych: A and O *3, insight adequate, mood- full    Assessment and Plan:   FOR THE DISEASE OF OBESITY:  Obesity, unspecified class, unspecified obesity type, unspecified whether serious comorbidity present BMI 31.0-31.9,adult Assessment & Plan: We reviewed anthropometrics, biometrics, associated medical conditions and contributing factors with patient. Valerie [name] would benefit from a medically tailored reduced calorie nutrional plan based on their REE (resting energy expenditure), which will be determined by indirect calorimetry.  We will also assess for cardiometabolic risk and nutritional derangements via fasting labs at intake appointment.    Obesity Treatment / Action Plan:   she was weighed on the bioimpedance scale and results were discussed and documented in the synopsis.   Valerie [name] will complete provided nutritional and psychosocial assessment questionnaire before the next appointment.  she will be scheduled for indirect calorimetry to determine resting energy expenditure in a fasting state.  This will allow us  to create a reduced calorie, high-protein meal plan to promote loss of fat mass while preserving muscle mass.  We will also assess for cardiometabolic risk and nutritional derangements via an ECG and fasting serologies at her next appointment.  she was encouraged to work on amassing support from family and friends to begin their weight loss journey.   Work on eliminating or reducing the presence of highly processed, poorly nutritious, calorie-dense foods in the home.   Obesity Education Performed Today:  Patient was counseled on nutritional approaches to weight loss and benefits of reducing processed foods and consuming plant-based foods and high quality protein as part of nutritional weight management program.   We discussed the  importance of long term lifestyle changes which include nutrition, exercise and behavioral modifications as well as the importance of customizing this to her specific health and social needs.   We discussed the benefits of reaching a healthier weight to alleviate the symptoms of existing conditions and reduce the risks of the biomechanical, metabolic  and psychological effects of obesity.  Was counseled on the health benefits of losing 5%-10% of total body weight.  Was counseled on our cognitive behavorial therapy program, lead by our bariatric psychologist, who focuses on emotional eating and creating positive behavorial change.  Was counseled on bariatric pharmacotherapy and how this may be used as an adjunct in their weight management    Valerie [name] to be in the action stage of change and states they are ready to start intensive lifestyle modifications and behavioral modifications.  It was recommended that she follow up in the next 1-2 weeks to review the above steps, and to continue with treatment of their chronic disease state of obesity   FOR OTHER CONDITIONS RELATED TO THE DISEASE OF OBESITY:  Malignant neoplasm of lower-inner quadrant of left breast in female, estrogen receptor positive (HCC) Assessment & Plan: Diagnosed with breast cancer in 2022. She shortly had a lumpectomy with no residual cancer. Last f/u with Dr. Odean of hematology & oncology was 05/14/23; breast exam was done at the time. Had radiation in 2022.   Continue to follow up with oncology care team as instructed. She would benefit from a high protein, low carb, and low calorie prescribed diet with more whole foods. Will continue monitoring alongside specialist/PCP if pt decides to join the program.     Hypothyroidism due to Hashimoto's thyroiditis Assessment & Plan: Currently taking Synthroid  88 mcg 6 days per week. Reports good compliance and tolerance. She is established with Dr. Trixie of endocrinology. She  reports exercising 5 days/week for about 8 months.   Continue with current med regimen. Follow up with Dr. Trixie as advised. Pt would benefit from low carb, low calorie, high protein nutritional diet. Will continue monitoring alongside specialist/PCP if pt decides to join the program.     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 Pt reports her symptom presentation was foggy vision and vision loss (vision went black). She described the foggy vision as being similar to a foggy mirror after a shower. Her condition was resolved around 06/2021 and has not had an episode since then. She is established and follows up with Dr. Vear at [facility].    Continue to follow up with her specialists as instructed by them. Will continue monitoring as it relates to her overall physical health and weight loss journey if pt decides to join the program.     Attestations:   I, Vernell Forest, acting as a medical scribe for Valerie Jenkins, DO., have compiled all relevant documentation for today's office visit on behalf of Valerie Jenkins, DO, while in the presence of [employer], DO.  I have spent 50 minutes in the care of the patient today.  40 minutes was spent in face to face counseling of the patient on the disease of obesity and what our program can do for their medical conditions as well as in preventing future diseases. I discussed the importance of comprehensive care in the treatment of obesity including mental well being and physical activity.   I have reviewed the above documentation for accuracy and completeness, and I agree with the above. Valerie JINNY [name], D.O.  The 21st Century Cures Act was signed into law in 2016 which includes the topic of electronic health records.  This provides immediate access to information in MyChart.  This includes consultation notes, operative notes, office  notes, lab results and pathology reports.  If you have  any questions about what you read please let us  know at your next visit so we can discuss your concerns and take corrective action if need be.  We are right here with you!

## 2023-11-27 ENCOUNTER — Other Ambulatory Visit (INDEPENDENT_AMBULATORY_CARE_PROVIDER_SITE_OTHER)

## 2023-11-27 ENCOUNTER — Ambulatory Visit (HOSPITAL_BASED_OUTPATIENT_CLINIC_OR_DEPARTMENT_OTHER): Admitting: Cardiology

## 2023-11-27 ENCOUNTER — Encounter (HOSPITAL_BASED_OUTPATIENT_CLINIC_OR_DEPARTMENT_OTHER): Payer: Self-pay | Admitting: Cardiology

## 2023-11-27 DIAGNOSIS — I340 Nonrheumatic mitral (valve) insufficiency: Secondary | ICD-10-CM | POA: Diagnosis not present

## 2023-11-27 DIAGNOSIS — R0609 Other forms of dyspnea: Secondary | ICD-10-CM | POA: Diagnosis not present

## 2023-11-27 DIAGNOSIS — D729 Disorder of white blood cells, unspecified: Secondary | ICD-10-CM | POA: Diagnosis not present

## 2023-11-27 DIAGNOSIS — Z712 Person consulting for explanation of examination or test findings: Secondary | ICD-10-CM

## 2023-11-27 DIAGNOSIS — R42 Dizziness and giddiness: Secondary | ICD-10-CM | POA: Diagnosis not present

## 2023-11-27 DIAGNOSIS — R600 Localized edema: Secondary | ICD-10-CM

## 2023-11-27 LAB — CBC WITH DIFFERENTIAL/PLATELET
Basophils Absolute: 0 K/uL (ref 0.0–0.1)
Basophils Relative: 0.5 % (ref 0.0–3.0)
Eosinophils Absolute: 0.3 K/uL (ref 0.0–0.7)
Eosinophils Relative: 6.1 % — ABNORMAL HIGH (ref 0.0–5.0)
HCT: 45.8 % (ref 36.0–46.0)
Hemoglobin: 15.3 g/dL — ABNORMAL HIGH (ref 12.0–15.0)
Lymphocytes Relative: 25 % (ref 12.0–46.0)
Lymphs Abs: 1.4 K/uL (ref 0.7–4.0)
MCHC: 33.3 g/dL (ref 30.0–36.0)
MCV: 96.2 fl (ref 78.0–100.0)
Monocytes Absolute: 0.7 K/uL (ref 0.1–1.0)
Monocytes Relative: 12.1 % — ABNORMAL HIGH (ref 3.0–12.0)
Neutro Abs: 3.2 K/uL (ref 1.4–7.7)
Neutrophils Relative %: 56.3 % (ref 43.0–77.0)
Platelets: 193 K/uL (ref 150.0–400.0)
RBC: 4.77 Mil/uL (ref 3.87–5.11)
RDW: 12.8 % (ref 11.5–15.5)
WBC: 5.7 K/uL (ref 4.0–10.5)

## 2023-11-27 LAB — GENECONNECT MOLECULAR SCREEN: Genetic Analysis Overall Interpretation: NEGATIVE

## 2023-11-27 NOTE — Patient Instructions (Signed)
 Medication Instructions:   Your physician recommends that you continue on your current medications as directed. Please refer to the Current Medication list given to you today.  *If you need a refill on your cardiac medications before your next appointment, please call your pharmacy*    Follow-Up:  AS NEEDED WITH DR. LONNI

## 2023-11-27 NOTE — Progress Notes (Signed)
 Cardiology Office Note:  .   Date:  11/27/2023  ID:  Valerie [name], DOB Feb 12, 1971, MRN [id number] PCP: Wendolyn Jenkins Jansky, MD  [facility] Providers Cardiologist:  Shelda Bruckner, MD {  History of Present Illness: .   Valerie [name] is a 53 y.o. female with a hx of mitral regurgitation, GERD, Hashimoto's disease, and cancer. I met her 12/25/2021 for evaluation of abnormal echocardiogram (abnormal strain compared to prior).  Pertinent CV history: seen 2023 for change in strain pattern on echo. Also noted cough, DOE, intermittent LE edema. Personally reviewed images, strain did not track well. Diastolic function normal. No significant valve disease. Follow up PRN.  Today: She messaged the office 10/30/2023, noted LE edema and shortness of breath but also fatigue, lightheadedness. Blood pressure log largely normal. Was hydrating, using compression. Echo repeated 11/25/2023, unremarkable except for slightly abnormal strain pattern.  Has had vertigo in the past, these symptoms are not like this. Sometimes happens sitting still, sometimes happens after activity. Does feel that she gets hot quickly with activity. Has history of remote vertigo but not recently. Reviewed her recent labs. Was trialing a CGM but fell off yesterday.  Leg swelling has been better with cooler weather, hasn't worn compression stockings in a few days.  Frustrated with lack of weight loss despite diet and exercise. Just [facility].  ROS: Denies shortness of breath at rest or with normal exertion. No PND, orthopnea. No syncope or palpitations. ROS otherwise negative except as noted.   Studies Reviewed: SABRA    EKG:  EKG Interpretation Date/Time:  Friday November 27 2023 11:57:08 EDT Ventricular Rate:  82 PR Interval:  174 QRS Duration:  74 QT Interval:  368 QTC Calculation: 429 R Axis:   67  Text Interpretation: Normal sinus rhythm Normal ECG Confirmed by  Bruckner Shelda [phone]) on 11/27/2023 12:35:33 PM    Physical Exam:   VS:  LMP 12/23/2021 (Exact Date)    Wt Readings from Last 3 Encounters:  11/26/23 189 lb (85.7 kg)  11/26/23 189 lb (85.7 kg)  11/16/23 193 lb (87.5 kg)    Orthostatic VS for the past 24 hrs (Last 3 readings):  BP- Lying Pulse- Lying BP- Sitting Pulse- Sitting BP- Standing at 0 minutes Pulse- Standing at 0 minutes BP- Standing at 3 minutes Pulse- Standing at 3 minutes  11/27/23 1208 119/82 83 112/79 84 106/73 85 111/79 87     GEN: Well nourished, well developed in no acute distress HEENT: Normal, moist mucous membranes NECK: No JVD CARDIAC: regular rhythm, normal S1 and S2, no rubs or gallops. No murmur. VASCULAR: Radial and DP pulses 2+ bilaterally. No carotid bruits RESPIRATORY:  Clear to auscultation without rales, wheezing or rhonchi  ABDOMEN: Soft, non-tender, non-distended MUSCULOSKELETAL:  Ambulates independently SKIN: Warm and dry, no edema NEUROLOGIC:  Alert and oriented x 3. No focal neuro deficits noted. PSYCHIATRIC:  Normal affect    ASSESSMENT AND PLAN: .    Postural lightheadedness Abnormal strain on echocardiogram Dyspnea on exertion Intermittent bilateral LE edema -orthostatics negative -echo unremarkable except for mildly reduced strain, we discussed results today -given workup, does not appear cardiac in etiology -we discussed compression stockings, elevation, salt avoidance for LE edema as this may have venous insufficiency component -labs largely unremarkable -uses diuretic PRN but does not feel that it makes much difference. Would avoid unless significant swelling to avoid intravascular volume depletion given lightheadedness -question if there is vasomotor component. She had  hysterectomy and removal of one ovary last year, still has one ovary. Also working with Dr. Vianne with her thyroid  dosing to see if this makes a difference.  CV risk counseling and prevention -recommend heart  healthy/Mediterranean diet, with whole grains, fruits, vegetable, fish, lean meats, nuts, and olive oil. Limit salt. -recommend moderate walking, 3-5 times/week for 30-50 minutes each session. Aim for at least 150 minutes.week. Goal should be pace of 3 miles/hours, or walking 1.5 miles in 30 minutes -recommend avoidance of tobacco products. Avoid excess alcohol. -ASCVD risk score: The 10-year ASCVD risk score (Arnett DK, et al., 2019) is: 1.1%   Values used to calculate the score:     Age: 80 years     Clincally relevant sex: Female     Is Non-Hispanic African American: No     Diabetic: No     Tobacco smoker: No     Systolic Blood Pressure: 105 mmHg     Is BP treated: No     HDL Cholesterol: 56 mg/dL     Total Cholesterol: 188 mg/dL    Dispo: I would be happy to see her back as needed  Signed, Shelda Bruckner, MD   Shelda Bruckner, MD, PhD, [facility]  [facility]  [place]  [facility] at [facility] at [facility] [address] [place], [place] [address] [phone]

## 2023-11-30 ENCOUNTER — Encounter: Payer: Self-pay | Admitting: Rehabilitation

## 2023-12-02 ENCOUNTER — Encounter: Payer: Self-pay | Admitting: Neurology

## 2023-12-02 ENCOUNTER — Ambulatory Visit: Payer: Commercial Managed Care - PPO | Admitting: Neurology

## 2023-12-02 VITALS — BP 120/81 | HR 87 | Ht 65.0 in | Wt 197.0 lb

## 2023-12-02 DIAGNOSIS — H469 Unspecified optic neuritis: Secondary | ICD-10-CM

## 2023-12-02 DIAGNOSIS — Z79899 Other long term (current) drug therapy: Secondary | ICD-10-CM

## 2023-12-02 DIAGNOSIS — C50312 Malignant neoplasm of lower-inner quadrant of left female breast: Secondary | ICD-10-CM

## 2023-12-02 DIAGNOSIS — Z170 Estrogen receptor positive status [ER+]: Secondary | ICD-10-CM

## 2023-12-02 DIAGNOSIS — G3781 Myelin oligodendrocyte glycoprotein antibody disease: Secondary | ICD-10-CM

## 2023-12-02 DIAGNOSIS — E559 Vitamin D deficiency, unspecified: Secondary | ICD-10-CM

## 2023-12-02 NOTE — Progress Notes (Signed)
 [facility]  PATIENT: Valerie [name] DOB: [birth year]  REFERRING DOCTOR OR PCP:  Dr. Cindy SOURCE: Patient, notes from recent hospitalization, laboratory results, MRI images personally reviewed.  _________________________________   HISTORICAL  CHIEF COMPLAINT:  Chief Complaint  Patient presents with   RM10/MOGAD    Pt is here Alone. Pt states that she has been pretty good.     HISTORY OF PRESENT ILLNESS:  Valerie [name], is a 53 y.o. 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   She gets free drug through [place].  She notes feeling tired after some of her shots. She notes vision imporved but ot to baseline.   She    She saw ophthalmology Idell Pinal) in October 2024 and exam and OCT are stable.     Vision improved at or near baseline.  She had three episodes of ON, the right December 2021 and the left 05/05/2020 and left January 2023 (while on Rituxan).   The left optic neuritis in 2020 was a worse episode with almost complete blindness.  Vision has improved near 20/20.   Colors are slightly desaturated OS and visual resolution is symmetric  She has occasioanl episdoes of vertigo  She saw ENT and no issues seen.  She notes if she looks around quickly she feels slight vertigo.      She notes some itching in the eyes .  Feels like soemting is in her eyes.  Eyedrops help.    She has not had any spinal cord syndrome. She has had some electric shock sensation.    Gait is good (though using cane due to THR 5 wks ago).   Sometimes she drops items but denies any significant weakness.    She denies any change in her gait or balance now but had some issues in January.   She notes no difficulty with strength or sensation.  Bladder function is fine.  She notes occasionally brain fog.   She sleeps well.     She had calcifications on the left breast and had a biopsy.   She was diagnosed with left breast  cancer summer 2022 - she did a biopsy, followed by surgery and radiation.SABRA  Lymph nodes were clean at surgery.   She was on Tamoxifen   but felt poorly so stopped at 18 months.    Last mammogram was fine.   She has lymphedema.  She had a hysterectomy 04/23/2022 .    She sees Dr. Gudena.  She had hip surgery in December and is using a cane   She has hypothyroidism and is on Synthroid .    sees endocrinology.  A glucose monitor  to evaluate possible mild DM but it was fine.   She did feel more tired when   Weight has increased 50 pounds in 10 years - 20 the past 3 years.     HISTORY MOGAD / ON She  had right >> left eye pain at the end of December 2021.  On 04/20/2020 she began to have reduced vision OD and photophobia.    She saw an ophthalmologist who felt she had dry eyes.   She saw Dr. Particia Pinal the next day who diagnosed her with right optic neuritis.  She was referred to Dr. Jarold who did OCT c/with optic neuritis.   She was admittted 04/24/2020 to 04/29/2020 and received 5 days IV Solu-Medrol .   Vision improved with the steroids.  Upon discharge, she still had reduced visual acuity but good color vision.   She had only mild eye aching and mild photophobia.    On 05/05/2020,  she had aching in the left eye followed by complete loss of vision.  Steroids helped.  I initially placed her on azathioprine  but she had trouble tolerating it.  We were able to get her on Rituxan.  She had another visual optic neuritis exacerbation early 2023 prompting switch from Rituxan to Actemra .  Around the time she started March 2023 she noted slight visual blurring and eye temporarily increase her prednisone .  She was able to completely come off of prednisone  and May 2023.    Data review: Imaging: MRI of the brain 04/24/2020 was normal.  MRI of the orbits 04/24/2020 showed abnormal signal with enhancement in the right optic nerve consistent with optic neuritis.  MRI of the cervical spine and MRI of the thoracic spine  04/24/2020 showed normal spinal cord and some degenerative changes with moderate right foraminal narrowing at C5-C6.  Laboratory data: Labs 04/25/2020:   Anti-MOG antibodies were high titer positive (1: 320).  HIV was negative.   ANA/Sjogrn's antibodies were negative.   RPR was negative,  Anti-NMO Ab was negative,   HgbA1c was normal 5.4.  Sugars ranged 110-183.     Labs 08/21/2023:  LDL mildly elevated at 110 , TChl 188, LFTs normal.      REVIEW OF SYSTEMS: Constitutional: No fevers, chills, sweats, or change in appetite Eyes: No visual changes, double vision, eye pain Ear, nose and throat: No hearing loss, ear pain, nasal congestion, sore throat Cardiovascular: No chest pain, palpitations Respiratory:  No shortness of breath at rest or with exertion.   No wheezes GastrointestinaI: No nausea, vomiting, diarrhea, abdominal pain, fecal incontinence Genitourinary:  No dysuria, urinary retention or frequency.  No nocturia. Musculoskeletal:  No neck pain, back pain Integumentary: No rash, pruritus, skin lesions Neurological: as above Psychiatric: No depression at this time.  No anxiety Endocrine: No palpitations, diaphoresis, change in appetite, change in weigh or increased thirst Hematologic/Lymphatic:  No anemia, purpura, petechiae. Allergic/Immunologic: No itchy/runny eyes, nasal congestion, recent allergic reactions, rashes  ALLERGIES: No Known Allergies  HOME MEDICATIONS:  Current Outpatient Medications:    ACTEMRA  ACTPEN 162 MG/0.9ML SOAJ, Inject 0.9 mLs into the skin once a week., Disp: 10.8 mL, Rfl: 1   ascorbic acid  (VITAMIN C) 1000 MG tablet, Take 1 tablet by mouth daily., Disp: , Rfl:    cholecalciferol  (VITAMIN D3) 25 MCG (1000 UNIT) tablet, Take 5,000 Units by mouth daily., Disp: , Rfl:    levocetirizine (XYZAL ) 5 MG tablet, Take 1 tablet (5 mg total) by mouth every evening., Disp: 90 tablet, Rfl: 0   levothyroxine  (SYNTHROID ) 88 MCG tablet, TAKE 1 TABLET BY MOUTH 6 DAYS  PER  WEEK, Disp: 78 tablet, Rfl: 3   MAGNESIUM GLYCINATE PO, Take by mouth., Disp: , Rfl:    Olopatadine-Mometasone (RYALTRIS ) 665-25 MCG/ACT SUSP, 1-2 sprays per nostril twice a day, Disp: 87 g, Rfl: 3   omeprazole  (PRILOSEC) 40 MG capsule, TAKE 1 CAPSULE (40 MG TOTAL) BY MOUTH IN THE MORNING AND AT BEDTIME. (NOT COVERED), Disp: 180 capsule, Rfl: 2   Probiotic Product (CULTRELLE KIDS IMMUNE DEFENSE PO), Take 1 tablet by mouth daily., Disp: , Rfl:    UNABLE TO FIND, Take 4 tablets by mouth daily. Med Name: Omega Pure 2 tablet in AM and 1 tablet in PM, Disp: , Rfl:    UNABLE TO FIND,  Take by mouth daily. Med Name: Ultra Inflamix 2 scoops daily, Disp: , Rfl:    UNABLE TO FIND, Take 2 tablets by mouth daily. Med Name: R-Lipoic Acid, Disp: , Rfl:    MAGNESIUM GLUCONATE PO, Take 2 tablets by mouth daily at 12 noon. (Patient not taking: Reported on 12/02/2023), Disp: , Rfl:   PAST MEDICAL HISTORY: Past Medical History:  Diagnosis Date   Abnormal echocardiogram 11/28/2021   EF 60 -65%, abnormal global longitudinal strain, see cardiology OV in Epic dated 12/25/21   Allergy  4/25   See notes from Dr Luke   Breast cancer [facility])    Cancer [facility]) 10/2020   left breast, ER+, s/p radiation therapy 12/25/20 - 01/18/21, Patient follows with Dr. Mackey Chad @ [facility].   COVID-19 11/2019   flu-like symptoms, cough, no hospitalizations   Edema    lower extremity, follows w/ [facility], Sherrilee Dinning, [place]. See OV dated 03/24/22 in Epic.   GERD (gastroesophageal reflux disease)    Follows w/ Dr. Rosario Guppy, gastroenterologist and PCP, Dr. Jenkins Jansky [facility] @ [facility].   Hashimoto's disease    Follows with Dr. Tawni Fendt @ [place].   History of hiatal hernia    2 cm hiatal hernia per 01/08/22 EGD   History of radiation therapy 2022   completed 01-18-2021   [facility] (hard of hearing)    has gotten bilateral aides.   Hypothyroidism    Follows w/ endocronologist, Dr. Tawni Fendt @  [place].   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 Charlie Crete @ [facility].   Optic neuritis    Follows w/ opthamology, Dr. 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), 04/2020 left eye   Personal history of radiation therapy    PONV (postoperative nausea and vomiting)    Status post dilation of esophageal narrowing 01/08/2022   EGD, esophagus dilated   Wears glasses     PAST SURGICAL HISTORY: Past Surgical History:  Procedure Laterality Date   ABDOMINAL HYSTERECTOMY  04/23/22   BIOPSY BREAST Left 10/17/2020   BREAST LUMPECTOMY     BREAST LUMPECTOMY WITH RADIOACTIVE SEED AND SENTINEL LYMPH NODE BIOPSY Left 11/15/2020   Procedure: LEFT BREAST LUMPECTOMY WITH RADIOACTIVE SEED AND LEFT SENTINEL LYMPH NODE BIOPSY;  Surgeon: Vanderbilt Ned, MD;  Location: [facility];  Service: General;  Laterality: Left;   BREAST SURGERY  August 2022   Cancer surgery   COLONOSCOPY  09/06/2021   cecal polyp   CT CORONARY CA SCORING  07/13/2020   Coronary Calcium = 0 (in Epic)   CYSTOSCOPY N/A 04/23/2022   Procedure: CYSTOSCOPY;  Surgeon: Pinal Ronal RAMAN, MD;  Location: [facility];  Service: Gynecology;  Laterality: N/A;   ESOPHAGOGASTRODUODENOSCOPY  01/08/2022   2cm hiatal hernia, esophagus dilated   TOTAL LAPAROSCOPIC HYSTERECTOMY WITH SALPINGECTOMY Bilateral 04/23/2022   Procedure: TOTAL LAPAROSCOPIC HYSTERECTOMY BILATERAL SALPINGECTOMY, LEFT OOPHORECTOMY;  Surgeon: Pinal Ronal RAMAN, MD;  Location: [facility];  Service: Gynecology;  Laterality: Bilateral;   TRANSTHORACIC ECHOCARDIOGRAM  11/28/2021   see results in Epic   TRANSTHORACIC ECHOCARDIOGRAM  07/05/2020   in Epic   TUBAL LIGATION  02/2012   WISDOM TOOTH EXTRACTION     early 1990's    FAMILY HISTORY: Family History   Problem Relation Age of  Onset   Hearing loss Mother    Thyroid  disease Mother    Cancer Mother 39       endometrial cancer, lynch negative   GER disease Mother    Other Mother        uterine fibroids, optic disc abnormality    Eczema Mother    Colon polyps Mother    Hypothyroidism Mother    Osteoarthritis Mother    Hypertension Mother    Migraines Mother    Mitral valve prolapse Father        regurgitation/prolase   Colon polyps Father    Hearing loss Father    Other Father        Colon adenoma   Hyperlipidemia Father    GER disease Father    Heart murmur Father    Amblyopia Father        Right eye   Neuropathy Father    Aortic aneurysm Father    Heart murmur Brother    Glaucoma Maternal Grandmother    Macular degeneration Maternal Grandmother    Arthritis Maternal Grandmother    Fainting Maternal Grandmother    Arthritis Maternal Grandfather    Multiple sclerosis Paternal Grandmother    Heart murmur Paternal Grandfather    Heart disease Paternal Grandfather    Diabetes Maternal Aunt    Heart murmur Paternal Aunt    Breast cancer Neg Hx    Colon cancer Neg Hx    Esophageal cancer Neg Hx    Rectal cancer Neg Hx    Stomach cancer Neg Hx     SOCIAL HISTORY:  Social History   Socioeconomic History   Marital status: Married    Spouse name: Not on file   Number of children: 2   Years of education: BS   Highest education level: Bachelor's degree (e.g., BA, AB, BS)  Occupational History   Occupation: Psychologist, occupational   Occupation: Shipping and receiving clerk  Tobacco Use   Smoking status: Never   Smokeless tobacco: Never  Vaping Use   Vaping status: Never Used  Substance and Sexual Activity   Alcohol use: Never   Drug use: Never   Sexual activity: Yes    Birth control/protection: Surgical, None    Comment: hysterectomy  Other Topics Concern   Not on file  Social History Narrative   Right handed    Caffeine use: rare   Social Drivers of Health    Financial Resource Strain: Low Risk  (11/12/2023)   Overall Financial Resource Strain (CARDIA)    Difficulty of Paying Living Expenses: Not hard at all  Food Insecurity: No Food Insecurity (11/12/2023)   Hunger Vital Sign    Worried About Running Out of Food in the Last Year: Never true    Ran Out of Food in the Last Year: Never true  Transportation Needs: No Transportation Needs (11/12/2023)   PRAPARE - Administrator, Civil Service (Medical): No    Lack of Transportation (Non-Medical): No  Physical Activity: Sufficiently Active (11/12/2023)   Exercise Vital Sign    Days of Exercise per Week: 5 days    Minutes of Exercise per Session: 50 min  Stress: No Stress Concern Present (11/12/2023)   [employer] of Occupational Health - Occupational Stress Questionnaire    Feeling of Stress: Only a little  Social Connections: Socially Integrated (11/12/2023)   Social Connection and Isolation Panel    Frequency of Communication with Friends and Family: Three times a week    Frequency  of Social Gatherings with Friends and Family: Twice a week    Attends Religious Services: More than 4 times per year    Active Member of [employer] or Organizations: Yes    Attends Engineer, structural: More than 4 times per year    Marital Status: Married  Catering manager Violence: Not At Risk (03/19/2021)   Humiliation, Afraid, Rape, and Kick questionnaire    Fear of Current or Ex-Partner: No    Emotionally Abused: No    Physically Abused: No    Sexually Abused: No     PHYSICAL EXAM  Vitals:   12/02/23 1558  BP: 120/81  Pulse: 87  SpO2: 96%  Weight: 197 lb (89.4 kg)  Height: 5' 5 (1.651 m)    Body mass index is 32.78 kg/m.  VA:   20/20 OS and 20/20-1 OD       General: The patient is well-developed and well-nourished and in no acute distress  HEENT:  Head is [place]/AT.  Sclera are anicteric.    Skin: She has lymphedema in the left arm.   Neurologic Exam  Mental status:  The patient is alert and oriented x 3 at the time of the examination. The patient has apparent normal recent and remote memory, with an apparently normal attention span and concentration ability.   Speech is normal.  Cranial nerves: Extraocular movements are full.   She can move her eyes without pain.  No APD today.  Color vision is now symmetric..  Facial strength and sensation was normal.  Hearing was symmetric.  Motor:  Muscle bulk is normal.   Tone is normal. Strength is  5 / 5 in all 4 extremities.   Sensory: Sensory testing is intact to pinprick, soft touch and vibration sensation in all 4 extremities.  Coordination: Cerebellar testing reveals good finger-nose-finger and heel-to-shin bilaterally.  Gait and station: Station is normal.  Her gait is normal and the tandem gait is mildly wide..  Romberg negative.  Reflexes: Deep tendon reflexes are symmetric and normal bilaterally.     DIAGNOSTIC DATA (LABS, IMAGING, TESTING) - I reviewed patient records, labs, notes, testing and imaging myself where available.  Lab Results  Component Value Date   WBC 5.7 11/27/2023   HGB 15.3 (H) 11/27/2023   HCT 45.8 11/27/2023   MCV 96.2 11/27/2023   PLT 193.0 11/27/2023      Component Value Date/Time   NA 138 11/12/2023 0758   NA 142 05/06/2023 1638   K 4.5 11/12/2023 0758   CL 105 11/12/2023 0758   CO2 29 11/12/2023 0758   GLUCOSE 96 11/12/2023 0758   BUN 18 11/12/2023 0758   BUN 24 05/06/2023 1638   CREATININE 0.99 11/12/2023 0758   CREATININE 0.97 08/21/2023 1613   CALCIUM 8.6 11/12/2023 0758   PROT 6.7 11/12/2023 0758   PROT 6.7 05/06/2023 1638   ALBUMIN 4.0 11/12/2023 0758   ALBUMIN 4.2 05/06/2023 1638   AST 22 11/12/2023 0758   AST 20 11/08/2021 1016   ALT 29 11/12/2023 0758   ALT 26 11/08/2021 1016   ALKPHOS 33 (L) 11/12/2023 0758   BILITOT 0.5 11/12/2023 0758   BILITOT 0.2 05/06/2023 1638   BILITOT 0.4 11/08/2021 1016   GFRNONAA >60 08/29/2022 0945   GFRNONAA >60  11/08/2021 1016   Lab Results  Component Value Date   CHOL 188 08/21/2023   HDL 56 08/21/2023   LDLCALC 110 (H) 08/21/2023   TRIG 114 08/21/2023   CHOLHDL 3.4 08/21/2023  Lab Results  Component Value Date   HGBA1C 5.5 08/21/2023       ASSESSMENT AND PLAN  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    Her MOGAD continues to be stable on Actemra  and she will continue.  Lab work showed slightly elevated LDL but was otherwise normal.  We discussed weight loss.  If LDL continues to rise she may need to go on a medication. She is advised to get a lipid panel at least once a year. 2.   Stay active.  Continue Vit D 3.   Follow-up with Dr. Gudena for breast cancer.   4.   rtc 6 months    Danyell Awbrey A. Vear, MD, [facility] 12/02/2023, 5:16 PM Certified in Neurology, Clinical Neurophysiology, Sleep Medicine and Neuroimaging  [facility] [address] [place], [place] [address] [phone]

## 2023-12-04 ENCOUNTER — Other Ambulatory Visit (HOSPITAL_BASED_OUTPATIENT_CLINIC_OR_DEPARTMENT_OTHER)

## 2023-12-06 ENCOUNTER — Encounter (HOSPITAL_BASED_OUTPATIENT_CLINIC_OR_DEPARTMENT_OTHER): Payer: Self-pay | Admitting: Cardiology

## 2023-12-07 ENCOUNTER — Ambulatory Visit

## 2023-12-08 ENCOUNTER — Encounter: Payer: Self-pay | Admitting: Internal Medicine

## 2023-12-08 ENCOUNTER — Ambulatory Visit (INDEPENDENT_AMBULATORY_CARE_PROVIDER_SITE_OTHER): Payer: Commercial Managed Care - PPO | Admitting: Internal Medicine

## 2023-12-08 VITALS — BP 120/70 | HR 73 | Ht 65.0 in | Wt 191.0 lb

## 2023-12-08 DIAGNOSIS — E063 Autoimmune thyroiditis: Secondary | ICD-10-CM | POA: Diagnosis not present

## 2023-12-08 MED ORDER — LEVOTHYROXINE SODIUM 88 MCG PO TABS: ORAL_TABLET | ORAL | 3 refills | Status: AC

## 2023-12-08 NOTE — Progress Notes (Signed)
 Patient ID: Valerie [name], female   DOB: 02-24-1971, 53 y.o.   MRN: [id number]  HPI  Valerie [name] is a 53 y.o.-year-old female, returning for follow-up for Hashimoto's hypothyroidism.  Last visit 1 year ago.  Interim history: She was in the ED with vertigo 2/2 allergies last year.  No recurrences. She established care with a weight management clinic recently. She wore a CGM earlier this month and the sugars appear to be mostly at goal with few exceptions.  Reviewed and addended history: Pt. has been dx with hypothyroidism in 1996 during infertility evaluation (while living in [place] ). She had 2 successful pregnancies since then, after starting on thyroid  hormone therapy.   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  She sees Dr. Vear at [facility] neurology.  She tried rituximab and azathioprine , but these did not work.  She was on steroids higher doses, and now on Actemra .  When I first saw her in 05/2021, she was not taking levothyroxine  correctly.  After she started to take it correctly, we had to gradually reduce the dose.  In 11/2021, we decreased LT4 dose to 88 mcg 6/7 days (~75 mcg daily).  In 01/2022, we increased LT4 dose back to 88 mcg daily.  In 05/2022, we decreased LT4 dose back to 88 mcg 6/7 days.  In 11/2023, we increased the dose to 88 mcg daily.  She is on Levothyroxine  88 mcg daily: - fasting, at 5 am - prev. along with tamoxifen  >> off now - with water - separated by 2h from b'fast  - no iron - + PPIs (at 9 am - Omeprazole ) -  off Biotin  I reviewed pt's thyroid  tests: Lab Results  Component Value Date   TSH 2.46 11/12/2023   TSH 2.52 08/21/2023   TSH 2.44 03/09/2023   TSH 4.79 12/08/2022   TSH 1.664 08/29/2022   TSH 0.64 07/17/2022   TSH 0.15 (L) 06/09/2022   TSH 0.90 03/04/2022   TSH 8.75 (H) 01/22/2022   TSH 0.32 (L) 12/05/2021   FREET4 1.1 03/09/2023   FREET4 0.90 12/08/2022   FREET4  1.07 07/17/2022   FREET4 1.16 06/09/2022   FREET4 1.07 03/04/2022   FREET4 0.77 01/22/2022   FREET4 0.99 12/05/2021   FREET4 1.31 10/16/2021   FREET4 1.20 08/29/2021   FREET4 1.29 07/26/2021   T3FREE 2.3 06/11/2021   Previously: 10/08/2020: TSH 1.48 08/24/2020: TSH 7.5 07/04/2020: TSH 16.47 10/28/2019: TSH 2.59 08/11/2019: TSH 1.49 06/13/2019: TSH 0.16 (0.34-4.5)  2019-06-13     FT4 1.22   0.61-1.12  TSH   2019-01-05    TSH 1.26   0.34-4.50  TSH   2018-10-22    TSH 7.25   0.34-4.50  TSH   2018-09-08    TSH 22.28   0.34-4.50  Free T4   2018-09-08    FT4 0.63   0.61-1.12  T3, Free   2018-09-08    FT3 2.25   2.50-3.90  TSH   2018-07-21    TSH 2.81   0.34-4.50  TSH   2018-06-07    TSH 5.88   0.34-4.50  Free T4   2018-06-07    FT4 1.06   0.61-1.12  T3, Free   2018-06-07    FT3 2.60   2.50-3.90   Thyroid  Panel With TSH ([id number])   2018-06-07    Thyroxine (T4) 8.4   4.5-12.0  T3 Uptake 31   24-39  Free Thyroxine Index 2.6   1.2-4.9  TSH  2018-02-23    TSH 1.07   0.34-4.50  TSH   2017-11-19    TSH 2.43   0.34-4.50  TSH   2017-09-10    TSH 0.26   0.34-4.50  Free T4   2017-09-10    FT4 1.00   0.61-1.12  TSH   2017-07-17    TSH 0.12   0.34-4.50  Free T4   2017-07-17    FT4 1.00   0.61-1.12   Antithyroid antibodies:    2018-06-07    Thyroid  Peroxidase (TPO) Ab 52   0-34  Thyroglobulin Antibody <1.0   0.0-0.9   She continues to have hot flashes and difficulty losing weight. In 2024, she stopped tamoxifen  b/c hot flushes, weight gain, ovarian cysts.   Pt denies feeling nodules in neck, hoarseness, dysphagia/odynophagia. She had Es stretching few years ago.  She has + FH of thyroid  disorders in: M. No FH of thyroid  cancer.  No h/o radiation tx to head or neck. No recent use of iodine  supplements.  Reviewing Dr. Micheline notes, she had investigation for hirsutism/adrenal disorders in the past: Cortisol, Salivary X2, Timed ([id number])   2017-06-08    #1 Salivary Cortisol  0.032      #2 Salivary Cortisol 0.022       Testosterone   2017-06-02    TESTO 45.5   8.0-48.0  Vitamin B12   2017-06-02    B12 1400   180-914  Vitamin D  25(OH) Total   2017-06-02    17-OH Progesterone LCMS ([id number])   2017-06-02    Androstenedione LCMS ([id number])   2017-06-02    DHEA-Sulfate ([id number])   2017-06-02    DHEA-Sulfate 181.2   41.2-243.7  IGF-1 ([id number])   2017-06-02    Insulin -Like Growth Factor I 134   57-195  Metanephrines, Frac., Pl. Free [phone])   2017-06-02    Metanephrine, Pl 23   0-62  Normetanephrine, Pl 132   0-145   Pt. also has a history of breast cancer, vitamin D  deficiency. She had a recurrence of her Lymphedema in 08/2021.  She also had plantar fasciitis in L foot in 08/2021.  She got shoe inserts - pain improved. She developed B leg swelling in 2023. No blood clots. She was started on diuretic - prn.   For exercise, she is stretching 5 times a week, also walking/biking occasionally.  ROS:+ see HPI  Past Medical History:  Diagnosis Date   Abnormal echocardiogram 11/28/2021   EF 60 -65%, abnormal global longitudinal strain, see cardiology OV in Epic dated 12/25/21   Allergy  4/25   See notes from Dr Luke   Breast cancer [facility])    Cancer [facility]) 10/2020   left breast, ER+, s/p radiation therapy 12/25/20 - 01/18/21, Patient follows with Dr. Mackey Chad @ [facility].   COVID-19 11/2019   flu-like symptoms, cough, no hospitalizations   Edema    lower extremity, follows w/ [facility], Sherrilee Dinning, [place]. See OV dated 03/24/22 in Epic.   GERD (gastroesophageal reflux disease)    Follows w/ Dr. Rosario Guppy, gastroenterologist and PCP, Dr. Jenkins Jansky [facility] @ [facility].   Hashimoto's disease    Follows with Dr. Tawni Fendt @ [facility].   History of hiatal hernia    2 cm hiatal hernia per 01/08/22 EGD   History of radiation therapy 2022   completed 01-18-2021   [facility] (hard of hearing)    has gotten bilateral aides.   Hypothyroidism     Follows w/ endocronologist, Dr. Tawni Fendt @  [facility].   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 Charlie Crete @ [facility].   Optic neuritis    Follows w/ opthamology, Dr. 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), 04/2020 left eye   Personal history of radiation therapy    PONV (postoperative nausea and vomiting)    Status post dilation of esophageal narrowing 01/08/2022   EGD, esophagus dilated   Wears glasses    Past Surgical History:  Procedure Laterality Date   ABDOMINAL HYSTERECTOMY  04/23/22   BIOPSY BREAST Left 10/17/2020   BREAST LUMPECTOMY     BREAST LUMPECTOMY WITH RADIOACTIVE SEED AND SENTINEL LYMPH NODE BIOPSY Left 11/15/2020   Procedure: LEFT BREAST LUMPECTOMY WITH RADIOACTIVE SEED AND LEFT SENTINEL LYMPH NODE BIOPSY;  Surgeon: Vanderbilt Ned, MD;  Location: [facility];  Service: General;  Laterality: Left;   BREAST SURGERY  August 2022   Cancer surgery   COLONOSCOPY  09/06/2021   cecal polyp   CT CORONARY CA SCORING  07/13/2020   Coronary Calcium = 0 (in Epic)   CYSTOSCOPY N/A 04/23/2022   Procedure: CYSTOSCOPY;  Surgeon: Pinal Ronal RAMAN, MD;  Location: [facility];  Service: Gynecology;  Laterality: N/A;   ESOPHAGOGASTRODUODENOSCOPY  01/08/2022   2cm hiatal hernia, esophagus dilated   TOTAL LAPAROSCOPIC HYSTERECTOMY WITH SALPINGECTOMY Bilateral 04/23/2022   Procedure: TOTAL LAPAROSCOPIC HYSTERECTOMY BILATERAL SALPINGECTOMY, LEFT OOPHORECTOMY;  Surgeon: Pinal Ronal RAMAN, MD;  Location: [facility];  Service: Gynecology;  Laterality: Bilateral;   TRANSTHORACIC ECHOCARDIOGRAM  11/28/2021   see results in Epic   TRANSTHORACIC ECHOCARDIOGRAM  07/05/2020   in Epic   TUBAL LIGATION  02/2012   WISDOM TOOTH EXTRACTION     early 1990's   Social  History   Socioeconomic History   Marital status: Married    Spouse name: Not on file   Number of children: 2   Years of education: BS   Highest education level: Bachelor's degree (e.g., BA, AB, BS)  Occupational History   Occupation: Psychologist, occupational   Occupation: Shipping and receiving clerk  Tobacco Use   Smoking status: Never   Smokeless tobacco: Never  Vaping Use   Vaping status: Never Used  Substance and Sexual Activity   Alcohol use: Never   Drug use: Never   Sexual activity: Yes    Birth control/protection: Surgical, None    Comment: hysterectomy  Other Topics Concern   Not on file  Social History Narrative   Right handed    Caffeine use: rare   Social Drivers of Health   Financial Resource Strain: Low Risk  (11/12/2023)   Overall Financial Resource Strain (CARDIA)    Difficulty of Paying Living Expenses: Not hard at all  Food Insecurity: No Food Insecurity (11/12/2023)   Hunger Vital Sign    Worried About Running Out of Food in the Last Year: Never true    Ran Out of Food in the Last Year: Never true  Transportation Needs: No Transportation Needs (11/12/2023)   PRAPARE - Administrator, Civil Service (Medical): No    Lack of Transportation (Non-Medical): No  Physical Activity: Sufficiently Active (11/12/2023)   Exercise Vital Sign    Days of Exercise per Week: 5 days    Minutes of Exercise per Session: 50 min  Stress: No Stress  Concern Present (11/12/2023)   [employer] of Occupational Health - Occupational Stress Questionnaire    Feeling of Stress: Only a little  Social Connections: Socially Integrated (11/12/2023)   Social Connection and Isolation Panel    Frequency of Communication with Friends and Family: Three times a week    Frequency of Social Gatherings with Friends and Family: Twice a week    Attends Religious Services: More than 4 times per year    Active Member of [employer] or Organizations: Yes    Attends Engineer, structural:  More than 4 times per year    Marital Status: Married  Catering manager Violence: Not At Risk (03/19/2021)   Humiliation, Afraid, Rape, and Kick questionnaire    Fear of Current or Ex-Partner: No    Emotionally Abused: No    Physically Abused: No    Sexually Abused: No   Current Outpatient Medications on File Prior to Visit  Medication Sig Dispense Refill   ACTEMRA  ACTPEN 162 MG/0.9ML SOAJ Inject 0.9 mLs into the skin once a week. 10.8 mL 1   ascorbic acid  (VITAMIN C) 1000 MG tablet Take 1 tablet by mouth daily.     cholecalciferol  (VITAMIN D3) 25 MCG (1000 UNIT) tablet Take 5,000 Units by mouth daily.     levocetirizine (XYZAL ) 5 MG tablet Take 1 tablet (5 mg total) by mouth every evening. 90 tablet 0   levothyroxine  (SYNTHROID ) 88 MCG tablet TAKE 1 TABLET BY MOUTH 6 DAYS  PER WEEK 78 tablet 3   MAGNESIUM GLUCONATE PO Take 2 tablets by mouth daily at 12 noon. (Patient not taking: Reported on 12/02/2023)     MAGNESIUM GLYCINATE PO Take by mouth.     Olopatadine-Mometasone (RYALTRIS ) 665-25 MCG/ACT SUSP 1-2 sprays per nostril twice a day 87 g 3   omeprazole  (PRILOSEC) 40 MG capsule TAKE 1 CAPSULE (40 MG TOTAL) BY MOUTH IN THE MORNING AND AT BEDTIME. (NOT COVERED) 180 capsule 2   Probiotic Product (CULTRELLE KIDS IMMUNE DEFENSE PO) Take 1 tablet by mouth daily.     UNABLE TO FIND Take 4 tablets by mouth daily. Med Name: Omega Pure 2 tablet in AM and 1 tablet in PM     UNABLE TO FIND Take by mouth daily. Med Name: Ultra Inflamix 2 scoops daily     UNABLE TO FIND Take 2 tablets by mouth daily. Med Name: R-Lipoic Acid     No current facility-administered medications on file prior to visit.   No Known Allergies Family History  Problem Relation Age of Onset   Hearing loss Mother    Thyroid  disease Mother    Cancer Mother 24       endometrial cancer, lynch negative   GER disease Mother    Other Mother        uterine fibroids, optic disc abnormality    Eczema Mother    Colon polyps Mother     Hypothyroidism Mother    Osteoarthritis Mother    Hypertension Mother    Migraines Mother    Mitral valve prolapse Father        regurgitation/prolase   Colon polyps Father    Hearing loss Father    Other Father        Colon adenoma   Hyperlipidemia Father    GER disease Father    Heart murmur Father    Amblyopia Father        Right eye   Neuropathy Father    Aortic aneurysm Father  Heart murmur Brother    Glaucoma Maternal Grandmother    Macular degeneration Maternal Grandmother    Arthritis Maternal Grandmother    Fainting Maternal Grandmother    Arthritis Maternal Grandfather    Multiple sclerosis Paternal Grandmother    Heart murmur Paternal Grandfather    Heart disease Paternal Grandfather    Diabetes Maternal Aunt    Heart murmur Paternal Aunt    Breast cancer Neg Hx    Colon cancer Neg Hx    Esophageal cancer Neg Hx    Rectal cancer Neg Hx    Stomach cancer Neg Hx    PE: BP 120/70   Pulse 73   Ht 5' 5 (1.651 m)   Wt 191 lb (86.6 kg) Comment: at home, 197 on our scale  LMP 12/23/2021 (Exact Date)   SpO2 95%   BMI 31.78 kg/m  Wt Readings from Last 15 Encounters:  12/08/23 191 lb (86.6 kg)  12/02/23 197 lb (89.4 kg)  11/26/23 189 lb (85.7 kg)  11/26/23 189 lb (85.7 kg)  11/16/23 193 lb (87.5 kg)  10/26/23 192 lb 8 oz (87.3 kg)  09/22/23 195 lb 12 oz (88.8 kg)  09/16/23 193 lb (87.5 kg)  08/21/23 192 lb 2 oz (87.1 kg)  07/27/23 190 lb 6 oz (86.4 kg)  07/14/23 190 lb 6.4 oz (86.4 kg)  06/29/23 190 lb (86.2 kg)  06/25/23 189 lb (85.7 kg)  06/19/23 189 lb 12.8 oz (86.1 kg)  05/14/23 190 lb 14.4 oz (86.6 kg)   Constitutional: overweight, in NAD Eyes: no exophthalmos ENT: no masses palpated in neck, no cervical lymphadenopathy Cardiovascular: RRR, No MRG Respiratory: CTA B Musculoskeletal: no deformities Skin:  no rashes Neurological: no tremor with outstretched hands  ASSESSMENT: 1. Hypothyroidism  PLAN:  1. Patient with  longstanding  hypothyroidism on levothyroxine  therapy -She does not describe any neck compression symptoms.  She had a thyroid  ultrasound in the past without significant findings, per her recall. - latest thyroid  labs reviewed with pt. >> normal: Lab Results  Component Value Date   TSH 2.46 11/12/2023  - she was on LT4 88 mcg 6 out of 7 days previously, but approximately 2 weeks ago we increased the dose to 88 mcg daily.  She will be due for another set of labs in 1.5 months.  These were ordered for her today. - pt feels good on this dose.  She previously described heat intolerance, possibly due to tamoxifen , but she stopped this before last visit.  She is frustrated about the lack of weight loss.  She just establish care with the weight management clinic.  We discussed that with losing weight, she may need a slightly lower dose of LT4. - we discussed about taking the thyroid  hormone every day, with water, >30 minutes before breakfast, separated by >4 hours from acid reflux medications, calcium, iron, multivitamins. Pt. is taking it correctly.  She was previously taking a hormonal supplement with calcium to close levothyroxine .  We moved this later in the day and her TSH became suppressed so we were able to decrease the dose of LT4 afterwards. - will check thyroid  tests at next visit I will see her back in a year  Orders Placed This Encounter  Procedures   TSH   T4, free   I will send a new prescription for her LT4 88 to be taken daily rather than 6 out of 7 days.  Lela Fendt, MD PhD [facility]

## 2023-12-08 NOTE — Patient Instructions (Addendum)
Please continue Levothyroxine 88 mcg daily.  Take the thyroid hormone every day, with water, at least 30 minutes before breakfast, separated by at least 4 hours from: - acid reflux medications - calcium - iron - multivitamins  Please come back for a follow-up appointment in 1 year.  

## 2023-12-09 ENCOUNTER — Ambulatory Visit: Payer: Self-pay | Admitting: Family Medicine

## 2023-12-09 NOTE — Progress Notes (Signed)
 CT chest ok except for the hiatal hernia-already on omeprazole .

## 2023-12-21 ENCOUNTER — Ambulatory Visit (INDEPENDENT_AMBULATORY_CARE_PROVIDER_SITE_OTHER): Admitting: Otolaryngology

## 2023-12-21 VITALS — BP 102/69 | HR 87

## 2023-12-21 DIAGNOSIS — J3089 Other allergic rhinitis: Secondary | ICD-10-CM | POA: Diagnosis not present

## 2023-12-21 DIAGNOSIS — R0989 Other specified symptoms and signs involving the circulatory and respiratory systems: Secondary | ICD-10-CM

## 2023-12-21 DIAGNOSIS — R0981 Nasal congestion: Secondary | ICD-10-CM

## 2023-12-21 DIAGNOSIS — R0982 Postnasal drip: Secondary | ICD-10-CM | POA: Diagnosis not present

## 2023-12-21 DIAGNOSIS — K219 Gastro-esophageal reflux disease without esophagitis: Secondary | ICD-10-CM | POA: Diagnosis not present

## 2023-12-21 DIAGNOSIS — J342 Deviated nasal septum: Secondary | ICD-10-CM

## 2023-12-21 NOTE — Progress Notes (Signed)
 ENT CONSULT:  Reason for Consult: chronic throat clearing   HPI: Discussed the use of AI scribe software for clinical note transcription with the patient, who gave verbal consent to proceed.  History of Present Illness Valerie [name] is a 53 year old female who presents with chronic throat clearing. She was referred by Dr. Luke for evaluation of chronic throat clearing.  She has experienced chronic throat clearing for an extended period, describing it as 'going on forever.' Her mother had a similar issue, which was temporarily resolved by surgery, although she cannot recall the specific type of surgery. She is unsure if her condition is related to nasal drainage or another cause.  She has a history of heartburn and reflux and is currently taking omeprazole . She also uses Ryaltris  nasal spray, prescribed by Dr. Luke, with instructions to use up to two sprays twice a day. However, she experiences epistaxis when using the maximum dosage. She also takes Xyzal  over-the-counter, but it does not seem to alleviate her throat clearing symptoms.  She underwent allergy  testing, which revealed sensitivities to some grasses, trees, and cats. She owns two cats despite being allergic to them. She has tried nasal saline rinses in the past using a neti pot. She inquires about the necessity of taking allergy  medication year-round due to her indoor allergies, including dust mites.     Records Reviewed:  Allergy  Note 09/22/23  Chronic throat clearing Past history - chronic throat clearing for approximately 15 years without a clear seasonal pattern. Possible contributing factors include postnasal drip and reflux. No significant nasal symptoms such as congestion, sneezing, or itchy eyes. 2025 skin testing positive to grass, weed, trees, mold, dust mites, cat, horse.   Interim history - not much change but only used Ryaltris  once a day. Continue environmental control measures as below. Use over the counter  antihistamines such as Zyrtec (cetirizine), Claritin (loratadine), Allegra (fexofenadine), or Xyzal  (levocetirizine) daily as needed. May take twice a day during allergy  flares. May switch antihistamines every few months. Start Ryaltris  (olopatadine + mometasone nasal spray combination) 1-2 sprays per nostril TWICE a day.  Nasal saline spray (i.e., Simply Saline) or nasal saline lavage (i.e., NeilMed) is recommended as needed and prior to medicated nasal sprays. Consider allergy  injections for long term control if above medications do not help the symptoms.    Gastroesophageal reflux disease, unspecified whether esophagitis present Past history - Prednisone  use for MOGAD disease may have exacerbated reflux in the past. Follows with GI.  Interim history - noted worsening symptoms the past 1 month.  Continue lifestyle and dietary modifications. Start omeprazole  40mg  twice a day for 1 month and then go back to once a day - nothing to eat or drink for 20-30 minutes afterwards.    Past Medical History:  Diagnosis Date   Abnormal echocardiogram 11/28/2021   EF 60 -65%, abnormal global longitudinal strain, see cardiology OV in Epic dated 12/25/21   Allergy  4/25   See notes from Dr Luke   Breast cancer [facility])    Cancer [facility]) 10/2020   left breast, ER+, s/p radiation therapy 12/25/20 - 01/18/21, Patient follows with Dr. Mackey Chad @ [facility].   COVID-19 11/2019   flu-like symptoms, cough, no hospitalizations   Edema    lower extremity, follows w/ [facility], Sherrilee Dinning, [place]. See OV dated 03/24/22 in Epic.   GERD (gastroesophageal reflux disease)    Follows w/ Dr. Rosario Guppy, gastroenterologist and PCP, Dr. Jenkins [facility] @ [facility].   Hashimoto's disease    Follows with Dr. Tawni Fendt @ [place].   History of hiatal hernia    2 cm hiatal hernia per 01/08/22 EGD   History of radiation therapy 2022   completed 01-18-2021   [facility] (hard of hearing)    has gotten  bilateral aides.   Hypothyroidism    Follows w/ endocronologist, Dr. Tawni Fendt @ .   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 Charlie Crete @ [facility].   Optic neuritis    Follows w/ opthamology, Dr. 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), 04/2020 left eye   Personal history of radiation therapy    PONV (postoperative nausea and vomiting)    Status post dilation of esophageal narrowing 01/08/2022   EGD, esophagus dilated   Wears glasses     Past Surgical History:  Procedure Laterality Date   ABDOMINAL HYSTERECTOMY  04/23/22   BIOPSY BREAST Left 10/17/2020   BREAST LUMPECTOMY     BREAST LUMPECTOMY WITH RADIOACTIVE SEED AND SENTINEL LYMPH NODE BIOPSY Left 11/15/2020   Procedure: LEFT BREAST LUMPECTOMY WITH RADIOACTIVE SEED AND LEFT SENTINEL LYMPH NODE BIOPSY;  Surgeon: Vanderbilt Ned, MD;  Location: [facility];  Service: General;  Laterality: Left;   BREAST SURGERY  August 2022   Cancer surgery   COLONOSCOPY  09/06/2021   cecal polyp   CT CORONARY CA SCORING  07/13/2020   Coronary Calcium = 0 (in Epic)   CYSTOSCOPY N/A 04/23/2022   Procedure: CYSTOSCOPY;  Surgeon: Pinal Ronal RAMAN, MD;  Location: [facility];  Service: Gynecology;  Laterality: N/A;   ESOPHAGOGASTRODUODENOSCOPY  01/08/2022   2cm hiatal hernia, esophagus dilated   TOTAL LAPAROSCOPIC HYSTERECTOMY WITH SALPINGECTOMY Bilateral 04/23/2022   Procedure: TOTAL LAPAROSCOPIC HYSTERECTOMY BILATERAL SALPINGECTOMY, LEFT OOPHORECTOMY;  Surgeon: Pinal Ronal RAMAN, MD;  Location: [facility] [place];  Service: Gynecology;  Laterality: Bilateral;   TRANSTHORACIC ECHOCARDIOGRAM  11/28/2021   see results in Epic   TRANSTHORACIC ECHOCARDIOGRAM  07/05/2020   in Epic   TUBAL LIGATION  02/2012   WISDOM TOOTH  EXTRACTION     early 1990's    Family History  Problem Relation Age of Onset   Hearing loss Mother    Thyroid  disease Mother    Cancer Mother 10       endometrial cancer, lynch negative   GER disease Mother    Other Mother        uterine fibroids, optic disc abnormality    Eczema Mother    Colon polyps Mother    Hypothyroidism Mother    Osteoarthritis Mother    Hypertension Mother    Migraines Mother    Mitral valve prolapse Father        regurgitation/prolase   Colon polyps Father    Hearing loss Father    Other Father        Colon adenoma   Hyperlipidemia Father    GER disease Father    Heart murmur Father    Amblyopia Father        Right eye   Neuropathy Father    Aortic aneurysm Father    Heart murmur Brother    Glaucoma Maternal Grandmother    Macular degeneration Maternal Grandmother    Arthritis Maternal Grandmother    Fainting Maternal Grandmother  Arthritis Maternal Grandfather    Multiple sclerosis Paternal Grandmother    Heart murmur Paternal Grandfather    Heart disease Paternal Grandfather    Diabetes Maternal Aunt    Heart murmur Paternal Aunt    Breast cancer Neg Hx    Colon cancer Neg Hx    Esophageal cancer Neg Hx    Rectal cancer Neg Hx    Stomach cancer Neg Hx     Social History:  reports that she has never smoked. She has never used smokeless tobacco. She reports that she does not drink alcohol and does not use drugs.  Allergies: No Known Allergies  Medications: I have reviewed the patient's current medications.  The PMH, PSH, Medications, Allergies, and SH were reviewed and updated.  ROS: Constitutional: Negative for fever, weight loss and weight gain. Cardiovascular: Negative for chest pain and dyspnea on exertion. Respiratory: Is not experiencing shortness of breath at rest. Gastrointestinal: Negative for nausea and vomiting. Neurological: Negative for headaches. Psychiatric: The patient is not nervous/anxious  Blood pressure  102/69, pulse 87, last menstrual period 12/23/2021, SpO2 93%. There is no height or weight on file to calculate BMI.  PHYSICAL EXAM:  Exam: General: Well-developed, well-nourished Communication and Voice: Clear pitch and clarity Respiratory Respiratory effort: Equal inspiration and expiration without stridor Cardiovascular Peripheral Vascular: Warm extremities with equal color/perfusion Eyes: No nystagmus with equal extraocular motion bilaterally Neuro/Psych/Balance: Patient oriented to person, place, and time; Appropriate mood and affect; Gait is intact with no imbalance; Cranial nerves I-XII are intact Head and Face Inspection: Normocephalic and atraumatic without mass or lesion Palpation: Facial skeleton intact without bony stepoffs Salivary Glands: No mass or tenderness Facial Strength: Facial motility symmetric and full bilaterally ENT Pinna: External ear intact and fully developed External canal: Canal is patent with intact skin Tympanic Membrane: Clear and mobile External Nose: No scar or anatomic deformity Internal Nose: Septum is deviated to the left. No polyp, or purulence. Mucosal edema and erythema present.  Bilateral inferior turbinate hypertrophy.  Lips, Teeth, and gums: Mucosa and teeth intact and viable TMJ: No pain to palpation with full mobility Oral cavity/oropharynx: No erythema or exudate, no lesions present Nasopharynx: No mass or lesion with intact mucosa Hypopharynx: Intact mucosa without pooling of secretions Larynx Glottic: Full true vocal cord mobility without lesion or mass Supraglottic: Normal appearing epiglottis and AE folds Interarytenoid Space: Moderate pachydermia&edema Subglottic Space: Patent without lesion or edema Neck Neck and Trachea: Midline trachea without mass or lesion Thyroid : No mass or nodularity Lymphatics: No lymphadenopathy  Procedure: Preoperative diagnosis: chronic throat clearing  Postoperative diagnosis:   Same + GERD  LPR  Procedure: Flexible fiberoptic laryngoscopy  Surgeon: Elena Larry, MD  Anesthesia: Topical lidocaine  and Afrin Complications: None Condition is stable throughout exam  Indications and consent:  The patient presents to the clinic with above symptoms. Indirect laryngoscopy view was incomplete. Thus it was recommended that they undergo a flexible fiberoptic laryngoscopy. All of the risks, benefits, and potential complications were reviewed with the patient preoperatively and verbal informed consent was obtained.  Procedure: The patient was seated upright in the clinic. Topical lidocaine  and Afrin were applied to the nasal cavity. After adequate anesthesia had occurred, I then proceeded to pass the flexible telescope into the nasal cavity. The nasal cavity was patent without rhinorrhea or polyp. The nasopharynx was also patent without mass or lesion. The base of tongue was visualized and was normal. There were no signs of pooling of secretions in the piriform sinuses.  The true vocal folds were mobile bilaterally. There were no signs of glottic or supraglottic mucosal lesion or mass. There was moderate interarytenoid pachydermia and post cricoid edema. The telescope was then slowly withdrawn and the patient tolerated the procedure throughout.   Studies Reviewed: CT chest 11/25/23 Narrative & Impression  CLINICAL DATA:  Chronic, persistent cough greater than 8 weeks. Heaviness in chest. History of breast cancer with radiation. Left lumpectomy 2022 * Tracking Code: BO *   EXAM: CT CHEST WITHOUT CONTRAST   TECHNIQUE: Multidetector CT imaging of the chest was performed following the standard protocol without IV contrast.   RADIATION DOSE REDUCTION: This exam was performed according to the departmental dose-optimization program which includes automated exposure control, adjustment of the mA and/or kV according to patient size and/or use of iterative reconstruction technique.    COMPARISON:  CT Sep 11, 2020   FINDINGS: Cardiovascular: No significant vascular findings. Normal heart size. No pericardial effusion.   Mediastinum/Nodes: No pathologically enlarged mediastinal, hilar or axillary lymph nodes noting limited sensitivity of the hilar structures on noncontrast enhanced examination.   Left axillary surgical clips.   Moderate-sized hiatal hernia.   Lungs/Pleura: No suspicious pulmonary nodules or masses.   Mild biapical pleuroparenchymal scarring. Scattered atelectasis/scarring in the bilateral lower lobes, lingula and right middle lobe.   No pleural effusion.  No pneumothorax.   Upper Abdomen: No acute abnormality.   Musculoskeletal: No acute osseous abnormality. No aggressive lytic or blastic lesion of bone.   IMPRESSION: 1. No acute abnormality in the chest. 2. Moderate-sized hiatal hernia.    Assessment/Plan: Encounter Diagnoses  Name Primary?   Chronic GERD Yes   Chronic throat clearing    Chronic nasal congestion    Environmental and seasonal allergies    Post-nasal drip    Nasal septal deviation    Assessment and Plan Assessment & Plan Chronic throat clearing  Chronic throat clearing attributed to GERD and postnasal drip. Reflux irritation and postnasal drainage observed on scope exam. Current management includes omeprazole  for GERD and Ryaltris  for postnasal drip. No alarming findings on scope exam. - Recommend Reflux Gourmet supplement after meals, especially after dinner. - Gradually wean off omeprazole  by skipping evening doses, then alternate days over 4-5 weeks. - Consider nasal saline rinses once daily and continue current management of post-nasal drip  Allergic rhinitis with chronic nasal congestion/post-nasal drip Allergic rhinitis with nasal congestion, managed with over-the-counter Xyzal  and Ryaltris  nasal spray. Allergies include grasses, trees, and cats. - Continue Xyzal  and Ryaltris  nasal spray. - Rotate allergy   medications every 3-4 months. - Continue allergy  medication year-round due to cat allergy . - nasal saline rinses  Thank you for allowing me to participate in the care of this patient. Please do not hesitate to contact me with any questions or concerns.   Elena Larry, MD Otolaryngology [facility] Phone: [phone] Fax: [phone]    12/21/2023, 4:09 PM

## 2023-12-21 NOTE — Patient Instructions (Signed)
-   Take Reflux Gourmet (natural supplement available on Amazon) to help with symptoms of chronic throat irritation      GamingLesson.nl - check out this website to learn more about reflux   -Avoid lying down for at least two hours after a meal or after drinking acidic beverages, like soda, or other caffeinated beverages. This can help to prevent stomach contents from flowing back into the esophagus. -Keep your head elevated while you sleep. Using an extra pillow or two can also help to prevent reflux. -Eat smaller and more frequent meals each day instead of a few large meals. This promotes digestion and can aid in preventing heartburn. -Wear loose-fitting clothes to ease pressure on the stomach, which can worsen heartburn and reflux. -Reduce excess weight around the midsection. This can ease pressure on the stomach. Such pressure can force some stomach contents back up the esophagus

## 2023-12-22 ENCOUNTER — Encounter: Payer: Self-pay | Admitting: *Deleted

## 2023-12-23 ENCOUNTER — Encounter: Payer: Self-pay | Admitting: Hematology and Oncology

## 2023-12-31 ENCOUNTER — Ambulatory Visit (HOSPITAL_BASED_OUTPATIENT_CLINIC_OR_DEPARTMENT_OTHER): Admitting: Cardiology

## 2023-12-31 ENCOUNTER — Encounter: Payer: Self-pay | Admitting: Internal Medicine

## 2024-01-01 ENCOUNTER — Encounter: Payer: Self-pay | Admitting: Family Medicine

## 2024-01-01 ENCOUNTER — Encounter (INDEPENDENT_AMBULATORY_CARE_PROVIDER_SITE_OTHER): Payer: Self-pay | Admitting: Nurse Practitioner

## 2024-01-04 NOTE — Progress Notes (Unsigned)
 [address] [place],  [place], [place] [address]  Office: [phone]  /  Fax: [phone]   Subjective   Initial Visit  Valerie [name] (MR# [id number]) is a 53 y.o. female who presents for evaluation and treatment of obesity and related comorbidities. Current BMI is There is no height or weight on file to calculate BMI. Valerie [name] has been struggling with her weight for many years and has been unsuccessful in either losing weight, maintaining weight loss, or reaching her healthy weight goal.  Valerie [name] is currently in the action stage of change and ready to dedicate time achieving and maintaining a healthier weight. Valerie [name] is interested in becoming our patient and working on intensive lifestyle modifications including (but not limited to) diet and exercise for weight loss.  Weight history:  When asked how their weight has affected their life and health, she states: {EMWeightAffected:28305}  When asked what else they would like to accomplish? She states: {EMHopetoaccomplish:28304::Adopt a healthier eating pattern and lifestyle,Improve energy levels and physical activity,Improve existing medical conditions,Improve quality of life}  She starting to note weight gain during : {emstartedtogainweight:31588}.  Life events associated with weight gain include : {emlifeeventsweighgain:31589}.   Other contributing factors: {EMcontributingfactors:28307}.  Their highest weight has been:  *** lbs.  Desired weight: ***  Previous weight-loss programs : {emweightlossprograms:31590::None}.  Their maximum weight loss was:  *** lbs.  Their greatest challenge with dieting: {emgreatestchallengediet:31593}.  Current or previous pharmacotherapy: {EM previousRx:28311}.  Response to medication: {EMResponsetomedication:28312}   Nutritional History:  Current nutrition plan: {EMNutritionplan:28309::None}.  How many times do you eat outside the home: {emfrequency:31645}  How often do they skip  meals: {emskipmeals:31594::does not skip meals}  What beverages do they drink: {embeverages:31595}.   Use of artificial sweetners : {Yes/No:30480221}  Food intolerances or dislikes: {emfoodintolerance:31596::none}.  Food triggers: {emfoodtriggers:31600::None}.  Food cravings: {emfoodcravings:31601}  Do they struggle with excessive hunger or portion control : {YES/NO:21197}   Physical Activity:  Current level of physical activity: {EMcurrentPA:28310::None}  Barriers to Exercise: {embarrierstoexercise:32606::no barriers}   Past medical history includes:   Past Medical History:  Diagnosis Date   Abnormal echocardiogram 11/28/2021   EF 60 -65%, abnormal global longitudinal strain, see cardiology OV in Epic dated 12/25/21   Allergy  4/25   See notes from Dr Luke   Breast cancer [facility])    Cancer [facility]) 10/2020   left breast, ER+, s/p radiation therapy 12/25/20 - 01/18/21, Patient follows with Dr. Mackey Chad @ [facility].   COVID-19 11/2019   flu-like symptoms, cough, no hospitalizations   Edema    lower extremity, follows w/ [facility], Sherrilee Dinning, [place]. See OV dated 03/24/22 in Epic.   GERD (gastroesophageal reflux disease)    Follows w/ Dr. Rosario Guppy, gastroenterologist and PCP, Dr. Jenkins Jansky [facility] @ [facility].   Hashimoto's disease    Follows with Dr. Tawni Fendt @ [place].   History of hiatal hernia    2 cm hiatal hernia per 01/08/22 EGD   History of radiation therapy 2022   completed 01-18-2021   [facility] (hard of hearing)    has gotten bilateral aides.   Hypothyroidism    Follows w/ endocronologist, Dr. Tawni Fendt @ [place].   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 Charlie Crete @ [facility].   Optic neuritis    Follows w/ opthamology,  Dr. 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    Anthropometrics:  No data recorded No data recorded No data recorded No data recorded  Physical Exam:  General: She is overweight, cooperative, alert, well developed, and in no acute distress. PSYCH: Has normal mood, affect and thought process.   HEENT: EOMI, sclerae are anicteric. Lungs: Normal breathing effort, no conversational dyspnea. Extremities: No edema.  Neurologic: No gross sensory or motor deficits. No tremors or fasciculations noted.    Diagnostic Data Reviewed  EKG: Normal sinus rhythm, rate ***. No conduction abnormalities, abnormal Q waves or chamber enlargement.  Indirect Calorimeter completed today shows a VO2 of *** and a REE of ***.  Her calculated basal metabolic rate is *** thus her {zfprmzdlou:68397}.  Depression Screen  Valerie [name]'s PHQ-9 score was: ***.     03/09/2023    1:08 PM  Depression screen PHQ 2/9  Decreased Interest 0  Down, Depressed, Hopeless 1  PHQ - 2 Score 1  Altered sleeping 2  Tired, decreased energy 1  Change in appetite 1  Feeling bad or failure about yourself  0  Trouble concentrating 0  Moving slowly or fidgety/restless 0  Suicidal thoughts 0  PHQ-9 Score 5  Difficult doing work/chores Not difficult at all    Screening for Sleep Related Breathing Disorders  Valerie [name] {Actions; denies/reports/admits to:19208} daytime somnolence and {Actions; denies/reports/admits to:19208} waking up still tired. Patient has a history of symptoms of {OSA Sx:17850}. Valerie [name] generally gets {numbers (fuzzy):14653} hours of sleep per night, and states that she has {sleep  quality:17851}. Snoring {is/are not:32546} present. Apneic episodes {is/are not:32546} present. Epworth Sleepiness Score is ***.   BMET    Component Value Date/Time   NA 138 11/12/2023 0758   NA 142 05/06/2023 1638   K 4.5 11/12/2023 0758   CL 105 11/12/2023 0758   CO2 29 11/12/2023 0758   GLUCOSE 96 11/12/2023 0758   BUN 18 11/12/2023 0758   BUN 24 05/06/2023 1638   CREATININE 0.99 11/12/2023 0758   CREATININE 0.97 08/21/2023 1613   CALCIUM 8.6 11/12/2023 0758   GFRNONAA >60 08/29/2022 0945   GFRNONAA >60 11/08/2021 1016   Lab Results  Component Value Date   HGBA1C 5.5 08/21/2023   HGBA1C 5.4 04/25/2020   No results found for: INSULIN  CBC    Component Value Date/Time   WBC 5.7 11/27/2023 0829   RBC 4.77 11/27/2023 0829   HGB 15.3 (H) 11/27/2023 0829   HGB 14.9 05/06/2023 1638   HCT 45.8 11/27/2023 0829   HCT 45.1 05/06/2023 1638   PLT 193.0 11/27/2023 0829   PLT 213 05/06/2023 1638   MCV 96.2 11/27/2023 0829   MCV 97 05/06/2023 1638   MCH 31.8 08/21/2023 1613   MCHC 33.3 11/27/2023 0829   RDW 12.8 11/27/2023 0829   RDW 11.3 (L) 05/06/2023 1638   Iron/TIBC/Ferritin/ %Sat No results found for: IRON, TIBC, FERRITIN, IRONPCTSAT Lipid Panel     Component Value Date/Time   CHOL 188 08/21/2023 1613   CHOL 178 10/22/2022 0851   TRIG 114 08/21/2023 1613   HDL 56 08/21/2023 1613  HDL 56 10/22/2022 0851   CHOLHDL 3.4 08/21/2023 1613   VLDL 25.8 08/19/2022 1604   LDLCALC 110 (H) 08/21/2023 1613   Hepatic Function Panel     Component Value Date/Time   PROT 6.7 11/12/2023 0758   PROT 6.7 05/06/2023 1638   ALBUMIN 4.0 11/12/2023 0758   ALBUMIN 4.2 05/06/2023 1638   AST 22 11/12/2023 0758   AST 20 11/08/2021 1016   ALT 29 11/12/2023 0758   ALT 26 11/08/2021 1016   ALKPHOS 33 (L) 11/12/2023 0758   BILITOT 0.5 11/12/2023 0758   BILITOT 0.2 05/06/2023 1638   BILITOT 0.4 11/08/2021 1016   BILIDIR 0.11 06/11/2021 0806      Component Value Date/Time    TSH 2.46 11/12/2023 0758     Assessment and Plan   TREATMENT PLAN FOR OBESITY:  Recommended Dietary Goals  Valerie [name] is currently in the action stage of change. As such, her goal is to implement medically supervised obesity management plan.  She has agreed to implement: {emwtlossplannewint:31639}  Behavioral Intervention  We discussed the following Behavioral Modification Strategies today: {EMwtlossstrategiesnewint:31640::increasing lean protein intake to established goals,decreasing simple carbohydrates ,increasing vegetables,increasing lower glycemic fruits,increasing fiber rich foods,avoiding skipping meals,increasing water intake,work on meal planning and preparation,reading food labels ,keeping healthy foods at home,identifying sources and decreasing liquid calories,decreasing eating out or consumption of processed foods, and making healthy choices when eating convenient foods,planning for success,better snacking choices}  Additional resources provided today: {emadditionalresourcesnewint:32116::Handout on healthy eating and balanced plate,Handout on complex carbohydrates and lean sources of protein,Handout principles of weight management}  Recommended Physical Activity Goals  [place] has been advised to work up to 150 minutes of moderate intensity aerobic activity a week and strengthening exercises 2-3 times per week for cardiovascular health, weight loss maintenance and preservation of muscle mass.   She has agreed to :  {EMEXERCISE:28847::Think about enjoyable ways to increase daily physical activity and overcoming barriers to exercise,Increase physical activity in their day and reduce sedentary time (increase NEAT).,Increase volume of physical activity to a goal of 240 minutes a week,Combine aerobic and strengthening exercises for efficiency and improved cardiometabolic health.}  Medical Interventions and Pharmacotherapy We will work on building  a Therapist, art and behavioral strategies. We will discuss the role of pharmacotherapy as an adjunct at subsequent visits.   ASSOCIATED CONDITIONS ADDRESSED TODAY  Other Fatigue ***. Valerie [name] does feel that her weight is causing her energy to be lower than it should be. Fatigue may be related to obesity, depression or many other causes. Labs will be ordered, and in the meanwhile, Valerie [name] will focus on self care including making healthy food choices, increasing physical activity and focusing on stress reduction.  Shortness of Breath Valerie [name] notes increasing shortness of breath with physical activity and seems to be worsening over time with weight gain. She notes getting out of breath sooner with activity than she used to. This has not gotten worse recently. Valerie [name] denies shortness of breath at rest or orthopnea.  There are no diagnoses linked to this encounter.  Follow-up  She was informed of the importance of frequent follow-up visits to maximize her success with intensive lifestyle modifications for her multiple health conditions. She was informed we would discuss her lab results at her next visit unless there is a critical issue that needs to be addressed sooner. Valerie [name] agreed to keep her next visit at the agreed upon time to discuss these results.  Attestation Statement  This is the patient's intake visit at [facility]. The patient's Health Questionnaire was reviewed at length. Included in the packet: current and past health history, medications, allergies, ROS, gynecologic history (women only), surgical history, family history, social history, weight history, weight loss surgery history (for those that have had weight loss surgery), nutritional evaluation, mood and food questionnaire, PHQ9, Epworth questionnaire, sleep habits questionnaire, patient life and health improvement goals questionnaire. These will all be scanned into the patient's chart under media.    During the visit, I independently reviewed the patient's EKG***, previous labs, bioimpedance scale results, and indirect calorimetry results. I used this information to medically tailor a meal plan for the patient that will help her to lose weight and will improve her obesity-related conditions. I performed a medically necessary appropriate examination and/or evaluation. I discussed the assessment and treatment plan with the patient. The patient was provided an opportunity to ask questions and all were answered. The patient agreed with the plan and demonstrated an understanding of the instructions. Labs were ordered at this visit and will be reviewed at the next visit unless critical results need to be addressed immediately. Clinical information was updated and documented in the EMR.   In addition, they received basic education on identification of processed foods and reduction of these, different sources of lean proteins and complex carbohydrates and how to eat balanced by incorporation of whole foods.  Reviewed by clinician on day of visit: allergies, medications, problem list, medical history, surgical history, family history, social history, and previous encounter notes.  I have spent *** minutes in the care of the patient today including: {NUMBER 1-10:22536} minutes before the visit reviewing and preparing the chart. *** minutes face-to-face {emfacetoface:32598::assessing and reviewing listed medical problems as outlined in obesity care plan,providing nutritional and behavioral counseling on topics outlined in the obesity care plan,independently interpreting test results and goals of care, as described in assessment and plan,reviewing and discussing biometric information and progress} {NUMBER 1-10:22536} minutes after the visit updating chart and documentation of encounter.       Takoya Jonas ANP-C

## 2024-01-05 ENCOUNTER — Ambulatory Visit (INDEPENDENT_AMBULATORY_CARE_PROVIDER_SITE_OTHER): Admitting: Nurse Practitioner

## 2024-01-05 ENCOUNTER — Other Ambulatory Visit

## 2024-01-05 ENCOUNTER — Encounter (INDEPENDENT_AMBULATORY_CARE_PROVIDER_SITE_OTHER): Payer: Self-pay | Admitting: Nurse Practitioner

## 2024-01-05 VITALS — BP 111/76 | HR 73 | Temp 98.2°F | Ht 65.0 in | Wt 188.0 lb

## 2024-01-05 DIAGNOSIS — Z1331 Encounter for screening for depression: Secondary | ICD-10-CM

## 2024-01-05 DIAGNOSIS — Z6831 Body mass index (BMI) 31.0-31.9, adult: Secondary | ICD-10-CM

## 2024-01-05 DIAGNOSIS — R5383 Other fatigue: Secondary | ICD-10-CM

## 2024-01-05 DIAGNOSIS — R0602 Shortness of breath: Secondary | ICD-10-CM | POA: Insufficient documentation

## 2024-01-05 DIAGNOSIS — Z170 Estrogen receptor positive status [ER+]: Secondary | ICD-10-CM

## 2024-01-05 DIAGNOSIS — E063 Autoimmune thyroiditis: Secondary | ICD-10-CM | POA: Diagnosis not present

## 2024-01-05 DIAGNOSIS — C50312 Malignant neoplasm of lower-inner quadrant of left female breast: Secondary | ICD-10-CM

## 2024-01-05 DIAGNOSIS — E66811 Obesity, class 1: Secondary | ICD-10-CM

## 2024-01-05 DIAGNOSIS — E7800 Pure hypercholesterolemia, unspecified: Secondary | ICD-10-CM

## 2024-01-05 DIAGNOSIS — E559 Vitamin D deficiency, unspecified: Secondary | ICD-10-CM

## 2024-01-07 ENCOUNTER — Ambulatory Visit (INDEPENDENT_AMBULATORY_CARE_PROVIDER_SITE_OTHER): Payer: Self-pay | Admitting: Nurse Practitioner

## 2024-01-09 LAB — COMPREHENSIVE METABOLIC PANEL WITH GFR
ALT: 36 IU/L — ABNORMAL HIGH (ref 0–32)
AST: 28 IU/L (ref 0–40)
Albumin: 4.5 g/dL (ref 3.8–4.9)
Alkaline Phosphatase: 48 IU/L — ABNORMAL LOW (ref 49–135)
BUN/Creatinine Ratio: 18 (ref 9–23)
BUN: 18 mg/dL (ref 6–24)
Bilirubin Total: 0.4 mg/dL (ref 0.0–1.2)
CO2: 20 mmol/L (ref 20–29)
Calcium: 9 mg/dL (ref 8.7–10.2)
Creatinine, Ser: 1.02 mg/dL — ABNORMAL HIGH (ref 0.57–1.00)
Globulin, Total: 2.6 g/dL (ref 1.5–4.5)
Glucose: 84 mg/dL (ref 70–99)
Total Protein: 7.1 g/dL (ref 6.0–8.5)
eGFR: 65 mL/min/1.73 (ref >59)

## 2024-01-09 LAB — VITAMIN B12: Vitamin B-12: 927 pg/mL (ref 232–1245)

## 2024-01-09 LAB — CBC WITH DIFFERENTIAL/PLATELET
Basophils Absolute: 0 x10E3/uL (ref 0.0–0.2)
Basos: 0 %
EOS (ABSOLUTE): 0.3 x10E3/uL (ref 0.0–0.4)
Eos: 5 %
Hematocrit: 49.6 % — ABNORMAL HIGH (ref 34.0–46.6)
Hemoglobin: 16.5 g/dL — ABNORMAL HIGH (ref 11.1–15.9)
Immature Grans (Abs): 0 x10E3/uL (ref 0.0–0.1)
Immature Granulocytes: 0 %
Lymphocytes Absolute: 1.5 x10E3/uL (ref 0.7–3.1)
Lymphs: 28 %
MCH: 32.9 pg (ref 26.6–33.0)
MCHC: 33.3 g/dL (ref 31.5–35.7)
MCV: 99 fL — ABNORMAL HIGH (ref 79–97)
Monocytes Absolute: 0.7 x10E3/uL (ref 0.1–0.9)
Monocytes: 12 %
Neutrophils Absolute: 3.1 x10E3/uL (ref 1.4–7.0)
Neutrophils: 55 %
Platelets: 205 x10E3/uL (ref 150–450)
RBC: 5.01 x10E6/uL (ref 3.77–5.28)
RDW: 11.8 % (ref 11.7–15.4)
WBC: 5.6 x10E3/uL (ref 3.4–10.8)

## 2024-01-09 LAB — HEMOGLOBIN A1C
Est. average glucose Bld gHb Est-mCnc: 111 mg/dL
Hgb A1c MFr Bld: 5.5 % (ref 4.8–5.6)

## 2024-01-09 LAB — VITAMIN D 25 HYDROXY (VIT D DEFICIENCY, FRACTURES): Vit D, 25-Hydroxy: 63 ng/mL (ref 30.0–100.0)

## 2024-01-09 LAB — INSULIN, RANDOM: INSULIN: 13.2 u[IU]/mL (ref 2.6–24.9)

## 2024-01-09 LAB — TSH: TSH: 0.75 u[IU]/mL (ref 0.450–4.500)

## 2024-01-09 LAB — MAGNESIUM: Magnesium: 2.1 mg/dL (ref 1.6–2.3)

## 2024-01-19 ENCOUNTER — Ambulatory Visit (INDEPENDENT_AMBULATORY_CARE_PROVIDER_SITE_OTHER): Admitting: Nurse Practitioner

## 2024-01-19 ENCOUNTER — Encounter (INDEPENDENT_AMBULATORY_CARE_PROVIDER_SITE_OTHER): Payer: Self-pay | Admitting: Nurse Practitioner

## 2024-01-19 VITALS — BP 104/71 | HR 69 | Temp 98.3°F | Ht 65.0 in | Wt 183.0 lb

## 2024-01-19 DIAGNOSIS — E88819 Insulin resistance, unspecified: Secondary | ICD-10-CM

## 2024-01-19 DIAGNOSIS — E66811 Obesity, class 1: Secondary | ICD-10-CM

## 2024-01-19 DIAGNOSIS — E7800 Pure hypercholesterolemia, unspecified: Secondary | ICD-10-CM | POA: Diagnosis not present

## 2024-01-19 DIAGNOSIS — E559 Vitamin D deficiency, unspecified: Secondary | ICD-10-CM | POA: Diagnosis not present

## 2024-01-19 DIAGNOSIS — E063 Autoimmune thyroiditis: Secondary | ICD-10-CM

## 2024-01-19 DIAGNOSIS — Z6830 Body mass index (BMI) 30.0-30.9, adult: Secondary | ICD-10-CM

## 2024-01-19 NOTE — Progress Notes (Signed)
 Office: [phone]  /  Fax: [phone]  WEIGHT SUMMARY AND BIOMETRICS  Weight Lost Since Last Visit: 5 lb  Weight Gained Since Last Visit: 0   Vitals Temp: 98.3 F (36.8 C) BP: 104/71 Pulse Rate: 69 SpO2: 97 %   Anthropometric Measurements Height: 5' 5 (1.651 m) Weight: 183 lb (83 kg) BMI (Calculated): 30.45 Weight at Last Visit: 188 lb Weight Lost Since Last Visit: 5 lb Weight Gained Since Last Visit: 0 Total Weight Loss (lbs): 5 lb (2.268 kg) Peak Weight: 195 lb   Body Composition  Body Fat %: 40.2 % Fat Mass (lbs): 73.8 lbs Muscle Mass (lbs): 104.4 lbs Total Body Water (lbs): 72.8 lbs Visceral Fat Rating : 10   Other Clinical Data Fasting: no Labs: no Today's Visit #: 2 Starting Date: 01/05/24    Total Weight Loss: 5 pounds  Bio Impedance Data reviewed with patient: Down 1.8 pound muscle, Down 2.6 pound adipose  HPI  Chief Complaint: OBESITY  Valerie [name] is here to discuss her progress with her obesity treatment plan. She is on the the Category 2 Plan and states she is following her eating plan approximately 90 % of the time. She states she is exercising 45 minutes 5 days per week.   Interval History:  Since last office visit she has been following between 1100-1200 calories daily- could not do 1000 calories. She is getting 100 grams of protein daily. She has been trying to get 64 ounces of water daily. She has been getting fruits and vegetables. She was in [place]  for 4 days. She did go on 45 minute 2 mile walks while on vacation. She will normally go to the gym 5 days a week M-W-F 15 minutes weights and 30 minutes on elliptical bike. T-TH walks laps on the track 15 -20 minutes and elliptical bike 30 minutes.    Cancer Screenings Pap: 02/27/22 ASCUS with negative HPV Mammogram: 09/17/23 negative Colonoscopy:09/06/21 due 2028  Coronary Artery Calcium Score CT 07/13/20- score 0 with no CAD noted  PHYSICAL EXAM:  Blood pressure 104/71, pulse 69,  temperature 98.3 F (36.8 C), height 5' 5 (1.651 m), weight 183 lb (83 kg), last menstrual period 12/23/2021, SpO2 97%. Body mass index is 30.45 kg/m.  General: Well Developed, well nourished, and in no acute distress.  HEENT: Normocephalic, atraumatic; EOMI, sclerae are anicteric. Skin: Warm and dry, good turgor Chest:  Normal excursion, shape, no gross ABN Respiratory: No conversational dyspnea; speaking in full sentences NeuroM-Sk:  Normal gross ROM * 4 extremities  Psych: A and O X 3, insight adequate, mood- full    DIAGNOSTIC DATA REVIEWED:  Labs reviewed with patient in detail. Mildly elevated liver function ALT with low Alk Phos. Mildly elevated hemoglobin and hematocrit- pt is a nonsmoker. Elevated fasting insulin  and HOMA-IR score of 2.73 indicating insulin  resistance. Elevated LDL indicating pure hypercholesterolemia- done 08/2023. Normal kidney functions, thyroid , A1c, Vit D and Vit B12  Last metabolic panel  Lab Results  Component Value Date   GLUCOSE 84 01/05/2024   NA CANCELED 01/05/2024   K CANCELED 01/05/2024   CL CANCELED 01/05/2024   CO2 20 01/05/2024   BUN 18 01/05/2024   CREATININE 1.02 (H) 01/05/2024   EGFR 65 01/05/2024   CALCIUM 9.0 01/05/2024   PROT 7.1 01/05/2024   ALBUMIN 4.5 01/05/2024   LABGLOB 2.6 01/05/2024   AGRATIO 1.8 10/21/2021   BILITOT 0.4 01/05/2024   ALKPHOS 48 (L) 01/05/2024   AST 28 01/05/2024   ALT 36 (H) 01/05/2024  ANIONGAP 5 08/29/2022     Lab Results  Component Value Date   HGBA1C 5.5 01/05/2024   HGBA1C 5.4 04/25/2020   Lab Results  Component Value Date   INSULIN  13.2 01/05/2024   Lab Results  Component Value Date   TSH 0.750 01/05/2024   CBC    Component Value Date/Time   WBC 5.6 01/05/2024 0947   WBC 5.7 11/27/2023 0829   RBC 5.01 01/05/2024 0947   RBC 4.77 11/27/2023 0829   HGB 16.5 (H) 01/05/2024 0947   HCT 49.6 (H) 01/05/2024 0947   PLT 205 01/05/2024 0947   MCV 99 (H) 01/05/2024 0947   MCH 32.9  01/05/2024 0947   MCH 31.8 08/21/2023 1613   MCHC 33.3 01/05/2024 0947   MCHC 33.3 11/27/2023 0829   RDW 11.8 01/05/2024 0947   Lipid Panel     Component Value Date/Time   CHOL 188 08/21/2023 1613   CHOL 178 10/22/2022 0851   TRIG 114 08/21/2023 1613   HDL 56 08/21/2023 1613   HDL 56 10/22/2022 0851   CHOLHDL 3.4 08/21/2023 1613   VLDL 25.8 08/19/2022 1604   LDLCALC 110 (H) 08/21/2023 1613    Nutritional Lab Results  Component Value Date   VD25OH 63.0 01/05/2024   VD25OH 72.1 10/22/2022   VD25OH 34.5 05/07/2020   Lab Results  Component Value Date   VITAMINB12 927 01/05/2024     ASSESSMENT AND PLAN   Class 1 obesity with serious comorbidity and body mass index (BMI) of 30.0 to 30.9 in adult, unspecified obesity type TREATMENT PLAN FOR OBESITY:  Recommended Dietary Goals  Valerie [name] is currently in the action stage of change. As such, her goal is to continue weight management plan. She has agreed to the Category 2 Plan.  Behavioral Intervention  We discussed the following Behavioral Modification Strategies today: staying on track while traveling and vacationing, continue to work on maintaining a reduced calorie state, getting the recommended amount of protein, incorporating whole foods, making healthy choices, staying well hydrated and practicing mindfulness when eating., and increase protein intake, fibrous foods (25 grams per day for women, 30 grams for men) and water to improve satiety and decrease hunger signals. .  Additional resources provided today: Reviewed skinnytaste recipes with patient  Recommended Physical Activity Goals  Valerie [name] has been advised to work up to 150 minutes of moderate intensity aerobic activity a week and strengthening exercises 2-3 times per week for cardiovascular health, weight loss maintenance and preservation of muscle mass.   She has agreed to Think about enjoyable ways to increase daily physical activity and overcoming barriers to  exercise, Increase physical activity in their day and reduce sedentary time (increase NEAT)., Increase volume of physical activity to a goal of 240 minutes a week, and Combine aerobic and strengthening exercises for efficiency and improved cardiometabolic health.   Pharmacotherapy We discussed various medication options to help Valerie [name] with her weight loss efforts and we both agreed to continue nutrition and behavior modification.  ASSOCIATED CONDITIONS ADDRESSED TODAY  Action/Plan  Pure hypercholesterolemia Focus on implementing category 2 meal plan, limit saturated fats Loss of 10-15% body weight can improve lipid levels Focus on getting 240 minutes a week of moderate to high intensity exercise   Hypothyroidism due to Hashimoto's thyroiditis       Continue Levothyroxine  88 mcg every day, TSH was in normal range  Vitamin D  deficiency       Continue cholecalciferol  1000 units daily- Vit D level was in normal range  Insulin  resistance Continue Category 2  meal plan, limit simple carbohydrates Decreasing body weight by 10-15% can improve glucose levels Continue exercise with current goal of 240 minutes of moderate to high intensity exercise/week.           Return in about 2 weeks (around 02/02/2024).Valerie [name] She was informed of the importance of frequent follow up visits to maximize her success with intensive lifestyle modifications for her multiple health conditions.   ATTESTASTION STATEMENTS:  Reviewed by clinician on day of visit: allergies, medications, problem list, medical history, surgical history, family history, social history, and previous encounter notes.   I personally spent a total of 62 minutes in the care of the patient today including preparing to see the patient, getting/reviewing separately obtained history, performing a medically appropriate exam/evaluation, counseling and educating, documenting clinical information in the EHR, independently interpreting results,  communicating results, and coordinating care.   Moselle Rister ANP-C

## 2024-02-04 ENCOUNTER — Ambulatory Visit (INDEPENDENT_AMBULATORY_CARE_PROVIDER_SITE_OTHER): Admitting: Family Medicine

## 2024-02-04 ENCOUNTER — Encounter (INDEPENDENT_AMBULATORY_CARE_PROVIDER_SITE_OTHER): Payer: Self-pay | Admitting: Family Medicine

## 2024-02-04 VITALS — BP 109/74 | HR 78 | Temp 98.0°F | Ht 65.0 in | Wt 181.0 lb

## 2024-02-04 DIAGNOSIS — E7800 Pure hypercholesterolemia, unspecified: Secondary | ICD-10-CM | POA: Diagnosis not present

## 2024-02-04 DIAGNOSIS — E66811 Obesity, class 1: Secondary | ICD-10-CM

## 2024-02-04 DIAGNOSIS — E88819 Insulin resistance, unspecified: Secondary | ICD-10-CM

## 2024-02-04 DIAGNOSIS — E559 Vitamin D deficiency, unspecified: Secondary | ICD-10-CM

## 2024-02-04 DIAGNOSIS — Z6831 Body mass index (BMI) 31.0-31.9, adult: Secondary | ICD-10-CM

## 2024-02-04 DIAGNOSIS — Z6830 Body mass index (BMI) 30.0-30.9, adult: Secondary | ICD-10-CM

## 2024-02-04 NOTE — Progress Notes (Signed)
 Valerie [name], D.O.  ABFM, ABOM Specializing in Clinical Bariatric Medicine  Office located at: [address] [place], [place]  [address]      A) FOR THE CHRONIC DISEASE OF OBESITY:  Chief complaint: Obesity Valerie [name] is here to discuss her progress with her obesity treatment plan.   History of present illness / Interval history:  Valerie [name] is here today for her follow-up office visit.  Since last OV on 01/19/24, pt is down 2 lbs. Pt states that she is shocked at all the calories that are in food. She is tracking her calories with Chronometer and tracking as she eats the food.  She endorses that she is more aware with what she is cooking with.     01/19/24 14:00 02/04/24 15:00   Body Fat % 40.2 % 40.6 %  Muscle Mass (lbs) 104.4 lbs 102.2 lbs  Fat Mass (lbs) 73.8 lbs 73.6 lbs  Total Body Water (lbs) 72.8 lbs 73.6 lbs  Visceral Fat Rating  10 10   Counseling done on how various foods will affect these numbers and how to maximize success   Total lbs lost to date: - 7 lbs Total Fat Mass in lbs lost to date: - 2.8 lbs Total weight loss percentage to date: - 3.72 %    Obesity (BMI) of 31.0 to 31.9 in adult, start 31.28 Class 1 obesity with serious comorbidity and body mass index (BMI) of 30.0 to 30.9 in adult, unspecified obesity type current 30.12 Nutrition Therapy She is journaling 1100-1200 calories with 90+ g protein and states she is following her eating plan.   - Tracking Calories/Macros: yes  - Eating More Whole Foods: yes  - Adequate Protein Intake: yes  - Adequate Water Intake: yes  - Skipping Meals: no  - Sleeping 7-9 Hours/ Night: yes   Valerie [name] is currently in the action stage of change. As such, her goal is to continue weight management plan.  She has agreed to: continue current plan   Physical Activity Valerie [name] is walking, biking and doing weights 45  minutes 5 days per week   Valerie [name] has been advised to work up to 300-450 minutes of  moderate intensity aerobic activity a week and strengthening exercises 2-3 times per week for cardiovascular health, weight loss maintenance and preservation of muscle mass.  She has agreed to : Think about enjoyable ways to increase daily physical activity and overcoming barriers to exercise, Increase physical activity in their day and reduce sedentary time (increase NEAT)., Increase volume of physical activity to a goal of 240 minutes a week, and Combine aerobic and strengthening exercises for efficiency and improved cardiometabolic health.   Behavioral Modifications Evidence-based interventions for health behavior change were utilized today including the discussion of  1) self monitoring techniques:    - Focus on eating higher protein foods  2) problem-solving barriers:    - Explore healthy snacks like cheese sticks  3) SMART goals for next OV:    - Journal every day and at the end of the day write down calories and protein   Regarding patient's less desirable eating habits and patterns, we employed the technique of small changes.   We discussed the following today: increasing lean protein intake to established goals, work on tracking and journaling calories using tracking application, and focusing on food with a 10:1 ratio of calories: grams of protein Additional resources provided today: Handout on Healthy [employer] , Handout on Daily Food Journaling Log, and  Provided patient with personalized instruction given on how to measure portions and weigh foods for accurate calculation of calories   Medical Interventions/ Pharmacotherapy Previous Bariatric surgery: n/a Pharmacotherapy for weight loss: She is not currently taking medications  for medical weight loss.    We discussed various medication options to help Valerie [name] with her weight loss efforts and we both agreed to : Continue with current nutritional and behavioral strategies   B) OBESITY RELATED CONDITIONS ADDRESSED  TODAY:  Insulin  resistance Assessment & Plan Lab Results  Component Value Date   HGBA1C 5.5 01/05/2024   HGBA1C 5.5 08/21/2023   HGBA1C 5.4 08/19/2022   INSULIN  13.2 01/05/2024    Diet and lifestyle controlled. Pt states that her hunger and cravings have not been as bad as they were in the beginning. Encouraged pt to track her protein intake to make sure she is getting in enough protein. Explained to pt the correlation between simple carbs and insulin  and how that increases cravings. Continue following nutritional meal plan and decreasing simple carbs and sugars.    Vitamin D  deficiency Assessment & Plan Lab Results  Component Value Date   VD25OH 63.0 01/05/2024   VD25OH 72.1 10/22/2022   VD25OH 34.5 05/07/2020   Taking Vit D3 5000 units daily. Good compliance and tolerance. Pts labs show that Vit D levels are at goal. Continue with supplementation. Will obtain labs in the near future.     Pure hypercholesterolemia Assessment & Plan Lab Results  Component Value Date   CHOL 188 08/21/2023   HDL 56 08/21/2023   LDLCALC 110 (H) 08/21/2023   TRIG 114 08/21/2023   CHOLHDL 3.4 08/21/2023    Diet and life style controlled. Pt is not taking any medications. No acute concerns today. Continue following nutritional meal plan and decreasing foods high in fat trans and saturated fats. Increase exercise as tolerated.       Follow up:   Return 02/18/2024 at 3:00 PM  She was informed of the importance of frequent follow up visits to maximize her success with intensive lifestyle modifications for her multiple health conditions.   Weight Summary and Biometrics   Weight Lost Since Last Visit: 2lb  Weight Gained Since Last Visit: 0    Vitals Temp: 98 F (36.7 C) BP: 109/74 Pulse Rate: 78 SpO2: 96 %   Anthropometric Measurements Height: 5' 5 (1.651 m) Weight: 181 lb (82.1 kg) BMI (Calculated): 30.12 Weight at Last Visit: 183lb Weight Lost Since Last Visit: 2lb Weight  Gained Since Last Visit: 0 Starting Weight: 188lb Total Weight Loss (lbs): 7 lb (3.175 kg) Peak Weight: 195lb   Body Composition  Body Fat %: 40.6 % Fat Mass (lbs): 73.6 lbs Muscle Mass (lbs): 102.2 lbs Total Body Water (lbs): 73.6 lbs Visceral Fat Rating : 10   Other Clinical Data Fasting: no Labs: no Today's Visit #: 3 Starting Date: 01/05/24    Objective:   PHYSICAL EXAM: Blood pressure 109/74, pulse 78, temperature 98 F (36.7 C), height 5' 5 (1.651 m), weight 181 lb (82.1 kg), last menstrual period 12/23/2021, SpO2 96%. Body mass index is 30.12 kg/m.  General: she is overweight, cooperative and in no acute distress. PSYCH: Has normal mood, affect and thought process.   HEENT: EOMI, sclerae are anicteric. Lungs: Normal breathing effort, no conversational dyspnea. Extremities: Moves * 4 Neurologic: A and O * 3, good insight  DIAGNOSTIC DATA REVIEWED: BMET    Component Value Date/Time   NA CANCELED 01/05/2024 0947   K CANCELED  01/05/2024 0947   CL CANCELED 01/05/2024 0947   CO2 20 01/05/2024 0947   GLUCOSE 84 01/05/2024 0947   GLUCOSE 96 11/12/2023 0758   BUN 18 01/05/2024 0947   CREATININE 1.02 (H) 01/05/2024 0947   CREATININE 0.97 08/21/2023 1613   CALCIUM 9.0 01/05/2024 0947   GFRNONAA >60 08/29/2022 0945   GFRNONAA >60 11/08/2021 1016   Lab Results  Component Value Date   HGBA1C 5.5 01/05/2024   HGBA1C 5.4 04/25/2020   Lab Results  Component Value Date   INSULIN  13.2 01/05/2024   Lab Results  Component Value Date   TSH 0.750 01/05/2024   CBC    Component Value Date/Time   WBC 5.6 01/05/2024 0947   WBC 5.7 11/27/2023 0829   RBC 5.01 01/05/2024 0947   RBC 4.77 11/27/2023 0829   HGB 16.5 (H) 01/05/2024 0947   HCT 49.6 (H) 01/05/2024 0947   PLT 205 01/05/2024 0947   MCV 99 (H) 01/05/2024 0947   MCH 32.9 01/05/2024 0947   MCH 31.8 08/21/2023 1613   MCHC 33.3 01/05/2024 0947   MCHC 33.3 11/27/2023 0829   RDW 11.8 01/05/2024 0947    Iron Studies No results found for: IRON, TIBC, FERRITIN, IRONPCTSAT Lipid Panel     Component Value Date/Time   CHOL 188 08/21/2023 1613   CHOL 178 10/22/2022 0851   TRIG 114 08/21/2023 1613   HDL 56 08/21/2023 1613   HDL 56 10/22/2022 0851   CHOLHDL 3.4 08/21/2023 1613   VLDL 25.8 08/19/2022 1604   LDLCALC 110 (H) 08/21/2023 1613   Hepatic Function Panel     Component Value Date/Time   PROT 7.1 01/05/2024 0947   ALBUMIN 4.5 01/05/2024 0947   AST 28 01/05/2024 0947   AST 20 11/08/2021 1016   ALT 36 (H) 01/05/2024 0947   ALT 26 11/08/2021 1016   ALKPHOS 48 (L) 01/05/2024 0947   BILITOT 0.4 01/05/2024 0947   BILITOT 0.4 11/08/2021 1016   BILIDIR 0.11 06/11/2021 0806      Component Value Date/Time   TSH 0.750 01/05/2024 0947   Nutritional Lab Results  Component Value Date   VD25OH 63.0 01/05/2024   VD25OH 72.1 10/22/2022   VD25OH 34.5 05/07/2020    Attestations:   Valerie [name], acting as a stage manager for Valerie Jenkins, DO., have compiled all relevant documentation for today's office visit on behalf of Valerie Jenkins, DO, while in the presence of [employer], DO.   I have reviewed the above documentation for accuracy and completeness, and I agree with the above. Valerie [name], D.O.  The 21st Century Cures Act was signed into law in 2016 which includes the topic of electronic health records.  This provides immediate access to information in MyChart.  This includes consultation notes, operative notes, office notes, lab results and pathology reports.  If you have any questions about what you read please let us  know at your next visit so we can discuss your concerns and take corrective action if need be.  We are right here with you.

## 2024-02-15 ENCOUNTER — Encounter: Payer: Self-pay | Admitting: Radiology

## 2024-02-18 ENCOUNTER — Encounter (INDEPENDENT_AMBULATORY_CARE_PROVIDER_SITE_OTHER): Payer: Self-pay | Admitting: Nurse Practitioner

## 2024-02-18 ENCOUNTER — Encounter (INDEPENDENT_AMBULATORY_CARE_PROVIDER_SITE_OTHER): Payer: Self-pay

## 2024-02-18 ENCOUNTER — Ambulatory Visit (INDEPENDENT_AMBULATORY_CARE_PROVIDER_SITE_OTHER): Payer: Self-pay | Admitting: Nurse Practitioner

## 2024-02-18 VITALS — BP 110/73 | HR 65 | Temp 98.7°F | Ht 65.0 in | Wt 178.0 lb

## 2024-02-18 DIAGNOSIS — E559 Vitamin D deficiency, unspecified: Secondary | ICD-10-CM | POA: Diagnosis not present

## 2024-02-18 DIAGNOSIS — E88819 Insulin resistance, unspecified: Secondary | ICD-10-CM | POA: Diagnosis not present

## 2024-02-18 DIAGNOSIS — Z6830 Body mass index (BMI) 30.0-30.9, adult: Secondary | ICD-10-CM

## 2024-02-18 DIAGNOSIS — E7800 Pure hypercholesterolemia, unspecified: Secondary | ICD-10-CM

## 2024-02-18 DIAGNOSIS — E66811 Obesity, class 1: Secondary | ICD-10-CM | POA: Diagnosis not present

## 2024-02-18 NOTE — Progress Notes (Signed)
 Office: [phone]  /  Fax: [phone]  WEIGHT SUMMARY AND BIOMETRICS  Weight Lost Since Last Visit: 3 lb  Weight Gained Since Last Visit: 0   Vitals Temp: 98.7 F (37.1 C) BP: 110/73 Pulse Rate: 65 SpO2: 98 %   Anthropometric Measurements Height: 5' 5 (1.651 m) Weight: 178 lb (80.7 kg) BMI (Calculated): 29.62 Weight at Last Visit: 181 lb Weight Lost Since Last Visit: 3 lb Weight Gained Since Last Visit: 0 Starting Weight: 188 lb Total Weight Loss (lbs): 10 lb (4.536 kg) Peak Weight: 195 lb   Body Composition  Body Fat %: 39.3 % Fat Mass (lbs): 70.2 lbs Muscle Mass (lbs): 103 lbs Total Body Water (lbs): 72.8 lbs Visceral Fat Rating : 9   Other Clinical Data Fasting: no Labs: no Today's Visit #: 4 Starting Date: 01/05/24    Total Weight Loss: 10 pounds Percent of body weight lost:5.3%   Bio Impedance Data reviewed with patient: Muscle is up 0.8 pounds, adipose is down 3.4 pounds. PBF decreased from 40.6% to 39.3% . Visceral fat rating decreased from 10 to 9  HPI  Chief Complaint: OBESITY  Valerie [name] is here to discuss her progress with her obesity treatment plan. She is on the the Category 2 Plan and states she is following her eating plan approximately 95 % of the time. She states she is exercising 45-60 minutes 5 days per week- weights and cardio.   Interval History:  Since last office visit she has been working on sticking to calorie and protein goals. She has been between [id number] and 87-130 grams of protein. She does find she gets hungry when she gets bored. She does like crunchy salty snacks. She has been using [employer] carb smart tortillas. She is using Protein shake from [employer].130 calories and 20 grams of protein. She is going away next weekend for a funeral in [place] . She will be out of control of what food is served but plans to eat the healthiest choices. She has been making pumpkin muffins that she modified.  Trainer- 10 minutes warmup  cardio, 20-30 minutes weights, 10 minutes of cardio has an A day and B day. She is cooking for Thanksgiving   PHYSICAL EXAM:  Blood pressure 110/73, pulse 65, temperature 98.7 F (37.1 C), height 5' 5 (1.651 m), weight 178 lb (80.7 kg), last menstrual period 12/23/2021, SpO2 98%. Body mass index is 29.62 kg/m.  General: Well Developed, well nourished, and in no acute distress.  HEENT: Normocephalic, atraumatic; EOMI, sclerae are anicteric. Skin: Warm and dry, good turgor Chest:  Normal excursion, shape, no gross ABN Respiratory: No conversational dyspnea; speaking in full sentences NeuroM-Sk:  Normal gross ROM * 4 extremities  Psych: A and O X 3, insight adequate, mood- full    DIAGNOSTIC DATA REVIEWED:  Last metabolic panel Lab Results  Component Value Date   GLUCOSE 84 01/05/2024   NA CANCELED 01/05/2024   K CANCELED 01/05/2024   CL CANCELED 01/05/2024   CO2 20 01/05/2024   BUN 18 01/05/2024   CREATININE 1.02 (H) 01/05/2024   EGFR 65 01/05/2024   CALCIUM 9.0 01/05/2024   PROT 7.1 01/05/2024   ALBUMIN 4.5 01/05/2024   LABGLOB 2.6 01/05/2024   AGRATIO 1.8 10/21/2021   BILITOT 0.4 01/05/2024   ALKPHOS 48 (L) 01/05/2024   AST 28 01/05/2024   ALT 36 (H) 01/05/2024   ANIONGAP 5 08/29/2022     Lab Results  Component Value Date   HGBA1C 5.5 01/05/2024   HGBA1C 5.4 04/25/2020  Lab Results  Component Value Date   INSULIN  13.2 01/05/2024   Lab Results  Component Value Date   TSH 0.750 01/05/2024   CBC    Component Value Date/Time   WBC 5.6 01/05/2024 0947   WBC 5.7 11/27/2023 0829   RBC 5.01 01/05/2024 0947   RBC 4.77 11/27/2023 0829   HGB 16.5 (H) 01/05/2024 0947   HCT 49.6 (H) 01/05/2024 0947   PLT 205 01/05/2024 0947   MCV 99 (H) 01/05/2024 0947   MCH 32.9 01/05/2024 0947   MCH 31.8 08/21/2023 1613   MCHC 33.3 01/05/2024 0947   MCHC 33.3 11/27/2023 0829   RDW 11.8 01/05/2024 0947   Lipid Panel     Component Value Date/Time   CHOL 188  08/21/2023 1613   CHOL 178 10/22/2022 0851   TRIG 114 08/21/2023 1613   HDL 56 08/21/2023 1613   HDL 56 10/22/2022 0851   CHOLHDL 3.4 08/21/2023 1613   VLDL 25.8 08/19/2022 1604   LDLCALC 110 (H) 08/21/2023 1613   Nutritional Lab Results  Component Value Date   VD25OH 63.0 01/05/2024   VD25OH 72.1 10/22/2022   VD25OH 34.5 05/07/2020   Lab Results  Component Value Date   VITAMINB12 927 01/05/2024     ASSESSMENT AND PLAN  Class 1 obesity with serious comorbidity and body mass index (BMI) of 30.0 to 30.9 in adult, unspecified obesity type current 30.12  TREATMENT PLAN FOR OBESITY:  Recommended Dietary Goals  Flower is currently in the action stage of change. As such, her goal is to continue weight management plan. She has agreed to the Category 2 Plan.  Behavioral Intervention  We discussed the following Behavioral Modification Strategies today: continue to work on maintaining a reduced calorie state, getting the recommended amount of protein, incorporating whole foods, making healthy choices, staying well hydrated and practicing mindfulness when eating. and increase protein intake, fibrous foods (25 grams per day for women, 30 grams for men) and water to improve satiety and decrease hunger signals. Continues to work on 10:1 calories to protein. Reviewed protein ball recipes as possible snack. Discussed adding unflavored protein powder to her pumpkin muffins   Recommended Physical Activity Goals  Esti has been advised to work up to 150 minutes of moderate intensity aerobic activity a week and strengthening exercises 2-3 times per week for cardiovascular health, weight loss maintenance and preservation of muscle mass.   She has agreed to Increase physical activity in their day and reduce sedentary time (increase NEAT)., Increase volume of physical activity to a goal of 240 minutes a week, and Combine aerobic and strengthening exercises for efficiency and improved cardiometabolic  health.   Pharmacotherapy We discussed various medication options to help Kiele with her weight loss efforts and we both agreed to continue nutrition and behavior modification.  ASSOCIATED CONDITIONS ADDRESSED TODAY  Action/Plan  Insulin  resistance       Continue to focus on category 2 meal plan       Limit simple carbohydrates       Work on increasing fiber.       Continue combination of cardio and strength training with a goal of 240 minutes.   Pure hypercholesterolemia Continue to implement category 2 meal plan, limit saturated fats, increase fiber(vegetables) Continue combination of cardio and strength training with a goal of 240 minutes.   Vitamin D  deficiency       Continue Vit D3 5000 units daily, last lab was at goal  Vitamin d  supplementation has been shown to decrease fatigue, decrease risk of progression to insulin  resistance and then prediabetes, and decreases risk of falling in older age.     Return in about 2 weeks (around 03/03/2024).SABRA She was informed of the importance of frequent follow up visits to maximize her success with intensive lifestyle modifications for her multiple health conditions.   ATTESTASTION STATEMENTS:  Reviewed by clinician on day of visit: allergies, medications, problem list, medical history, surgical history, family history, social history, and previous encounter notes.   I personally spent a total of 40 minutes in the care of the patient today including preparing to see the patient, getting/reviewing separately obtained history, performing a medically appropriate exam/evaluation, counseling and educating, and documenting clinical information in the EHR.   Augusta Mirkin ANP-C

## 2024-03-01 ENCOUNTER — Encounter (INDEPENDENT_AMBULATORY_CARE_PROVIDER_SITE_OTHER): Payer: Self-pay | Admitting: Family Medicine

## 2024-03-01 ENCOUNTER — Ambulatory Visit (INDEPENDENT_AMBULATORY_CARE_PROVIDER_SITE_OTHER): Payer: Self-pay | Admitting: Family Medicine

## 2024-03-01 VITALS — BP 115/71 | HR 78 | Temp 98.0°F | Ht 65.0 in | Wt 174.0 lb

## 2024-03-01 DIAGNOSIS — E88819 Insulin resistance, unspecified: Secondary | ICD-10-CM

## 2024-03-01 DIAGNOSIS — E669 Obesity, unspecified: Secondary | ICD-10-CM

## 2024-03-01 DIAGNOSIS — E063 Autoimmune thyroiditis: Secondary | ICD-10-CM

## 2024-03-01 DIAGNOSIS — Z6828 Body mass index (BMI) 28.0-28.9, adult: Secondary | ICD-10-CM

## 2024-03-01 DIAGNOSIS — E559 Vitamin D deficiency, unspecified: Secondary | ICD-10-CM | POA: Diagnosis not present

## 2024-03-01 DIAGNOSIS — Z6831 Body mass index (BMI) 31.0-31.9, adult: Secondary | ICD-10-CM

## 2024-03-01 NOTE — Progress Notes (Signed)
 Valerie DOROTHA [name], D.O.  ABFM, ABOM Specializing in Clinical Bariatric Medicine  Office located at: [address] [place] [place], [place]  [address]      A) FOR THE CHRONIC DISEASE OF OBESITY:  Chief complaint: Obesity Valerie [name] is here to discuss her progress with her obesity treatment plan.   History of present illness / Interval history:  Valerie [name] is here today for her follow-up office visit.  Since last OV on 02/18/24, pt is down 4 lbs. Patient states that she feels like her clothes are fitting her differently and that she is eating all her protein. Of note she states that at the end of the day she still has calories and proteins.     02/18/24 14:00 03/01/24 15:00   Body Fat % 39.3 % 39.4 %  Muscle Mass (lbs) 103 lbs 100.6 lbs  Fat Mass (lbs) 70.2 lbs 68.8 lbs  Total Body Water (lbs) 72.8 lbs 70.8 lbs  Visceral Fat Rating  9 9    Counseling done on how various foods will affect these numbers and how to maximize success   Total lbs lost to date: - 14 lbs Total Fat Mass in lbs lost to date: - 7.6 lbs Total weight loss percentage to date: - 7.45 %    Obesity (BMI) of 31.0 to 31.9 in adult, start 31.28 Obesity,adult current BMI 28.96  Nutrition Therapy She is journaling 1100-1200 calories with 90 + g protein and states she is following her eating plan approximately 95 % of the time.   - Tracking Calories/Macros: yes  - Eating More Whole Foods: yes  - Adequate Protein Intake: yes  - Adequate Water Intake: yes  - Skipping Meals: no   - Sleeping 7-9 Hours/ Night: yes   Valerie [name] is currently in the action stage of change. As such, her goal is to continue weight management plan.  She has agreed to: continue current plan   Physical Activity Valerie [name] is doing cardio or strength training 45-60  minutes 5 days per week   Valerie [name] has been advised to work up to 300-450 minutes of moderate intensity aerobic activity a week and strengthening exercises 2-3 times per  week for cardiovascular health, weight loss maintenance and preservation of muscle mass.  She has agreed to : Continue current level of physical activity    Behavioral Modifications Evidence-based interventions for health behavior change were utilized today including the discussion of  1) self monitoring techniques:    - Continue tracking  2) problem-solving barriers:    - add pre/post workout snack   3) SMART goals for next OV:    - Increase lean protein (increase egg whites , greek yogurt) - Bring in journaling log to next OV ( journal accurately)   Regarding patient's less desirable eating habits and patterns, we employed the technique of small changes.   We discussed the following today: continue to work on maintaining a reduced calorie state, getting the recommended amount of protein, incorporating whole foods, making healthy choices, staying well hydrated and practicing mindfulness when eating. and focusing on food with a 10:1 ratio of calories: grams of protein  - Extensive discussion of over 35 minutes on how to journal and how to accurately read food labels and how to create healthy recipes. Discussed how to accurately journal these created recipe and ways to eat more protein and keep calories low. Explained that natural protein is better than artificial protein and supplemental protein.  Additional resources provided today: Handout on  traveling and holiday eating strategies, Physician provided patient with handouts and personalized instruction on tracking and journaling using Apps (or how to handwrite in notebook) and using logs provided , and Handout on Daily Food Journaling Log, Handout for Quiche Recipe    Medical Interventions/ Pharmacotherapy Previous Bariatric surgery: n/a Pharmacotherapy for weight loss: She is not currently taking medications  for medical weight loss.    We discussed various medication options to help Valerie [name] with her weight loss efforts and we both  agreed to : Continue with current nutritional and behavioral strategies   B) OBESITY RELATED CONDITIONS ADDRESSED TODAY:  Insulin  resistance Assessment & Plan Lab Results  Component Value Date   HGBA1C 5.5 01/05/2024   HGBA1C 5.5 08/21/2023   HGBA1C 5.4 08/19/2022   INSULIN  13.2 01/05/2024    Diet and life style controlled. Patient states that she is following the meal plan. Her hunger and cravings are well controlled. She endorses sometimes having protein supplements. She states that she will follow the meal plan more closely     Vitamin D  deficiency Assessment & Plan Lab Results  Component Value Date   VD25OH 63.0 01/05/2024   VD25OH 72.1 10/22/2022   VD25OH 34.5 05/07/2020   Taking Vit D 5000 lU daily with reported good compliance and tolerance. No acute concerns today. Continue with supplementation. Will obtain labs as medically necessary.      Hypothyroidism due to Hashimoto's thyroiditis Assessment & Plan Lab Results  Component Value Date   TSH 0.750 01/05/2024   FREET4 1.1 03/09/2023   On Synthroid  1 tablet daily. With good compliance and tolerance. Patient states that symptoms are well controlled. No acute concerns today. Continue with medication. Will follow alongside specialist/PCP.      There are no discontinued medications.   No orders of the defined types were placed in this encounter.     Follow up:   Return 03/22/2024 at 4:00 PM  She was informed of the importance of frequent follow up visits to maximize her success with intensive lifestyle modifications for her multiple health conditions.   Weight Summary and Biometrics   Weight Lost Since Last Visit: 4lb  Weight Gained Since Last Visit: 0lb  Vitals Temp: 98 F (36.7 C) BP: 115/71 Pulse Rate: 78 SpO2: 98 %   Anthropometric Measurements Height: 5' 5 (1.651 m) Weight: 174 lb (78.9 kg) BMI (Calculated): 28.96 Weight at Last Visit: 178lb Weight Lost Since Last Visit: 4lb Weight  Gained Since Last Visit: 0lb Starting Weight: 188lb Total Weight Loss (lbs): 14 lb (6.35 kg) Peak Weight: 195lb   Body Composition  Body Fat %: 39.4 % Fat Mass (lbs): 68.8 lbs Muscle Mass (lbs): 100.6 lbs Total Body Water (lbs): 70.8 lbs Visceral Fat Rating : 9   Other Clinical Data Fasting: no Labs: no Today's Visit #: 5 Starting Date: 01/05/24    Objective:   PHYSICAL EXAM: Blood pressure 115/71, pulse 78, temperature 98 F (36.7 C), height 5' 5 (1.651 m), weight 174 lb (78.9 kg), last menstrual period 12/23/2021, SpO2 98%. Body mass index is 28.96 kg/m.  General: she is overweight, cooperative and in no acute distress. PSYCH: Has normal mood, affect and thought process.   HEENT: EOMI, sclerae are anicteric. Lungs: Normal breathing effort, no conversational dyspnea. Extremities: Moves * 4 Neurologic: A and O * 3, good insight  DIAGNOSTIC DATA REVIEWED: BMET    Component Value Date/Time   NA CANCELED 01/05/2024 0947   K CANCELED 01/05/2024 0947   CL CANCELED 01/05/2024  0947   CO2 20 01/05/2024 0947   GLUCOSE 84 01/05/2024 0947   GLUCOSE 96 11/12/2023 0758   BUN 18 01/05/2024 0947   CREATININE 1.02 (H) 01/05/2024 0947   CREATININE 0.97 08/21/2023 1613   CALCIUM 9.0 01/05/2024 0947   GFRNONAA >60 08/29/2022 0945   GFRNONAA >60 11/08/2021 1016   Lab Results  Component Value Date   HGBA1C 5.5 01/05/2024   HGBA1C 5.4 04/25/2020   Lab Results  Component Value Date   INSULIN  13.2 01/05/2024   Lab Results  Component Value Date   TSH 0.750 01/05/2024   CBC    Component Value Date/Time   WBC 5.6 01/05/2024 0947   WBC 5.7 11/27/2023 0829   RBC 5.01 01/05/2024 0947   RBC 4.77 11/27/2023 0829   HGB 16.5 (H) 01/05/2024 0947   HCT 49.6 (H) 01/05/2024 0947   PLT 205 01/05/2024 0947   MCV 99 (H) 01/05/2024 0947   MCH 32.9 01/05/2024 0947   MCH 31.8 08/21/2023 1613   MCHC 33.3 01/05/2024 0947   MCHC 33.3 11/27/2023 0829   RDW 11.8 01/05/2024 0947    Iron Studies No results found for: IRON, TIBC, FERRITIN, IRONPCTSAT Lipid Panel     Component Value Date/Time   CHOL 188 08/21/2023 1613   CHOL 178 10/22/2022 0851   TRIG 114 08/21/2023 1613   HDL 56 08/21/2023 1613   HDL 56 10/22/2022 0851   CHOLHDL 3.4 08/21/2023 1613   VLDL 25.8 08/19/2022 1604   LDLCALC 110 (H) 08/21/2023 1613   Hepatic Function Panel     Component Value Date/Time   PROT 7.1 01/05/2024 0947   ALBUMIN 4.5 01/05/2024 0947   AST 28 01/05/2024 0947   AST 20 11/08/2021 1016   ALT 36 (H) 01/05/2024 0947   ALT 26 11/08/2021 1016   ALKPHOS 48 (L) 01/05/2024 0947   BILITOT 0.4 01/05/2024 0947   BILITOT 0.4 11/08/2021 1016   BILIDIR 0.11 06/11/2021 0806      Component Value Date/Time   TSH 0.750 01/05/2024 0947   Nutritional Lab Results  Component Value Date   VD25OH 63.0 01/05/2024   VD25OH 72.1 10/22/2022   VD25OH 34.5 05/07/2020    Attestations:   LILLETTE Sonny Laroche, acting as a stage manager for Valerie Jenkins, DO., have compiled all relevant documentation for today's office visit on behalf of Valerie Jenkins, DO, while in the presence of [employer], DO.  I have spent 44 minutes in the care of the patient today including 34 minutes face-to-face assessing and reviewing listed medical problems above as outlined in office visit note and providing nutritional and behavioral counseling as outlined in obesity care plan.   I have reviewed the above documentation for accuracy and completeness, and I agree with the above. Valerie [name], D.O.  The 21st Century Cures Act was signed into law in 2016 which includes the topic of electronic health records.  This provides immediate access to information in MyChart.  This includes consultation notes, operative notes, office notes, lab results and pathology reports.  If you have any questions about what you read please let us  know at your next visit so we can discuss your concerns and take  corrective action if need be.  We are right here with you.

## 2024-03-14 ENCOUNTER — Encounter: Payer: Self-pay | Admitting: Allergy

## 2024-03-14 ENCOUNTER — Encounter: Payer: Self-pay | Admitting: Internal Medicine

## 2024-03-14 ENCOUNTER — Encounter (HOSPITAL_BASED_OUTPATIENT_CLINIC_OR_DEPARTMENT_OTHER): Payer: Self-pay | Admitting: Orthopaedic Surgery

## 2024-03-16 NOTE — Progress Notes (Unsigned)
 03/17/2024 Valerie [name] [id number] Apr 11, 1971  Referring provider: Wendolyn Jenkins Jansky, MD Primary GI doctor: Dr. Federico  ASSESSMENT AND PLAN:  Dysphagia previous dilatation 2023//LPR/hiatal hernia Tapered off omeprazole  after Sept at recommendation of ENT and started reflux gourmet, only taking at night Last few weeks started with dysphagia, has to drink water, denies regurgitation 01/08/2022 EGD benign-appearing esophageal stenosis status post dilatation 2 cm HH gastritis normal duodenum Denies NSAIDS, ETOH, tobacco  Suspicious for stenosis with history and being off PPI with previous dilatation 2023, possible GERD dysmotility. She would still like to stay off PPI for time being, will add on pepcid, continue reflux gourmet and pending EGD findings can add back on PPI -Schedule EGD with dilatation at [facility] with Dr. Legrand in Dr. Lafonda absence and request of sooner appointment, will evaluate for stenosis, tumor, erosive/infectious esophagititis, and EOE. I discussed risks of EGD with patient today, including risk of sedation, bleeding or perforation.  Patient provides understanding and gave verbal consent to proceed. - consider barium swallow if unremarkable EGD -In the interim patient advised about swallowing precautions.  -Eat slowly, chew food well before swallowing.  -Drink liquids in between each bite to avoid food impaction. - ER precautions discussed with the patient  Personal history of sessile serrated polyp 09/06/2021 colonoscopy normal TI granular mucosa ileocecal valve 6 mm polyp cecum nonbleeding internal hemorrhoids Recall 5 years  Patient Care Team: Wendolyn Jenkins Jansky, MD as PCP - General (Family Medicine) Lonni Slain, MD as PCP - Cardiology (Cardiology) Vanderbilt Ned, MD as Consulting Physician (General Surgery) Odean Potts, MD as Consulting Physician (Hematology and Oncology) Dewey Rush, MD as Consulting Physician (Radiation Oncology) Sater,  Charlie LABOR, MD (Neurology)  HISTORY OF PRESENT ILLNESS: 53 y.o. female with a past medical history listed below presents for evaluation of dysphagia.   Discussed the use of AI scribe software for clinical note transcription with the patient, who gave verbal consent to proceed.  History of Present Illness   Valerie [name] is a 53 year old female with a history of reflux who presents with swallowing difficulties.  She has been experiencing swallowing difficulties for the past few weeks, particularly with solid foods. When consuming foods like pizza, she feels as though the food is stuck and requires water to help it pass. However, she does not experience this issue with softer foods like soup or noodles.  She has a history of reflux and was previously on omeprazole  40 mg daily. After a consultation with an ENT in September 2023, she began tapering off omeprazole  and switched to using Reflux Gourmet nightly. Since then, she occasionally experiences heartburn but describes it as manageable. She also uses Tums occasionally for heartburn relief.  She was advised by an allergist to try Ryaltris  nasal spray, but she discontinued its use due to side effects of nasal dryness and bleeding. She has not been using any NSAIDs like Aleve or ibuprofen , and she denies alcohol and tobacco use.  No regurgitation, black stools, shortness of breath, chest pain, or abdominal pain.        She  reports that she has never smoked. She has never used smokeless tobacco. She reports that she does not drink alcohol and does not use drugs.  RELEVANT GI HISTORY, IMAGING AND LABS: Results          CBC    Component Value Date/Time   WBC 5.6 01/05/2024 0947   WBC 5.7 11/27/2023 0829   RBC 5.01 01/05/2024 0947  RBC 4.77 11/27/2023 0829   HGB 16.5 (H) 01/05/2024 0947   HCT 49.6 (H) 01/05/2024 0947   PLT 205 01/05/2024 0947   MCV 99 (H) 01/05/2024 0947   MCH 32.9 01/05/2024 0947   MCH 31.8 08/21/2023 1613    MCHC 33.3 01/05/2024 0947   MCHC 33.3 11/27/2023 0829   RDW 11.8 01/05/2024 0947   LYMPHSABS 1.5 01/05/2024 0947   MONOABS 0.7 11/27/2023 0829   EOSABS 0.3 01/05/2024 0947   BASOSABS 0.0 01/05/2024 0947   Recent Labs    05/06/23 1638 08/21/23 1613 11/12/23 0758 11/27/23 0829 01/05/24 0947  HGB 14.9 14.4 14.7 15.3* 16.5*    CMP     Component Value Date/Time   NA CANCELED 01/05/2024 0947   K CANCELED 01/05/2024 0947   CL CANCELED 01/05/2024 0947   CO2 20 01/05/2024 0947   GLUCOSE 84 01/05/2024 0947   GLUCOSE 96 11/12/2023 0758   BUN 18 01/05/2024 0947   CREATININE 1.02 (H) 01/05/2024 0947   CREATININE 0.97 08/21/2023 1613   CALCIUM 9.0 01/05/2024 0947   PROT 7.1 01/05/2024 0947   ALBUMIN 4.5 01/05/2024 0947   AST 28 01/05/2024 0947   AST 20 11/08/2021 1016   ALT 36 (H) 01/05/2024 0947   ALT 26 11/08/2021 1016   ALKPHOS 48 (L) 01/05/2024 0947   BILITOT 0.4 01/05/2024 0947   BILITOT 0.4 11/08/2021 1016   GFRNONAA >60 08/29/2022 0945   GFRNONAA >60 11/08/2021 1016      Latest Ref Rng & Units 01/05/2024    9:47 AM 11/12/2023    7:58 AM 08/21/2023    4:13 PM  Hepatic Function  Total Protein 6.0 - 8.5 g/dL 7.1  6.7  6.2   Albumin 3.8 - 4.9 g/dL 4.5  4.0    AST 0 - 40 IU/L 28  22  20    ALT 0 - 32 IU/L 36  29  29   Alk Phosphatase 49 - 135 IU/L 48  33    Total Bilirubin 0.0 - 1.2 mg/dL 0.4  0.5  0.4       Current Medications:   Current Outpatient Medications (Endocrine & Metabolic):    levothyroxine  (SYNTHROID ) 88 MCG tablet, TAKE 1 TABLET BY MOUTH DAILY   Current Outpatient Medications (Respiratory):    levocetirizine (XYZAL ) 5 MG tablet, Take 1 tablet (5 mg total) by mouth every evening.   Olopatadine-Mometasone (RYALTRIS ) 665-25 MCG/ACT SUSP, 1-2 sprays per nostril twice a day  Current Outpatient Medications (Analgesics):    ACTEMRA  ACTPEN 162 MG/0.9ML SOAJ, Inject 0.9 mLs into the skin once a week.   Current Outpatient Medications (Other):    ascorbic  acid (VITAMIN C) 1000 MG tablet, Take 1 tablet by mouth daily.   cholecalciferol  (VITAMIN D3) 25 MCG (1000 UNIT) tablet, Take 5,000 Units by mouth daily.   MAGNESIUM GLYCINATE PO, Take by mouth.   Probiotic Product (CULTRELLE KIDS IMMUNE DEFENSE PO), Take 1 tablet by mouth daily.   UNABLE TO FIND, Take 4 tablets by mouth daily. Med Name: Omega Pure 2 tablet in AM and 1 tablet in PM   UNABLE TO FIND, Take by mouth daily. Med Name: Ultra Inflamix 2 scoops daily   UNABLE TO FIND, Take 2 tablets by mouth daily. Med Name: R-Lipoic Acid  Medical History:  Past Medical History:  Diagnosis Date   Abnormal echocardiogram 11/28/2021   EF 60 -65%, abnormal global longitudinal strain, see cardiology OV in Epic dated 12/25/21   Acid reflux    Allergy  4/25  See notes from Dr Luke   B12 deficiency    Back pain    Bilateral swelling of feet    Breast cancer (HCC)    Cancer (HCC) 10/2020   left breast, ER+, s/p radiation therapy 12/25/20 - 01/18/21, Patient follows with Dr. Mackey Chad @ [facility].   Constipation    COVID-19 11/2019   flu-like symptoms, cough, no hospitalizations   Depression    Edema    lower extremity, follows w/ [facility], Sherrilee Dinning, [place]. See OV dated 03/24/22 in Epic.   GERD (gastroesophageal reflux disease)    Follows w/ Dr. Rosario Guppy, gastroenterologist and PCP, Dr. Jenkins Jansky [facility] @ [facility].   Hashimoto's disease    Follows with Dr. Tawni Fendt @ [place].   Heart burn    History of hiatal hernia    2 cm hiatal hernia per 01/08/22 EGD   History of radiation therapy 2022   completed 01-18-2021   [facility] (hard of hearing)    has gotten bilateral aides.   Hypothyroidism    Follows w/ endocronologist, Dr. Tawni Fendt @ [place].   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 Charlie Crete @ [facility].   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), 04/2020 left eye   Personal history of radiation therapy    PONV (postoperative nausea and vomiting)    Post-nasal drip    Shortness of breath    Status post dilation of esophageal narrowing 01/08/2022   EGD, esophagus dilated   Swallowing difficulty    Throat clearing    Wears glasses    Allergies: No Known Allergies   Surgical History:  She  has a past surgical history that includes Tubal ligation (02/2012); Biopsy breast (Left, 10/17/2020); Breast lumpectomy with radioactive seed and sentinel lymph node biopsy (Left, 11/15/2020); Wisdom tooth extraction; transthoracic echocardiogram (11/28/2021); transthoracic echocardiogram (07/05/2020); CT CORONARY CA SCORING (07/13/2020); Colonoscopy (09/06/2021); Esophagogastroduodenoscopy (01/08/2022); Total laparoscopic hysterectomy with salpingectomy (Bilateral, 04/23/2022); Cystoscopy (N/A, 04/23/2022); Breast surgery (August 2022); Abdominal hysterectomy (04/23/22); Breast lumpectomy; and hip tear repair. Family History:  Her family history includes Amblyopia in her father; Aortic aneurysm in her father; Arthritis in her maternal grandfather and maternal grandmother; Cancer (age of onset: 44) in her mother; Colon polyps in her father and mother; Diabetes in her maternal aunt; Eczema in her mother; Fainting in her maternal grandmother; GER disease in her father and mother; Glaucoma in her maternal grandmother; Hearing loss in her father and mother; Heart disease in her paternal grandfather; Heart murmur in her brother, father, paternal aunt, and paternal grandfather; Hyperlipidemia in her father; Hypertension in her mother; Hypothyroidism in her  mother; Macular degeneration in her maternal grandmother; Migraines in her mother; Mitral valve prolapse in her father; Multiple sclerosis in her paternal grandmother; Neuropathy in her father; Osteoarthritis in her mother; Other in her father and mother; Thyroid  disease in her mother.  REVIEW OF SYSTEMS  : All other systems reviewed and negative except where noted in the History of Present Illness.  PHYSICAL EXAM: BP 110/70 (BP  Location: Right Arm, Patient Position: Sitting, Cuff Size: Normal)   Pulse 84   Ht 5' 5.5 (1.664 m)   Wt 177 lb 8 oz (80.5 kg)   LMP 12/23/2021 (Exact Date)   BMI 29.09 kg/m  Physical Exam   GENERAL APPEARANCE: Well nourished, in no apparent distress. HEENT: No cervical lymphadenopathy, unremarkable thyroid , sclerae anicteric, conjunctiva pink. RESPIRATORY: Respiratory effort normal, breath sounds equal bilaterally without rales, rhonchi, wheezing, no shortness of breath, no chest pain. CARDIO: Regular rate and rhythm with no murmurs, rubs, or gallops, peripheral pulses intact. ABDOMEN: Soft, non-distended, active bowel sounds in all four quadrants, no tenderness to palpation, no rebound, no mass appreciated, no abdominal pain. RECTAL: Declines. MUSCULOSKELETAL: Full range of motion, normal gait, without edema. SKIN: Dry, intact without rashes or lesions. No jaundice. NEURO: Alert, oriented, no focal deficits. PSYCH: Cooperative, normal mood and affect.      Alan JONELLE Coombs, PA-C 9:25 AM

## 2024-03-17 ENCOUNTER — Encounter: Payer: Self-pay | Admitting: Gastroenterology

## 2024-03-17 ENCOUNTER — Encounter: Payer: Self-pay | Admitting: Physician Assistant

## 2024-03-17 ENCOUNTER — Ambulatory Visit: Admitting: Physician Assistant

## 2024-03-17 VITALS — BP 110/70 | HR 84 | Ht 65.5 in | Wt 177.5 lb

## 2024-03-17 DIAGNOSIS — Z860101 Personal history of adenomatous and serrated colon polyps: Secondary | ICD-10-CM | POA: Diagnosis not present

## 2024-03-17 DIAGNOSIS — K219 Gastro-esophageal reflux disease without esophagitis: Secondary | ICD-10-CM | POA: Diagnosis not present

## 2024-03-17 DIAGNOSIS — Z8719 Personal history of other diseases of the digestive system: Secondary | ICD-10-CM

## 2024-03-17 DIAGNOSIS — R1310 Dysphagia, unspecified: Secondary | ICD-10-CM | POA: Diagnosis not present

## 2024-03-17 DIAGNOSIS — K449 Diaphragmatic hernia without obstruction or gangrene: Secondary | ICD-10-CM | POA: Diagnosis not present

## 2024-03-17 NOTE — Patient Instructions (Addendum)
 _______________________________________________________  If your blood pressure at your visit was 140/90 or greater, please contact your primary care physician to follow up on this.  _______________________________________________________  If you are age 54 or older, your body mass index should be between 23-30. Your Body mass index is 29.09 kg/m. If this is out of the aforementioned range listed, please consider follow up with your Primary Care Provider.  If you are age 77 or younger, your body mass index should be between 19-25. Your Body mass index is 29.09 kg/m. If this is out of the aformentioned range listed, please consider follow up with your Primary Care Provider.   ________________________________________________________  The [facility] providers would like to encourage you to use MYCHART to communicate with providers for non-urgent requests or questions.  Due to long hold times on the telephone, sending your provider a message by [facility] may be a faster and more efficient way to get a response.  Please allow 48 business hours for a response.  Please remember that this is for non-urgent requests.  _______________________________________________________  [facility] is using a team-based approach to care.  Your team is made up of your doctor and two to three APPS. Our APPS (Nurse Practitioners and Physician Assistants) work with your physician to ensure care continuity for you. They are fully qualified to address your health concerns and develop a treatment plan. They communicate directly with your gastroenterologist to care for you. Seeing the Advanced Practice Practitioners on your physician's team can help you by facilitating care more promptly, often allowing for earlier appointments, access to diagnostic testing, procedures, and other specialty referrals.   You have been scheduled for an endoscopy. Please follow written instructions given to you at your visit  today.  If you use inhalers (even only as needed), please bring them with you on the day of your procedure.  If you take any of the following medications, they will need to be adjusted prior to your procedure:   DO NOT TAKE 7 DAYS PRIOR TO TEST- Trulicity (dulaglutide) Ozempic, Wegovy (semaglutide) Mounjaro, Zepbound (tirzepatide) Bydureon Bcise (exanatide extended release)  DO NOT TAKE 1 DAY PRIOR TO YOUR TEST Rybelsus (semaglutide) Adlyxin (lixisenatide) Victoza (liraglutide) Byetta (exanatide) ___________________________________________________________________________  Due to recent changes in healthcare laws, you may see the results of your imaging and laboratory studies on MyChart before your provider has had a chance to review them.  We understand that in some cases there may be results that are confusing or concerning to you. Not all laboratory results come back in the same time frame and the provider may be waiting for multiple results in order to interpret others.  Please give us  48 hours in order for your provider to thoroughly review all the results before contacting the office for clarification of your results.   Reflux Gourmet Rescue  It is an ALGINATE THERAPY which is the only intervention that works to safeguard the esophagus by creating a protective barrier that actually stops reflux from happening. -The general directions for use are as stated on the packaging: Take 1 teaspoon (5 ml), or more as needed or as directed by your physician, after meals and before bed. -These general directions address the most common times for reflux to occur, but our Rescue products may be taken anytime. Some individuals may take our product preemptively, when they know they will suffer from reflux, or as needed - when discomfort arises. (If taken around food, it should be consumed last.) -You do not have to take  1 teaspoon (5 ml) of the product. While one teaspoon (5ml) may be the perfect  average amount to relieve reflux suffering in some, others may require more or less. You may adjust the amount of Mint Chocolate Rescue and Vanilla Caramel Rescue to the lowest amount necessary to meet your individual needs to improve your quality of life. -You may dilute the product if it is too viscous for you to consume. Keep in mind, however, that the thickness of the product was formulated to provide optimal coating and protection of your throat and esophagus. Though diluting the product is possible, it may reduce the protective function and/or length of action. -This can be used in conjunction with reflux medications and lifestyle changes.  100% ALL-NATURAL  Paraben FREE, glycerin FREE, & potassium FREE  Made entirely from all-natural ingredients considered safe for children and during pregnancy  No known side effects  All-natural flavor Gluten FREE  Allergen FREE  Vegan  Can find more information here: [email or web address]-science-[email or web address]-[email or web address]

## 2024-03-18 ENCOUNTER — Other Ambulatory Visit (HOSPITAL_BASED_OUTPATIENT_CLINIC_OR_DEPARTMENT_OTHER): Payer: Self-pay | Admitting: Student

## 2024-03-18 ENCOUNTER — Ambulatory Visit (HOSPITAL_BASED_OUTPATIENT_CLINIC_OR_DEPARTMENT_OTHER)

## 2024-03-18 ENCOUNTER — Ambulatory Visit (INDEPENDENT_AMBULATORY_CARE_PROVIDER_SITE_OTHER): Admitting: Student

## 2024-03-18 DIAGNOSIS — M25552 Pain in left hip: Secondary | ICD-10-CM | POA: Diagnosis not present

## 2024-03-18 DIAGNOSIS — M25512 Pain in left shoulder: Secondary | ICD-10-CM

## 2024-03-18 DIAGNOSIS — M7542 Impingement syndrome of left shoulder: Secondary | ICD-10-CM | POA: Diagnosis not present

## 2024-03-18 NOTE — Progress Notes (Signed)
 Chief Complaint: Left shoulder and hip pain    Discussed the use of AI scribe software for clinical note transcription with the patient, who gave verbal consent to proceed.  History of Present Illness Valerie [name] is a 53 year old right-hand-dominant female who presents with left shoulder and hip pain.  She has had left shoulder pain for about a month, described as severe, especially with lifting her arm. The pain is mainly in the posterior and lateral shoulder, worsens with certain movements and at night, and interferes with sleep. She cannot lift the shoulder beyond a certain height without pain and notes popping with some movements. She denies radiating pain, numbness, tingling, or neck pain.  Her left hip pain has been present longer and she underwent a right hip labral tear repair about a year ago. The current hip pain is less severe than the shoulder pain, with good range of motion. It is mainly lateral and can be bothersome with sitting or sleeping. Her mother has arthritis and she is worried about developing arthritis. She has tried exercises and chiropractic care with minimal improvement. She previously used an anti-inflammatory supplement and now uses turmeric.   Surgical History:   None  PMH/PSH/Family History/Social History/Meds/Allergies:    Past Medical History:  Diagnosis Date   Abnormal echocardiogram 11/28/2021   EF 60 -65%, abnormal global longitudinal strain, see cardiology OV in Epic dated 12/25/21   Acid reflux    Allergy  4/25   See notes from Dr Luke   B12 deficiency    Back pain    Bilateral swelling of feet    Breast cancer (HCC)    Cancer (HCC) 10/2020   left breast, ER+, s/p radiation therapy 12/25/20 - 01/18/21, Patient follows with Dr. Mackey Chad @ [facility].   Constipation    COVID-19 11/2019   flu-like symptoms, cough, no hospitalizations   Depression    Edema    lower extremity, follows w/ [facility], Sherrilee Dinning, [place]. See OV dated 03/24/22 in Epic.   GERD (gastroesophageal reflux disease)    Follows w/ Dr. Rosario Guppy, gastroenterologist and PCP, Dr. Jenkins Jansky [facility] @ [facility].   Hashimoto's disease    Follows with Dr. Tawni Fendt @ [place].   Heart burn    History of hiatal hernia    2 cm hiatal hernia per 01/08/22 EGD   History of radiation therapy 2022   completed 01-18-2021   [facility] (hard of hearing)    has gotten bilateral aides.   Hypothyroidism    Follows w/ endocronologist, Dr. Tawni Fendt @ [place].   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 Charlie Crete @ [facility].   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), 04/2020 left eye   Personal history of radiation therapy    PONV (postoperative nausea and vomiting)    Post-nasal drip  Shortness of breath    Status post dilation of esophageal narrowing 01/08/2022   EGD, esophagus dilated   Swallowing difficulty    Throat clearing    Wears glasses    Past Surgical History:  Procedure Laterality Date   ABDOMINAL HYSTERECTOMY  04/23/22   BIOPSY BREAST Left 10/17/2020   BREAST LUMPECTOMY     BREAST LUMPECTOMY WITH RADIOACTIVE SEED AND SENTINEL LYMPH NODE BIOPSY Left 11/15/2020   Procedure: LEFT BREAST LUMPECTOMY WITH RADIOACTIVE SEED AND LEFT SENTINEL LYMPH NODE BIOPSY;  Surgeon: Vanderbilt Ned, MD;  Location: [facility];  Service: General;  Laterality: Left;   BREAST SURGERY  August 2022   Cancer surgery   COLONOSCOPY  09/06/2021   cecal polyp   CT CORONARY CA SCORING   07/13/2020   Coronary Calcium = 0 (in Epic)   CYSTOSCOPY N/A 04/23/2022   Procedure: CYSTOSCOPY;  Surgeon: Cleotilde Ronal RAMAN, MD;  Location: [facility];  Service: Gynecology;  Laterality: N/A;   ESOPHAGOGASTRODUODENOSCOPY  01/08/2022   2cm hiatal hernia, esophagus dilated   hip tear repair     12/24   TOTAL LAPAROSCOPIC HYSTERECTOMY WITH SALPINGECTOMY Bilateral 04/23/2022   Procedure: TOTAL LAPAROSCOPIC HYSTERECTOMY BILATERAL SALPINGECTOMY, LEFT OOPHORECTOMY;  Surgeon: Cleotilde Ronal RAMAN, MD;  Location: [facility];  Service: Gynecology;  Laterality: Bilateral;   TRANSTHORACIC ECHOCARDIOGRAM  11/28/2021   see results in Epic   TRANSTHORACIC ECHOCARDIOGRAM  07/05/2020   in Epic   TUBAL LIGATION  02/2012   WISDOM TOOTH EXTRACTION     early 1990's   Social History   Socioeconomic History   Marital status: Married    Spouse name: Aubriauna Riner   Number of children: 2   Years of education: BS   Highest education level: Bachelor's degree (e.g., BA, AB, BS)  Occupational History   Occupation: Psychologist, Occupational   Occupation: Shipping and receiving clerk   Occupation: barrister's clerk  Tobacco Use   Smoking status: Never   Smokeless tobacco: Never  Vaping Use   Vaping status: Never Used  Substance and Sexual Activity   Alcohol use: Never   Drug use: Never   Sexual activity: Yes    Birth control/protection: Surgical, None    Comment: hysterectomy  Other Topics Concern   Not on file  Social History Narrative   Right handed    Caffeine use: rare   Social Drivers of Health   Financial Resource Strain: Low Risk  (11/12/2023)   Overall Financial Resource Strain (CARDIA)    Difficulty of Paying Living Expenses: Not hard at all  Food Insecurity: No Food Insecurity (11/12/2023)   Hunger Vital Sign    Worried About Running Out of Food in the Last Year: Never true    Ran Out of Food in the Last Year: Never true  Transportation Needs: No  Transportation Needs (11/12/2023)   PRAPARE - Administrator, Civil Service (Medical): No    Lack of Transportation (Non-Medical): No  Physical Activity: Sufficiently Active (11/12/2023)   Exercise Vital Sign    Days of Exercise per Week: 5 days    Minutes of Exercise per Session: 50 min  Stress: No Stress Concern Present (11/12/2023)   [employer] of Occupational Health - Occupational Stress Questionnaire    Feeling of Stress: Only a little  Social Connections: Socially Integrated (11/12/2023)   Social Connection and Isolation Panel    Frequency of Communication with Friends and Family: Three times a week  Frequency of Social Gatherings with Friends and Family: Twice a week    Attends Religious Services: More than 4 times per year    Active Member of [employer] or Organizations: Yes    Attends Engineer, Structural: More than 4 times per year    Marital Status: Married   Family History  Problem Relation Age of Onset   Hearing loss Mother    Thyroid  disease Mother    Cancer Mother 37       endometrial cancer, lynch negative   GER disease Mother    Other Mother        uterine fibroids, optic disc abnormality    Eczema Mother    Colon polyps Mother    Hypothyroidism Mother    Osteoarthritis Mother    Hypertension Mother    Migraines Mother    Mitral valve prolapse Father        regurgitation/prolase   Colon polyps Father    Hearing loss Father    Other Father        Colon adenoma   Hyperlipidemia Father    GER disease Father    Heart murmur Father    Amblyopia Father        Right eye   Neuropathy Father    Aortic aneurysm Father    Heart murmur Brother    Glaucoma Maternal Grandmother    Macular degeneration Maternal Grandmother    Arthritis Maternal Grandmother    Fainting Maternal Grandmother    Arthritis Maternal Grandfather    Multiple sclerosis Paternal Grandmother    Heart murmur Paternal Grandfather    Heart disease Paternal Grandfather     Diabetes Maternal Aunt    Heart murmur Paternal Aunt    Breast cancer Neg Hx    Colon cancer Neg Hx    Esophageal cancer Neg Hx    Rectal cancer Neg Hx    Stomach cancer Neg Hx    No Known Allergies Current Outpatient Medications  Medication Sig Dispense Refill   ACTEMRA  ACTPEN 162 MG/0.9ML SOAJ Inject 0.9 mLs into the skin once a week. 10.8 mL 1   ascorbic acid  (VITAMIN C) 1000 MG tablet Take 1 tablet by mouth daily.     cholecalciferol  (VITAMIN D3) 25 MCG (1000 UNIT) tablet Take 5,000 Units by mouth daily.     levocetirizine (XYZAL ) 5 MG tablet Take 1 tablet (5 mg total) by mouth every evening. 90 tablet 0   levothyroxine  (SYNTHROID ) 88 MCG tablet TAKE 1 TABLET BY MOUTH DAILY 90 tablet 3   MAGNESIUM GLYCINATE PO Take by mouth.     Olopatadine-Mometasone (RYALTRIS ) 665-25 MCG/ACT SUSP 1-2 sprays per nostril twice a day 87 g 3   Probiotic Product (CULTRELLE KIDS IMMUNE DEFENSE PO) Take 1 tablet by mouth daily.     UNABLE TO FIND Take 4 tablets by mouth daily. Med Name: Omega Pure 2 tablet in AM and 1 tablet in PM     UNABLE TO FIND Take by mouth daily. Med Name: Ultra Inflamix 2 scoops daily     UNABLE TO FIND Take 2 tablets by mouth daily. Med Name: R-Lipoic Acid     No current facility-administered medications for this visit.   No results found.  Review of Systems:   A ROS was performed including pertinent positives and negatives as documented in the HPI.  Physical Exam :   Constitutional: NAD and appears stated age Neurological: Alert and oriented Psych: Appropriate affect and cooperative Last menstrual period 12/23/2021.  Comprehensive Musculoskeletal Exam:    Exam of the left shoulder demonstrates mild tenderness over the lateral deltoid but otherwise grossly nontender.  Active range of motion to 140 degrees flexion, 80 degrees external rotation, and internal rotation to L2.  Positive Neer and Hawkins tests.  Negative empty can and liftoff. Fluid passive left hip  range of motion 120 degrees flexion, 30 degrees external rotation, 20 degrees internal rotation.  Negative FABER and FADIR.  No tenderness over the greater trochanter.  Imaging:   Xray (left shoulder 3 views): Negative radiographs of the left shoulder.  Xray (AP pelvis, left hip 3 views): Negative for acute bony abnormalities.  Bilateral hip joint spaces are well-maintained.   I personally reviewed and interpreted the radiographs.      Assessment & Plan Left shoulder impingement syndrome   Likely caused by rotator cuff inflammation, with X-rays showing no significant arthritis or bone spurs and no evidence on exam of a rotator cuff tear. Symptoms align with impingement. Continue rotator cuff strengthening exercises using bands or light weights. Consider a short course of oral anti-inflammatory medication to evaluate response. Discuss the possibility of a shoulder injection if symptoms significantly disrupt daily activities.  Left hip pain Patient experiences chronic left hip pain which presents more laterally.  Discussed that this often aligns with greater trochanteric pain syndrome although she does not have any pinpoint tenderness in this area no weakness with abduction.  Continue conservative management with anti-inflammatories. Consider physical therapy if symptoms persist or worsen. Discuss the potential for an injection if conservative measures are ineffective.      I personally saw and evaluated the patient, and participated in the management and treatment plan.  Leonce Reveal, PA-C Orthopedics

## 2024-03-20 ENCOUNTER — Encounter: Payer: Self-pay | Admitting: Gastroenterology

## 2024-03-21 NOTE — Progress Notes (Signed)
 ____________________________________________________________  Attending physician addendum:  Thank you for sending this case to me. I have reviewed the entire note and agree with the plan.  Sure thing, glad I could be available soon.  Victory Brand, MD  ____________________________________________________________

## 2024-03-22 ENCOUNTER — Encounter (INDEPENDENT_AMBULATORY_CARE_PROVIDER_SITE_OTHER): Payer: Self-pay | Admitting: Nurse Practitioner

## 2024-03-22 ENCOUNTER — Other Ambulatory Visit (HOSPITAL_BASED_OUTPATIENT_CLINIC_OR_DEPARTMENT_OTHER): Payer: Self-pay

## 2024-03-22 ENCOUNTER — Ambulatory Visit (INDEPENDENT_AMBULATORY_CARE_PROVIDER_SITE_OTHER): Admitting: Nurse Practitioner

## 2024-03-22 VITALS — BP 99/66 | HR 87 | Temp 98.7°F | Ht 65.0 in | Wt 173.0 lb

## 2024-03-22 DIAGNOSIS — E7800 Pure hypercholesterolemia, unspecified: Secondary | ICD-10-CM

## 2024-03-22 DIAGNOSIS — Z6828 Body mass index (BMI) 28.0-28.9, adult: Secondary | ICD-10-CM

## 2024-03-22 DIAGNOSIS — E88819 Insulin resistance, unspecified: Secondary | ICD-10-CM

## 2024-03-22 DIAGNOSIS — E559 Vitamin D deficiency, unspecified: Secondary | ICD-10-CM

## 2024-03-22 MED ORDER — METFORMIN HCL ER 500 MG PO TB24
500.0000 mg | ORAL_TABLET | Freq: Every day | ORAL | 0 refills | Status: DC
  Filled 2024-03-22 (×2): qty 30, 30d supply, fill #0

## 2024-03-22 NOTE — Progress Notes (Signed)
 Office: [phone]  /  Fax: [phone]  WEIGHT SUMMARY AND BIOMETRICS  Weight Lost Since Last Visit: 1lb  Weight Gained Since Last Visit: 0lb   Vitals Temp: 98.7 F (37.1 C) BP: 99/66 Pulse Rate: 87 SpO2: 99 %   Anthropometric Measurements Height: 5' 5 (1.651 m) Weight: 173 lb (78.5 kg) BMI (Calculated): 28.79 Weight at Last Visit: 174lb Weight Lost Since Last Visit: 1lb Weight Gained Since Last Visit: 0lb Starting Weight: 188lb Total Weight Loss (lbs): 15 lb (6.804 kg) Peak Weight: 195lb   Body Composition  Body Fat %: 39.5 % Fat Mass (lbs): 68.4 lbs Muscle Mass (lbs): 99.2 lbs Total Body Water (lbs): 71.6 lbs Visceral Fat Rating : 9   Other Clinical Data Fasting: No Labs: No Today's Visit #: 6 Starting Date: 01/05/24    Total Weight Loss: 15 pounds  Percent of body weight lost:7.9% Bio Impedance Data reviewed with patient: Down 1.4 pounds of muscle and up 0.4 pound of adipose  HPI  Chief Complaint: OBESITY  Valerie [name] is here to discuss her progress with her obesity treatment plan. She is on the the Category 1 Plan and states she is following her eating plan approximately 95 % of the time. She states she is exercising 45 minutes 5 days per week.   Interval History:  Since last office visit she had stopped her ultrainflammx shake and has noticed increased inflammation in her joints and GI also said inflammation so has restarted. She has been trying to get her protein through food sources versus shakes/protein bars. She has added cottage cheese and protein powder to muffins and pancakes. She has been getting between 836 to 1323 calories a day. 72-123 grams of protein.   She has not been getting as many fruits and vegetables. She is frustrated with getting only 100 calories and trying to get enough protein. She is getting upset with not gaining muscle but losing. She has only been getting her heart rate to 100-110 with her her aerobic activity.  She is  drinking water approximately 48 ounces of water a day.  She does drink hot tea occasionally.   She does have insulin  resistance with fasting insulin  of 13.2 with HOMA-IR score of 2.73. Discussion regarding Metformin  and its potential to help insulin  resistance and weight loss  She continues on Cholecalciferol  5000 units daily for Vit D deficiency and last lab was in therapeutic range.  She has pure hypercholesterolemia and continues to work on nutrition, exercise and weight loss to improve.   PHYSICAL EXAM:  Blood pressure 99/66, pulse 87, temperature 98.7 F (37.1 C), height 5' 5 (1.651 m), weight 173 lb (78.5 kg), last menstrual period 12/23/2021, SpO2 99%. Body mass index is 28.79 kg/m.  General: Well Developed, well nourished, and in no acute distress.  HEENT: Normocephalic, atraumatic; EOMI, sclerae are anicteric. Skin: Warm and dry, good turgor Chest:  Normal excursion, shape, no gross ABN Respiratory: No conversational dyspnea; speaking in full sentences NeuroM-Sk:  Normal gross ROM * 4 extremities  Psych: A and O X 3, insight adequate, mood- full    DIAGNOSTIC DATA REVIEWED:  BMET    Component Value Date/Time   NA CANCELED 01/05/2024 0947   K CANCELED 01/05/2024 0947   CL CANCELED 01/05/2024 0947   CO2 20 01/05/2024 0947   GLUCOSE 84 01/05/2024 0947   GLUCOSE 96 11/12/2023 0758   BUN 18 01/05/2024 0947   CREATININE 1.02 (H) 01/05/2024 0947   CREATININE 0.97 08/21/2023 1613   CALCIUM 9.0 01/05/2024  9052   GFRNONAA >60 08/29/2022 0945   GFRNONAA >60 11/08/2021 1016   Lab Results  Component Value Date   HGBA1C 5.5 01/05/2024   HGBA1C 5.4 04/25/2020   Lab Results  Component Value Date   INSULIN  13.2 01/05/2024   Lab Results  Component Value Date   TSH 0.750 01/05/2024   CBC    Component Value Date/Time   WBC 5.6 01/05/2024 0947   WBC 5.7 11/27/2023 0829   RBC 5.01 01/05/2024 0947   RBC 4.77 11/27/2023 0829   HGB 16.5 (H) 01/05/2024 0947   HCT 49.6  (H) 01/05/2024 0947   PLT 205 01/05/2024 0947   MCV 99 (H) 01/05/2024 0947   MCH 32.9 01/05/2024 0947   MCH 31.8 08/21/2023 1613   MCHC 33.3 01/05/2024 0947   MCHC 33.3 11/27/2023 0829   RDW 11.8 01/05/2024 0947   Iron Studies No results found for: IRON, TIBC, FERRITIN, IRONPCTSAT Lipid Panel     Component Value Date/Time   CHOL 188 08/21/2023 1613   CHOL 178 10/22/2022 0851   TRIG 114 08/21/2023 1613   HDL 56 08/21/2023 1613   HDL 56 10/22/2022 0851   CHOLHDL 3.4 08/21/2023 1613   VLDL 25.8 08/19/2022 1604   LDLCALC 110 (H) 08/21/2023 1613   Hepatic Function Panel     Component Value Date/Time   PROT 7.1 01/05/2024 0947   ALBUMIN 4.5 01/05/2024 0947   AST 28 01/05/2024 0947   AST 20 11/08/2021 1016   ALT 36 (H) 01/05/2024 0947   ALT 26 11/08/2021 1016   ALKPHOS 48 (L) 01/05/2024 0947   BILITOT 0.4 01/05/2024 0947   BILITOT 0.4 11/08/2021 1016   BILIDIR 0.11 06/11/2021 0806      Component Value Date/Time   TSH 0.750 01/05/2024 0947   Nutritional Lab Results  Component Value Date   VD25OH 63.0 01/05/2024   VD25OH 72.1 10/22/2022   VD25OH 34.5 05/07/2020     ASSESSMENT AND PLAN  Overweight with body mass index (BMI) of 28 to 28.9 in adult TREATMENT PLAN FOR OBESITY:  Recommended Dietary Goals  Valerie [name] is currently in the action stage of change. As such, her goal is to continue weight management plan. She has agreed to the Category 1 Plan.  Behavioral Intervention  We discussed the following Behavioral Modification Strategies today: celebration eating strategies, continue to work on maintaining a reduced calorie state, getting the recommended amount of protein, incorporating whole foods, making healthy choices, staying well hydrated and practicing mindfulness when eating., and increase protein intake, fibrous foods (25 grams per day for women, 30 grams for men) and water to improve satiety and decrease hunger signals. .  She wants to lose weight in  December - She does  recognize that she must follow a structured plan and will eat before social situation to meet her protein goals and minimze party foods - She will give away food gifts unless they are very low calories or high protein - She will avoid calorie-containing liquids such as holiday drinks, eggnog and alcohol -She will stick to her structured plan strictly most of the time - This strategy should help her lose 1-2 pounds in December   Recommended Physical Activity Goals  Lichelle has been advised to work up to 150 minutes of moderate intensity aerobic activity a week and strengthening exercises 2-3 times per week for cardiovascular health, weight loss maintenance and preservation of muscle mass.   She has agreed to Think about enjoyable ways to increase daily physical  activity and overcoming barriers to exercise, Increase physical activity in their day and reduce sedentary time (increase NEAT)., and Increase the intensity, frequency or duration of aerobic exercises     Pharmacotherapy We discussed various medication options to help Talasia with her weight loss efforts and we both agreed to start Metformin  XR 500 mg every day for insulin  resistance and off label for weight loss- counseled on side effects. .  ASSOCIATED CONDITIONS ADDRESSED TODAY  Action/Plan  Insulin  resistance Continue Category 1  meal plan, limit simple carbohydrates Start Metformin  XR 500 mg every day Continue to follow regularly with PCP Continue exercise with current goal of 150 minutes of moderate to high intensity exercise/week.  -     metFORMIN  HCl ER; Take 1 tablet (500 mg total) by mouth daily with breakfast.  Dispense: 30 tablet; Refill: 0  Vitamin D  deficiency Low vitamin D  levels can be associated with adiposity and may result in leptin resistance and weight gain. Also associated with fatigue.  Currently on vitamin D  supplementation(cholecalciferol  5000 units daily) without any adverse effects such  as nausea, vomiting or muscle weakness.     Pure hypercholesterolemia Focus on implementing category 1 meal plan, limit saturated fats Loss of 10-15% body weight can improve lipid levels Focus on getting 150 minutes a week of moderate to high intensity exercise           Return in about 3 weeks (around 04/12/2024).SABRA She was informed of the importance of frequent follow up visits to maximize her success with intensive lifestyle modifications for her multiple health conditions.   ATTESTASTION STATEMENTS:  Reviewed by clinician on day of visit: allergies, medications, problem list, medical history, surgical history, family history, social history, and previous encounter notes.   I personally spent a total of 40 minutes in the care of the patient today including preparing to see the patient, getting/reviewing separately obtained history, performing a medically appropriate exam/evaluation, counseling and educating, placing orders, and documenting clinical information in the EHR.   Johnice Riebe ANP-C

## 2024-03-23 ENCOUNTER — Encounter: Payer: Self-pay | Admitting: Gastroenterology

## 2024-03-23 ENCOUNTER — Other Ambulatory Visit (HOSPITAL_COMMUNITY): Payer: Self-pay

## 2024-03-23 ENCOUNTER — Ambulatory Visit: Admitting: Gastroenterology

## 2024-03-23 VITALS — BP 132/66 | HR 83 | Temp 97.4°F | Resp 20 | Ht 65.0 in | Wt 177.0 lb

## 2024-03-23 DIAGNOSIS — K449 Diaphragmatic hernia without obstruction or gangrene: Secondary | ICD-10-CM | POA: Diagnosis not present

## 2024-03-23 DIAGNOSIS — Q399 Congenital malformation of esophagus, unspecified: Secondary | ICD-10-CM | POA: Diagnosis not present

## 2024-03-23 DIAGNOSIS — K222 Esophageal obstruction: Secondary | ICD-10-CM

## 2024-03-23 DIAGNOSIS — R1319 Other dysphagia: Secondary | ICD-10-CM

## 2024-03-23 MED ORDER — SODIUM CHLORIDE 0.9 % IV SOLN: 500.0000 mL | INTRAVENOUS | Status: DC

## 2024-03-23 NOTE — Patient Instructions (Signed)

## 2024-03-23 NOTE — Progress Notes (Signed)
To pacu, Vss.Report to [email or web address].tb

## 2024-03-23 NOTE — Progress Notes (Signed)
 No significant changes to clinical history since GI office visit on 03/17/24.  The patient is appropriate for an endoscopic procedure in the ambulatory setting.  - Victory Brand, MD

## 2024-03-23 NOTE — Op Note (Signed)
 [facility] Patient Name: Valerie [name] Procedure Date: 03/23/2024 9:55 AM MRN: [id number] Endoscopist: Victory L. Legrand , MD, [id number] Age: 53 Referring MD:  Date of Birth: Jan 06, 1971 Gender: Female Account #: [id number] Procedure:                Upper GI endoscopy Indications:              Esophageal dysphagia, For therapy of esophageal                            stenosis Medicines:                Monitored Anesthesia Care Procedure:                Pre-Anesthesia Assessment:                           - Prior to the procedure, a History and Physical                            was performed, and patient medications and                            allergies were reviewed. The patient's tolerance of                            previous anesthesia was also reviewed. The risks                            and benefits of the procedure and the sedation                            options and risks were discussed with the patient.                            All questions were answered, and informed consent                            was obtained. Prior Anticoagulants: The patient has                            taken no anticoagulant or antiplatelet agents. ASA                            Grade Assessment: II - A patient with mild systemic                            disease. After reviewing the risks and benefits,                            the patient was deemed in satisfactory condition to                            undergo the procedure.  After obtaining informed consent, the endoscope was                            passed under direct vision. Throughout the                            procedure, the patient's blood pressure, pulse, and                            oxygen saturations were monitored continuously. The                            Olympus Scope SN [id number] was introduced through the                            mouth, and advanced to the second part of  duodenum.                            The upper GI endoscopy was accomplished without                            difficulty. The patient tolerated the procedure                            well. Scope In: Scope Out: Findings:                 The larynx was normal.                           A 3 cm (approximately) hiatal hernia was present.                            There was a probable paraesophageal component, most                            evident on retroflexion.                           One benign-appearing, intrinsic mild stenosis was                            found at the gastroesophageal junction. Somewhat                            difficult to visualize due to motility as well as                            tortuosity of the most distal esophagus related to                            the hiatal hernia. This stenosis measured less than                            one cm (in length). The  stenosis was traversed. A                            guidewire was placed and the scope was withdrawn.                            Dilation was performed with a Savary dilator with                            moderate resistance at 17 mm. The dilation site was                            examined and showed mild mucosal disruption and                            mild improvement in luminal narrowing.                           The exam of the esophagus was otherwise normal. No                            esophagitis or other mucosal changes. Active                            motility seen, no luminal dilatation.                           The stomach was normal.                           The cardia and gastric fundus were normal on                            retroflexion.                           The examined duodenum was normal. Complications:            No immediate complications. Estimated Blood Loss:     Estimated blood loss was minimal. Impression:               - Normal larynx.                            - 3 cm hiatal hernia.                           - Benign-appearing esophageal stenosis. Dilated.                           - Normal stomach.                           - Normal examined duodenum.                           - No specimens collected.  The distal esophageal tortuosity related to the                            hiatal hernia appears to be a greater contributing                            factor to the dysphagia than the actual degree of                            narrowing from the visualized stenosis. Recommendation:           - Patient has a contact number available for                            emergencies. The signs and symptoms of potential                            delayed complications were discussed with the                            patient. Return to normal activities tomorrow.                            Written discharge instructions were provided to the                            patient.                           - Resume previous diet.                           - Continue present medications.                           - Return to office as needed to see Dr Federico Shove L. Legrand, MD 03/23/2024 10:30:10 AM This report has been signed electronically.

## 2024-03-23 NOTE — Progress Notes (Signed)
 Called to room to assist during endoscopic procedure.  Patient ID and intended procedure confirmed with present staff. Received instructions for my participation in the procedure from the performing physician.

## 2024-03-24 ENCOUNTER — Telehealth: Payer: Self-pay

## 2024-03-24 NOTE — Telephone Encounter (Signed)
°  Follow up Call-     03/23/2024    9:12 AM 01/08/2022    8:25 AM 09/06/2021    7:34 AM  Call back number  Post procedure Call Back phone  # [phone]  Permission to leave phone message Yes Yes Yes     Patient questions:  Do you have a fever, pain , or abdominal swelling? No. Pain Score  0 *  Have you tolerated food without any problems? Yes.    Have you been able to return to your normal activities? Yes.    Do you have any questions about your discharge instructions: Diet   No. Medications  No. Follow up visit  No.  Do you have questions or concerns about your Care? No.  Actions: * If pain score is 4 or above: No action needed, pain <4.

## 2024-03-30 ENCOUNTER — Encounter (INDEPENDENT_AMBULATORY_CARE_PROVIDER_SITE_OTHER): Payer: Self-pay | Admitting: Nurse Practitioner

## 2024-03-31 NOTE — Telephone Encounter (Signed)
 As noted in EGD report, I believe the hiatal hernia (which is a little more prominent than it was on the last EGD) and the resultant change to the lower esophageal anatomy/shape are greater contributing factors to the dysphagia than the mild stricture that was dilated.  As such, this will be a more challenging issue to resolve than just with endoscopic dilation.  My intention was that she have a discussion with Dr. Federico about that after she is back to the office.  I have no open clinic time by the end of the year.  However, if Demiya think she may want to consider surgery for the hiatal hernia, then my recommendation is a consultation with Dr. Shyrl of thoracic surgery.  Dr. Legrand

## 2024-04-12 ENCOUNTER — Ambulatory Visit (INDEPENDENT_AMBULATORY_CARE_PROVIDER_SITE_OTHER): Admitting: Nurse Practitioner

## 2024-04-12 ENCOUNTER — Encounter (INDEPENDENT_AMBULATORY_CARE_PROVIDER_SITE_OTHER): Payer: Self-pay | Admitting: Nurse Practitioner

## 2024-04-12 VITALS — BP 112/75 | HR 73 | Temp 97.9°F | Ht 65.0 in | Wt 170.0 lb

## 2024-04-12 DIAGNOSIS — Z6828 Body mass index (BMI) 28.0-28.9, adult: Secondary | ICD-10-CM

## 2024-04-12 DIAGNOSIS — E063 Autoimmune thyroiditis: Secondary | ICD-10-CM

## 2024-04-12 DIAGNOSIS — E88819 Insulin resistance, unspecified: Secondary | ICD-10-CM | POA: Diagnosis not present

## 2024-04-12 DIAGNOSIS — E663 Overweight: Secondary | ICD-10-CM | POA: Diagnosis not present

## 2024-04-12 DIAGNOSIS — E559 Vitamin D deficiency, unspecified: Secondary | ICD-10-CM

## 2024-04-12 MED ORDER — METFORMIN HCL ER 500 MG PO TB24: 500.0000 mg | ORAL_TABLET | Freq: Every day | ORAL | 0 refills | Status: AC

## 2024-04-12 NOTE — Progress Notes (Signed)
 " Office: [phone]  /  Fax: [phone]  WEIGHT SUMMARY AND BIOMETRICS  Weight Lost Since Last Visit: 3 lb  Weight Gained Since Last Visit: 0   Vitals Temp: 97.9 F (36.6 C) BP: 112/75 Pulse Rate: 73 SpO2: 97 %   Anthropometric Measurements Height: 5' 5 (1.651 m) Weight: 170 lb (77.1 kg) BMI (Calculated): 28.29 Weight at Last Visit: 173 lb Weight Lost Since Last Visit: 3 lb Weight Gained Since Last Visit: 0 Starting Weight: 188 lb Total Weight Loss (lbs): 18 lb (8.165 kg)   Body Composition  Body Fat %: 38.3 % Fat Mass (lbs): 65.2 lbs Muscle Mass (lbs): 99.4 lbs Total Body Water (lbs): 68.4 lbs Visceral Fat Rating : 9   Other Clinical Data Fasting: no Labs: no Today's Visit #: 7 Starting Date: 01/05/24    Total Weight Loss: 18 pounds Percent of body weight lost: 9.6%   Bio Impedance Data reviewed with patient: Muscle is up 0.2 pounds, adipose is down 3.2 pounds.  HPI  Chief Complaint: OBESITY  Valerie [name] is here to discuss her progress with her obesity treatment plan. She is on the the Category 1 Plan and states she is following her eating plan approximately 90 % of the time. She states she is exercising 45-60 minutes 5 days per week.   Interval History:  Since last office visit she has continued to use Metformin  XR 500 mg every day for insulin  resistance and denies side effects with the medication. She is taking it at dinner.  She has been trying to get 100 grams of protein. She is getting enough water at least 64 ounces daily. She has not been skipping any meals.  She ate smart over Christmas.  She has increased her cardio to make sure her heart rate is in 120-140 range. Has increased weight with strength training.  She did measurements on 9/28 and 04/11/24 Waist 40 to 38 inches, R thigh 23 now 20.75, Bust 42 now 39   She had an endoscopy day after last visit. She does have hiatal hernia that has gotten worse- she has constant heartburn even after  dilation during Endoscopy. She may be having a fundoplication.   She continues to take cholecalciferol  5000 units daily for Vit D defand is in therapeutic range, no side effects.  Last vitamin D  Lab Results  Component Value Date   VD25OH 63.0 01/05/2024    She continues on Levothyroxine  88 mcg daily for hypothyroidism and denies side effects. Last TSH in therapeutic range Lab Results  Component Value Date   TSH 0.750 01/05/2024   FREET4 1.1 03/09/2023      PHYSICAL EXAM:  Blood pressure 112/75, pulse 73, temperature 97.9 F (36.6 C), height 5' 5 (1.651 m), weight 170 lb (77.1 kg), last menstrual period 12/23/2021, SpO2 97%. Body mass index is 28.29 kg/m.  General: Well Developed, well nourished, and in no acute distress.  HEENT: Normocephalic, atraumatic; EOMI, sclerae are anicteric. Skin: Warm and dry, good turgor Chest:  Normal excursion, shape, no gross ABN Respiratory: No conversational dyspnea; speaking in full sentences NeuroM-Sk:  Normal gross ROM * 4 extremities  Psych: A and O X 3, insight adequate, mood- full    DIAGNOSTIC DATA REVIEWED:  BMET    Component Value Date/Time   NA CANCELED 01/05/2024 0947   K CANCELED 01/05/2024 0947   CL CANCELED 01/05/2024 0947   CO2 20 01/05/2024 0947   GLUCOSE 84 01/05/2024 0947   GLUCOSE 96 11/12/2023 0758   BUN 18 01/05/2024  0947   CREATININE 1.02 (H) 01/05/2024 0947   CREATININE 0.97 08/21/2023 1613   CALCIUM 9.0 01/05/2024 0947   GFRNONAA >60 08/29/2022 0945   GFRNONAA >60 11/08/2021 1016   Lab Results  Component Value Date   HGBA1C 5.5 01/05/2024   HGBA1C 5.4 04/25/2020   Lab Results  Component Value Date   INSULIN  13.2 01/05/2024   Lab Results  Component Value Date   TSH 0.750 01/05/2024   CBC    Component Value Date/Time   WBC 5.6 01/05/2024 0947   WBC 5.7 11/27/2023 0829   RBC 5.01 01/05/2024 0947   RBC 4.77 11/27/2023 0829   HGB 16.5 (H) 01/05/2024 0947   HCT 49.6 (H) 01/05/2024 0947   PLT  205 01/05/2024 0947   MCV 99 (H) 01/05/2024 0947   MCH 32.9 01/05/2024 0947   MCH 31.8 08/21/2023 1613   MCHC 33.3 01/05/2024 0947   MCHC 33.3 11/27/2023 0829   RDW 11.8 01/05/2024 0947   Iron Studies No results found for: IRON, TIBC, FERRITIN, IRONPCTSAT Lipid Panel     Component Value Date/Time   CHOL 188 08/21/2023 1613   CHOL 178 10/22/2022 0851   TRIG 114 08/21/2023 1613   HDL 56 08/21/2023 1613   HDL 56 10/22/2022 0851   CHOLHDL 3.4 08/21/2023 1613   VLDL 25.8 08/19/2022 1604   LDLCALC 110 (H) 08/21/2023 1613   Hepatic Function Panel     Component Value Date/Time   PROT 7.1 01/05/2024 0947   ALBUMIN 4.5 01/05/2024 0947   AST 28 01/05/2024 0947   AST 20 11/08/2021 1016   ALT 36 (H) 01/05/2024 0947   ALT 26 11/08/2021 1016   ALKPHOS 48 (L) 01/05/2024 0947   BILITOT 0.4 01/05/2024 0947   BILITOT 0.4 11/08/2021 1016   BILIDIR 0.11 06/11/2021 0806      Component Value Date/Time   TSH 0.750 01/05/2024 0947   Nutritional Lab Results  Component Value Date   VD25OH 63.0 01/05/2024   VD25OH 72.1 10/22/2022   VD25OH 34.5 05/07/2020     ASSESSMENT AND PLAN Overweight with body mass index (BMI) of 28 to 28.9 in adult TREATMENT PLAN FOR OBESITY:  Recommended Dietary Goals  Frimet is currently in the action stage of change. As such, her goal is to continue weight management plan. She has agreed to the Category 1 Plan.  Behavioral Intervention  We discussed the following Behavioral Modification Strategies today: increasing lean protein intake to established goals, increasing fiber rich foods, avoiding skipping meals, increasing water intake , continue to work on maintaining a reduced calorie state, getting the recommended amount of protein, incorporating whole foods, making healthy choices, staying well hydrated and practicing mindfulness when eating., and increase protein intake, fibrous foods (25 grams per day for women, 30 grams for men) and water to  improve satiety and decrease hunger signals. .   Recommended Physical Activity Goals  Kaprice has been advised to work up to 150 minutes of moderate intensity aerobic activity a week and strengthening exercises 2-3 times per week for cardiovascular health, weight loss maintenance and preservation of muscle mass.   She has agreed to Continue current level of physical activity  and Combine aerobic and strengthening exercises for efficiency and improved cardiometabolic health.   Pharmacotherapy We discussed various medication options to help Tambria with her weight loss efforts and we both agreed to continue Metformin  XR 500 mg every day for insulin  resistance and off label for weight loss.  Denies side effects with the  medication.  ASSOCIATED CONDITIONS ADDRESSED TODAY  Action/Plan  Insulin  resistance Continue Category 1  meal plan, limit simple carbohydrates.  Increased lean protein, fiber and water Continue to take Metformin  XR 500 mg every day- denies side effects Continue exercise with current goal of 240 minutes of moderate to high intensity exercise/week with strength training.  -     metFORMIN  HCl ER; Take 1 tablet (500 mg total) by mouth daily with breakfast.  Dispense: 90 tablet; Refill: 0  Vitamin D  deficiency     Low vitamin D  levels can be associated with adiposity and may result in leptin resistance and weight gain. Also associated with fatigue.      Currently on vitamin D  supplementation without any adverse effects such as nausea, vomiting or muscle weakness.     Hypothyroidism due to Hashimoto's thyroiditis       Continue Levothyroxine  88 mcg daily       Monitor symptoms and continue to follow with PCP regularly          Return in about 4 weeks (around 05/10/2024).SABRA She was informed of the importance of frequent follow up visits to maximize her success with intensive lifestyle modifications for her multiple health conditions.   ATTESTASTION STATEMENTS:  Reviewed by  clinician on day of visit: allergies, medications, problem list, medical history, surgical history, family history, social history, and previous encounter notes.     Lonell Liverpool ANP-C "

## 2024-04-14 ENCOUNTER — Encounter: Payer: Self-pay | Admitting: Neurology

## 2024-04-14 DIAGNOSIS — G3781 Myelin oligodendrocyte glycoprotein antibody disease: Secondary | ICD-10-CM

## 2024-04-18 MED ORDER — ACTEMRA ACTPEN 162 MG/0.9ML ~~LOC~~ SOAJ: 0.9000 mL | SUBCUTANEOUS | 1 refills | Status: AC

## 2024-05-03 ENCOUNTER — Ambulatory Visit (INDEPENDENT_AMBULATORY_CARE_PROVIDER_SITE_OTHER): Admitting: Family Medicine

## 2024-05-03 ENCOUNTER — Encounter (INDEPENDENT_AMBULATORY_CARE_PROVIDER_SITE_OTHER): Payer: Self-pay | Admitting: Family Medicine

## 2024-05-03 ENCOUNTER — Other Ambulatory Visit: Payer: Self-pay | Admitting: General Surgery

## 2024-05-03 VITALS — BP 105/71 | HR 69 | Temp 97.8°F | Ht 65.0 in | Wt 168.0 lb

## 2024-05-03 DIAGNOSIS — Z6827 Body mass index (BMI) 27.0-27.9, adult: Secondary | ICD-10-CM

## 2024-05-03 DIAGNOSIS — E559 Vitamin D deficiency, unspecified: Secondary | ICD-10-CM | POA: Diagnosis not present

## 2024-05-03 DIAGNOSIS — Z6831 Body mass index (BMI) 31.0-31.9, adult: Secondary | ICD-10-CM

## 2024-05-03 DIAGNOSIS — E669 Obesity, unspecified: Secondary | ICD-10-CM | POA: Diagnosis not present

## 2024-05-03 DIAGNOSIS — E88819 Insulin resistance, unspecified: Secondary | ICD-10-CM

## 2024-05-03 DIAGNOSIS — K219 Gastro-esophageal reflux disease without esophagitis: Secondary | ICD-10-CM

## 2024-05-03 NOTE — Progress Notes (Signed)
 "  Valerie [name], D.O.  ABFM, ABOM Specializing in Clinical Bariatric Medicine  Office located at: [address] [place], [place]  [address]     Obtain Labs at next OV.      FOR THE CHRONIC DISEASE OF OBESITY:  Chief complaint: Obesity Valerie [name] is here to discuss her progress with her obesity treatment plan.   History of present illness / Interval history:  Valerie [name] is here today for her follow-up office visit.  Since last OV on 04/12/24 with provider: Lonell, patient has been away for work.   Recent challenges/ barriers to successful adherence of program recommendations:   had a recent lapse due to vacationing or traveling  Pt has been struggling with:  []  meal planning and prepping [x]  exercise []  sleep []  stressors []  mood []  chronic or acute medical conditions-  []  eating out more []  Nothing, doing great  Pt has been working on and improving their:  []  meal planning and prepping [x]  exercise []  sleep hygiene [x]  water intake []  strategies to better manage personal stressors []  strategies to better manage mood []  eating out less []  Nothing particular at this time  When asked by CMA prior to our office visit today, pt states they have been:  - Focused on eating fresh, unprocessed foods?  Yes   - Focused on eating lean proteins with each meal?  Yes   - Sleeping 7-9 Hours/ Night?  Yes   - Skipping Meals?  No   - States she is following her healthy eating plan approximately 95 % of the time.   Recent weight loss data history   04/12/24 11:00 05/03/24 15:00   Body Fat % 38.3 % 38.3 %  Muscle Mass (lbs) 99.4 lbs 99 lbs  Fat Mass (lbs) 65.2 lbs 64.6 lbs  Total Body Water (lbs) 68.4 lbs 71.2 lbs  Visceral Fat Rating  9 9    Counseling done on how various foods/ behaviors will affect these numbers and how to maximize weight loss success discussed today in detail based on these findings.  Total lbs lost to date: - 20 lbs Total Fat Mass in lbs  lost to date: - 11.2 lbs Total weight loss percentage to date since starting program:  - 10.64 %    Obesity (BMI) of 31.0 to 31.9 in adult, start 31.28 Obesity,adult current BMI 27.96  Physician directed Nutrition Therapy prescription: She is on the Category 1 Plan.    Valerie [name] is currently in the action stage of change.   She has agreed to: continue current reduced-calorie meal plan    Physician directed Behavioral Modification prescription: Evidence-based interventions for healthy behavior change were utilized today including the discussion of small changes and SMART goals.  barriers to successful adherence to behavorial change for wt loss: not enough time  We discussed the following today: increasing lean protein intake to established goals and continue to work on maintaining a reduced calorie state, getting the recommended amount of protein, incorporating whole foods, making healthy choices, staying well hydrated and practicing mindfulness when eating.    Physician directed Physical Activity prescription: [name] is doing cardio or weights 45  minutes 5 days per week  barriers to successful adherence to exercise for wt loss: was away for work.  [place] has been educated on explained to patient her max heart rate and how it benefits her and how to calculate it.  - Handout on Characteristics of Successful Weight Losers and Weight Maintainers.   Exercise  prescription: She has agreed to Continue current level of physical activity     Medical Interventions/ Pharmacotherapy  Previously tried medical weight loss medications/ therapies: None  Pharmacotherapy for weight loss: She is currently taking Metformin  XR 500 mg daily for medical weight loss.    We discussed various medication options to help Valerie [name] with her weight loss efforts and we both agreed to:  Adequate clinical response to anti-obesity medication, continue current anti-obesity regimen  SPECIFIC behavorial /  nutritional / exercise goals for next office visit:  plans to create a supportive home environment and get rid of high calorie, low nutrient dense foods in home  B) OBESITY RELATED CONDITIONS ADDRESSED TODAY:  Insulin  resistance Assessment & Plan Lab Results  Component Value Date   HGBA1C 5.5 01/05/2024   HGBA1C 5.5 08/21/2023   HGBA1C 5.4 08/19/2022   INSULIN  13.2 01/05/2024    Insulin  Resistance Condition stable, on medical therapy, reviewed how simple carbs affects her insulin  levels, On metformin . Patient reports that her hunger and cravings are well controlled. Will obtain labs at next OV.     Vitamin D  deficiency Assessment & Plan Lab Results  Component Value Date   VD25OH 63.0 01/05/2024   VD25OH 72.1 10/22/2022   VD25OH 34.5 05/07/2020   Taking OTC Vit D3 5000 units daily with reported good compliance and tolerance. No acute concerns today. Continue with supplementation. Will obtain labs at next OV.     Follow up:   Return 05/24/2024 at 7:30 AM   She was informed of the importance of frequent follow up visits to maximize her success with intensive lifestyle modifications for her multiple health conditions.   Weight Summary and Biometrics   Weight Lost Since Last Visit: 2lb  Weight Gained Since Last Visit: 0lb   Vitals Temp: 97.8 F (36.6 C) BP: 105/71 Pulse Rate: 69 SpO2: 95 %   Anthropometric Measurements Height: 5' 5 (1.651 m) Weight: 168 lb (76.2 kg) BMI (Calculated): 27.96 Weight at Last Visit: 170lb Weight Lost Since Last Visit: 2lb Weight Gained Since Last Visit: 0lb Starting Weight: 188lb Total Weight Loss (lbs): 20 lb (9.072 kg)   Body Composition  Body Fat %: 38.3 % Fat Mass (lbs): 64.6 lbs Muscle Mass (lbs): 99 lbs Total Body Water (lbs): 71.2 lbs Visceral Fat Rating : 9   Other Clinical Data Fasting: no Labs: no Today's Visit #: 8 Starting Date: 01/05/24    Objective:   PHYSICAL EXAM: Blood pressure 105/71, pulse 69,  temperature 97.8 F (36.6 C), height 5' 5 (1.651 m), weight 168 lb (76.2 kg), last menstrual period 12/23/2021, SpO2 95%. Body mass index is 27.96 kg/m. General: she is overweight, cooperative and in no acute distress. PSYCH: Has normal mood, affect and thought process.   HEENT: EOMI, sclerae are anicteric. Lungs: Normal breathing effort, no conversational dyspnea. Extremities: Moves * 4 Neurologic: A and O * 3, good insight  DIAGNOSTIC DATA REVIEWED: BMET    Component Value Date/Time   NA CANCELED 01/05/2024 0947   K CANCELED 01/05/2024 0947   CL CANCELED 01/05/2024 0947   CO2 20 01/05/2024 0947   GLUCOSE 84 01/05/2024 0947   GLUCOSE 96 11/12/2023 0758   BUN 18 01/05/2024 0947   CREATININE 1.02 (H) 01/05/2024 0947   CREATININE 0.97 08/21/2023 1613   CALCIUM 9.0 01/05/2024 0947   GFRNONAA >60 08/29/2022 0945   GFRNONAA >60 11/08/2021 1016   Lab Results  Component Value Date   HGBA1C 5.5 01/05/2024   HGBA1C 5.4 04/25/2020  Lab Results  Component Value Date   INSULIN  13.2 01/05/2024   Lab Results  Component Value Date   TSH 0.750 01/05/2024   CBC    Component Value Date/Time   WBC 5.6 01/05/2024 0947   WBC 5.7 11/27/2023 0829   RBC 5.01 01/05/2024 0947   RBC 4.77 11/27/2023 0829   HGB 16.5 (H) 01/05/2024 0947   HCT 49.6 (H) 01/05/2024 0947   PLT 205 01/05/2024 0947   MCV 99 (H) 01/05/2024 0947   MCH 32.9 01/05/2024 0947   MCH 31.8 08/21/2023 1613   MCHC 33.3 01/05/2024 0947   MCHC 33.3 11/27/2023 0829   RDW 11.8 01/05/2024 0947   Iron Studies No results found for: IRON, TIBC, FERRITIN, IRONPCTSAT Lipid Panel     Component Value Date/Time   CHOL 188 08/21/2023 1613   CHOL 178 10/22/2022 0851   TRIG 114 08/21/2023 1613   HDL 56 08/21/2023 1613   HDL 56 10/22/2022 0851   CHOLHDL 3.4 08/21/2023 1613   VLDL 25.8 08/19/2022 1604   LDLCALC 110 (H) 08/21/2023 1613   Hepatic Function Panel     Component Value Date/Time   PROT 7.1 01/05/2024  0947   ALBUMIN 4.5 01/05/2024 0947   AST 28 01/05/2024 0947   AST 20 11/08/2021 1016   ALT 36 (H) 01/05/2024 0947   ALT 26 11/08/2021 1016   ALKPHOS 48 (L) 01/05/2024 0947   BILITOT 0.4 01/05/2024 0947   BILITOT 0.4 11/08/2021 1016   BILIDIR 0.11 06/11/2021 0806      Component Value Date/Time   TSH 0.750 01/05/2024 0947   Nutritional Lab Results  Component Value Date   VD25OH 63.0 01/05/2024   VD25OH 72.1 10/22/2022   VD25OH 34.5 05/07/2020    Attestations:   LILLETTE Sonny Laroche, acting as a stage manager for Valerie Jenkins, DO., have compiled all relevant documentation for today's office visit on behalf of Valerie Jenkins, DO, while in the presence of [employer], DO.  I have reviewed the above documentation for accuracy and completeness, and I agree with the above. Valerie JINNY [name], D.O.  The 21st Century Cures Act was signed into law in 2016 which includes the topic of electronic health records.  This provides immediate access to information in MyChart.  This includes consultation notes, operative notes, office notes, lab results and pathology reports.  If you have any questions about what you read please let us  know at your next visit so we can discuss your concerns and take corrective action if need be.  We are right here with you.  "

## 2024-05-11 ENCOUNTER — Encounter (INDEPENDENT_AMBULATORY_CARE_PROVIDER_SITE_OTHER): Payer: Self-pay | Admitting: Family Medicine

## 2024-05-16 ENCOUNTER — Inpatient Hospital Stay: Payer: Commercial Managed Care - PPO | Admitting: Hematology and Oncology

## 2024-05-24 ENCOUNTER — Ambulatory Visit (INDEPENDENT_AMBULATORY_CARE_PROVIDER_SITE_OTHER): Admitting: Nurse Practitioner

## 2024-06-01 ENCOUNTER — Inpatient Hospital Stay: Admitting: Adult Health

## 2024-06-02 ENCOUNTER — Other Ambulatory Visit

## 2024-06-15 ENCOUNTER — Ambulatory Visit (INDEPENDENT_AMBULATORY_CARE_PROVIDER_SITE_OTHER): Admitting: Family Medicine

## 2024-07-07 ENCOUNTER — Ambulatory Visit: Admitting: Neurology

## 2024-08-16 ENCOUNTER — Ambulatory Visit: Admitting: Neurology

## 2024-08-23 ENCOUNTER — Encounter: Admitting: Family Medicine

## 2024-12-08 ENCOUNTER — Ambulatory Visit: Admitting: Internal Medicine
